# Patient Record
Sex: Female | Born: 1950 | Race: White | Hispanic: No | Marital: Single | Smoking: Former smoker
Health system: Southern US, Community
[De-identification: ages and names within clinical notes are randomized; demographics above are authoritative.]

## PROBLEM LIST (undated history)

## (undated) DIAGNOSIS — M1711 Unilateral primary osteoarthritis, right knee: Secondary | ICD-10-CM

## (undated) DIAGNOSIS — T7840XA Allergy, unspecified, initial encounter: Secondary | ICD-10-CM

## (undated) DIAGNOSIS — F329 Major depressive disorder, single episode, unspecified: Secondary | ICD-10-CM

## (undated) DIAGNOSIS — J45909 Unspecified asthma, uncomplicated: Secondary | ICD-10-CM

## (undated) DIAGNOSIS — E119 Type 2 diabetes mellitus without complications: Secondary | ICD-10-CM

## (undated) DIAGNOSIS — I739 Peripheral vascular disease, unspecified: Secondary | ICD-10-CM

## (undated) DIAGNOSIS — J449 Chronic obstructive pulmonary disease, unspecified: Secondary | ICD-10-CM

## (undated) DIAGNOSIS — M1990 Unspecified osteoarthritis, unspecified site: Secondary | ICD-10-CM

## (undated) DIAGNOSIS — I10 Essential (primary) hypertension: Secondary | ICD-10-CM

## (undated) DIAGNOSIS — J189 Pneumonia, unspecified organism: Secondary | ICD-10-CM

## (undated) DIAGNOSIS — F32A Depression, unspecified: Secondary | ICD-10-CM

## (undated) DIAGNOSIS — E785 Hyperlipidemia, unspecified: Secondary | ICD-10-CM

## (undated) HISTORY — DX: Essential (primary) hypertension: I10

## (undated) HISTORY — DX: Unspecified osteoarthritis, unspecified site: M19.90

## (undated) HISTORY — PX: TONSILLECTOMY: SUR1361

## (undated) HISTORY — DX: Peripheral vascular disease, unspecified: I73.9

## (undated) HISTORY — PX: OTHER SURGICAL HISTORY: SHX169

## (undated) HISTORY — DX: Type 2 diabetes mellitus without complications: E11.9

## (undated) HISTORY — DX: Depression, unspecified: F32.A

## (undated) HISTORY — PX: FRACTURE SURGERY: SHX138

## (undated) HISTORY — DX: Major depressive disorder, single episode, unspecified: F32.9

## (undated) HISTORY — DX: Allergy, unspecified, initial encounter: T78.40XA

## (undated) HISTORY — DX: Hyperlipidemia, unspecified: E78.5

## (undated) HISTORY — DX: Chronic obstructive pulmonary disease, unspecified: J44.9

---

## 1998-05-27 ENCOUNTER — Emergency Department (HOSPITAL_COMMUNITY): Admission: EM | Admit: 1998-05-27 | Discharge: 1998-05-27 | Payer: Self-pay | Admitting: Emergency Medicine

## 2000-02-29 ENCOUNTER — Emergency Department (HOSPITAL_COMMUNITY): Admission: EM | Admit: 2000-02-29 | Discharge: 2000-02-29 | Payer: Self-pay | Admitting: Emergency Medicine

## 2000-03-06 ENCOUNTER — Emergency Department (HOSPITAL_COMMUNITY): Admission: EM | Admit: 2000-03-06 | Discharge: 2000-03-06 | Payer: Self-pay | Admitting: Emergency Medicine

## 2000-03-16 ENCOUNTER — Emergency Department (HOSPITAL_COMMUNITY): Admission: EM | Admit: 2000-03-16 | Discharge: 2000-03-16 | Payer: Self-pay | Admitting: Emergency Medicine

## 2000-03-16 ENCOUNTER — Encounter: Payer: Self-pay | Admitting: Emergency Medicine

## 2003-10-03 ENCOUNTER — Emergency Department (HOSPITAL_COMMUNITY): Admission: EM | Admit: 2003-10-03 | Discharge: 2003-10-03 | Payer: Self-pay | Admitting: Emergency Medicine

## 2010-07-01 ENCOUNTER — Emergency Department (HOSPITAL_COMMUNITY)
Admission: EM | Admit: 2010-07-01 | Discharge: 2010-07-01 | Payer: Self-pay | Source: Home / Self Care | Admitting: Emergency Medicine

## 2014-03-19 ENCOUNTER — Emergency Department (HOSPITAL_COMMUNITY): Payer: BC Managed Care – PPO

## 2014-03-19 ENCOUNTER — Observation Stay (HOSPITAL_COMMUNITY)
Admission: EM | Admit: 2014-03-19 | Discharge: 2014-03-20 | Disposition: A | Discharge status: Home / Self Care | Payer: BC Managed Care – PPO | Attending: Internal Medicine | Admitting: Internal Medicine

## 2014-03-19 ENCOUNTER — Encounter (HOSPITAL_COMMUNITY): Payer: Self-pay | Admitting: Emergency Medicine

## 2014-03-19 DIAGNOSIS — Z720 Tobacco use: Secondary | ICD-10-CM

## 2014-03-19 DIAGNOSIS — T380X5A Adverse effect of glucocorticoids and synthetic analogues, initial encounter: Secondary | ICD-10-CM

## 2014-03-19 DIAGNOSIS — R0989 Other specified symptoms and signs involving the circulatory and respiratory systems: Secondary | ICD-10-CM | POA: Diagnosis present

## 2014-03-19 DIAGNOSIS — R0789 Other chest pain: Principal | ICD-10-CM | POA: Insufficient documentation

## 2014-03-19 DIAGNOSIS — Z880 Allergy status to penicillin: Secondary | ICD-10-CM | POA: Insufficient documentation

## 2014-03-19 DIAGNOSIS — R110 Nausea: Secondary | ICD-10-CM | POA: Insufficient documentation

## 2014-03-19 DIAGNOSIS — Z87891 Personal history of nicotine dependence: Secondary | ICD-10-CM | POA: Diagnosis present

## 2014-03-19 DIAGNOSIS — R61 Generalized hyperhidrosis: Secondary | ICD-10-CM | POA: Insufficient documentation

## 2014-03-19 DIAGNOSIS — R079 Chest pain, unspecified: Secondary | ICD-10-CM | POA: Diagnosis present

## 2014-03-19 DIAGNOSIS — R42 Dizziness and giddiness: Secondary | ICD-10-CM | POA: Insufficient documentation

## 2014-03-19 DIAGNOSIS — F17200 Nicotine dependence, unspecified, uncomplicated: Secondary | ICD-10-CM | POA: Insufficient documentation

## 2014-03-19 DIAGNOSIS — R739 Hyperglycemia, unspecified: Secondary | ICD-10-CM

## 2014-03-19 DIAGNOSIS — Z7982 Long term (current) use of aspirin: Secondary | ICD-10-CM | POA: Insufficient documentation

## 2014-03-19 DIAGNOSIS — R0602 Shortness of breath: Secondary | ICD-10-CM | POA: Insufficient documentation

## 2014-03-19 DIAGNOSIS — E785 Hyperlipidemia, unspecified: Secondary | ICD-10-CM | POA: Diagnosis present

## 2014-03-19 DIAGNOSIS — E118 Type 2 diabetes mellitus with unspecified complications: Secondary | ICD-10-CM | POA: Diagnosis present

## 2014-03-19 DIAGNOSIS — E099 Drug or chemical induced diabetes mellitus without complications: Secondary | ICD-10-CM | POA: Diagnosis present

## 2014-03-19 DIAGNOSIS — E1169 Type 2 diabetes mellitus with other specified complication: Secondary | ICD-10-CM | POA: Diagnosis not present

## 2014-03-19 LAB — I-STAT TROPONIN, ED: TROPONIN I, POC: 0.01 ng/mL (ref 0.00–0.08)

## 2014-03-19 LAB — CBC WITH DIFFERENTIAL/PLATELET
BASOS ABS: 0 10*3/uL (ref 0.0–0.1)
Basophils Relative: 0 % (ref 0–1)
EOS ABS: 0.1 10*3/uL (ref 0.0–0.7)
Eosinophils Relative: 2 % (ref 0–5)
HCT: 42.2 % (ref 36.0–46.0)
Hemoglobin: 13.3 g/dL (ref 12.0–15.0)
LYMPHS ABS: 1.4 10*3/uL (ref 0.7–4.0)
Lymphocytes Relative: 28 % (ref 12–46)
MCH: 27 pg (ref 26.0–34.0)
MCHC: 31.5 g/dL (ref 30.0–36.0)
MCV: 85.8 fL (ref 78.0–100.0)
Monocytes Absolute: 0.3 10*3/uL (ref 0.1–1.0)
Monocytes Relative: 6 % (ref 3–12)
Neutro Abs: 3.3 10*3/uL (ref 1.7–7.7)
Neutrophils Relative %: 64 % (ref 43–77)
PLATELETS: 279 10*3/uL (ref 150–400)
RBC: 4.92 MIL/uL (ref 3.87–5.11)
RDW: 14.7 % (ref 11.5–15.5)
WBC: 5.1 10*3/uL (ref 4.0–10.5)

## 2014-03-19 LAB — BASIC METABOLIC PANEL
Anion gap: 13 (ref 5–15)
BUN: 7 mg/dL (ref 6–23)
CALCIUM: 9.2 mg/dL (ref 8.4–10.5)
CO2: 29 mEq/L (ref 19–32)
Chloride: 100 mEq/L (ref 96–112)
Creatinine, Ser: 0.59 mg/dL (ref 0.50–1.10)
GFR calc Af Amer: >90 mL/min (ref >90)
GLUCOSE: 109 mg/dL — AB (ref 70–99)
Potassium: 4.7 mEq/L (ref 3.7–5.3)
Sodium: 142 mEq/L (ref 137–147)

## 2014-03-19 LAB — MAGNESIUM: Magnesium: 2.2 mg/dL (ref 1.5–2.5)

## 2014-03-19 LAB — LIPID PANEL
CHOL/HDL RATIO: 5.4 ratio
CHOLESTEROL: 258 mg/dL — AB (ref 0–200)
HDL: 48 mg/dL (ref >39)
LDL Cholesterol: 187 mg/dL — ABNORMAL HIGH (ref 0–99)
Triglycerides: 116 mg/dL (ref <150)
VLDL: 23 mg/dL (ref 0–40)

## 2014-03-19 LAB — PHOSPHORUS: Phosphorus: 4.4 mg/dL (ref 2.3–4.6)

## 2014-03-19 LAB — CREATININE, SERUM
Creatinine, Ser: 0.62 mg/dL (ref 0.50–1.10)
GFR calc Af Amer: >90 mL/min (ref >90)
GFR calc non Af Amer: >90 mL/min (ref >90)

## 2014-03-19 LAB — TROPONIN I: Troponin I: <0.3 ng/mL (ref <0.30)

## 2014-03-19 LAB — TSH: TSH: 0.688 u[IU]/mL (ref 0.350–4.500)

## 2014-03-19 MED ORDER — ONDANSETRON HCL 4 MG/2ML IJ SOLN: 4.0000 mg | Freq: Four times a day (QID) | INTRAMUSCULAR | Status: DC | PRN

## 2014-03-19 MED ORDER — MORPHINE SULFATE 2 MG/ML IJ SOLN: 2.0000 mg | INTRAMUSCULAR | Status: DC | PRN

## 2014-03-19 MED ORDER — ASPIRIN EC 81 MG PO TBEC
81.0000 mg | DELAYED_RELEASE_TABLET | Freq: Every day | ORAL | Status: DC
  Administered 2014-03-20: 81 mg via ORAL
  Filled 2014-03-19: qty 1

## 2014-03-19 MED ORDER — ENOXAPARIN SODIUM 40 MG/0.4ML ~~LOC~~ SOLN
40.0000 mg | SUBCUTANEOUS | Status: DC
  Administered 2014-03-19: 40 mg via SUBCUTANEOUS
  Filled 2014-03-19 (×2): qty 0.4

## 2014-03-19 MED ORDER — ONDANSETRON HCL 4 MG/2ML IJ SOLN
4.0000 mg | Freq: Once | INTRAMUSCULAR | Status: AC
  Administered 2014-03-19: 4 mg via INTRAVENOUS
  Filled 2014-03-19: qty 2

## 2014-03-19 MED ORDER — ACETAMINOPHEN 325 MG PO TABS: 650.0000 mg | ORAL_TABLET | ORAL | Status: DC | PRN

## 2014-03-19 MED ORDER — NITROGLYCERIN 0.4 MG SL SUBL
0.4000 mg | SUBLINGUAL_TABLET | SUBLINGUAL | Status: DC | PRN
Start: 2014-03-19 — End: 2014-03-20
  Administered 2014-03-19: 0.4 mg via SUBLINGUAL
  Filled 2014-03-19: qty 1

## 2014-03-19 NOTE — Progress Notes (Signed)
Pt reported pain under her left breast. Rates it a 5 on 0/10 pain scale. 1 SL NTG given. Reports pain resolved.

## 2014-03-19 NOTE — ED Notes (Signed)
To room via EMS. Onset 1pm chest pain underneath left breast, no radiation.  Pt took ASA 325mg  @ 1pm and 325mg  @ 1:30pm.  NTG x 1 given en route pain down to 1/10.  BP 182/98 HR 70, RR 14.

## 2014-03-19 NOTE — Progress Notes (Signed)
Utilization Review Completed.Yvonne Todd T7/19/2015  

## 2014-03-19 NOTE — ED Notes (Signed)
Patient transported to X-ray 

## 2014-03-19 NOTE — H&P (Signed)
[facility] History and Physical  Yvonne LandLynda I Bohan ZOX:[id number] DOB: 07-07-1951 DOA: 03/19/2014  Referring physician:  PCP: No primary provider on file.  Specialists:   Chief Complaint: Chest pain  HPI: Yvonne Todd is a 63 y.o. female  With no past medical history, tobacco abuse that presents emergency department with complaints of chest pain. Patient has not seen in physician in quite some time. She does have an appointment set with one in November. Patient began having chest pain approximately 1 PM today which was associated with some shortness of breath, dizziness, diaphoresis, nausea. Patient states she also felt somewhat dizzy. The pain was sharp and in the left side of her chest which all radiated to her left shoulder blade. Patient states that she was smoking a cigarette when this started. Patient was given aspirin which helped alleviate the pain. Patient also had taken nitroglycerin which also helped alleviate her pain. Patient currently denies any chest pain at this time. She does state she has a family history, her mother had coronary artery disease and has had several stents placed. Patient denies any recent illness, ill contacts, recent travel. Patient does wear a right knee brace to to chronic right knee pain, she has had surgery in that knee in the 1990s.  Review of Systems:  Constitutional: Denies fever, chills, diaphoresis, appetite change and fatigue.  HEENT: Denies photophobia, eye pain, redness, hearing loss, ear pain, congestion, sore throat, rhinorrhea, sneezing, mouth sores, trouble swallowing, neck pain, neck stiffness and tinnitus.   Respiratory: Complains of shortness of breath associated with her chest pain. Cardiovascular: Compared to chest pain, see history of present illness. Gastrointestinal: Experienced nausea associated with her chest pain. Genitourinary: Denies dysuria, urgency, frequency, hematuria, flank pain and difficulty urinating.  Musculoskeletal:  Denies myalgias, back pain, joint swelling, arthralgias and gait problem.  Skin: Denies pallor, rash and wound.  Neurological: Denies dizziness, seizures, syncope, weakness, light-headedness, numbness and headaches.  Hematological: Denies adenopathy. Easy bruising, personal or family bleeding history  Psychiatric/Behavioral: Denies suicidal ideation, mood changes, confusion, nervousness, sleep disturbance and agitation  No past medical history on file. No past surgical history on file. Social History:  reports that she has been smoking.  She does not have any smokeless tobacco history on file. She reports that she drinks alcohol. She reports that she does not use illicit drugs. Patient currently lives at home with her mother who may have dementia which causes a stressful situation for her. She is currently retired. Patient has been smoking since the age of 63, is currently smokes half a pack per day.  Allergies  Allergen Reactions  . Iodine Rash  . Penicillins Rash  . Tetracyclines & Related Rash   Family history Mother has diabetes as well as coronary artery disease, first heart attack was in her 2360s.  Prior to Admission medications   Medication Sig Start Date End Date Taking? Authorizing Provider  aspirin EC 81 MG tablet Take 81 mg by mouth daily.   Yes Historical Provider, MD  dextromethorphan-guaiFENesin (MUCINEX DM) 30-600 MG per 12 hr tablet Take 1 tablet by mouth 2 (two) times daily as needed for cough.   Yes Historical Provider, MD  diphenhydrAMINE (BENADRYL) 25 mg capsule Take 25 mg by mouth at bedtime as needed for allergies or sleep.   Yes Historical Provider, MD   Physical Exam: Filed Vitals:   03/19/14 1545  BP: 136/55  Pulse: 59  Temp:   Resp: 24     General: Well developed, well  nourished, NAD, appears stated age  HEENT: NCAT, PERRLA, EOMI, Anicteic Sclera, mucous membranes moist.   Neck: Supple, no JVD, no masses  Cardiovascular: S1 S2 auscultated, no rubs,  murmurs or gallops. Regular rate and rhythm.  Respiratory: Clear to auscultation bilaterally with equal chest rise  Abdomen: Soft, obese, nontender, nondistended, + bowel sounds  Extremities: warm dry without cyanosis clubbing or edema  Neuro: AAOx3, cranial nerves grossly intact. Strength 5/5 in patient's upper and lower extremities bilaterally  Skin: Without rashes exudates or nodules  Psych: Normal affect and demeanor with intact judgement and insight  Labs on Admission:  Basic Metabolic Panel:  Recent Labs Lab 03/19/14 1540  NA 142  K 4.7  CL 100  CO2 29  GLUCOSE 109*  BUN 7  CREATININE 0.59  CALCIUM 9.2   Liver Function Tests: No results found for this basename: AST, ALT, ALKPHOS, BILITOT, PROT, ALBUMIN,  in the last 168 hours No results found for this basename: LIPASE, AMYLASE,  in the last 168 hours No results found for this basename: AMMONIA,  in the last 168 hours CBC:  Recent Labs Lab 03/19/14 1540  WBC 5.1  NEUTROABS 3.3  HGB 13.3  HCT 42.2  MCV 85.8  PLT 279   Cardiac Enzymes: No results found for this basename: CKTOTAL, CKMB, CKMBINDEX, TROPONINI,  in the last 168 hours  BNP (last 3 results) No results found for this basename: PROBNP,  in the last 8760 hours CBG: No results found for this basename: GLUCAP,  in the last 168 hours  Radiological Exams on Admission: Dg Chest 2 View  03/19/2014   CLINICAL DATA:  Chest pain, shortness of breath.  EXAM: CHEST  2 VIEW  COMPARISON:  None.  FINDINGS: The heart size and mediastinal contours are within normal limits. Both lungs are clear. No pneumothorax or pleural effusion is noted. The visualized skeletal structures are unremarkable.  IMPRESSION: No acute cardiopulmonary abnormality seen.   Electronically Signed   By: Roque Lias M.D.   On: 03/19/2014 16:13    EKG: Independently reviewed. Sinus rhythm, rate 67, questionable Q waves in anterior leads; second EKG shows rate of 62 sinus rhythm, with minimal  ST elevation in the inferior leads, no order EKG for comparison  Assessment/Plan Chest pain -Patient will be admitted for observation to telemetry unit. -Given her age and tobacco abuse and family history, patient is increased risk for coronary artery disease  -EKG shows sinus rhythm with a rate of 67, Q waves in anterior leads, however no old EKG for comparison. Repeat EKG showed some minimal ST elevation in anterior leads -Point-of-care troponin 0.01; will continue to cycle troponins every 6 hours. -Will obtain a lipid panel, hemoglobin A1c, TSH, magnesium, phosphate level -Patient would likely benefit from stress test. Will consult cardiology for further recommendations. -Will place patient on heart healthy diet and make her n.p.o. after midnight  Tobacco abuse -Smoking cessation counseling given.  DVT prophylaxis: Lovenox  Code Status: Full  Condition: Guarded   Family Communication: Family at bedside. Admission, patients condition and plan of care including tests being ordered have been discussed with the patient and family who indicate understanding and agree with the plan and Code Status.  Disposition Plan: Admitted for observation  Time spent: 45 minutes  Shandon Matson D.O. [facility] Pager [phone]  If 7PM-7AM, please contact night-coverage [email or web address] Password [facility] 03/19/2014, 5:53 PM

## 2014-03-19 NOTE — ED Provider Notes (Signed)
TIME SEEN: 3:25 PM  CHIEF COMPLAINT: Left-sided chest pain, shortness of breath, diaphoresis  HPI: Patient is a 63 year old female with history of tobacco use and no other known past medical history but states she does not see a doctor and has not seen one in many years who presents emergency department with episode of left-sided chest pressure without radiation that started at 1 PM with associated shortness of breath, dizziness, nausea, diaphoresis. This happened at rest while smoking a cigarette. She received nitroglycerin with EMS and states her chest pain is now gone. She took 225 mg aspirins at home prior to EMS arrival. She denies a prior history of stress test or cardiac catheterization. She does have a family history of a mother with coronary artery disease. No history of fevers, chills, cough, lower extremity swelling or pain. History of PE or DVT. No recent prolonged immobilization such as long flight or hospitalization but she does wear a knee brace on her right knee chronically. No recent fracture, surgery, trauma. She is not on exogenous estrogens.  ROS: See HPI Constitutional: no fever  Eyes: no drainage  ENT: no runny nose   Cardiovascular:   chest pain  Resp:  SOB  GI: no vomiting GU: no dysuria Integumentary: no rash  Allergy: no hives  Musculoskeletal: no leg swelling  Neurological: no slurred speech ROS otherwise negative  PAST MEDICAL HISTORY/PAST SURGICAL HISTORY:  No past medical history on file.  MEDICATIONS:  Prior to Admission medications   Medication Sig Start Date End Date Taking? Authorizing Provider  aspirin EC 81 MG tablet Take 81 mg by mouth daily.   Yes Historical Provider, MD  dextromethorphan-guaiFENesin (MUCINEX DM) 30-600 MG per 12 hr tablet Take 1 tablet by mouth 2 (two) times daily as needed for cough.   Yes Historical Provider, MD  diphenhydrAMINE (BENADRYL) 25 mg capsule Take 25 mg by mouth at bedtime as needed for allergies or sleep.   Yes  Historical Provider, MD    ALLERGIES:  Allergies  Allergen Reactions  . Iodine Rash  . Penicillins Rash  . Tetracyclines & Related Rash    SOCIAL HISTORY:  History  Substance Use Topics  . Smoking status: Current Every Day Smoker -- 0.50 packs/day  . Smokeless tobacco: Not on file  . Alcohol Use: Yes     Comment: occ     FAMILY HISTORY: No family history on file.  EXAM: BP 118/59  Pulse 67  Temp(Src) 98.7 F (37.1 C) (Oral)  Resp 20  SpO2 93% CONSTITUTIONAL: Alert and oriented and responds appropriately to questions. Well-appearing; well-nourished HEAD: Normocephalic EYES: Conjunctivae clear, PERRL ENT: normal nose; no rhinorrhea; moist mucous membranes; pharynx without lesions noted NECK: Supple, no meningismus, no LAD  CARD: RRR; S1 and S2 appreciated; no murmurs, no clicks, no rubs, no gallops RESP: Normal chest excursion without splinting or tachypnea; breath sounds clear and equal bilaterally; no wheezes, no rhonchi, no rales, no hypoxia or respiratory distress ABD/GI: Normal bowel sounds; non-distended; soft, non-tender, no rebound, no guarding BACK:  The back appears normal and is non-tender to palpation, there is no CVA tenderness EXT: Normal ROM in all joints; non-tender to palpation; no edema; normal capillary refill; no cyanosis    SKIN: Normal color for age and race; warm NEURO: Moves all extremities equally PSYCH: The patient's mood and manner are appropriate. Grooming and personal hygiene are appropriate.  MEDICAL DECISION MAKING: Patient here with chest pain, shortness of breath, nausea, diaphoresis and dizziness. Pain resolved after nitroglycerin. She  denies a history of cardiac disease but states she has not seen primary doctor in many years. She does have a family history of CAD and is a smoker. Given her concerning story, I am concerned possible ACS. EKG shows Q waves in anterior leads no old EKG for comparison. We'll obtain cardiac labs, chest x-ray.  She does have risk factors for pulmonary emboli given her age, tobacco use and history of having a chronic brace on her right knee but given she is chest pain-free at this time with no shortness of breath, and feel this is less likely. Again less likely dissection given her pain is controlled. Anticipate admission for ACS rule out.  ED PROGRESS: Patient is having some mild return of her chest pain. No significant change in her EKG. We'll repeat nitroglycerin. Discussed with Dr. Catha Gosselin with hospitalist service for admission to telemetry, observation for ACS rule out.    Date: 03/19/2014 14:49  Rate: 67  Rhythm: normal sinus rhythm  QRS Axis: normal  Intervals: normal  ST/T Wave abnormalities: normal  Conduction Disutrbances: none  Narrative Interpretation: unremarkable; baseline wander in the inferior leads, Q waves in anterior leads with no old for comparison     Date: 03/19/2014 17:19  Rate: 62  Rhythm: normal sinus rhythm  QRS Axis: normal  Intervals: normal  ST/T Wave abnormalities: normal  Conduction Disutrbances: none  Narrative Interpretation: unremarkable; very minimal ST elevation in inferior leads with no old for comparison, no reciprocal changes      Layla Maw Ciarra Braddy, DO 03/19/14 1722

## 2014-03-20 ENCOUNTER — Observation Stay (HOSPITAL_COMMUNITY): Payer: BC Managed Care – PPO

## 2014-03-20 DIAGNOSIS — R072 Precordial pain: Secondary | ICD-10-CM

## 2014-03-20 DIAGNOSIS — R0989 Other specified symptoms and signs involving the circulatory and respiratory systems: Secondary | ICD-10-CM

## 2014-03-20 DIAGNOSIS — T380X5A Adverse effect of glucocorticoids and synthetic analogues, initial encounter: Secondary | ICD-10-CM

## 2014-03-20 DIAGNOSIS — I519 Heart disease, unspecified: Secondary | ICD-10-CM

## 2014-03-20 DIAGNOSIS — E099 Drug or chemical induced diabetes mellitus without complications: Secondary | ICD-10-CM | POA: Diagnosis present

## 2014-03-20 DIAGNOSIS — Z87891 Personal history of nicotine dependence: Secondary | ICD-10-CM | POA: Diagnosis present

## 2014-03-20 DIAGNOSIS — E1169 Type 2 diabetes mellitus with other specified complication: Secondary | ICD-10-CM | POA: Diagnosis not present

## 2014-03-20 DIAGNOSIS — E118 Type 2 diabetes mellitus with unspecified complications: Secondary | ICD-10-CM | POA: Diagnosis present

## 2014-03-20 DIAGNOSIS — R079 Chest pain, unspecified: Secondary | ICD-10-CM

## 2014-03-20 DIAGNOSIS — Z720 Tobacco use: Secondary | ICD-10-CM

## 2014-03-20 DIAGNOSIS — R739 Hyperglycemia, unspecified: Secondary | ICD-10-CM

## 2014-03-20 DIAGNOSIS — E785 Hyperlipidemia, unspecified: Secondary | ICD-10-CM

## 2014-03-20 LAB — TROPONIN I: Troponin I: <0.3 ng/mL (ref <0.30)

## 2014-03-20 LAB — BASIC METABOLIC PANEL
Anion gap: 10 (ref 5–15)
BUN: 9 mg/dL (ref 6–23)
CALCIUM: 8.8 mg/dL (ref 8.4–10.5)
CO2: 32 meq/L (ref 19–32)
CREATININE: 0.67 mg/dL (ref 0.50–1.10)
Chloride: 101 mEq/L (ref 96–112)
GFR calc Af Amer: >90 mL/min (ref >90)
GFR calc non Af Amer: >90 mL/min (ref >90)
Glucose, Bld: 115 mg/dL — ABNORMAL HIGH (ref 70–99)
Potassium: 4.4 mEq/L (ref 3.7–5.3)
Sodium: 143 mEq/L (ref 137–147)

## 2014-03-20 LAB — CBC
HEMATOCRIT: 38.7 % (ref 36.0–46.0)
Hemoglobin: 12.1 g/dL (ref 12.0–15.0)
MCH: 27.1 pg (ref 26.0–34.0)
MCHC: 31.3 g/dL (ref 30.0–36.0)
MCV: 86.6 fL (ref 78.0–100.0)
Platelets: 254 10*3/uL (ref 150–400)
RBC: 4.47 MIL/uL (ref 3.87–5.11)
RDW: 14.7 % (ref 11.5–15.5)
WBC: 6.4 10*3/uL (ref 4.0–10.5)

## 2014-03-20 LAB — HEMOGLOBIN A1C
Hgb A1c MFr Bld: 6.3 % — ABNORMAL HIGH (ref <5.7)
MEAN PLASMA GLUCOSE: 134 mg/dL — AB (ref <117)

## 2014-03-20 MED ORDER — REGADENOSON 0.4 MG/5ML IV SOLN
INTRAVENOUS | Status: AC
Start: 2014-03-20 — End: 2014-03-20
  Administered 2014-03-20: 0.4 mg via INTRAVENOUS
  Filled 2014-03-20: qty 5

## 2014-03-20 MED ORDER — NITROGLYCERIN 0.4 MG SL SUBL: 0.4000 mg | SUBLINGUAL_TABLET | SUBLINGUAL | Status: DC | PRN

## 2014-03-20 MED ORDER — TECHNETIUM TC 99M SESTAMIBI GENERIC - CARDIOLITE
10.0000 | Freq: Once | INTRAVENOUS | Status: AC | PRN
  Administered 2014-03-20: 10 via INTRAVENOUS

## 2014-03-20 MED ORDER — REGADENOSON 0.4 MG/5ML IV SOLN
0.4000 mg | Freq: Once | INTRAVENOUS | Status: AC
  Administered 2014-03-20: 0.4 mg via INTRAVENOUS
  Filled 2014-03-20: qty 5

## 2014-03-20 MED ORDER — TECHNETIUM TC 99M SESTAMIBI GENERIC - CARDIOLITE
30.0000 | Freq: Once | INTRAVENOUS | Status: AC | PRN
  Administered 2014-03-20: 30 via INTRAVENOUS

## 2014-03-20 MED ORDER — LIVING WELL WITH DIABETES BOOK
Freq: Once | Status: DC
  Filled 2014-03-20: qty 1

## 2014-03-20 MED ORDER — SIMVASTATIN 20 MG PO TABS: 20.0000 mg | ORAL_TABLET | Freq: Every day | ORAL | Status: DC

## 2014-03-20 MED ORDER — SIMVASTATIN 20 MG PO TABS
20.0000 mg | ORAL_TABLET | Freq: Every day | ORAL | Status: DC
  Filled 2014-03-20: qty 1

## 2014-03-20 MED ORDER — AMLODIPINE BESYLATE 2.5 MG PO TABS: 2.5000 mg | ORAL_TABLET | Freq: Every day | ORAL | Status: DC

## 2014-03-20 NOTE — Progress Notes (Signed)
VASCULAR LAB PRELIMINARY  PRELIMINARY  PRELIMINARY  PRELIMINARY  Carotid duplex  completed.    Preliminary report:  Bilateral:  1-39% ICA stenosis.  Vertebral artery flow is antegrade.      Saleh Ulbrich, RVT 03/20/2014, 10:07 AM

## 2014-03-20 NOTE — Progress Notes (Signed)
The patient tolerated the lexiscan myovue well.  Interpretation to follow  Bethzy Hauck, PA-c

## 2014-03-20 NOTE — Progress Notes (Signed)
Patient does not want to walk on treadmill as she has a bad knee.

## 2014-03-20 NOTE — Progress Notes (Signed)
Echo Lab  2D Echocardiogram completed.  Ilamae Geng L Cecil Bixby, RDCS 03/20/2014 10:16 AM

## 2014-03-20 NOTE — Progress Notes (Signed)
Utilization review completed.  

## 2014-03-20 NOTE — Consult Note (Addendum)
Admit date: 03/19/2014 Referring Physician  Dr. Isidoro Donning Primary Physician  none Primary Cardiologist  none Reason for Consultation  Chest pain  HPI: Yvonne Todd is a 63 y.o. female with no past medical history except for tobacco abuse who presented to the  emergency department with complaints of chest pain. Patient has not seen a physician in quite some time. She does have an appointment set with one in November. Patient began having chest pain approximately 1 PM yesterday which was associated with some shortness of breath, dizziness, diaphoresis, nausea. Patient states she also felt somewhat dizzy. The pain was sharp and in the left side of her chest and radiated to her left shoulder blade. Patient states that she was smoking a cigarette when this started. Patient was given aspirin which helped alleviate the pain. Patient also had taken nitroglycerin which also helped alleviate her pain. Patient currently denies any chest pain at this time. She does state she has a family history, her mother had coronary artery disease and has had several stents placed. Patient denies any recent illness, ill contacts, recent travel. Cardiology is now asked to consult for evaluation of chest pain.   PMH:  History reviewed. No pertinent past medical history.   PSH:   Past Surgical History  Procedure Laterality Date  . Tonsillectomy    . Right knee surgery      Allergies:  Iodine; Penicillins; and Tetracyclines & related Prior to Admit Meds:   Prescriptions prior to admission  Medication Sig Dispense Refill  . aspirin EC 81 MG tablet Take 81 mg by mouth daily.      Marland Kitchen dextromethorphan-guaiFENesin (MUCINEX DM) 30-600 MG per 12 hr tablet Take 1 tablet by mouth 2 (two) times daily as needed for cough.      . diphenhydrAMINE (BENADRYL) 25 mg capsule Take 25 mg by mouth at bedtime as needed for allergies or sleep.       Fam HX:    Family History  Problem Relation Age of Onset  . Diabetes Mother   . Coronary artery  disease Mother    Social HX:    History   Social History  . Marital Status: Single    Spouse Name: N/A    Number of Children: N/A  . Years of Education: N/A   Occupational History  . Not on file.   Social History Main Topics  . Smoking status: Current Every Day Smoker -- 0.50 packs/day  . Smokeless tobacco: Not on file  . Alcohol Use: Yes     Comment: occ   . Drug Use: No  . Sexual Activity: Not on file   Other Topics Concern  . Not on file   Social History Narrative  . No narrative on file     ROS:  All 11 ROS were addressed and are negative except what is stated in the HPI  Physical Exam: Blood pressure 137/55, pulse 70, temperature 98 F (36.7 C), temperature source Oral, resp. rate 20, height 5\' 2"  (1.575 m), weight 176 lb 12.8 oz (80.196 kg), SpO2 90.00%.    General: Well developed, well nourished, in no acute distress Head: Eyes PERRLA, No xanthomas.   Normal cephalic and atramatic  Lungs:   Scattered expiratory wheezes Heart:   HRRR S1 S2 Pulses are 2+ & equal.            left carotid bruit present. No JVD.  No abdominal bruits. No femoral bruits. Abdomen: Bowel sounds are positive, abdomen soft and non-tender without  masses Extremities:   No clubbing, cyanosis or edema.  DP +1 Neuro: Alert and oriented X 3. Psych:  Good affect, responds appropriately    Labs:   Lab Results  Component Value Date   WBC 6.4 03/20/2014   HGB 12.1 03/20/2014   HCT 38.7 03/20/2014   MCV 86.6 03/20/2014   PLT 254 03/20/2014    Recent Labs Lab 03/20/14 0012  NA 143  K 4.4  CL 101  CO2 32  BUN 9  CREATININE 0.67  CALCIUM 8.8  GLUCOSE 115*   No results found for this basename: PTT   No results found for this basename: INR, PROTIME   Lab Results  Component Value Date   TROPONINI <0.30 03/20/2014     Lab Results  Component Value Date   CHOL 258* 03/19/2014   Lab Results  Component Value Date   HDL 48 03/19/2014   Lab Results  Component Value Date   LDLCALC  187* 03/19/2014   Lab Results  Component Value Date   TRIG 116 03/19/2014   Lab Results  Component Value Date   CHOLHDL 5.4 03/19/2014   No results found for this basename: LDLDIRECT      Radiology:  Dg Chest 2 View  03/19/2014   CLINICAL DATA:  Chest pain, shortness of breath.  EXAM: CHEST  2 VIEW  COMPARISON:  None.  FINDINGS: The heart size and mediastinal contours are within normal limits. Both lungs are clear. No pneumothorax or pleural effusion is noted. The visualized skeletal structures are unremarkable.  IMPRESSION: No acute cardiopulmonary abnormality seen.   Electronically Signed   By: Roque LiasJames  Green M.D.   On: 03/19/2014 16:13    EKG:  No EKG in system  ASSESSMENT:  1.  Chest pain syndrome with negative cardiac enzymes x 3 but multiple CRFs including family history of CAD, smoking, dyslipidemia with very elevated LDL.  No EKG in system to review.   2.  Dyslipidemia with very elevated LDL 3.  Tobacco abuse 4.  Left carotid artery bruit  PLAN:   1.  NPO 2.  Stress myoview to rule out ischemia 3.  Carotid dopplers to evaluate bruit 4.  Smoking cessation counselling 5.  Check 2D echo to eval LVF  Quintella ReichertURNER,Yvonne Aldredge R, MD  03/20/2014  8:09 AM

## 2014-03-20 NOTE — Discharge Summary (Signed)
Physician Discharge Summary  Patient ID: Yvonne Todd MRN: [id number] DOB/AGE: [birth year] 63 y.o.  Admit date: 03/19/2014 Discharge date: 03/20/2014  Primary Care Physician:  Yvonne MechKOLLAR, ELIZABETH, MD  Discharge Diagnoses:    . Chest pain . Tobacco use disorder . Other and unspecified hyperlipidemia . Left carotid bruit . Type II or unspecified type diabetes mellitus- new diagnosis  Consults: Cardiology   Recommendations for Outpatient Follow-up:  Patient reports that she has an appointment with her PCP in November, recommended to followup sooner as hospital followup.  Allergies:   Allergies  Allergen Reactions  . Iodine Rash  . Penicillins Rash  . Tetracyclines & Related Rash     Discharge Medications:   Medication List         amLODipine 2.5 MG tablet  Commonly known as:  NORVASC  Take 1 tablet (2.5 mg total) by mouth daily.     aspirin EC 81 MG tablet  Take 81 mg by mouth daily.     dextromethorphan-guaiFENesin 30-600 MG per 12 hr tablet  Commonly known as:  MUCINEX DM  Take 1 tablet by mouth 2 (two) times daily as needed for cough.     diphenhydrAMINE 25 mg capsule  Commonly known as:  BENADRYL  Take 25 mg by mouth at bedtime as needed for allergies or sleep.     nitroGLYCERIN 0.4 MG SL tablet  Commonly known as:  NITROSTAT  Place 1 tablet (0.4 mg total) under the tongue every 5 (five) minutes as needed for chest pain.     simvastatin 20 MG tablet  Commonly known as:  ZOCOR  Take 1 tablet (20 mg total) by mouth at bedtime.         Brief H and P: For complete details please refer to admission H and P, but in brief Yvonne Todd is a 63 y.o. female  With no past medical history, tobacco abuse that presents emergency department with complaints of chest pain. Patient has not seen in physician in quite some time. She does have an appointment set with one in November. Patient began having chest pain approximately 1 PM today which was associated with some  shortness of breath, dizziness, diaphoresis, nausea. Patient states she also felt somewhat dizzy. The pain was sharp and in the left side of her chest which all radiated to her left shoulder blade. Patient reported that she was smoking a cigarette when this started. Patient was given aspirin which helped alleviate the pain. Patient also had taken nitroglycerin which also helped alleviate her pain.  She does state she has a family history, her mother had coronary artery disease and has had several stents placed. Patient denies any recent illness, ill contacts, recent travel. Patient does wear a right knee brace to to chronic right knee pain, she has had surgery in that knee in the 1990s   Hospital Course:  Chest pain - Currently resolved, ruled out for acute ACS. cardiac enzymes x3 negative, risk factors include family history, smoking, dyslipidemia. No prior cardiac workup.  Cardiology was consulted and recommended stress myoview rule out any ischemia, 2-D echocardiogram to evaluate LVEF. 2-D echo showed EF of 55-60%, grade 1 diastolic dysfunction, no regional wall motion abnormalities.   Stress test showed low risk myocardial perfusion imaging study, EF 68%  Tobacco use disorder - smoking cessation counseled   Other and unspecified hyperlipidemia, Dyslipidemia  - Lipid panel showed cholesterol of 258, LDL 187   Diabetes mellitus, new diagnosis  - Hemoglobin A1c 6.3, in  diabetes range, CBGs controlled.   Patient was provided education by nutritionist and diabetic coordinator.   Left carotid bruit -Carotid Dopplers showed 1-39% ICA stenosis bilaterally    Day of Discharge BP 128/41  Pulse 71  Temp(Src) 97.4 F (36.3 C) (Oral)  Resp 15  Ht 5\' 2"  (1.575 m)  Wt 80.196 kg (176 lb 12.8 oz)  BMI 32.33 kg/m2  SpO2 91%  Physical Exam: General: Alert and awake oriented x3 not in any acute distress. HEENT: anicteric sclera, pupils reactive to light and accommodation CVS: S1-S2 clear no murmur  rubs or gallops Chest: clear to auscultation bilaterally, no wheezing rales or rhonchi Abdomen: soft nontender, nondistended, normal bowel sounds Extremities: no cyanosis, clubbing or edema noted bilaterally Neuro: Cranial nerves II-XII intact, no focal neurological deficits   The results of significant diagnostics from this hospitalization (including imaging, microbiology, ancillary and laboratory) are listed below for reference.    LAB RESULTS: Basic Metabolic Panel:  Recent Labs Lab 03/19/14 1540 03/19/14 1853 03/20/14 0012  NA 142  --  143  K 4.7  --  4.4  CL 100  --  101  CO2 29  --  32  GLUCOSE 109*  --  115*  BUN 7  --  9  CREATININE 0.59 0.62 0.67  CALCIUM 9.2  --  8.8  MG  --  2.2  --   PHOS  --  4.4  --    Liver Function Tests: No results found for this basename: AST, ALT, ALKPHOS, BILITOT, PROT, ALBUMIN,  in the last 168 hours No results found for this basename: LIPASE, AMYLASE,  in the last 168 hours No results found for this basename: AMMONIA,  in the last 168 hours CBC:  Recent Labs Lab 03/19/14 1540 03/20/14 0012  WBC 5.1 6.4  NEUTROABS 3.3  --   HGB 13.3 12.1  HCT 42.2 38.7  MCV 85.8 86.6  PLT 279 254   Cardiac Enzymes:  Recent Labs Lab 03/20/14 0002 03/20/14 0655  TROPONINI <0.30 <0.30   BNP: No components found with this basename: POCBNP,  CBG: No results found for this basename: GLUCAP,  in the last 168 hours  Significant Diagnostic Studies:  Dg Chest 2 View  03/19/2014   CLINICAL DATA:  Chest pain, shortness of breath.  EXAM: CHEST  2 VIEW  COMPARISON:  None.  FINDINGS: The heart size and mediastinal contours are within normal limits. Both lungs are clear. No pneumothorax or pleural effusion is noted. The visualized skeletal structures are unremarkable.  IMPRESSION: No acute cardiopulmonary abnormality seen.   Electronically Signed   By: Roque Lias M.D.   On: 03/19/2014 16:13   Nm Myocar Multi W/spect W/wall Motion / Ef  03/20/2014    CLINICAL DATA:  Yvonne Todd is a 63 y.o. female with no past medical history, tobacco abuse that presents emergency department with complaints of chest pain.  EXAM: MYOCARDIAL IMAGING WITH SPECT (REST AND PHARMACOLOGIC-STRESS)  GATED LEFT VENTRICULAR WALL MOTION STUDY  LEFT VENTRICULAR EJECTION FRACTION  TECHNIQUE: Standard myocardial SPECT imaging was performed after resting intravenous injection of 10 mCi Tc-80m sestamibi. Subsequently, intravenous infusion of Lexiscan was performed under the supervision of the Cardiology staff. At peak effect of the drug, 30 mCi Tc-68m sestamibi was injected intravenously and standard myocardial SPECT imaging was performed. Quantitative gated imaging was also performed to evaluate left ventricular wall motion, and estimate left ventricular ejection fraction.  COMPARISON:  None.  FINDINGS: Baseline ECG shows NSR. No ECG changes  are seen during Lexiscan infusion. There is mild anterior breast attenuation artifact, but otherwise stress and rest perfusion images are normal. Normal regional wall motion. Overall LV EF is 68%.  IMPRESSION: Low risk myocardial perfusion imaging study with mild breast attenuation artifact.   Electronically Signed   By: Thurmon Fair   On: 03/20/2014 14:23    2D ECHO: Study Conclusions  - Left ventricle: The cavity size was normal. Systolic function was normal. The estimated ejection fraction was in the range of 55% to 60%. Wall motion was normal; there were no regional wall motion abnormalities. There was an increased relative contribution of atrial contraction to ventricular filling. Doppler parameters are consistent with abnormal left ventricular relaxation (grade 1 diastolic dysfunction). - Aortic valve: Mild thickening and calcification, consistent with sclerosis. There was trivial regurgitation. - Mitral valve: There was trivial regurgitation.     Disposition and Follow-up:    DISPOSITION: Home  DIET: Carb modified  diet   DISCHARGE FOLLOW-UP Follow-up Information   Schedule an appointment as soon as possible for a visit in 2 weeks to follow up. (for hospital follow-up)    Contact information:   please follow with your primary physician      Time spent on Discharge: 40 minutes  Signed:   Sanskriti Greenlaw M.D. [facility] 03/20/2014, 3:35 PM Pager: [phone]   **Disclaimer: This note was dictated with voice recognition software. Similar sounding words can inadvertently be transcribed and this note may contain transcription errors which may not have been corrected upon publication of note.**

## 2014-03-20 NOTE — Progress Notes (Signed)
Inpatient Diabetes Program Recommendations  AACE/ADA: New Consensus Statement on Inpatient Glycemic Control (2013)  Target Ranges:  Prepandial:   less than 140 mg/dL      Peak postprandial:   less than 180 mg/dL (1-2 hours)      Critically ill patients:  140 - 180 mg/dL     Results for Yvonne Todd, Yvonne Todd (MRN [id number]) as of 03/20/2014 13:33  Ref. Range 03/19/2014 18:53  Hemoglobin A1C Latest Range: <5.7 % 6.3 (H)    Received referral for this patient from MD.  Reason "A1c 6.3, diabetic range, please provide diabetes education".  Per ADA guidelines, A1c of 6.5% or greater is positive diagnosis of DM.  A1c of 5.7-6.4% is high risk category for diabetes ("pre-diabetes").    Spoke with patient about her A1c of 6.3%.  Explained what an A1c is and what it measures.  Discussed with patient that she is at extremely high risk for DM given her family history of DM (mother has DM), her weight, and her cardiac risk factors.  Patient told me when she checks her CBGs at home (she uses her mother's meter on occasion) that she never sees any high elevations.  Explained to patient that her CBGs would likely not be extremely elevated given her A1c of only 6.3% but that it is imperative she make some lifestyle modifications now in order to prevent further issues with DM.  Discussed the importance of exercise and portion control with carbohydrate containing foods.  Patient told me she only drinks H2O, unsweet tea, and coffee with creamer.  Has an occasional alcoholic beverage with a coke every now and then.  Encouraged patient to continue to avoid beverages with sugar and to think about portion sizes with carbohydrate rich foods.  Encouraged more vegetables and smaller portion sizes of rice, pasta, potatoes, etc.  Note RD consult for DM diet education.    Not sure if patient will go home with CBG meter Rx.  If she does, encouraged patient to check her CBGs a few times a week and to keep a record of her CBGs for her  PCP.  Patient is familiar with how to check her own CBGs since she checks her mother's CBGs at home (mother has DM).    Will follow Ambrose FinlandJeannine Johnston Beautiful Pensyl RN, MSN, CDE Diabetes Coordinator Inpatient Diabetes Program Team Pager: [phone] (8a-10p)

## 2014-03-20 NOTE — Progress Notes (Signed)
  RD consulted for nutrition education regarding pre-diabetes with A1C of 6.3. Patient reports that her mom is diabetic and she knows what diet she should follow. She requested that RD leave handout for her to review, instead of RD reviewing the handout with her.  Lab Results  Component Value Date   HGBA1C 6.3* 03/19/2014    RD provided "Carbohydrate Counting for People with Diabetes" handout from the Academy of Nutrition and Dietetics. Provided list of carbohydrates and recommended serving sizes of common foods.  Expect poor compliance.  Body mass index is 32.33 kg/(m^2). Pt meets criteria for class 1 obesity based on current BMI.  Current diet order is CHO modified, patient is consuming approximately 75% of meals at this time. Labs and medications reviewed. No further nutrition interventions warranted at this time. RD contact information provided. If additional nutrition issues arise, please re-consult RD.  Joaquin CourtsKimberly Danaisha Celli, RD, LDN, CNSC Pager [phone] After Hours Pager [phone]

## 2014-03-20 NOTE — Progress Notes (Signed)
Patient ID: BURNA ATLAS  female  WUJ:[id number]    DOB: October 02, 1950    DOA: 03/19/2014  PCP: No primary provider on file.  Assessment/Plan: Principal Problem:   Chest pain - Currently resolved, ruled out for acute disease, cardiac enzymes x3 negative, risk factors include family history, smoking, dyslipidemia. No prior cardiac workup. - cardiology consulted, recommended stress myoview  rule out any ischemia, 2-D echocardiogram to evaluate LVEF  Active Problems:   Tobacco use disorder - smoking cessation counseled     Other and unspecified hyperlipidemia,  Dyslipidemia - Lipid panel showed cholesterol of 258, LDL 187   Diabetes mellitus, new diagnosis -Hemoglobin A1c 6.3, in diabetes range, CBGs controlled - will provide nutrition and diabetic education - Will need to followup closely with her PCP    Left carotid bruit -Carotid Dopplers    DVT Prophylaxis:  Code Status:  Family Communication:  Disposition:   Consultants:   cardiology   Procedures:   nuclear medicine stress test, 2-D echo   Antibiotics:   none     Subjective:  patient seen and examined, no chest pain no shortness of breath at this time   Objective: Weight change:  No intake or output data in the 24 hours ending 03/20/14 0856 Blood pressure 137/55, pulse 70, temperature 98 F (36.7 C), temperature source Oral, resp. rate 20, height 5\' 2"  (1.575 m), weight 80.196 kg (176 lb 12.8 oz), SpO2 90.00%.  Physical Exam: General: Alert and awake, oriented x3, not in any acute distress. CVS: S1-S2 clear, no murmur rubs or gallops Chest: clear to auscultation bilaterally, no wheezing, rales or rhonchi Abdomen: soft nontender, nondistended, normal bowel sounds  Extremities: no cyanosis, clubbing or edema noted bilaterally Neuro: Cranial nerves II-XII intact, no focal neurological deficits  Lab Results: Basic Metabolic Panel:  Recent Labs Lab 03/19/14 1540 03/19/14 1853 03/20/14 0012  NA 142   --  143  K 4.7  --  4.4  CL 100  --  101  CO2 29  --  32  GLUCOSE 109*  --  115*  BUN 7  --  9  CREATININE 0.59 0.62 0.67  CALCIUM 9.2  --  8.8  MG  --  2.2  --   PHOS  --  4.4  --    Liver Function Tests: No results found for this basename: AST, ALT, ALKPHOS, BILITOT, PROT, ALBUMIN,  in the last 168 hours No results found for this basename: LIPASE, AMYLASE,  in the last 168 hours No results found for this basename: AMMONIA,  in the last 168 hours CBC:  Recent Labs Lab 03/19/14 1540 03/20/14 0012  WBC 5.1 6.4  NEUTROABS 3.3  --   HGB 13.3 12.1  HCT 42.2 38.7  MCV 85.8 86.6  PLT 279 254   Cardiac Enzymes:  Recent Labs Lab 03/19/14 1853 03/20/14 0002 03/20/14 0655  TROPONINI <0.30 <0.30 <0.30   BNP: No components found with this basename: POCBNP,  CBG: No results found for this basename: GLUCAP,  in the last 168 hours   Micro Results: No results found for this or any previous visit (from the past 240 hour(s)).  Studies/Results: Dg Chest 2 View  03/19/2014   CLINICAL DATA:  Chest pain, shortness of breath.  EXAM: CHEST  2 VIEW  COMPARISON:  None.  FINDINGS: The heart size and mediastinal contours are within normal limits. Both lungs are clear. No pneumothorax or pleural effusion is noted. The visualized skeletal structures are unremarkable.  IMPRESSION: No  acute cardiopulmonary abnormality seen.   Electronically Signed   By: Roque LiasJames  Green M.D.   On: 03/19/2014 16:13    Medications: Scheduled Meds: . aspirin EC  81 mg Oral Daily  . enoxaparin (LOVENOX) injection  40 mg Subcutaneous Q24H  . simvastatin  20 mg Oral q1800      LOS: 1 day   Arynn Armand M.D. [facility] 03/20/2014, 8:56 AM Pager: [phone]  If 7PM-7AM, please contact night-coverage [email or web address] Password [email or web address]  **Disclaimer: This note was dictated with voice recognition software. Similar sounding words can inadvertently be transcribed and this note may contain transcription errors  which may not have been corrected upon publication of note.**

## 2014-04-27 ENCOUNTER — Ambulatory Visit (INDEPENDENT_AMBULATORY_CARE_PROVIDER_SITE_OTHER): Payer: BC Managed Care – PPO | Admitting: Nurse Practitioner

## 2014-04-27 ENCOUNTER — Encounter: Payer: Self-pay | Admitting: Nurse Practitioner

## 2014-04-27 VITALS — BP 164/90 | HR 66 | Temp 97.8°F | Resp 16 | Ht 62.5 in | Wt 179.8 lb

## 2014-04-27 DIAGNOSIS — E1165 Type 2 diabetes mellitus with hyperglycemia: Secondary | ICD-10-CM

## 2014-04-27 DIAGNOSIS — E785 Hyperlipidemia, unspecified: Secondary | ICD-10-CM

## 2014-04-27 DIAGNOSIS — H547 Unspecified visual loss: Secondary | ICD-10-CM

## 2014-04-27 DIAGNOSIS — IMO0001 Reserved for inherently not codable concepts without codable children: Secondary | ICD-10-CM

## 2014-04-27 DIAGNOSIS — F17200 Nicotine dependence, unspecified, uncomplicated: Secondary | ICD-10-CM

## 2014-04-27 DIAGNOSIS — M12569 Traumatic arthropathy, unspecified knee: Secondary | ICD-10-CM

## 2014-04-27 DIAGNOSIS — R079 Chest pain, unspecified: Secondary | ICD-10-CM

## 2014-04-27 DIAGNOSIS — I10 Essential (primary) hypertension: Secondary | ICD-10-CM

## 2014-04-27 DIAGNOSIS — Z23 Encounter for immunization: Secondary | ICD-10-CM

## 2014-04-27 DIAGNOSIS — M12561 Traumatic arthropathy, right knee: Secondary | ICD-10-CM

## 2014-04-27 LAB — GLUCOSE, POCT (MANUAL RESULT ENTRY): POC GLUCOSE: 121 mg/dL — AB (ref 70–99)

## 2014-04-27 MED ORDER — AMLODIPINE BESYLATE 5 MG PO TABS: 5.0000 mg | ORAL_TABLET | Freq: Every day | ORAL | Status: DC

## 2014-04-27 NOTE — Assessment & Plan Note (Signed)
1ppd/40 years.  check PFT's Patient reports wanting to stop smoking once she is hosptialized for knee surgery
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
## 2014-04-27 NOTE — Assessment & Plan Note (Addendum)
Discussed low CHO diet.  Check urine microalbumnia. Random BG today. 120

## 2014-04-27 NOTE — Assessment & Plan Note (Signed)
Counseled with patient regarding low fat, CHO, diet.

## 2014-04-27 NOTE — Assessment & Plan Note (Signed)
No further episodes of chest pain.  Patient will need to follow up with Cardiologist for surgecal clearance for Knee replacement.

## 2014-04-27 NOTE — Progress Notes (Signed)
Pre visit review using our clinic review tool, if applicable. No additional management support is needed unless otherwise documented below in the visit note. 

## 2014-04-27 NOTE — Patient Instructions (Addendum)
Encourage to stop smoking Make appointment with Cardiologist for surgical clearance for knee surgery. Make follow up appointment for Primary Care Physician for surgical clearance Low CHO diet.  Flu vaccine given to day. Apply warm compresses to site if becomes painful  Hypertension Hypertension, commonly called high blood pressure, is when the force of blood pumping through your arteries is too strong. Your arteries are the blood vessels that carry blood from your heart throughout your body. A blood pressure reading consists of a higher number over a lower number, such as 110/72. The higher number (systolic) is the pressure inside your arteries when your heart pumps. The lower number (diastolic) is the pressure inside your arteries when your heart relaxes. Ideally you want your blood pressure below 120/80. Hypertension forces your heart to work harder to pump blood. Your arteries may become narrow or stiff. Having hypertension puts you at risk for heart disease, stroke, and other problems.  RISK FACTORS Some risk factors for high blood pressure are controllable. Others are not.  Risk factors you cannot control include:   Race. You may be at higher risk if you are African American.  Age. Risk increases with age.  Gender. Men are at higher risk than women before age 87 years. After age 45, women are at higher risk than men. Risk factors you can control include:  Not getting enough exercise or physical activity.  Being overweight.  Getting too much fat, sugar, calories, or salt in your diet.  Drinking too much alcohol. SIGNS AND SYMPTOMS Hypertension does not usually cause signs or symptoms. Extremely high blood pressure (hypertensive crisis) may cause headache, anxiety, shortness of breath, and nosebleed. DIAGNOSIS  To check if you have hypertension, your health care provider will measure your blood pressure while you are seated, with your arm held at the level of your heart. It should be  measured at least twice using the same arm. Certain conditions can cause a difference in blood pressure between your right and left arms. A blood pressure reading that is higher than normal on one occasion does not mean that you need treatment. If one blood pressure reading is high, ask your health care provider about having it checked again. TREATMENT  Treating high blood pressure includes making lifestyle changes and possibly taking medicine. Living a healthy lifestyle can help lower high blood pressure. You may need to change some of your habits. Lifestyle changes may include:  Following the DASH diet. This diet is high in fruits, vegetables, and whole grains. It is low in salt, red meat, and added sugars.  Getting at least 2 hours of brisk physical activity every week.  Losing weight if necessary.  Not smoking.  Limiting alcoholic beverages.  Learning ways to reduce stress. If lifestyle changes are not enough to get your blood pressure under control, your health care provider may prescribe medicine. You may need to take more than one. Work closely with your health care provider to understand the risks and benefits. HOME CARE INSTRUCTIONS  Have your blood pressure rechecked as directed by your health care provider.   Take medicines only as directed by your health care provider. Follow the directions carefully. Blood pressure medicines must be taken as prescribed. The medicine does not work as well when you skip doses. Skipping doses also puts you at risk for problems.   Do not smoke.   Monitor your blood pressure at home as directed by your health care provider. SEEK MEDICAL CARE IF:   You think  you are having a reaction to medicines taken.  You have recurrent headaches or feel dizzy.  You have swelling in your ankles.  You have trouble with your vision. SEEK IMMEDIATE MEDICAL CARE IF:  You develop a severe headache or confusion.  You have unusual weakness, numbness,  or feel faint.  You have severe chest or abdominal pain.  You vomit repeatedly.  You have trouble breathing. MAKE SURE YOU:   Understand these instructions.  Will watch your condition.  Will get help right away if you are not doing well or get worse. Document Released: 08/18/2005 Document Revised: 01/02/2014 Document Reviewed: 06/10/2013 [facility] Patient Information 2015 [place], [place]. This information is not intended to replace advice given to you by your health care provider. Make sure you discuss any questions you have with your health care provider. Diabetes and Exercise Exercising regularly is important. It is not just about losing weight. It has many health benefits, such as:  Improving your overall fitness, flexibility, and endurance.  Increasing your bone density.  Helping with weight control.  Decreasing your body fat.  Increasing your muscle strength.  Reducing stress and tension.  Improving your overall health. People with diabetes who exercise gain additional benefits because exercise:  Reduces appetite.  Improves the body's use of blood sugar (glucose).  Helps lower or control blood glucose.  Decreases blood pressure.  Helps control blood lipids (such as cholesterol and triglycerides).  Improves the body's use of the hormone insulin by:  Increasing the body's insulin sensitivity.  Reducing the body's insulin needs.  Decreases the risk for heart disease because exercising:  Lowers cholesterol and triglycerides levels.  Increases the levels of good cholesterol (such as high-density lipoproteins [HDL]) in the body.  Lowers blood glucose levels. YOUR ACTIVITY PLAN  Choose an activity that you enjoy and set realistic goals. Your health care provider or diabetes educator can help you make an activity plan that works for you. Exercise regularly as directed by your health care provider. This includes:  Performing resistance training twice a week such  as push-ups, sit-ups, lifting weights, or using resistance bands.  Performing 150 minutes of cardio exercises each week such as walking, running, or playing sports.  Staying active and spending no more than 90 minutes at one time being inactive. Even short bursts of exercise are good for you. Three 10-minute sessions spread throughout the day are just as beneficial as a single 30-minute session. Some exercise ideas include:  Taking the dog for a walk.  Taking the stairs instead of the elevator.  Dancing to your favorite song.  Doing an exercise video.  Doing your favorite exercise with a friend. RECOMMENDATIONS FOR EXERCISING WITH TYPE 1 OR TYPE 2 DIABETES   Check your blood glucose before exercising. If blood glucose levels are greater than 240 mg/dL, check for urine ketones. Do not exercise if ketones are present.  Avoid injecting insulin into areas of the body that are going to be exercised. For example, avoid injecting insulin into:  The arms when playing tennis.  The legs when jogging.  Keep a record of:  Food intake before and after you exercise.  Expected peak times of insulin action.  Blood glucose levels before and after you exercise.  The type and amount of exercise you have done.  Review your records with your health care provider. Your health care provider will help you to develop guidelines for adjusting food intake and insulin amounts before and after exercising.  If you take insulin  or oral hypoglycemic agents, watch for signs and symptoms of hypoglycemia. They include:  Dizziness.  Shaking.  Sweating.  Chills.  Confusion.  Drink plenty of water while you exercise to prevent dehydration or heat stroke. Body water is lost during exercise and must be replaced.  Talk to your health care provider before starting an exercise program to make sure it is safe for you. Remember, almost any type of activity is better than none. Document Released: 11/08/2003  Document Revised: 01/02/2014 Document Reviewed: 01/25/2013 [facility] Patient Information 2015 [place], [place]. This information is not intended to replace advice given to you by your health care provider. Make sure you discuss any questions you have with your health care provider.

## 2014-05-15 ENCOUNTER — Encounter (INDEPENDENT_AMBULATORY_CARE_PROVIDER_SITE_OTHER): Payer: Self-pay

## 2014-05-15 ENCOUNTER — Encounter: Payer: Self-pay | Admitting: Cardiovascular Disease

## 2014-05-15 ENCOUNTER — Ambulatory Visit (INDEPENDENT_AMBULATORY_CARE_PROVIDER_SITE_OTHER): Payer: BC Managed Care – PPO | Admitting: Cardiovascular Disease

## 2014-05-15 VITALS — BP 138/80 | HR 67 | Ht 62.0 in | Wt 181.0 lb

## 2014-05-15 DIAGNOSIS — Z01818 Encounter for other preprocedural examination: Secondary | ICD-10-CM

## 2014-05-15 DIAGNOSIS — R0789 Other chest pain: Secondary | ICD-10-CM
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
## 2014-05-15 NOTE — Assessment & Plan Note (Signed)
Yvonne Todd  was sent to me for preoperative evaluation for an episode of chest pain. She was just hospitalized 2 months ago for this episode of chest pain and has not had any chest pain since that time. She ruled out for myocardial infarction. A stress Myoview study was low risk.  She was seen by Dr. Armanda Magic at that time.  She's not had any further cardiac palpitations. At this point I do not think that she needs any additional evaluation since the complete evaluation has already been done. She should be at low risk for upcoming knee surgery.  We will see her on an as-needed basis. If she needs any  further cardiology followup, she may follow up with Dr. Armanda Magic.

## 2014-05-15 NOTE — Patient Instructions (Signed)
Your physician recommends that you schedule a follow-up appointment in:  As needed  Your physician recommends that you continue on your current medications as directed. Please refer to the Current Medication list given to you today.  

## 2014-05-23 ENCOUNTER — Ambulatory Visit (INDEPENDENT_AMBULATORY_CARE_PROVIDER_SITE_OTHER): Payer: BC Managed Care – PPO | Admitting: Internal Medicine

## 2014-05-23 DIAGNOSIS — F17200 Nicotine dependence, unspecified, uncomplicated: Secondary | ICD-10-CM

## 2014-05-23 LAB — PULMONARY FUNCTION TEST
DL/VA % PRED: 60 %
DL/VA: 2.74 ml/min/mmHg/L
DLCO UNC: 10.32 ml/min/mmHg
DLCO unc % pred: 47 %
FEF 25-75 PRE: 0.28 L/s
FEF 25-75 Post: 0.34 L/sec
FEF2575-%Change-Post: 23 %
FEF2575-%PRED-PRE: 13 %
FEF2575-%Pred-Post: 16 %
FEV1-%Change-Post: 10 %
FEV1-%Pred-Post: 32 %
FEV1-%Pred-Pre: 29 %
FEV1-Post: 0.73 L
FEV1-Pre: 0.66 L
FEV1FVC-%CHANGE-POST: 0 %
FEV1FVC-%Pred-Pre: 55 %
FEV6-%CHANGE-POST: 10 %
FEV6-%PRED-POST: 57 %
FEV6-%Pred-Pre: 52 %
FEV6-PRE: 1.48 L
FEV6-Post: 1.63 L
FEV6FVC-%Change-Post: 0 %
FEV6FVC-%Pred-Post: 99 %
FEV6FVC-%Pred-Pre: 100 %
FVC-%Change-Post: 10 %
FVC-%PRED-POST: 58 %
FVC-%Pred-Pre: 52 %
FVC-POST: 1.71 L
FVC-PRE: 1.54 L
POST FEV6/FVC RATIO: 96 %
Post FEV1/FVC ratio: 43 %
Pre FEV1/FVC ratio: 43 %
Pre FEV6/FVC Ratio: 96 %
RV % pred: 189 %
RV: 3.72 L
TLC % PRED: 115 %
TLC: 5.5 L

## 2014-05-23 NOTE — Progress Notes (Signed)
PFT done today. 

## 2014-05-25 ENCOUNTER — Ambulatory Visit (INDEPENDENT_AMBULATORY_CARE_PROVIDER_SITE_OTHER): Payer: BC Managed Care – PPO | Admitting: Internal Medicine

## 2014-05-25 ENCOUNTER — Encounter: Payer: Self-pay | Admitting: Internal Medicine

## 2014-05-25 VITALS — BP 138/88 | HR 69 | Temp 97.8°F | Resp 18 | Ht 62.0 in | Wt 179.1 lb

## 2014-05-25 DIAGNOSIS — Z01818 Encounter for other preprocedural examination: Secondary | ICD-10-CM

## 2014-05-25 MED ORDER — FLUTICASONE PROPIONATE 50 MCG/ACT NA SUSP: 2.0000 | Freq: Two times a day (BID) | NASAL | Status: DC

## 2014-05-25 NOTE — Progress Notes (Signed)
Pre visit review using our clinic review tool, if applicable. No additional management support is needed unless otherwise documented below in the visit note. 

## 2014-05-25 NOTE — Patient Instructions (Signed)
We will try to clean out your ear today. We have sent in the clearance for your surgery today.   Take flonase 2 puffs in each nostril twice a day.

## 2014-05-26 DIAGNOSIS — Z01818 Encounter for other preprocedural examination: Secondary | ICD-10-CM | POA: Insufficient documentation

## 2014-05-26 NOTE — Progress Notes (Signed)
   Subjective:    Patient ID: Yvonne Todd, female    DOB: 04-Nov-1950, 63 y.o.   MRN: [id number]  HPI The patient is a 62 YO female coming in for pre-surgical clearance for her knee replacement surgery. She is not able to exercise as well given her knee pain. She saw cardiology and was cleared earlier this month. She has PMH of HTN, hyperlipidemia, allergies. She has normal kidney function. She denies chest pains, SOB.  Review of Systems  Constitutional: Negative for diaphoresis, activity change, appetite change and fatigue.  HENT: Negative.   Eyes: Negative.   Respiratory: Negative for chest tightness and shortness of breath.   Cardiovascular: Negative for chest pain, palpitations and leg swelling.  Gastrointestinal: Negative for abdominal pain, diarrhea, constipation and abdominal distention.  Musculoskeletal: Positive for arthralgias.  Neurological: Negative.       Objective:   Physical Exam  Constitutional: She is oriented to person, place, and time. She appears well-developed and well-nourished. She appears distressed.  In pain from her knee  HENT:  Head: Normocephalic and atraumatic.  Eyes: EOM are normal.  Neck: Normal range of motion.  Cardiovascular: Normal rate and regular rhythm.   Pulmonary/Chest: Effort normal and breath sounds normal. No respiratory distress. She has no wheezes.  Abdominal: Soft. Bowel sounds are normal.  Neurological: She is alert and oriented to person, place, and time.  Skin: Skin is warm and dry.   Filed Vitals:   05/25/14 1536  BP: 138/88  Pulse: 69  Temp: 97.8 F (36.6 C)  TempSrc: Oral  Resp: 18  Height:  (1.575 m)  Weight: 179 lb 1.9 oz (81.248 kg)  SpO2: 90%      Assessment & Plan:

## 2014-05-26 NOTE — Assessment & Plan Note (Signed)
No other tests needed, EKG done with cardiology. BMP last with creatinine normal. Would recommend proceed to knee replacement surgery and stop ASA 5 days prior to surgery.

## 2014-05-31 ENCOUNTER — Encounter (HOSPITAL_COMMUNITY): Payer: Self-pay | Admitting: Physician Assistant

## 2014-05-31 DIAGNOSIS — M1711 Unilateral primary osteoarthritis, right knee: Secondary | ICD-10-CM | POA: Diagnosis present

## 2014-06-01 ENCOUNTER — Encounter: Payer: Self-pay | Admitting: Internal Medicine

## 2014-06-01 ENCOUNTER — Ambulatory Visit (INDEPENDENT_AMBULATORY_CARE_PROVIDER_SITE_OTHER): Payer: BC Managed Care – PPO | Admitting: Internal Medicine

## 2014-06-01 ENCOUNTER — Ambulatory Visit (INDEPENDENT_AMBULATORY_CARE_PROVIDER_SITE_OTHER)
Admission: RE | Admit: 2014-06-01 | Discharge: 2014-06-01 | Disposition: A | Discharge status: Home / Self Care | Payer: BC Managed Care – PPO | Source: Ambulatory Visit | Attending: Internal Medicine | Admitting: Internal Medicine

## 2014-06-01 VITALS — BP 144/84 | HR 72 | Temp 98.2°F | Resp 22 | Ht 62.5 in | Wt 182.0 lb

## 2014-06-01 DIAGNOSIS — R0902 Hypoxemia: Secondary | ICD-10-CM

## 2014-06-01 DIAGNOSIS — R05 Cough: Secondary | ICD-10-CM

## 2014-06-01 DIAGNOSIS — R059 Cough, unspecified: Secondary | ICD-10-CM

## 2014-06-01 MED ORDER — LEVOFLOXACIN 500 MG PO TABS: 500.0000 mg | ORAL_TABLET | Freq: Every day | ORAL | Status: DC

## 2014-06-01 MED ORDER — ALBUTEROL SULFATE HFA 108 (90 BASE) MCG/ACT IN AERS: 2.0000 | INHALATION_SPRAY | Freq: Four times a day (QID) | RESPIRATORY_TRACT | Status: DC | PRN

## 2014-06-01 NOTE — Patient Instructions (Signed)
We will check the chest x-ray and have you come back upstairs. If you oxygen levels stay low we may need to arrange for oxygen therapy short term. If the chest x-ray does not show pneumonia we may have to advise that you go to the emergency room to get checked out for a blood clot.

## 2014-06-01 NOTE — Progress Notes (Signed)
Pre visit review using our clinic review tool, if applicable. No additional management support is needed unless otherwise documented below in the visit note. 

## 2014-06-02 ENCOUNTER — Encounter (HOSPITAL_COMMUNITY): Payer: Self-pay

## 2014-06-02 ENCOUNTER — Inpatient Hospital Stay (HOSPITAL_COMMUNITY)
Admission: RE | Admit: 2014-06-02 | Discharge: 2014-06-02 | Disposition: A | Discharge status: Home / Self Care | Payer: BC Managed Care – PPO | Source: Ambulatory Visit

## 2014-06-02 ENCOUNTER — Other Ambulatory Visit (HOSPITAL_COMMUNITY): Payer: Self-pay | Admitting: *Deleted

## 2014-06-02 DIAGNOSIS — R0902 Hypoxemia: Secondary | ICD-10-CM | POA: Insufficient documentation

## 2014-06-02 NOTE — Progress Notes (Signed)
   Subjective:    Patient ID: Yvonne Todd, female    DOB: [birth year], 63 y.o.   MRN: [id number]  HPI The patient is a 63 YO woman who is coming in today for acute SOB and cough. She states that it started Sunday and she has had some fevers at home since that time. She is coughing up some sputum but not sure what color. She was seen today for pre-op evaluation and her oxygen level was only 86 so she was sent over for evaluation. She is having some SOB with exertion but not just sitting. No recent travel or prolonged sitting recently. She denies leg pain or swelling. She denies chest pain or discomfort.   Review of Systems  Constitutional: Positive for fever. Negative for chills, activity change and appetite change.  Respiratory: Positive for cough and shortness of breath. Negative for chest tightness.   Cardiovascular: Negative for chest pain, palpitations and leg swelling.  Gastrointestinal: Negative for nausea, vomiting, abdominal pain, diarrhea, constipation and abdominal distention.  Musculoskeletal: Negative for back pain and neck pain.  Neurological: Negative for dizziness, weakness, light-headedness and headaches.      Objective:   Physical Exam  Vitals reviewed. Constitutional: She appears well-developed and well-nourished. No distress.  HENT:  Head: Normocephalic and atraumatic.  Eyes: EOM are normal.  Neck: Normal range of motion.  Cardiovascular: Normal rate and regular rhythm.   Pulmonary/Chest: Effort normal. No respiratory distress.  Scattered rhonchi in right lung, mostly lower half. Some mild expiratory wheeze on the left side.   Abdominal: Soft. Bowel sounds are normal.      Assessment & Plan:

## 2014-06-02 NOTE — Assessment & Plan Note (Signed)
At our office lowest oxygen readings 88% after walking and with sitting improved to 95%. CXR ordered to rule out pneumonia which looks more congested in the right lower lobe with some blurring of the heart border. Will rx levaquin 7 days and albuterol inhaler. She will return next week for recheck.

## 2014-06-02 NOTE — Pre-Procedure Instructions (Signed)
Yvonne Todd  06/02/2014   Your procedure is scheduled on:  Monday, June 12, 2014    Report to [facility] Entrance "A" Admitting Office at 10:00 AM.   Call this number if you have problems the morning of surgery: [phone]   Remember:   Do not eat food or drink liquids after midnight Sunday, 06/11/14.   Take these medicines the morning of surgery with A SIP OF WATER: inhaler,(bring with you),amlodipine, pain med, nitro if needed     Take all meds as ordered until day of surgery except as instructed below or per dr    Despina Arias all herbel meds, nsaids (aleve,naproxen,advil,ibuprofen) 5 days prior to surgery including aspirin,vitamins,lemon balm   Do not wear jewelry, make-up or nail polish.  Do not wear lotions, powders, or perfumes. You may wear deodorant.  Do not shave 48 hours prior to surgery.   Do not bring valuables to the hospital.  [facility] is not responsible                  for any belongings or valuables.               Contacts, dentures or bridgework may not be worn into surgery.  Leave suitcase in the car. After surgery it may be brought to your room.  For patients admitted to the hospital, discharge time is determined by your                treatment team.              Special Instructions: [facility] - Preparing for Surgery  Before surgery, you can play an important role.  Because skin is not sterile, your skin needs to be as free of germs as possible.  You can reduce the number of germs on you skin by washing with CHG (chlorahexidine gluconate) soap before surgery.  CHG is an antiseptic cleaner which kills germs and bonds with the skin to continue killing germs even after washing.  Please DO NOT use if you have an allergy to CHG or antibacterial soaps.  If your skin becomes reddened/irritated stop using the CHG and inform your nurse when you arrive at Short Stay.  Do not shave (including legs and underarms) for at least 48 hours prior to the first CHG  shower.  You may shave your face.  Please follow these instructions carefully:   1.  Shower with CHG Soap the night before surgery and the                                morning of Surgery.  2.  If you choose to wash your hair, wash your hair first as usual with your       normal shampoo.  3.  After you shampoo, rinse your hair and body thoroughly to remove the                      Shampoo.  4.  Use CHG as you would any other liquid soap.  You can apply chg directly       to the skin and wash gently with scrungie or a clean washcloth.  5.  Apply the CHG Soap to your body ONLY FROM THE NECK DOWN.        Do not use on open wounds or open sores.  Avoid contact with your eyes, ears, mouth and  genitals (private parts).  Wash genitals (private parts) with your normal soap.  6.  Wash thoroughly, paying special attention to the area where your surgery        will be performed.  7.  Thoroughly rinse your body with warm water from the neck down.  8.  DO NOT shower/wash with your normal soap after using and rinsing off       the CHG Soap.  9.  Pat yourself dry with a clean towel.            10.  Wear clean pajamas.            11.  Place clean sheets on your bed the night of your first shower and do not        sleep with pets.  Day of Surgery  Do not apply any lotions the morning of surgery.  Please wear clean clothes to the hospital/surgery center.     Please read over the following fact sheets that you were given: Pain Booklet, Coughing and Deep Breathing, Blood Transfusion Information, MRSA Information and Surgical Site Infection Prevention

## 2014-06-09 ENCOUNTER — Encounter: Payer: Self-pay | Admitting: Internal Medicine

## 2014-06-09 ENCOUNTER — Ambulatory Visit (INDEPENDENT_AMBULATORY_CARE_PROVIDER_SITE_OTHER): Payer: BC Managed Care – PPO | Admitting: Internal Medicine

## 2014-06-09 VITALS — BP 142/82 | HR 70 | Temp 98.4°F | Resp 18 | Ht 62.0 in | Wt 180.4 lb

## 2014-06-09 DIAGNOSIS — R0902 Hypoxemia: Secondary | ICD-10-CM

## 2014-06-09 NOTE — Progress Notes (Signed)
Pre visit review using our clinic review tool, if applicable. No additional management support is needed unless otherwise documented below in the visit note. 

## 2014-06-09 NOTE — Patient Instructions (Signed)
We will see you back in about 3-5 months from now to check on your other medical problems. If you have any problems or questions before then please feel free to call us.  You are off the antibiotics and your oxygen levels are back to normal.

## 2014-06-09 NOTE — Assessment & Plan Note (Signed)
Resolved post pneumonia treatment. Oxygen level today 95% after walking and feel that she is cleared for surgery in November.

## 2014-06-09 NOTE — Progress Notes (Signed)
   Subjective:    Patient ID: Yvonne Todd, female    DOB: [birth year], 63 y.o.   MRN: [id number]  HPI The patient is a 63 YO female who is being seen in follow up of a pneumonia. She finished course of antibiotics and is feeling much better. No fevers, chills, SOB, sputum production. She feels like she can walk around much better.   Review of Systems  Constitutional: Negative for diaphoresis, activity change, appetite change and fatigue.  HENT: Negative.   Eyes: Negative.   Respiratory: Negative for chest tightness and shortness of breath.   Cardiovascular: Negative for chest pain, palpitations and leg swelling.  Gastrointestinal: Negative for abdominal pain, diarrhea, constipation and abdominal distention.  Musculoskeletal: Positive for arthralgias.  Neurological: Negative.       Objective:   Physical Exam  Constitutional: She is oriented to person, place, and time. She appears well-developed and well-nourished. She appears distressed.  In pain from her knee  HENT:  Head: Normocephalic and atraumatic.  Eyes: EOM are normal.  Neck: Normal range of motion.  Cardiovascular: Normal rate and regular rhythm.   Pulmonary/Chest: Effort normal and breath sounds normal. No respiratory distress. She has no wheezes.  Abdominal: Soft. Bowel sounds are normal.  Neurological: She is alert and oriented to person, place, and time.  Skin: Skin is warm and dry.   Filed Vitals:   06/09/14 1536  BP: 142/82  Pulse: 70  Temp: 98.4 F (36.9 C)  TempSrc: Oral  Resp: 18  Height: 5\' 2"  (1.575 m)  Weight: 180 lb 6.4 oz (81.829 kg)  SpO2: 95%      Assessment & Plan:

## 2014-06-12 ENCOUNTER — Inpatient Hospital Stay (HOSPITAL_COMMUNITY)
Admission: RE | Admit: 2014-06-12 | Payer: BC Managed Care – PPO | Source: Ambulatory Visit | Admitting: Orthopedic Surgery

## 2014-06-12 ENCOUNTER — Encounter (HOSPITAL_COMMUNITY): Admission: RE | Payer: Self-pay | Source: Ambulatory Visit

## 2014-06-12 HISTORY — DX: Unilateral primary osteoarthritis, right knee: M17.11

## 2014-06-12 SURGERY — ARTHROPLASTY, KNEE, TOTAL
Anesthesia: General | Laterality: Right
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
## 2014-06-22 ENCOUNTER — Encounter (HOSPITAL_COMMUNITY): Payer: Self-pay | Admitting: Pharmacy Technician

## 2014-06-22 NOTE — Pre-Procedure Instructions (Signed)
Yvonne Todd  06/22/2014   Your procedure is scheduled on:  Monday, November 2nd  [facility]  Call this number if you have problems the morning of surgery: [phone]   Remember:   Do not eat food or drink liquids after midnight.   Take these medicines the morning of surgery with A SIP OF WATER: norvasc, flonase, albuterol if needed, hydrocodone if needed  Stop taking aspirin, OTC vitamins/herbal medications, NSAIDS (ibuprofen, advil, motrin) 7 days prior to surgery.   Do not wear jewelry, make-up or nail polish.  Do not wear lotions, powders, or perfumes. You may wear deodorant.  Do not shave 48 hours prior to surgery. Men may shave face and neck.  Do not bring valuables to the hospital.  [facility] is not responsible   for any belongings or valuables.               Contacts, dentures or bridgework may not be worn into surgery.  Leave suitcase in the car. After surgery it may be brought to your room.  For patients admitted to the hospital, discharge time is determined by your   treatment team.        Please read over the following fact sheets that you were given: Pain Booklet, Coughing and Deep Breathing, MRSA Information and Surgical Site Infection Prevention [place] - Preparing for Surgery  Before surgery, you can play an important role.  Because skin is not sterile, your skin needs to be as free of germs as possible.  You can reduce the number of germs on you skin by washing with CHG (chlorahexidine gluconate) soap before surgery.  CHG is an antiseptic cleaner which kills germs and bonds with the skin to continue killing germs even after washing.  Please DO NOT use if you have an allergy to CHG or antibacterial soaps.  If your skin becomes reddened/irritated stop using the CHG and inform your nurse when you arrive at Short Stay.  Do not shave (including legs and underarms) for at least 48 hours prior to the first CHG shower.  You  may shave your face.  Please follow these instructions carefully:   1.  Shower with CHG Soap the night before surgery and the morning of Surgery.  2.  If you choose to wash your hair, wash your hair first as usual with your normal shampoo.  3.  After you shampoo, rinse your hair and body thoroughly to remove the shampoo.  4.  Use CHG as you would any other liquid soap.  You can apply CHG directly to the skin and wash gently with scrungie or a clean washcloth.  5.  Apply the CHG Soap to your body ONLY FROM THE NECK DOWN.  Do not use on open wounds or open sores.  Avoid contact with your eyes, ears, mouth and genitals (private parts).  Wash genitals (private parts) with your normal soap.  6.  Wash thoroughly, paying special attention to the area where your surgery will be performed.  7.  Thoroughly rinse your body with warm water from the neck down.  8.  DO NOT shower/wash with your normal soap after using and rinsing off the CHG Soap.  9.  Pat yourself dry with a clean towel.            10.  Wear clean pajamas.            11.  Place clean sheets on your bed  the night of your first shower and do not sleep with pets.  Day of Surgery  Do not apply any lotions/deoderants the morning of surgery.  Please wear clean clothes to the hospital/surgery center.

## 2014-06-23 ENCOUNTER — Encounter (HOSPITAL_COMMUNITY)
Admission: RE | Admit: 2014-06-23 | Discharge: 2014-06-23 | Disposition: A | Discharge status: Home / Self Care | Payer: BC Managed Care – PPO | Source: Ambulatory Visit | Attending: Physician Assistant | Admitting: Physician Assistant

## 2014-06-23 ENCOUNTER — Encounter (HOSPITAL_COMMUNITY)
Admission: RE | Admit: 2014-06-23 | Discharge: 2014-06-23 | Disposition: A | Discharge status: Home / Self Care | Payer: BC Managed Care – PPO | Source: Ambulatory Visit | Attending: Orthopedic Surgery | Admitting: Orthopedic Surgery

## 2014-06-23 ENCOUNTER — Encounter (HOSPITAL_COMMUNITY): Payer: Self-pay

## 2014-06-23 DIAGNOSIS — I7090 Unspecified atherosclerosis: Secondary | ICD-10-CM | POA: Insufficient documentation

## 2014-06-23 DIAGNOSIS — Z87891 Personal history of nicotine dependence: Secondary | ICD-10-CM | POA: Diagnosis not present

## 2014-06-23 DIAGNOSIS — M1711 Unilateral primary osteoarthritis, right knee: Secondary | ICD-10-CM

## 2014-06-23 DIAGNOSIS — Z01812 Encounter for preprocedural laboratory examination: Secondary | ICD-10-CM | POA: Insufficient documentation

## 2014-06-23 DIAGNOSIS — I10 Essential (primary) hypertension: Secondary | ICD-10-CM | POA: Diagnosis not present

## 2014-06-23 DIAGNOSIS — J189 Pneumonia, unspecified organism: Secondary | ICD-10-CM

## 2014-06-23 DIAGNOSIS — Z8701 Personal history of pneumonia (recurrent): Secondary | ICD-10-CM | POA: Insufficient documentation

## 2014-06-23 HISTORY — DX: Pneumonia, unspecified organism: J18.9

## 2014-06-23 LAB — URINALYSIS, ROUTINE W REFLEX MICROSCOPIC
Bilirubin Urine: NEGATIVE
Glucose, UA: NEGATIVE mg/dL
Hgb urine dipstick: NEGATIVE
Ketones, ur: 15 mg/dL — AB
NITRITE: NEGATIVE
PROTEIN: NEGATIVE mg/dL
Specific Gravity, Urine: 1.005 (ref 1.005–1.030)
UROBILINOGEN UA: 0.2 mg/dL (ref 0.0–1.0)
pH: 7 (ref 5.0–8.0)

## 2014-06-23 LAB — COMPREHENSIVE METABOLIC PANEL
ALBUMIN: 4 g/dL (ref 3.5–5.2)
ALT: 10 U/L (ref 0–35)
AST: 14 U/L (ref 0–37)
Alkaline Phosphatase: 76 U/L (ref 39–117)
Anion gap: 14 (ref 5–15)
BUN: 7 mg/dL (ref 6–23)
CALCIUM: 9.1 mg/dL (ref 8.4–10.5)
CO2: 28 meq/L (ref 19–32)
CREATININE: 0.5 mg/dL (ref 0.50–1.10)
Chloride: 102 mEq/L (ref 96–112)
GFR calc Af Amer: >90 mL/min (ref >90)
Glucose, Bld: 97 mg/dL (ref 70–99)
Potassium: 3.7 mEq/L (ref 3.7–5.3)
SODIUM: 144 meq/L (ref 137–147)
TOTAL PROTEIN: 7.5 g/dL (ref 6.0–8.3)
Total Bilirubin: 0.3 mg/dL (ref 0.3–1.2)

## 2014-06-23 LAB — CBC WITH DIFFERENTIAL/PLATELET
BASOS ABS: 0 10*3/uL (ref 0.0–0.1)
BASOS PCT: 1 % (ref 0–1)
EOS PCT: 3 % (ref 0–5)
Eosinophils Absolute: 0.2 10*3/uL (ref 0.0–0.7)
HCT: 40.1 % (ref 36.0–46.0)
Hemoglobin: 13 g/dL (ref 12.0–15.0)
Lymphocytes Relative: 40 % (ref 12–46)
Lymphs Abs: 2.4 10*3/uL (ref 0.7–4.0)
MCH: 27 pg (ref 26.0–34.0)
MCHC: 32.4 g/dL (ref 30.0–36.0)
MCV: 83.2 fL (ref 78.0–100.0)
Monocytes Absolute: 0.4 10*3/uL (ref 0.1–1.0)
Monocytes Relative: 7 % (ref 3–12)
Neutro Abs: 3 10*3/uL (ref 1.7–7.7)
Neutrophils Relative %: 49 % (ref 43–77)
PLATELETS: 300 10*3/uL (ref 150–400)
RBC: 4.82 MIL/uL (ref 3.87–5.11)
RDW: 15.3 % (ref 11.5–15.5)
WBC: 6 10*3/uL (ref 4.0–10.5)

## 2014-06-23 LAB — TYPE AND SCREEN
ABO/RH(D): A NEG
ANTIBODY SCREEN: NEGATIVE

## 2014-06-23 LAB — ABO/RH: ABO/RH(D): A NEG

## 2014-06-23 LAB — SURGICAL PCR SCREEN
MRSA, PCR: NEGATIVE
Staphylococcus aureus: NEGATIVE

## 2014-06-23 LAB — URINE MICROSCOPIC-ADD ON

## 2014-06-23 LAB — APTT: APTT: 29 s (ref 24–37)

## 2014-06-23 LAB — PROTIME-INR
INR: 0.94 (ref 0.00–1.49)
PROTHROMBIN TIME: 12.7 s (ref 11.6–15.2)

## 2014-06-23 NOTE — Progress Notes (Signed)
Primary dr.kollar Saw dr. Melburn Poppernasher for cardiologist clearance due to chest pain in July (hospitalized) Clearance in epic

## 2014-06-24 LAB — URINE CULTURE

## 2014-06-30 NOTE — Progress Notes (Signed)
Time change noted for surgery on Mon. Called and spoke with Chrissie NoaWilliam and states that he will inform her. Chrissie NoaWilliam voices understanding.

## 2014-06-30 NOTE — Progress Notes (Signed)
Called pt and notified her of surgery time change and arrival time of 9:55 AM. Pt voiced understanding.

## 2014-07-02 MED ORDER — CHLORHEXIDINE GLUCONATE 4 % EX LIQD
60.0000 mL | Freq: Once | CUTANEOUS | Status: DC
  Filled 2014-07-02: qty 60

## 2014-07-02 MED ORDER — CLINDAMYCIN PHOSPHATE 900 MG/50ML IV SOLN
900.0000 mg | INTRAVENOUS | Status: AC
  Administered 2014-07-03: 900 mg via INTRAVENOUS
  Filled 2014-07-02: qty 50

## 2014-07-02 MED ORDER — LACTATED RINGERS IV SOLN
INTRAVENOUS | Status: DC
  Administered 2014-07-03: 10:00:00 via INTRAVENOUS

## 2014-07-02 NOTE — Anesthesia Preprocedure Evaluation (Addendum)
Anesthesia Evaluation  Patient identified by MRN, date of birth, ID band Patient awake    Reviewed: Allergy & Precautions, H&P , NPO status , Patient's Chart, lab work & pertinent test results  Airway Mallampati: II       Dental  (+) Poor Dentition   Pulmonary Current Smoker (40 pack year),          Cardiovascular hypertension, Pt. 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, vertebrals ok    GI/Hepatic   Endo/Other  diabetes, Type 2  Renal/GU      Musculoskeletal   Abdominal   Peds  Hematology   Anesthesia Other Findings   Reproductive/Obstetrics                            Anesthesia Physical Anesthesia Plan  ASA: III  Anesthesia Plan: General   Post-op Pain Management:    Induction:   Airway Management Planned: Oral ETT and LMA  Additional Equipment:   Intra-op Plan:   Post-operative Plan: Extubation in OR  Informed Consent: I have reviewed the patients History and Physical, chart, labs and discussed the procedure including the risks, benefits and alternatives for the proposed anesthesia with the patient or authorized representative who has indicated his/her understanding and acceptance.     Plan Discussed with:   Anesthesia Plan Comments:         Anesthesia Quick Evaluation

## 2014-07-03 ENCOUNTER — Encounter (HOSPITAL_COMMUNITY): Payer: Self-pay | Admitting: *Deleted

## 2014-07-03 ENCOUNTER — Inpatient Hospital Stay (HOSPITAL_COMMUNITY): Payer: BC Managed Care – PPO | Admitting: Anesthesiology

## 2014-07-03 ENCOUNTER — Encounter (HOSPITAL_COMMUNITY)
Admission: RE | Disposition: A | Discharge status: Skilled Nursing Facility | Payer: Self-pay | Source: Ambulatory Visit | Attending: Orthopedic Surgery

## 2014-07-03 ENCOUNTER — Inpatient Hospital Stay (HOSPITAL_COMMUNITY)
Admission: RE | Admit: 2014-07-03 | Discharge: 2014-07-06 | DRG: 470 | Disposition: A | Discharge status: Skilled Nursing Facility | Payer: BC Managed Care – PPO | Source: Ambulatory Visit | Attending: Orthopedic Surgery | Admitting: Orthopedic Surgery

## 2014-07-03 DIAGNOSIS — Z79899 Other long term (current) drug therapy: Secondary | ICD-10-CM

## 2014-07-03 DIAGNOSIS — E119 Type 2 diabetes mellitus without complications: Secondary | ICD-10-CM | POA: Diagnosis present

## 2014-07-03 DIAGNOSIS — J4489 Other specified chronic obstructive pulmonary disease: Secondary | ICD-10-CM | POA: Diagnosis present

## 2014-07-03 DIAGNOSIS — M1710 Unilateral primary osteoarthritis, unspecified knee: Secondary | ICD-10-CM | POA: Diagnosis present

## 2014-07-03 DIAGNOSIS — I10 Essential (primary) hypertension: Secondary | ICD-10-CM | POA: Diagnosis present

## 2014-07-03 DIAGNOSIS — Z87891 Personal history of nicotine dependence: Secondary | ICD-10-CM | POA: Diagnosis present

## 2014-07-03 DIAGNOSIS — Z791 Long term (current) use of non-steroidal anti-inflammatories (NSAID): Secondary | ICD-10-CM

## 2014-07-03 DIAGNOSIS — Z880 Allergy status to penicillin: Secondary | ICD-10-CM

## 2014-07-03 DIAGNOSIS — R0989 Other specified symptoms and signs involving the circulatory and respiratory systems: Secondary | ICD-10-CM | POA: Diagnosis present

## 2014-07-03 DIAGNOSIS — R7981 Abnormal blood-gas level: Secondary | ICD-10-CM | POA: Diagnosis not present

## 2014-07-03 DIAGNOSIS — J449 Chronic obstructive pulmonary disease, unspecified: Secondary | ICD-10-CM | POA: Diagnosis present

## 2014-07-03 DIAGNOSIS — F17210 Nicotine dependence, cigarettes, uncomplicated: Secondary | ICD-10-CM | POA: Diagnosis present

## 2014-07-03 DIAGNOSIS — M179 Osteoarthritis of knee, unspecified: Secondary | ICD-10-CM | POA: Diagnosis present

## 2014-07-03 DIAGNOSIS — F17200 Nicotine dependence, unspecified, uncomplicated: Secondary | ICD-10-CM

## 2014-07-03 DIAGNOSIS — M12561 Traumatic arthropathy, right knee: Secondary | ICD-10-CM | POA: Diagnosis present

## 2014-07-03 DIAGNOSIS — E785 Hyperlipidemia, unspecified: Secondary | ICD-10-CM | POA: Diagnosis present

## 2014-07-03 DIAGNOSIS — X58XXXS Exposure to other specified factors, sequela: Secondary | ICD-10-CM | POA: Diagnosis present

## 2014-07-03 DIAGNOSIS — Z888 Allergy status to other drugs, medicaments and biological substances status: Secondary | ICD-10-CM | POA: Diagnosis not present

## 2014-07-03 DIAGNOSIS — Z720 Tobacco use: Secondary | ICD-10-CM

## 2014-07-03 DIAGNOSIS — M1711 Unilateral primary osteoarthritis, right knee: Secondary | ICD-10-CM | POA: Diagnosis present

## 2014-07-03 DIAGNOSIS — R0902 Hypoxemia: Secondary | ICD-10-CM

## 2014-07-03 DIAGNOSIS — M25561 Pain in right knee: Secondary | ICD-10-CM | POA: Diagnosis present

## 2014-07-03 HISTORY — PX: TOTAL KNEE ARTHROPLASTY: SHX125

## 2014-07-03 LAB — GLUCOSE, CAPILLARY: GLUCOSE-CAPILLARY: 155 mg/dL — AB (ref 70–99)

## 2014-07-03 SURGERY — ARTHROPLASTY, KNEE, TOTAL
Anesthesia: General | Site: Knee | Laterality: Right

## 2014-07-03 MED ORDER — ALBUTEROL SULFATE (2.5 MG/3ML) 0.083% IN NEBU
3.0000 mL | INHALATION_SOLUTION | Freq: Four times a day (QID) | RESPIRATORY_TRACT | Status: DC | PRN
  Filled 2014-07-03: qty 3

## 2014-07-03 MED ORDER — CELECOXIB 200 MG PO CAPS
200.0000 mg | ORAL_CAPSULE | Freq: Two times a day (BID) | ORAL | Status: DC
  Administered 2014-07-03 – 2014-07-06 (×6): 200 mg via ORAL
  Filled 2014-07-03 (×8): qty 1

## 2014-07-03 MED ORDER — OXYCODONE HCL 5 MG PO TABS
ORAL_TABLET | ORAL | Status: AC
  Filled 2014-07-03: qty 1

## 2014-07-03 MED ORDER — SIMVASTATIN 20 MG PO TABS
20.0000 mg | ORAL_TABLET | Freq: Every day | ORAL | Status: DC
  Administered 2014-07-03 – 2014-07-05 (×3): 20 mg via ORAL
  Filled 2014-07-03 (×4): qty 1

## 2014-07-03 MED ORDER — FENTANYL CITRATE 0.05 MG/ML IJ SOLN
INTRAMUSCULAR | Status: AC
  Filled 2014-07-03: qty 2

## 2014-07-03 MED ORDER — OXYCODONE HCL ER 20 MG PO T12A
20.0000 mg | EXTENDED_RELEASE_TABLET | Freq: Two times a day (BID) | ORAL | Status: DC
  Administered 2014-07-03 – 2014-07-06 (×6): 20 mg via ORAL
  Filled 2014-07-03 (×6): qty 1

## 2014-07-03 MED ORDER — CLINDAMYCIN PHOSPHATE 600 MG/50ML IV SOLN
600.0000 mg | Freq: Four times a day (QID) | INTRAVENOUS | Status: AC
  Administered 2014-07-03 (×2): 600 mg via INTRAVENOUS
  Filled 2014-07-03 (×2): qty 50

## 2014-07-03 MED ORDER — SODIUM CHLORIDE 0.9 % IJ SOLN
INTRAMUSCULAR | Status: AC
  Filled 2014-07-03: qty 10

## 2014-07-03 MED ORDER — POLYETHYLENE GLYCOL 3350 17 G PO PACK
17.0000 g | PACK | Freq: Two times a day (BID) | ORAL | Status: DC
Start: 2014-07-03 — End: 2014-07-07
  Administered 2014-07-03 – 2014-07-06 (×6): 17 g via ORAL
  Filled 2014-07-03 (×8): qty 1

## 2014-07-03 MED ORDER — BUPIVACAINE-EPINEPHRINE (PF) 0.5% -1:200000 IJ SOLN
INTRAMUSCULAR | Status: DC | PRN
  Administered 2014-07-03: 30 mL via PERINEURAL

## 2014-07-03 MED ORDER — DEXAMETHASONE SODIUM PHOSPHATE 10 MG/ML IJ SOLN
10.0000 mg | Freq: Once | INTRAMUSCULAR | Status: AC
  Administered 2014-07-03: 10 mg via INTRAVENOUS

## 2014-07-03 MED ORDER — GLYCOPYRROLATE 0.2 MG/ML IJ SOLN
INTRAMUSCULAR | Status: AC
  Filled 2014-07-03: qty 3

## 2014-07-03 MED ORDER — NITROGLYCERIN 0.4 MG SL SUBL: 0.4000 mg | SUBLINGUAL_TABLET | SUBLINGUAL | Status: DC | PRN

## 2014-07-03 MED ORDER — ROCURONIUM BROMIDE 50 MG/5ML IV SOLN
INTRAVENOUS | Status: AC
  Filled 2014-07-03: qty 1

## 2014-07-03 MED ORDER — FENTANYL CITRATE 0.05 MG/ML IJ SOLN
25.0000 ug | INTRAMUSCULAR | Status: DC | PRN
  Administered 2014-07-03: 50 ug via INTRAVENOUS
  Administered 2014-07-03 (×3): 25 ug via INTRAVENOUS

## 2014-07-03 MED ORDER — METOCLOPRAMIDE HCL 5 MG PO TABS
5.0000 mg | ORAL_TABLET | Freq: Three times a day (TID) | ORAL | Status: DC | PRN
  Filled 2014-07-03: qty 2

## 2014-07-03 MED ORDER — DOCUSATE SODIUM 100 MG PO CAPS
100.0000 mg | ORAL_CAPSULE | Freq: Two times a day (BID) | ORAL | Status: DC
  Administered 2014-07-03 – 2014-07-06 (×6): 100 mg via ORAL
  Filled 2014-07-03 (×7): qty 1

## 2014-07-03 MED ORDER — 0.9 % SODIUM CHLORIDE (POUR BTL) OPTIME
TOPICAL | Status: DC | PRN
  Administered 2014-07-03: 1000 mL

## 2014-07-03 MED ORDER — MIDAZOLAM HCL 5 MG/5ML IJ SOLN
INTRAMUSCULAR | Status: DC | PRN
  Administered 2014-07-03: 2 mg via INTRAVENOUS

## 2014-07-03 MED ORDER — LIDOCAINE HCL (CARDIAC) 20 MG/ML IV SOLN
INTRAVENOUS | Status: AC
  Filled 2014-07-03: qty 5

## 2014-07-03 MED ORDER — ROCURONIUM BROMIDE 100 MG/10ML IV SOLN
INTRAVENOUS | Status: DC | PRN
  Administered 2014-07-03: 40 mg via INTRAVENOUS

## 2014-07-03 MED ORDER — ONDANSETRON HCL 4 MG/2ML IJ SOLN
4.0000 mg | Freq: Four times a day (QID) | INTRAMUSCULAR | Status: DC | PRN
  Administered 2014-07-03: 4 mg via INTRAVENOUS
  Filled 2014-07-03: qty 2

## 2014-07-03 MED ORDER — ALUM & MAG HYDROXIDE-SIMETH 200-200-20 MG/5ML PO SUSP
30.0000 mL | ORAL | Status: DC | PRN
  Administered 2014-07-04 – 2014-07-06 (×5): 30 mL via ORAL
  Filled 2014-07-03 (×5): qty 30

## 2014-07-03 MED ORDER — ONDANSETRON HCL 4 MG/2ML IJ SOLN
INTRAMUSCULAR | Status: DC | PRN
  Administered 2014-07-03: 4 mg via INTRAVENOUS

## 2014-07-03 MED ORDER — ONDANSETRON HCL 4 MG/2ML IJ SOLN
INTRAMUSCULAR | Status: AC
  Filled 2014-07-03: qty 2

## 2014-07-03 MED ORDER — EPHEDRINE SULFATE 50 MG/ML IJ SOLN
INTRAMUSCULAR | Status: AC
  Filled 2014-07-03: qty 1

## 2014-07-03 MED ORDER — RIVAROXABAN 10 MG PO TABS
10.0000 mg | ORAL_TABLET | Freq: Every day | ORAL | Status: DC
  Administered 2014-07-04 – 2014-07-06 (×3): 10 mg via ORAL
  Filled 2014-07-03 (×4): qty 1

## 2014-07-03 MED ORDER — DEXAMETHASONE SODIUM PHOSPHATE 10 MG/ML IJ SOLN
10.0000 mg | Freq: Three times a day (TID) | INTRAMUSCULAR | Status: DC
  Administered 2014-07-03 – 2014-07-06 (×9): 10 mg via INTRAVENOUS
  Filled 2014-07-03 (×14): qty 1

## 2014-07-03 MED ORDER — PROPOFOL 10 MG/ML IV BOLUS
INTRAVENOUS | Status: DC | PRN
  Administered 2014-07-03: 150 mg via INTRAVENOUS

## 2014-07-03 MED ORDER — GLYCOPYRROLATE 0.2 MG/ML IJ SOLN
INTRAMUSCULAR | Status: AC
  Filled 2014-07-03: qty 1

## 2014-07-03 MED ORDER — EPHEDRINE SULFATE 50 MG/ML IJ SOLN
INTRAMUSCULAR | Status: DC | PRN
  Administered 2014-07-03 (×3): 10 mg via INTRAVENOUS

## 2014-07-03 MED ORDER — POTASSIUM CHLORIDE IN NACL 20-0.9 MEQ/L-% IV SOLN
INTRAVENOUS | Status: DC
  Administered 2014-07-03: 16:00:00 via INTRAVENOUS
  Filled 2014-07-03 (×9): qty 1000

## 2014-07-03 MED ORDER — OXYCODONE HCL 5 MG PO TABS
5.0000 mg | ORAL_TABLET | ORAL | Status: DC | PRN
  Administered 2014-07-03: 5 mg via ORAL
  Administered 2014-07-04: 10 mg via ORAL
  Administered 2014-07-05: 5 mg via ORAL
  Administered 2014-07-05: 10 mg via ORAL
  Administered 2014-07-05 (×2): 5 mg via ORAL
  Administered 2014-07-06: 10 mg via ORAL
  Filled 2014-07-03 (×2): qty 2
  Filled 2014-07-03 (×2): qty 1
  Filled 2014-07-03 (×2): qty 2

## 2014-07-03 MED ORDER — ACETAMINOPHEN 325 MG PO TABS: 650.0000 mg | ORAL_TABLET | Freq: Four times a day (QID) | ORAL | Status: DC | PRN

## 2014-07-03 MED ORDER — FENTANYL CITRATE 0.05 MG/ML IJ SOLN
INTRAMUSCULAR | Status: AC
  Filled 2014-07-03: qty 5

## 2014-07-03 MED ORDER — INSULIN ASPART 100 UNIT/ML ~~LOC~~ SOLN
0.0000 [IU] | Freq: Three times a day (TID) | SUBCUTANEOUS | Status: DC
  Administered 2014-07-04 (×2): 3 [IU] via SUBCUTANEOUS
  Administered 2014-07-05: 2 [IU] via SUBCUTANEOUS
  Administered 2014-07-05: 3 [IU] via SUBCUTANEOUS
  Administered 2014-07-05: 8 [IU] via SUBCUTANEOUS
  Administered 2014-07-06: 3 [IU] via SUBCUTANEOUS
  Administered 2014-07-06: 5 [IU] via SUBCUTANEOUS
  Administered 2014-07-06: 3 [IU] via SUBCUTANEOUS

## 2014-07-03 MED ORDER — METOCLOPRAMIDE HCL 5 MG/ML IJ SOLN: 5.0000 mg | Freq: Three times a day (TID) | INTRAMUSCULAR | Status: DC | PRN

## 2014-07-03 MED ORDER — SODIUM CHLORIDE 0.9 % IR SOLN
Status: DC | PRN
  Administered 2014-07-03 (×2): 1000 mL

## 2014-07-03 MED ORDER — INSULIN ASPART 100 UNIT/ML ~~LOC~~ SOLN
0.0000 [IU] | Freq: Every day | SUBCUTANEOUS | Status: DC
  Administered 2014-07-04: 2 [IU] via SUBCUTANEOUS

## 2014-07-03 MED ORDER — NEOSTIGMINE METHYLSULFATE 10 MG/10ML IV SOLN
INTRAVENOUS | Status: DC | PRN
  Administered 2014-07-03: 4 mg via INTRAVENOUS

## 2014-07-03 MED ORDER — GLYCOPYRROLATE 0.2 MG/ML IJ SOLN
INTRAMUSCULAR | Status: DC | PRN
  Administered 2014-07-03: 0.6 mg via INTRAVENOUS

## 2014-07-03 MED ORDER — FENTANYL CITRATE 0.05 MG/ML IJ SOLN
INTRAMUSCULAR | Status: DC | PRN
  Administered 2014-07-03 (×3): 50 ug via INTRAVENOUS

## 2014-07-03 MED ORDER — DEXAMETHASONE SODIUM PHOSPHATE 10 MG/ML IJ SOLN
INTRAMUSCULAR | Status: AC
  Filled 2014-07-03: qty 1

## 2014-07-03 MED ORDER — LIDOCAINE HCL (CARDIAC) 20 MG/ML IV SOLN
INTRAVENOUS | Status: DC | PRN
  Administered 2014-07-03: 40 mg via INTRAVENOUS

## 2014-07-03 MED ORDER — PHENOL 1.4 % MT LIQD: 1.0000 | OROMUCOSAL | Status: DC | PRN

## 2014-07-03 MED ORDER — NEOSTIGMINE METHYLSULFATE 10 MG/10ML IV SOLN
INTRAVENOUS | Status: AC
  Filled 2014-07-03: qty 1

## 2014-07-03 MED ORDER — BUPIVACAINE-EPINEPHRINE (PF) 0.25% -1:200000 IJ SOLN
INTRAMUSCULAR | Status: AC
  Filled 2014-07-03: qty 30

## 2014-07-03 MED ORDER — HYDROMORPHONE HCL 1 MG/ML IJ SOLN
1.0000 mg | INTRAMUSCULAR | Status: DC | PRN
  Administered 2014-07-03 – 2014-07-05 (×4): 1 mg via INTRAVENOUS
  Filled 2014-07-03 (×4): qty 1

## 2014-07-03 MED ORDER — ACETAMINOPHEN 650 MG RE SUPP: 650.0000 mg | Freq: Four times a day (QID) | RECTAL | Status: DC | PRN

## 2014-07-03 MED ORDER — PROMETHAZINE HCL 25 MG/ML IJ SOLN: 6.2500 mg | INTRAMUSCULAR | Status: DC | PRN

## 2014-07-03 MED ORDER — BUPIVACAINE-EPINEPHRINE 0.25% -1:200000 IJ SOLN
INTRAMUSCULAR | Status: DC | PRN
  Administered 2014-07-03: 30 mL

## 2014-07-03 MED ORDER — DIPHENHYDRAMINE HCL 12.5 MG/5ML PO ELIX: 12.5000 mg | ORAL_SOLUTION | ORAL | Status: DC | PRN

## 2014-07-03 MED ORDER — PROPOFOL 10 MG/ML IV BOLUS
INTRAVENOUS | Status: AC
  Filled 2014-07-03: qty 20

## 2014-07-03 MED ORDER — MENTHOL 3 MG MT LOZG: 1.0000 | LOZENGE | OROMUCOSAL | Status: DC | PRN

## 2014-07-03 MED ORDER — MIDAZOLAM HCL 2 MG/2ML IJ SOLN
INTRAMUSCULAR | Status: AC
  Filled 2014-07-03: qty 2

## 2014-07-03 MED ORDER — LACTATED RINGERS IV SOLN
INTRAVENOUS | Status: DC
  Administered 2014-07-03: 10:00:00 via INTRAVENOUS

## 2014-07-03 MED ORDER — ONDANSETRON HCL 4 MG PO TABS: 4.0000 mg | ORAL_TABLET | Freq: Four times a day (QID) | ORAL | Status: DC | PRN

## 2014-07-03 MED ORDER — AMLODIPINE BESYLATE 5 MG PO TABS
5.0000 mg | ORAL_TABLET | Freq: Every day | ORAL | Status: DC
  Administered 2014-07-04 – 2014-07-06 (×3): 5 mg via ORAL
  Filled 2014-07-03 (×3): qty 1

## 2014-07-03 MED ORDER — MEPERIDINE HCL 25 MG/ML IJ SOLN: 6.2500 mg | INTRAMUSCULAR | Status: DC | PRN

## 2014-07-03 MED ORDER — FLUTICASONE PROPIONATE 50 MCG/ACT NA SUSP
2.0000 | Freq: Two times a day (BID) | NASAL | Status: DC
  Administered 2014-07-03 – 2014-07-06 (×6): 2 via NASAL
  Filled 2014-07-03: qty 16

## 2014-07-03 SURGICAL SUPPLY — 74 items
BANDAGE ESMARK 6X9 LF (GAUZE/BANDAGES/DRESSINGS) ×1 IMPLANT
BENZOIN TINCTURE PRP APPL 2/3 (GAUZE/BANDAGES/DRESSINGS) ×3 IMPLANT
BLADE SAGITTAL 25.0X1.19X90 (BLADE) ×2 IMPLANT
BLADE SAGITTAL 25.0X1.19X90MM (BLADE) ×1
BLADE SAW SGTL 13.0X1.19X90.0M (BLADE) ×3 IMPLANT
BLADE SURG 10 STRL SS (BLADE) ×6 IMPLANT
BNDG ELASTIC 6X15 VLCR STRL LF (GAUZE/BANDAGES/DRESSINGS) ×3 IMPLANT
BNDG ESMARK 6X9 LF (GAUZE/BANDAGES/DRESSINGS) ×3
BOWL SMART MIX CTS (DISPOSABLE) ×3 IMPLANT
CAPT RP KNEE ×3 IMPLANT
CEMENT HV SMART SET (Cement) ×6 IMPLANT
CHLORAPREP W/TINT 26ML (MISCELLANEOUS) ×6 IMPLANT
CLOSURE STERI-STRIP 1/2X4 (GAUZE/BANDAGES/DRESSINGS) ×1
CLOSURE WOUND 1/2 X4 (GAUZE/BANDAGES/DRESSINGS) ×1
CLSR STERI-STRIP ANTIMIC 1/2X4 (GAUZE/BANDAGES/DRESSINGS) ×2 IMPLANT
COVER SURGICAL LIGHT HANDLE (MISCELLANEOUS) ×3 IMPLANT
CUFF TOURNIQUET SINGLE 34IN LL (TOURNIQUET CUFF) ×3 IMPLANT
DRAPE EXTREMITY T 121X128X90 (DRAPE) ×3 IMPLANT
DRAPE IMP U-DRAPE 54X76 (DRAPES) ×3 IMPLANT
DRAPE INCISE IOBAN 66X45 STRL (DRAPES) ×3 IMPLANT
DRAPE PROXIMA HALF (DRAPES) ×3 IMPLANT
DRAPE U-SHAPE 47X51 STRL (DRAPES) ×3 IMPLANT
DRSG AQUACEL AG ADV 3.5X14 (GAUZE/BANDAGES/DRESSINGS) ×3 IMPLANT
DRSG PAD ABDOMINAL 8X10 ST (GAUZE/BANDAGES/DRESSINGS) ×3 IMPLANT
ELECT CAUTERY BLADE 6.4 (BLADE) ×3 IMPLANT
ELECT REM PT RETURN 9FT ADLT (ELECTROSURGICAL) ×3
ELECTRODE REM PT RTRN 9FT ADLT (ELECTROSURGICAL) ×1 IMPLANT
EVACUATOR 1/8 PVC DRAIN (DRAIN) ×3 IMPLANT
FACESHIELD WRAPAROUND (MASK) ×3 IMPLANT
GAUZE SPONGE 4X4 12PLY STRL (GAUZE/BANDAGES/DRESSINGS) ×3 IMPLANT
GLOVE BIO SURGEON STRL SZ7 (GLOVE) ×3 IMPLANT
GLOVE BIOGEL M 6.5 STRL (GLOVE) ×6 IMPLANT
GLOVE BIOGEL PI IND STRL 6.5 (GLOVE) ×2 IMPLANT
GLOVE BIOGEL PI IND STRL 7.0 (GLOVE) ×1 IMPLANT
GLOVE BIOGEL PI IND STRL 7.5 (GLOVE) ×1 IMPLANT
GLOVE BIOGEL PI INDICATOR 6.5 (GLOVE) ×4
GLOVE BIOGEL PI INDICATOR 7.0 (GLOVE) ×2
GLOVE BIOGEL PI INDICATOR 7.5 (GLOVE) ×2
GLOVE SS BIOGEL STRL SZ 7.5 (GLOVE) ×1 IMPLANT
GLOVE SUPERSENSE BIOGEL SZ 7.5 (GLOVE) ×2
GOWN STRL REUS W/ TWL LRG LVL3 (GOWN DISPOSABLE) ×2 IMPLANT
GOWN STRL REUS W/ TWL XL LVL3 (GOWN DISPOSABLE) ×2 IMPLANT
GOWN STRL REUS W/TWL LRG LVL3 (GOWN DISPOSABLE) ×4
GOWN STRL REUS W/TWL XL LVL3 (GOWN DISPOSABLE) ×4
HANDPIECE INTERPULSE COAX TIP (DISPOSABLE) ×2
HOOD PEEL AWAY FACE SHEILD DIS (HOOD) ×9 IMPLANT
IMMOBILIZER KNEE 22 UNIV (SOFTGOODS) ×3 IMPLANT
KIT BASIN OR (CUSTOM PROCEDURE TRAY) ×3 IMPLANT
KIT ROOM TURNOVER OR (KITS) ×3 IMPLANT
MANIFOLD NEPTUNE II (INSTRUMENTS) ×3 IMPLANT
MARKER SKIN DUAL TIP RULER LAB (MISCELLANEOUS) ×3 IMPLANT
NS IRRIG 1000ML POUR BTL (IV SOLUTION) ×3 IMPLANT
PACK TOTAL JOINT (CUSTOM PROCEDURE TRAY) ×3 IMPLANT
PACK UNIVERSAL I (CUSTOM PROCEDURE TRAY) IMPLANT
PAD ARMBOARD 7.5X6 YLW CONV (MISCELLANEOUS) ×6 IMPLANT
PADDING CAST ABS 6INX4YD NS (CAST SUPPLIES) ×2
PADDING CAST ABS COTTON 6X4 NS (CAST SUPPLIES) ×1 IMPLANT
PADDING CAST COTTON 6X4 STRL (CAST SUPPLIES) ×3 IMPLANT
RUBBERBAND STERILE (MISCELLANEOUS) ×3 IMPLANT
SET HNDPC FAN SPRY TIP SCT (DISPOSABLE) ×1 IMPLANT
SPONGE GAUZE 4X4 12PLY STER LF (GAUZE/BANDAGES/DRESSINGS) ×3 IMPLANT
STRIP CLOSURE SKIN 1/2X4 (GAUZE/BANDAGES/DRESSINGS) ×2 IMPLANT
SUCTION FRAZIER TIP 10 FR DISP (SUCTIONS) ×3 IMPLANT
SUT ETHIBOND NAB CT1 #1 30IN (SUTURE) ×6 IMPLANT
SUT MNCRL AB 3-0 PS2 18 (SUTURE) ×3 IMPLANT
SUT VIC AB 0 CT1 27 (SUTURE) ×4
SUT VIC AB 0 CT1 27XBRD ANBCTR (SUTURE) ×2 IMPLANT
SUT VIC AB 2-0 CT1 27 (SUTURE) ×4
SUT VIC AB 2-0 CT1 TAPERPNT 27 (SUTURE) ×2 IMPLANT
SYR 30ML SLIP (SYRINGE) ×3 IMPLANT
TOWEL OR 17X24 6PK STRL BLUE (TOWEL DISPOSABLE) ×3 IMPLANT
TOWEL OR 17X26 10 PK STRL BLUE (TOWEL DISPOSABLE) ×3 IMPLANT
TRAY FOLEY CATH 16FR SILVER (SET/KITS/TRAYS/PACK) ×3 IMPLANT
WATER STERILE IRR 1000ML POUR (IV SOLUTION) ×6 IMPLANT

## 2014-07-03 NOTE — Anesthesia Procedure Notes (Signed)
Anesthesia Regional Block:  Adductor canal block  Pre-Anesthetic Checklist: ,, timeout performed,, Correct Site,, Correct Procedure,, site marked,,, surgical consent,, at surgeon's request and post-op pain management  Laterality: Right  Prep: chloraprep       Needles:   Needle Type: Echogenic Stimulator Needle     Needle Length: 10cm 10 cm Needle Gauge: 22 and 22 G    Additional Needles:  Procedures: ultrasound guided (picture in chart) Adductor canal block Narrative:  Injection made incrementally with aspirations every 5 mL.  Additional Notes: R AD canal block, 27cc .5% marcaine with epi, multiple aspirations, talked to patient throughout, no complications, sterile technique

## 2014-07-03 NOTE — Progress Notes (Signed)
Orthopedic Tech Progress Note Patient Details:  Yvonne LandLynda I Todd [birth year] [id number] CPM applied to RLE with appropriate settings. OHf applied to bed. Footsie roll provided.  CPM Right Knee CPM Right Knee: On Right Knee Flexion (Degrees): 90 Right Knee Extension (Degrees): 0   Asia R Thompson 07/03/2014, 1:35 PM

## 2014-07-03 NOTE — Interval H&P Note (Signed)
History and Physical Interval Note:  07/03/2014 10:28 AM  Yvonne Todd  has presented today for surgery, with the diagnosis of primary localized OA djd right knee  The various methods of treatment have been discussed with the patient and family. After consideration of risks, benefits and other options for treatment, the patient has consented to  Procedure(s): RIGHT TOTAL KNEE ARTHROPLASTY (Right) as a surgical intervention .  The patient's history has been reviewed, patient examined, no change in status, stable for surgery.  I have reviewed the patient's chart and labs.  Questions were answered to the patient's satisfaction.     Salvatore MarvelWAINER,Khameron Gruenwald A

## 2014-07-03 NOTE — Anesthesia Postprocedure Evaluation (Signed)
  Anesthesia Post-op Note  Patient: Yvonne Todd  Procedure(s) Performed: Procedure(s): RIGHT TOTAL KNEE ARTHROPLASTY (Right)  Patient Location: PACU  Anesthesia Type:General  Level of Consciousness: awake  Airway and Oxygen Therapy: Patient Spontanous Breathing  Post-op Pain: mild  Post-op Assessment: Post-op Vital signs reviewed, Patient's Cardiovascular Status Stable, Respiratory Function Stable, RESPIRATORY FUNCTION UNSTABLE and No signs of Nausea or vomiting  Post-op Vital Signs: Reviewed and stable  Last Vitals:  Filed Vitals:   07/03/14 2003  BP: 177/69  Pulse: 67  Temp: 36.6 C  Resp:     Complications: No apparent anesthesia complications

## 2014-07-03 NOTE — Evaluation (Signed)
Physical Therapy Evaluation Patient Details Name: Yvonne Todd MRN: [id number] DOB: 1951/08/09 Today's Date: 07/03/2014   History of Present Illness  Patient is a 63 yo female admitted 07/03/14 now s/p Rt TKA.  PMH:  HTN, HLD  Clinical Impression  Patient presents with problems listed below.  Will benefit from acute PT to maximize independence prior to discharge to sister's home.  Recommend HHPT for continued therapy at discharge. Session limited today by nausea/vomiting.    Follow Up Recommendations Home health PT;Supervision/Assistance - 24 hour    Equipment Recommendations  Rolling walker with 5" wheels;3in1 (PT)    Recommendations for Other Services       Precautions / Restrictions Precautions Precautions: Knee;Fall Precaution Booklet Issued: Yes (comment) Precaution Comments: Reviewed precautions with patient. Required Braces or Orthoses: Knee Immobilizer - Right Knee Immobilizer - Right: On when out of bed or walking;Discontinue once straight leg raise with < 10 degree lag Restrictions Weight Bearing Restrictions: Yes RLE Weight Bearing: Weight bearing as tolerated      Mobility  Bed Mobility Overal bed mobility: Needs Assistance Bed Mobility: Supine to Sit;Sit to Supine     Supine to sit: Min assist Sit to supine: Min assist   General bed mobility comments: Instructed patient on donning KI on RLE.  Verbal cues for bed mobility.  Assist to bring RLE off of bed and to raise trunk to sitting.  Patient sat EOB x 8 minutes.  Patient nauseated and vomiting.  Returned to supine with assist to bring RLE onto bed.  Elevated HOB.  RN notified - meds for nausea.  Transfers                    Ambulation/Gait                Stairs            Wheelchair Mobility    Modified Rankin (Stroke Patients Only)       Balance Overall balance assessment: Needs assistance Sitting-balance support: No upper extremity supported;Feet supported Sitting  balance-Leahy Scale: Good                                       Pertinent Vitals/Pain Pain Assessment: 0-10 Pain Score: 1  Pain Location: Rt knee Pain Descriptors / Indicators: Throbbing Pain Intervention(s): Monitored during session;Repositioned    Home Living Family/patient expects to be discharged to:: Private residence (Patient reports insurance will not pay for SNF??) Living Arrangements: Alone Available Help at Discharge: Family;Available 24 hours/day (Going to sister's home.  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  Sister now will assist.    Prior Function Level of Independence: Independent with assistive device(s)         Comments: Uses cane outside and for longer distances.     Hand Dominance        Extremity/Trunk Assessment   Upper Extremity Assessment: Overall WFL for tasks assessed           Lower Extremity Assessment: RLE deficits/detail RLE Deficits / Details: Decreased strength and ROM post-op    Cervical / Trunk Assessment: Normal  Communication   Communication: No difficulties  Cognition Arousal/Alertness: Awake/alert Behavior During Therapy: WFL for  tasks assessed/performed Overall Cognitive Status: Within Functional Limits for tasks assessed                      General Comments      Exercises Total Joint Exercises Ankle Circles/Pumps: AROM;Both;10 reps;Seated      Assessment/Plan    PT Assessment Patient needs continued PT services  PT Diagnosis Difficulty walking;Acute pain   PT Problem List Decreased strength;Decreased range of motion;Decreased activity tolerance;Decreased balance;Decreased mobility;Decreased knowledge of use of DME;Decreased knowledge of precautions;Pain   PT Treatment Interventions DME instruction;Gait training;Stair training;Functional mobility training;Therapeutic activities;Therapeutic exercise;Patient/family education   PT Goals (Current goals can be found in the Care Plan section) Acute Rehab PT Goals Patient Stated Goal: To return home PT Goal Formulation: With patient Time For Goal Achievement: 07/10/14 Potential to Achieve Goals: Good    Frequency 7X/week   Barriers to discharge Decreased caregiver support 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  Will be going to sister's home with 24 hour supervision.  Sister is caring for their mother.    Co-evaluation               End of Session Equipment Utilized During Treatment: Right knee immobilizer;Oxygen Activity Tolerance: Patient limited by fatigue;Treatment limited secondary to medical complications (Comment) (Nausea/vomiting) Patient left: in bed;with call bell/phone within reach Nurse Communication: Mobility status (Request for nausea meds)         Time: [id number] PT Time Calculation (min): 24 min   Charges:   PT Evaluation $Initial PT Evaluation Tier I: 1 Procedure PT Treatments $Therapeutic Activity: 8-22 mins   PT G Codes:          Yvonne Todd, Yvonne Todd 07/03/2014, 6:27 PM Yvonne HurtSusan Todd. Yvonne Fiddleravis, PT, [facility] Pager [phone]

## 2014-07-03 NOTE — Plan of Care (Signed)
Problem: Phase I Progression Outcomes Goal: Dangle or out of bed evening of surgery Outcome: Completed/Met Date Met:  07/03/14

## 2014-07-03 NOTE — Op Note (Signed)
MRN:     [id number] DOB/AGE:    [birth year] / 64 y.o.       OPERATIVE REPORT    DATE OF PROCEDURE:  07/03/2014       PREOPERATIVE DIAGNOSIS:   Primary localized OA right knee      Estimated body mass index is 32.55 kg/(m^2) as calculated from the following:   Height as of this encounter: 5\' 2"  (1.575 m).   Weight as of this encounter: 80.74 kg (178 lb).                                                        POSTOPERATIVE DIAGNOSIS:   Primary localized OA right knee                                                                      PROCEDURE:  Procedure(s): RIGHT TOTAL KNEE ARTHROPLASTY Using Depuy Sigma RP implants #2.5 Femur, #2.5Tibia, 10mm sigma RP bearing, 32 Patella     SURGEON: Antonyo Hinderer Todd    ASSISTANT:  Kirstin Shepperson PA-C   (Present and scrubbed throughout the case, critical for assistance with exposure, retraction, instrumentation, and closure.)         ANESTHESIA: GET with Femoral Nerve Block  DRAINS: foley, 2 medium hemovac in knee   TOURNIQUET TIME: 75min   COMPLICATIONS:  None     SPECIMENS: None   INDICATIONS FOR PROCEDURE: The patient has  djd right knee, varus deformities, XR shows bone on bone arthritis. Patient has failed all conservative measures including anti-inflammatory medicines, narcotics, attempts at  exercise and weight loss, cortisone injections and viscosupplementation.  Risks and benefits of surgery have been discussed, questions answered.   DESCRIPTION OF PROCEDURE: The patient identified by armband, received  right femoral nerve block and IV antibiotics, in [facility] holding [facility]. Patient taken to the operating room, appropriate anesthetic  monitors were attached General endotracheal anesthesia induced with  the patient in supine position, Foley catheter was inserted. Tourniquet  applied high to the operative thigh. Lateral post and foot positioner  applied to the table, the lower extremity was then prepped and draped  in  usual sterile fashion from the ankle to the tourniquet. Time-out procedure was performed. The limb was wrapped with an Esmarch bandage and the tourniquet inflated to 365 mmHg. We began the operation by making the anterior midline incision starting at handbreadth above the patella going over the patella 1 cm medial to and  4 cm distal to the tibial tubercle. Small bleeders in the skin and the  subcutaneous tissue identified and cauterized. Transverse retinaculum was incised and reflected medially and Todd medial parapatellar arthrotomy was accomplished. the patella was everted and theprepatellar fat pad resected. The superficial medial collateral  ligament was then elevated from anterior to posterior along the proximal  flare of the tibia and anterior half of the menisci resected. The knee was hyperflexed exposing bone on bone arthritis. Peripheral and notch osteophytes as well as the cruciate ligaments were then resected. We continued to  work our way around posteriorly along  the proximal tibia, and externally  rotated the tibia subluxing it out from underneath the femur. Todd McHale  retractor was placed through the notch and Todd lateral Hohmann retractor  placed, and we then drilled through the proximal tibia in line with the  axis of the tibia followed by an intramedullary guide rod and 2-degree  posterior slope cutting guide. The tibial cutting guide was pinned into place  allowing resection of 4 mm of bone medially and about 6 mm of bone  laterally because of her varus deformity. Satisfied with the tibial resection, we then  entered the distal femur 2 mm anterior to the PCL origin with the  intramedullary guide rod and applied the distal femoral cutting guide  set at 11mm, with 5 degrees of valgus. This was pinned along the  epicondylar axis. At this point, the distal femoral cut was accomplished without difficulty. We then sized for Todd #2.5 femoral component and pinned the guide in 3 degrees of external  rotation.The chamfer cutting guide was pinned into place. The anterior, posterior, and chamfer cuts were accomplished without difficulty followed by  the Sigma RP box cutting guide and the box cut. We also removed posterior osteophytes from the posterior femoral condyles. At this  time, the knee was brought into full extension. We checked our  extension and flexion gaps and found them symmetric at 10mm.  The patella thickness measured at 19 mm. We set the cutting guide at 11 and removed the posterior 8 mm  of the patella sized for 32 button and drilled the lollipop. The knee  was then once again hyperflexed exposing the proximal tibia. We sized for Todd #2.5 tibial base plate, applied the smokestack and the conical reamer followed by the the Delta fin keel punch. We then hammered into place the Sigma RP trial femoral component, inserted Todd 10-mm trial bearing, trial patellar button, and took the knee through range of motion from 0-130 degrees. No thumb pressure was required for patellar  tracking. At this point, all trial components were removed, Todd double batch of DePuy HV cement  was mixed and applied to all bony metallic mating surfaces except for the posterior condyles of the femur itself. In order, we  hammered into place the tibial tray and removed excess cement, the femoral component and removed excess cement, Todd 10-mm Sigma RP bearing  was inserted, and the knee brought to full extension with compression.  The patellar button was clamped into place, and excess cement  removed. While the cement cured the wound was irrigated out with normal saline solution pulse lavage, and medium Hemovac drains were placed.. Ligament stability and patellar tracking were checked and found to be excellent. The tourniquet was then released and hemostasis was obtained with cautery. The parapatellar arthrotomy was closed with  #1 ethibond suture. The subcutaneous tissue with 0 and 2-0 undyed  Vicryl suture, and 4-0  Monocryl.. Todd dressing of Xeroform,  4 x 4, dressing sponges, Webril, and Ace wrap applied. Needle and sponge count were correct times 2.The patient awakened, extubated, and taken to recovery room without difficulty. Vascular status was normal, pulses 2+ and symmetric.   Yvonne Todd 07/03/2014, 12:02 PM

## 2014-07-03 NOTE — Transfer of Care (Signed)
Immediate Anesthesia Transfer of Care Note  Patient: Yvonne Todd  Procedure(s) Performed: Procedure(s): RIGHT TOTAL KNEE ARTHROPLASTY (Right)  Patient Location: PACU  Anesthesia Type:General  Level of Consciousness: awake, alert  and oriented  Airway & Oxygen Therapy: Patient Spontanous Breathing and Patient connected to face mask oxygen  Post-op Assessment: Report given to PACU RN, Post -op Vital signs reviewed and stable and Patient moving all extremities X 4  Post vital signs: Reviewed and stable  Complications: No apparent anesthesia complications

## 2014-07-03 NOTE — Progress Notes (Signed)
Utilization review completed.  

## 2014-07-04 DIAGNOSIS — R7981 Abnormal blood-gas level: Secondary | ICD-10-CM | POA: Diagnosis not present

## 2014-07-04 LAB — BASIC METABOLIC PANEL
Anion gap: 8 (ref 5–15)
BUN: 9 mg/dL (ref 6–23)
CO2: 32 mEq/L (ref 19–32)
Calcium: 8.5 mg/dL (ref 8.4–10.5)
Chloride: 102 mEq/L (ref 96–112)
Creatinine, Ser: 0.51 mg/dL (ref 0.50–1.10)
GFR calc Af Amer: >90 mL/min (ref >90)
GFR calc non Af Amer: >90 mL/min (ref >90)
Glucose, Bld: 145 mg/dL — ABNORMAL HIGH (ref 70–99)
Potassium: 4.5 mEq/L (ref 3.7–5.3)
Sodium: 142 mEq/L (ref 137–147)

## 2014-07-04 LAB — CBC
HCT: 32.3 % — ABNORMAL LOW (ref 36.0–46.0)
Hemoglobin: 10.1 g/dL — ABNORMAL LOW (ref 12.0–15.0)
MCH: 26.3 pg (ref 26.0–34.0)
MCHC: 31.3 g/dL (ref 30.0–36.0)
MCV: 84.1 fL (ref 78.0–100.0)
Platelets: 282 10*3/uL (ref 150–400)
RBC: 3.84 MIL/uL — ABNORMAL LOW (ref 3.87–5.11)
RDW: 15.3 % (ref 11.5–15.5)
WBC: 9.3 10*3/uL (ref 4.0–10.5)

## 2014-07-04 LAB — GLUCOSE, CAPILLARY
GLUCOSE-CAPILLARY: 235 mg/dL — AB (ref 70–99)
Glucose-Capillary: 162 mg/dL — ABNORMAL HIGH (ref 70–99)

## 2014-07-04 MED ORDER — MOMETASONE FURO-FORMOTEROL FUM 100-5 MCG/ACT IN AERO
2.0000 | INHALATION_SPRAY | Freq: Two times a day (BID) | RESPIRATORY_TRACT | Status: DC
  Administered 2014-07-04 – 2014-07-06 (×4): 2 via RESPIRATORY_TRACT
  Filled 2014-07-04 (×2): qty 8.8

## 2014-07-04 MED ORDER — ALBUTEROL SULFATE (2.5 MG/3ML) 0.083% IN NEBU
2.5000 mg | INHALATION_SOLUTION | RESPIRATORY_TRACT | Status: DC
  Administered 2014-07-04 – 2014-07-05 (×4): 2.5 mg via RESPIRATORY_TRACT
  Filled 2014-07-04 (×3): qty 3

## 2014-07-04 NOTE — Plan of Care (Signed)
Problem: Phase I Progression Outcomes Goal: Pain controlled with appropriate interventions Outcome: Completed/Met Date Met:  07/04/14 Goal: Initial discharge plan identified Outcome: Completed/Met Date Met:  07/04/14

## 2014-07-04 NOTE — Plan of Care (Signed)
Problem: Phase I Progression Outcomes Goal: Initial discharge plan identified Outcome: Progressing     

## 2014-07-04 NOTE — Discharge Instructions (Signed)
Information on my medicine - XARELTO® (Rivaroxaban) ° °This medication education was reviewed with me or my healthcare representative as part of my discharge preparation.  The pharmacist that spoke with me during my hospital stay was:  Manly Nestle Donovan, RPH ° °Why was Xarelto® prescribed for you? °Xarelto® was prescribed for you to reduce the risk of blood clots forming after orthopedic surgery. The medical term for these abnormal blood clots is venous thromboembolism (VTE). ° °What do you need to know about xarelto® ? °Take your Xarelto® 10 mg ONCE DAILY at the same time every day. °You may take it either with or without food. ° °If you have difficulty swallowing the tablet whole, you may crush it and mix in applesauce just prior to taking your dose. ° °Take Xarelto® exactly as prescribed by your doctor and DO NOT stop taking Xarelto® without talking to the doctor who prescribed the medication.  Stopping without other VTE prevention medication to take the place of Xarelto® may increase your risk of developing a clot. ° °After discharge, you should have regular check-up appointments with your healthcare provider that is prescribing your Xarelto®.   ° °What do you do if you miss a dose? °If you miss a dose, take it as soon as you remember on the same day then continue your regularly scheduled once daily regimen the next day. Do not take two doses of Xarelto® on the same day.  ° °Important Safety Information °A possible side effect of Xarelto® is bleeding. You should call your healthcare provider right away if you experience any of the following: °  Bleeding from an injury or your nose that does not stop. °  Unusual colored urine (red or dark Otter) or unusual colored stools (red or black). °  Unusual bruising for unknown reasons. °  A serious fall or if you hit your head (even if there is no bleeding). ° °Some medicines may interact with Xarelto® and might increase your risk of bleeding while on Xarelto®. To  help avoid this, consult your healthcare provider or pharmacist prior to using any new prescription or non-prescription medications, including herbals, vitamins, non-steroidal anti-inflammatory drugs (NSAIDs) and supplements. ° °This website has more information on Xarelto®: www.xarelto.com. ° ° °

## 2014-07-04 NOTE — Clinical Social Work Psychosocial (Signed)
Clinical Social Work Department BRIEF PSYCHOSOCIAL ASSESSMENT 07/04/2014  Patient:  Yvonne Todd, Yvonne Todd     Account Number:  [id number]     Admit date:  07/03/2014  Clinical Social Worker:  Delrae Sawyers  Date/Time:  07/04/2014 02:39 PM  Referred by:  Physician  Date Referred:  07/04/2014 Referred for  SNF Placement   Other Referral:   none.   Interview type:  Patient Other interview type:   Pt's sister, Yvonne Todd, present at bedside.    PSYCHOSOCIAL DATA Living Status:  ALONE Admitted from facility:   Level of care:   Primary support name:  Yvonne Todd Primary support relationship to patient:  SIBLING Degree of support available:   Adequate support system. Pt is caregiver for pt's mother.    CURRENT CONCERNS Current Concerns  Post-Acute Placement   Other Concerns:   none.    SOCIAL WORK ASSESSMENT / PLAN CSW received referral for possible SNF placement at time of discharge. CSW met with pt and pt's sister at bedside to discuss discharge disposition. Pt informed CSW pt is primary caregiver for pt's mother. Pt stated she would prefer short-term rehabilitation at time of discharge. CSW discussed with pt possibility of BCBS denying SNF placement. Pt stated understanding. CSW to continue to follow and assist with discharge planning needs.   Assessment/plan status:  Psychosocial Support/Ongoing Assessment of Needs Other assessment/ plan:   none.   Information/referral to community resources:   [facility] bed offers.    PATIENT'S/FAMILY'S RESPONSE TO PLAN OF CARE: Pt understanding and agreeable to CSW plan of care. Pt expressed no further questions or concerns at this time.       [name], [place] ([phone]) Licensed Clinical Social Worker Orthopedics [phone]) and Surgical [phone])

## 2014-07-04 NOTE — Clinical Social Work Placement (Addendum)
Clinical Social Work Department CLINICAL SOCIAL WORK PLACEMENT NOTE 07/04/2014  Patient:  Yvonne Todd,Yvonne Todd  Account Number:  [id number] Admit date:  07/03/2014  Clinical Social Worker:  Mosie EpsteinEMILY S Colm Lyford, LCSWA  Date/time:  07/04/2014 02:44 PM  Clinical Social Work is seeking post-discharge placement for this patient at the following level of care:   SKILLED NURSING   (*CSW will update this form in Epic as items are completed)   07/04/2014  Patient/family provided with [facility] Department of Clinical Social Work's list of facilities offering this level of care within the geographic area requested by the patient (or if unable, by the patient's family).  07/04/2014  Patient/family informed of their freedom to choose among providers that offer the needed level of care, that participate in Medicare, Medicaid or managed care program needed by the patient, have an available bed and are willing to accept the patient.  07/04/2014  Patient/family informed of [facility]' ownership interest in [facility], as well as of the fact that they are under no obligation to receive care at this facility.  PASARR submitted to EDS on 07/04/2014 PASARR number received on 07/04/2014  FL2 transmitted to all facilities in geographic area requested by pt/family on  07/04/2014 FL2 transmitted to all facilities within larger geographic area on   Patient informed that his/her managed care company has contracts with or will negotiate with  certain facilities, including the following:     Patient/family informed of bed offers received:  07/05/2014 [facility] Physician recommends and patient chooses bed at    Patient [facility] on  07/06/2014 Patient to be transferred to facility by [facility] Patient and family notified of transfer on  07/06/2014 Name of family member notified:  Pt notified at bedside.  The following physician request were entered in  Epic:   Additional Comments:  Lily Kochermily Stephenson Cichy, LCSWA [phone]) Licensed Clinical Social Worker Orthopedics [phone]([id number]) and Surgical [phone]([id number])

## 2014-07-04 NOTE — Progress Notes (Signed)
Chest congestion. Respiratory therapy in to see patient

## 2014-07-04 NOTE — Progress Notes (Signed)
Subjective: 1 Day Post-Op Procedure(s) (LRB): RIGHT TOTAL KNEE ARTHROPLASTY (Right) Patient reports pain as 5 on 0-10 scale.    Objective: Vital signs in last 24 hours: Temp:  [97.1 F (36.2 C)-98.6 F (37 C)] 98.4 F (36.9 C) (11/03 0602) Pulse Rate:  [55-73] 58 (11/03 0602) Resp:  [10-20] 16 (11/03 0400) BP: (106-177)/(41-87) 106/87 mmHg (11/03 0602) SpO2:  [93 %-100 %] 93 % (11/03 0602) Weight:  [80.74 kg (178 lb)] 80.74 kg (178 lb) (11/02 0950)  Intake/Output from previous day: 11/02 0701 - 11/03 0700 In: 1430 [P.O.:530; I.V.:900] Out: 1775 [Urine:1400; Drains:325; Blood:50] Intake/Output this shift:     Recent Labs  07/04/14 0627  HGB 10.1*    Recent Labs  07/04/14 0627  WBC 9.3  RBC 3.84*  HCT 32.3*  PLT 282    Recent Labs  07/04/14 0627  NA 142  K 4.5  CL 102  CO2 32  BUN 9  CREATININE 0.51  GLUCOSE 145*  CALCIUM 8.5   No results for input(s): LABPT, INR in the last 72 hours.  Neurologically intact ABD soft Neurovascular intact Sensation intact distally Intact pulses distally Dorsiflexion/Plantar flexion intact Incision: dressing C/D/I  Chest Congestion with O2 sats in the 80's   Will schedule albuterol and start Dulera  Assessment/Plan: 1 Day Post-Op Procedure(s) (LRB): RIGHT TOTAL KNEE ARTHROPLASTY (Right)  Principal Problem:   Primary localized osteoarthritis of right knee Active Problems:   Tobacco use disorder   Left carotid bruit   Essential hypertension   Traumatic arthritis of right knee   DJD (degenerative joint disease) of knee   Low O2 saturation  Advance diet Up with therapy D/C IV fluids Discharge to SNF  [facility]PP[facility],Charizma Gardiner J 07/04/2014, 8:32 AM

## 2014-07-04 NOTE — Progress Notes (Signed)
Scheduled oxycontin

## 2014-07-04 NOTE — Evaluation (Signed)
Occupational Therapy Evaluation Patient Details Name: Yvonne Todd I Kole MRN: [id number] DOB: 05/20/1951 Today's Date: 07/04/2014    History of Present Illness Patient is a 63 yo female admitted 07/03/14 now s/p Rt TKA.  PMH:  HTN, HLD   Clinical Impression   Pt admitted with the above diagnoses and presents with below problem list. PTA pt was mod I with ADLs. Currently pt is at min guard level for LB ADLs and functional transfers and mobility. Pt is cg for mother who sister will now provide assist for; very limited assist available for pt at d/c. Recommending SNF for further rehab prior to d/c home.    Follow Up Recommendations  SNF    Equipment Recommendations  Other (comment) (defer to next venue)    Recommendations for Other Services       Precautions / Restrictions Precautions Precautions: Knee;Fall Precaution Booklet Issued: Yes (comment) Precaution Comments: reviewed precautions Required Braces or Orthoses: Knee Immobilizer - Right Knee Immobilizer - Right: On when out of bed or walking;Discontinue once straight leg raise with < 10 degree lag Restrictions Weight Bearing Restrictions: Yes RLE Weight Bearing: Weight bearing as tolerated      Mobility Bed Mobility Overal bed mobility: Needs Assistance Bed Mobility: Supine to Sit;Sit to Supine     Supine to sit: Min guard Sit to supine: Min guard   General bed mobility comments: min guard for bed mobility. Assist to don/doff KI prior to OOB.  Transfers Overall transfer level: Needs assistance Equipment used: Rolling walker (2 wheeled) Transfers: Sit to/from Stand Sit to Stand: Min guard         General transfer comment: close min guard, extra time and effort but no physical assist. cues for technique with rw.    Balance Overall balance assessment: Needs assistance Sitting-balance support: Feet supported;No upper extremity supported Sitting balance-Leahy Scale: Good     Standing balance support: Bilateral  upper extremity supported;During functional activity Standing balance-Leahy Scale: Fair                              ADL Overall ADL's : Needs assistance/impaired Eating/Feeding: Set up;Sitting   Grooming: Set up;Sitting;Standing   Upper Body Bathing: Set up;Sitting   Lower Body Bathing: Min guard;With adaptive equipment;Sit to/from stand   Upper Body Dressing : Set up;Sitting   Lower Body Dressing: Min guard;With adaptive equipment;Sit to/from stand   Toilet Transfer: Min guard;Ambulation;Comfort height toilet;Grab bars;RW   Toileting- ArchitectClothing Manipulation and Hygiene: Min guard;Sit to/from stand   Tub/ Shower Transfer: Min guard;Ambulation;3 in 1;Rolling walker   Functional mobility during ADLs: Min guard;Rolling walker General ADL Comments: ADL education provided to pt. Discussed home setup particularly with toilet and shower transfers.      Vision                     Perception     Praxis      Pertinent Vitals/Pain Pain Assessment: 0-10 Pain Score: 6  Pain Location: Rt knee Pain Descriptors / Indicators: Aching Pain Intervention(s): Limited activity within patient's tolerance;Monitored during session;Repositioned;RN gave pain meds during session;Utilized relaxation techniques     Hand Dominance     Extremity/Trunk Assessment Upper Extremity Assessment Upper Extremity Assessment: Overall WFL for tasks assessed   Lower Extremity Assessment Lower Extremity Assessment: Defer to PT evaluation       Communication Communication Communication: No difficulties   Cognition Arousal/Alertness: Awake/alert Behavior During Therapy: [facility] for tasks  assessed/performed Overall Cognitive Status: Within Functional Limits for tasks assessed                     General Comments       Exercises      Shoulder Instructions      Home Living Family/patient expects to be discharged to:: Private residence Living Arrangements: Alone Available  Help at Discharge: Family;Available 24 hours/day Type of Home: House Home Access: Stairs to enter Entergy CorporationEntrance Stairs-Number of Steps: 4 Entrance Stairs-Rails: Right;Left Home Layout: One level     Bathroom Shower/Tub: Producer, television/film/videoWalk-in shower   Bathroom Toilet: Standard Bathroom Accessibility: Yes How Accessible: Accessible via walker Home Equipment: Cane - single point;Shower seat - built in   Additional Comments: 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  Sister now will assist.      Prior Functioning/Environment Level of Independence: Independent with assistive device(s)        Comments: Uses cane outside and for longer distances.    OT Diagnosis: Acute pain   OT Problem List: Impaired balance (sitting and/or standing);Decreased knowledge of use of DME or AE;Decreased knowledge of precautions;Pain   OT Treatment/Interventions:      OT Goals(Current goals can be found in the care plan section) Acute Rehab OT Goals Patient Stated Goal: not stated  OT Frequency:     Barriers to D/C:            Co-evaluation              End of Session Equipment Utilized During Treatment: Gait belt;Rolling walker;Right knee immobilizer CPM Right Knee CPM Right Knee: On Right Knee Flexion (Degrees): 50 Right Knee Extension (Degrees): 0  Activity Tolerance: Patient tolerated treatment well;Patient limited by fatigue Patient left: in bed;in CPM;with call bell/phone within reach;with family/visitor present   Time: [id number] OT Time Calculation (min): 26 min Charges:  OT General Charges $OT Visit: 1 Procedure OT Evaluation $Initial OT Evaluation Tier I: 1 Procedure OT Treatments $Self Care/Home Management : 8-22 mins G-Codes:    Pilar GrammesMathews, Arsen Mangione H 07/04/2014, 2:02 PM

## 2014-07-04 NOTE — Plan of Care (Signed)
Problem: Phase II Progression Outcomes Goal: Ambulates Outcome: Completed/Met Date Met:  07/04/14 Goal: Tolerating diet Outcome: Completed/Met Date Met:  07/04/14 Goal: Discharge plan established Outcome: Progressing

## 2014-07-04 NOTE — Progress Notes (Signed)
OT Cancellation Note  Patient Details Name: Virgia LandLynda I Nees MRN: [id number] DOB: [birth year]   Cancelled Treatment:    Reason Eval/Treat Not Completed: Pain limiting ability to participate Pt declined due to 8.5/10 pain in Lt hip. Pt has requested pain med. OT to reattempt.  Pilar GrammesMathews, Rozanna Cormany H 07/04/2014, 9:29 AM

## 2014-07-04 NOTE — Plan of Care (Signed)
Problem: Phase III Progression Outcomes Goal: Ambulates Outcome: Completed/Met Date Met:  07/04/14 Goal: Incision clean - minimal/no drainage Outcome: Completed/Met Date Met:  07/04/14 Goal: Discharge plan remains appropriate-arrangements made Outcome: Completed/Met Date Met:  07/04/14 Goal: Anticoagulant follow-up in place Outcome: Completed/Met Date Met:  07/04/14     

## 2014-07-04 NOTE — Progress Notes (Signed)
PT Cancellation Note  Patient Details Name: Yvonne Todd MRN: [id number] DOB: [birth year]   Cancelled Treatment:    Reason Eval/Treat Not Completed: Other (comment) (pt vomiting).  RN made aware.  Pt requesting something for indigestion and nausea.  PT will check back later today as time allows.   Thanks,    Rollene Rotundaebecca B. Yasheka Fossett, PT, DPT [phone]   07/04/2014, 2:27 PM

## 2014-07-04 NOTE — Plan of Care (Signed)
Problem: Phase I Progression Outcomes Goal: Initial discharge plan identified Outcome: Completed/Met Date Met:  07/04/14     

## 2014-07-04 NOTE — Plan of Care (Signed)
Problem: Consults Goal: Total Joint Replacement Patient Education See Patient Education Module for education specifics.  Outcome: Progressing Goal: Diagnosis- Total Joint Replacement Outcome: Progressing Goal: Skin Care Protocol Initiated - if Braden Score 18 or less If consults are not indicated, leave blank or document N/A  Outcome: Progressing Goal: Nutrition Consult-if indicated Outcome: Progressing Goal: Diabetes Guidelines if Diabetic/Glucose > 140 If diabetic or lab glucose is > 140 mg/dl - Initiate Diabetes/Hyperglycemia Guidelines & Document Interventions  Outcome: Progressing  Problem: Phase I Progression Outcomes Goal: CMS/Neurovascular status WDL Outcome: Progressing Goal: Pain controlled with appropriate interventions Outcome: Progressing Goal: Initial discharge plan identified Outcome: Progressing Goal: Hemodynamically stable Outcome: Progressing Goal: Other Phase I Outcomes/Goals Outcome: Progressing  Problem: Phase II Progression Outcomes Goal: Ambulates Outcome: Progressing Goal: Tolerating diet Outcome: Progressing Goal: Discharge plan established Outcome: Progressing Goal: Other Phase II Outcomes/Goals Outcome: Progressing  Problem: Phase III Progression Outcomes Goal: Pain controlled on oral analgesia Outcome: Progressing Goal: Ambulates Outcome: Progressing Goal: Incision clean - minimal/no drainage Outcome: Progressing Goal: Discharge plan remains appropriate-arrangements made Outcome: Progressing Goal: Anticoagulant follow-up in place Outcome: Progressing Goal: Other Phase III Outcomes/Goals Outcome: Progressing  Problem: Discharge Progression Outcomes Goal: Barriers To Progression Addressed/Resolved Outcome: Progressing Goal: CMS/Neurovascular status at or above baseline Outcome: Progressing Goal: Anticoagulant follow-up in place Outcome: Progressing Goal: Pain controlled with appropriate interventions Outcome: Progressing Goal:  Hemodynamically stable Outcome: Progressing Goal: Complications resolved/controlled Outcome: Progressing Goal: Tolerates diet Outcome: Progressing Goal: Activity appropriate for discharge plan Outcome: Progressing Goal: Ambulates safely using assistive device Outcome: Progressing Goal: Follows weight - bearing limitations Outcome: Progressing Goal: Discharge plan in place and appropriate Outcome: Progressing Goal: Negotiates stairs Outcome: Progressing Goal: Demonstrates ADLs as appropriate Outcome: Progressing Goal: Incision without S/S infection Outcome: Progressing Goal: Other Discharge Outcomes/Goals Outcome: Progressing

## 2014-07-04 NOTE — Plan of Care (Signed)
Problem: Phase II Progression Outcomes Goal: Discharge plan established Outcome: Completed/Met Date Met:  07/04/14

## 2014-07-04 NOTE — Progress Notes (Signed)
Physical Therapy Treatment Patient Details Name: Yvonne Todd MRN: [id number] DOB: [birth year] Today's Date: 07/04/2014    History of Present Illness Patient is a 63 yo female admitted 07/03/14 now s/p Rt TKA.  PMH:  HTN, HLD    PT Comments    Pt is POD #1 and is moving well.  She continues to need hands on assist due to balance and safety when up on her feet.  Her discharge plan has changed as apparently her mother also had surgery this week and is not doing well, so her sister can no longer care for the patient full time and needs to go care for her mother full time instead.  Pt is appropriate for SNF level rehab at discharge to prepare her to go home at a modified independent level.  PT will continue to follow acutely.   Follow Up Recommendations  SNF     Equipment Recommendations  Rolling walker with 5" wheels;3in1 (PT)    Recommendations for Other Services   NA     Precautions / Restrictions Precautions Precautions: Knee;Fall Precaution Booklet Issued: Yes (comment) Precaution Comments: reviewed knee precautions, WB status, and KI use.  Required Braces or Orthoses: Knee Immobilizer - Right Knee Immobilizer - Right: On when out of bed or walking;Discontinue once straight leg raise with < 10 degree lag Restrictions Weight Bearing Restrictions: Yes RLE Weight Bearing: Weight bearing as tolerated    Mobility   Transfers Overall transfer level: Needs assistance Equipment used: Rolling walker (2 wheeled) Transfers: Sit to/from Stand Sit to Stand: Min guard         General transfer comment: Min guard assist to support trunk for balance during transitions from recliner chair and from toilet.  Pt with heavy reliance on her upper extremities for support and uncontrolled descent to sit.   Ambulation/Gait Ambulation/Gait assistance: Min guard Ambulation Distance (Feet): 85 Feet Assistive device: Rolling walker (2 wheeled) Gait Pattern/deviations: Step-through  pattern;Antalgic     General Gait Details: Min guard assist for safety and balance.  Verbal cues for upright posture and correct use of RW, especially while turning.            Balance Overall balance assessment: Needs assistance Sitting-balance support: Feet supported;No upper extremity supported Sitting balance-Leahy Scale: Good     Standing balance support: Bilateral upper extremity supported;Single extremity supported;No upper extremity supported Standing balance-Leahy Scale: Fair                      Cognition Arousal/Alertness: Awake/alert Behavior During Therapy: WFL for tasks assessed/performed Overall Cognitive Status: Within Functional Limits for tasks assessed                      Exercises Total Joint Exercises Ankle Circles/Pumps: AROM;Both;10 reps;Seated Quad Sets: AROM;Right;10 reps;Seated Towel Squeeze: AROM;Both;10 reps;Seated Short Arc Quad: AROM;Right;10 reps;Seated Heel Slides: AAROM;Right;10 reps;Seated Hip ABduction/ADduction: AROM;Right;10 reps;Seated Straight Leg Raises: AAROM;Right;10 reps;Seated Long Arc Quad: AROM;Right;10 reps;Seated        Pertinent Vitals/Pain Pain Assessment: 0-10 Pain Score: 6  Pain Location: knee right Pain Descriptors / Indicators: Aching Pain Intervention(s): Limited activity within patient's tolerance;Monitored during session;Repositioned           PT Goals (current goals can now be found in the care plan section) Acute Rehab PT Goals Patient Stated Goal: To return home Progress towards PT goals: Progressing toward goals    Frequency  7X/week    PT Plan Discharge plan needs to be  updated       End of Session Equipment Utilized During Treatment: Right knee immobilizer Activity Tolerance: Patient limited by fatigue;Patient limited by pain Patient left: in chair;with call bell/phone within reach;with family/visitor present     Time: [id number] PT Time Calculation (min): 34  min  Charges:  $Gait Training: 8-22 mins $Therapeutic Exercise: 8-22 mins              Jahrel Borthwick B. Vermelle Cammarata, PT, DPT [phone]   07/04/2014, 1:40 PM

## 2014-07-04 NOTE — Progress Notes (Signed)
Physical Therapy Treatment Patient Details Name: Yvonne Todd MRN: [id number] DOB: June 12, 1951 Today's Date: 07/04/2014    History of Present Illness Patient is a 63 yo female admitted 07/03/14 now s/p Rt TKA.  PMH:  HTN, HLD    PT Comments    Pt limited this PM by N/V, but agreeable to do in bed exercises for R knee.  Progressing well with her exercises and has been walking to the bathroom and back with staff several times this PM.  PT will follow up in the AM when the pt is feeling better.   Follow Up Recommendations  SNF     Equipment Recommendations  Rolling walker with 5" wheels;3in1 (PT)    Recommendations for Other Services   NA     Precautions / Restrictions Precautions Precautions: Knee;Fall Precaution Booklet Issued: Yes (comment) Precaution Comments: reviewed precautions Required Braces or Orthoses: Knee Immobilizer - Right Knee Immobilizer - Right: On when out of bed or walking;Discontinue once straight leg raise with < 10 degree lag Restrictions Weight Bearing Restrictions: Yes RLE Weight Bearing: Weight bearing as tolerated          Cognition Arousal/Alertness: Awake/alert Behavior During Therapy: WFL for tasks assessed/performed Overall Cognitive Status: Within Functional Limits for tasks assessed                      Exercises Total Joint Exercises Ankle Circles/Pumps: AROM;Both;10 reps;Seated Quad Sets: AROM;Right;10 reps;Seated Towel Squeeze: AROM;Both;10 reps;Seated Short Arc Quad: AROM;Right;10 reps;Seated Heel Slides: AAROM;Right;10 reps;Seated Hip ABduction/ADduction: AROM;Right;10 reps;Seated Straight Leg Raises: AAROM;Right;10 reps;Seated Long Arc Quad: AROM;Right;10 reps;Seated        Pertinent Vitals/Pain Pain Assessment: 0-10 Pain Score: 6  Pain Location: right knee with knee exercises Pain Descriptors / Indicators: Aching Pain Intervention(s): Limited activity within patient's tolerance;Monitored during session;Repositioned     Home Living Family/patient expects to be discharged to:: Private residence Living Arrangements: Alone Available Help at Discharge: Family;Available 24 hours/day Type of Home: House Home Access: Stairs to enter Entrance Stairs-Rails: Right;Left Home Layout: One level Home Equipment: Cane - single point;Shower seat - built in Additional Comments: 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  Sister now will assist.    Prior Function Level of Independence: Independent with assistive device(s)      Comments: Uses cane outside and for longer distances.   PT Goals (current goals can now be found in the care plan section) Acute Rehab PT Goals Patient Stated Goal: to get better and get back home Progress towards PT goals: Not progressing toward goals - comment (limited by n/v this afternoon)    Frequency  7X/week    PT Plan Current plan remains appropriate       End of Session Equipment Utilized During Treatment: Right knee immobilizer Activity Tolerance: Other (comment) (limited by nausea) Patient left: in bed;with call bell/phone within reach     Time: [id number] PT Time Calculation (min): 11 min  Charges:  $Gait Training: 8-22 mins $Therapeutic Exercise: 8-22 mins                      Deletha Jaffee B. Orlyn Odonoghue, PT, DPT [phone]   07/04/2014, 5:00 PM

## 2014-07-05 LAB — GLUCOSE, CAPILLARY
GLUCOSE-CAPILLARY: 182 mg/dL — AB (ref 70–99)
GLUCOSE-CAPILLARY: 173 mg/dL — AB (ref 70–99)
GLUCOSE-CAPILLARY: 193 mg/dL — AB (ref 70–99)
Glucose-Capillary: 256 mg/dL — ABNORMAL HIGH (ref 70–99)
Glucose-Capillary: 140 mg/dL — ABNORMAL HIGH (ref 70–99)

## 2014-07-05 LAB — BASIC METABOLIC PANEL
Anion gap: 8 (ref 5–15)
BUN: 10 mg/dL (ref 6–23)
CHLORIDE: 100 meq/L (ref 96–112)
CO2: 36 mEq/L — ABNORMAL HIGH (ref 19–32)
Calcium: 8.7 mg/dL (ref 8.4–10.5)
Creatinine, Ser: 0.46 mg/dL — ABNORMAL LOW (ref 0.50–1.10)
GFR calc Af Amer: >90 mL/min (ref >90)
GLUCOSE: 179 mg/dL — AB (ref 70–99)
POTASSIUM: 4.4 meq/L (ref 3.7–5.3)
SODIUM: 144 meq/L (ref 137–147)

## 2014-07-05 LAB — CBC
HEMATOCRIT: 30.4 % — AB (ref 36.0–46.0)
Hemoglobin: 9.4 g/dL — ABNORMAL LOW (ref 12.0–15.0)
MCH: 26.3 pg (ref 26.0–34.0)
MCHC: 30.9 g/dL (ref 30.0–36.0)
MCV: 84.9 fL (ref 78.0–100.0)
Platelets: 263 10*3/uL (ref 150–400)
RBC: 3.58 MIL/uL — ABNORMAL LOW (ref 3.87–5.11)
RDW: 15.5 % (ref 11.5–15.5)
WBC: 7.6 10*3/uL (ref 4.0–10.5)

## 2014-07-05 MED ORDER — PNEUMOCOCCAL VAC POLYVALENT 25 MCG/0.5ML IJ INJ
0.5000 mL | INJECTION | INTRAMUSCULAR | Status: AC
  Administered 2014-07-06: 0.5 mL via INTRAMUSCULAR
  Filled 2014-07-05: qty 0.5

## 2014-07-05 MED ORDER — ALBUTEROL SULFATE (2.5 MG/3ML) 0.083% IN NEBU
2.5000 mg | INHALATION_SOLUTION | Freq: Two times a day (BID) | RESPIRATORY_TRACT | Status: DC
  Administered 2014-07-05 – 2014-07-06 (×3): 2.5 mg via RESPIRATORY_TRACT
  Filled 2014-07-05 (×4): qty 3

## 2014-07-05 NOTE — Plan of Care (Signed)
Problem: Consults Goal: Total Joint Replacement Patient Education See Patient Education Module for education specifics.  Outcome: Completed/Met Date Met:  07/05/14 Goal: Diagnosis- Total Joint Replacement Outcome: Completed/Met Date Met:  07/05/14 Goal: Skin Care Protocol Initiated - if Braden Score 18 or less If consults are not indicated, leave blank or document N/A  Outcome: Not Applicable Date Met:  17/47/15 Goal: Nutrition Consult-if indicated Outcome: Not Applicable Date Met:  95/39/67 Goal: Diabetes Guidelines if Diabetic/Glucose > 140 If diabetic or lab glucose is > 140 mg/dl - Initiate Diabetes/Hyperglycemia Guidelines & Document Interventions  Outcome: Not Applicable Date Met:  28/97/91 Pt not diabetic. Pt blood sugars elevated due to IV steriods.   Problem: Phase I Progression Outcomes Goal: CMS/Neurovascular status WDL Outcome: Completed/Met Date Met:  07/05/14 Goal: Hemodynamically stable Outcome: Completed/Met Date Met:  07/05/14 Goal: Other Phase I Outcomes/Goals Outcome: Completed/Met Date Met:  07/05/14  Problem: Phase II Progression Outcomes Goal: Other Phase II Outcomes/Goals Outcome: Completed/Met Date Met:  07/05/14  Problem: Phase III Progression Outcomes Goal: Pain controlled on oral analgesia Outcome: Completed/Met Date Met:  07/05/14 Goal: Other Phase III Outcomes/Goals Outcome: Completed/Met Date Met:  07/05/14  Problem: Discharge Progression Outcomes Goal: Barriers To Progression Addressed/Resolved Outcome: Not Applicable Date Met:  50/41/36 Goal: CMS/Neurovascular status at or above baseline Outcome: Completed/Met Date Met:  07/05/14 Goal: Pain controlled with appropriate interventions Outcome: Completed/Met Date Met:  07/05/14 Goal: Tolerates diet Outcome: Completed/Met Date Met:  07/05/14

## 2014-07-05 NOTE — Progress Notes (Signed)
Physical Therapy Treatment Patient Details Name: Yvonne Todd MRN: [id number] DOB: [birth year] Today's Date: 07/05/2014    History of Present Illness Patient is a 63 yo female admitted 07/03/14 now s/p Rt TKA.  PMH:  HTN, HLD, hypoxia 06/2014    PT Comments    Pt is progressing with her mobility, however, her O2 sats are remaining low with gait even on 2 L O2 [place].  We will continue to use supplemental O2 as needed.  PT will continue to follow acutely and pt continues to be appropriate for SNF level rehab as she needs to be at mod I level to be safe at home.    Follow Up Recommendations  SNF     Equipment Recommendations  Rolling walker with 5" wheels;3in1 (PT)    Recommendations for Other Services   NA     Precautions / Restrictions Precautions Precautions: Knee;Fall Required Braces or Orthoses:  (d/c) Restrictions RLE Weight Bearing: Weight bearing as tolerated    Mobility  Bed Mobility Overal bed mobility: Needs Assistance Bed Mobility: Supine to Sit;Sit to Supine     Supine to sit: Supervision Sit to supine: Min guard   General bed mobility comments: Min guard assist to lift leg last 1/3 into the bed from sitting to supine.   Transfers Overall transfer level: Needs assistance Equipment used: Rolling walker (2 wheeled) Transfers: Sit to/from Stand Sit to Stand: Min guard         General transfer comment: Min guard assist for safety.  Reinforced safe hand placement during transitions.   Ambulation/Gait Ambulation/Gait assistance: Min guard Ambulation Distance (Feet): 120 Feet Assistive device: Rolling walker (2 wheeled) Gait Pattern/deviations: Step-through pattern;Antalgic Gait velocity: decreased Gait velocity interpretation: Below normal speed for age/gender General Gait Details: Pt requires cues to stay inside of RW during gait (especially while turining).  Min guard assist for safety.           Balance Overall balance assessment: Needs  assistance Sitting-balance support: Feet supported;No upper extremity supported Sitting balance-Leahy Scale: Good     Standing balance support: Bilateral upper extremity supported;Single extremity supported;No upper extremity supported Standing balance-Leahy Scale: Fair                      Cognition Arousal/Alertness: Awake/alert Behavior During Therapy: Impulsive Overall Cognitive Status: Within Functional Limits for tasks assessed (not specifically tested)                         General Comments General comments (skin integrity, edema, etc.): Pt's O2 sats 85% on 2 L O2 [place] during gait. Standing rest break and pursed lip breathing brough O2 sats back to 90% in less than 2 mins.        Pertinent Vitals/Pain Pain Assessment: 0-10 Pain Score: 6  Pain Location: right knee Pain Descriptors / Indicators: Aching Pain Intervention(s): Limited activity within patient's tolerance;Monitored during session;Repositioned;Premedicated before session           PT Goals (current goals can now be found in the care plan section) Acute Rehab PT Goals Patient Stated Goal: to get well enough that she is not as much of a burden on her sister who will be caring for her and her mother Progress towards PT goals: Progressing toward goals    Frequency  7X/week    PT Plan Discharge plan needs to be updated       End of Session Equipment Utilized During Treatment: Oxygen Activity  Tolerance: Patient limited by fatigue;Other (comment) (limited by decreased O2 sats) Patient left: in bed;in CPM;with call bell/phone within reach     Time: [id number] PT Time Calculation (min): 14 min  Charges:  $Gait Training: 8-22 mins                      Yvonne Todd B. Yvonne Todd, PT, DPT [phone]   07/05/2014, 4:29 PM

## 2014-07-05 NOTE — Clinical Social Work Note (Signed)
Pt has accepted bed offer [facility]. [facility]. CSW to continue to follow and assist with discharge planning needs.  Marcelline Deistmily Gale Klar, LCSWA [phone]) Licensed Clinical Social Worker Orthopedics [phone]([id number]) and Surgical [phone]([id number])

## 2014-07-05 NOTE — Progress Notes (Signed)
Physical Therapy Treatment Patient Details Name: Yvonne Todd MRN: [id number] DOB: May 09, 1951 Today's Date: 07/05/2014    History of Present Illness Patient is a 63 yo female admitted 07/03/14 now s/p Rt TKA.  PMH:  HTN, HLD, hypoxia 06/2014    PT Comments    Pt currently limited by decreased oxygen saturation at rest and with activity (despite supplemental O2 @ 2-3L). (see vital signs below). Pt lives alone and her plan for assistance on discharge has now fallen through. She does not have assistance on discharge (see details in comments below). She demonstrated impulsivity, poor awareness of her deficits (and need for O2 at all times currently--trying to remove prior to getting OOB), and would be at incr risk for fall if she continues to need oxygen at home (due to oxygen tubing and her poor safety awareness). Will need further therapy to increase her safe use of DME and for training in managing oxygen tubing while mobilizing.   Follow Up Recommendations  SNF     Equipment Recommendations  Rolling walker with 5" wheels;3in1 (PT)    Recommendations for Other Services       Precautions / Restrictions Precautions Precautions: Knee;Fall Required Braces or Orthoses:  (KI d/c'd ) Restrictions RLE Weight Bearing: Weight bearing as tolerated    Mobility  Bed Mobility Overal bed mobility: Needs Assistance Bed Mobility: Supine to Sit     Supine to sit: Min guard     General bed mobility comments: HOB 0, no rail; pt struggling to raise her torso and maneuver her RLE off EOB, near need for assist and near loss of balance posteriorly  Transfers Overall transfer level: Needs assistance Equipment used: Rolling walker (2 wheeled) Transfers: Sit to/from Stand Sit to Stand: Min assist         General transfer comment: again required vc for safe use of RW as pt tries to pull to stand; PT had to quickly anchor RW to prevent tipping  Ambulation/Gait Ambulation/Gait assistance: Min  guard Ambulation Distance (Feet): 120 Feet Assistive device: Rolling walker (2 wheeled) Gait Pattern/deviations: Step-to pattern;Decreased stride length;Decreased weight shift to right;Antalgic (hip hike on Rt; lacks full knee extension)   Gait velocity interpretation: Below normal speed for age/gender General Gait Details: Pt requires cues for extending through her RLE in stance and to bend knee in swing (tends to hip hike). First time walking without KI and more unsteady. Standing rest break x 1 to attempt to incr her SaO2--see below   Stairs            Wheelchair Mobility    Modified Rankin (Stroke Patients Only)       Balance                                    Cognition Arousal/Alertness: Awake/alert Behavior During Therapy: Impulsive   Area of Impairment: Attention;Safety/judgement   Current Attention Level: Selective (difficulty attending to more than one task)     Safety/Judgement: Decreased awareness of safety;Decreased awareness of deficits     General Comments: Pt reports removing her oxygen to go to the bathroom; attempting to intiate sit to stand prior to all line/tubes being out of her way    Exercises Total Joint Exercises Ankle Circles/Pumps: AROM;Both;10 reps Quad Sets: AROM;Both;10 reps Heel Slides: AAROM;Right;10 reps Long Arc Quad: AROM;Right;5 reps Knee Flexion: AROM;Right;5 reps;Seated Goniometric ROM: 5-70 knee flexion    General Comments General comments (  skin integrity, edema, etc.): Original plan was for pt to discharge to her sister's house where sister would care for both the patient and their mother. Her mother has since had a medical crisis requiring much more assistance and sister is no longer available to care for pt (she is struggling to care for their mother in fact).       Pertinent Vitals/Pain SaO2 at rest on 2L 91%; pt removed her oxygen to begin OOB "I really don't need this" At EOB sitting, SaO2 on room air  decr to 83%; 2L O2 resumed with incr to 90% after 90 seconds While walking on 2L SaO2 decr to 87% and incr to 3L O2 (unable to get SaO2 reading) On return to sitting, SaO2 88% on 3L and incr to 92% after 2 minutes.  Pain Assessment: 0-10 Pain Score: 6  Pain Location: Rt knee Pain Descriptors / Indicators: Aching;Sharp Pain Intervention(s): Limited activity within patient's tolerance;Monitored during session;Repositioned;Patient requesting pain meds-RN notified    Home Living                      Prior Function            PT Goals (current goals can now be found in the care plan section) Acute Rehab PT Goals Patient Stated Goal: to care for her mother again (pt is primary caregiver) Progress towards PT goals: Progressing toward goals    Frequency  7X/week    PT Plan Discharge plan needs to be updated    Co-evaluation             End of Session Equipment Utilized During Treatment: Oxygen;Gait belt Activity Tolerance: Patient limited by fatigue;Treatment limited secondary to medical complications (Comment) (decr SaO2) Patient left: in bed;with call bell/phone within reach;with bed alarm set;with family/visitor present     Time: 7829-56211144-1217 PT Time Calculation (min): 33 min  Charges:  $Gait Training: 8-22 mins $Therapeutic Exercise: 8-22 mins                    G Codes:      Marthann Abshier 07/05/2014, 12:32 PM  Pager [phone]

## 2014-07-05 NOTE — Care Management (Signed)
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     Action/Plan:   [facility]. Social worker is aware.  [facility]   Anticipated DC Date:  07/06/2014   Anticipated DC Plan:  SKILLED NURSING FACILITY  In-house referral  Clinical Social Worker      DC Planning Services  CM consult      Choice offered to / List presented to:     DME arranged  NA        HH arranged  NA      Status of service:  Completed, signed off Medicare Important Message given?   (If response is "NO", the following Medicare IM given date fields will be blank) Date Medicare IM given:   Medicare IM given by:   Date Additional Medicare IM given:   Additional Medicare IM given by:    Discharge Disposition:  SKILLED NURSING FACILITY  Per UR Regulation:  Reviewed for med. necessity/level of care/duration of stay  If discussed at Long Length of Stay Meetings, dates discussed:    Comments:

## 2014-07-05 NOTE — Progress Notes (Signed)
Orthopedic Tech Progress Note Patient Details:  Yvonne Todd Jawad March 18, 1951 [id number] On cpm at 8:40 pm Patient ID: Yvonne Todd Cena, female   DOB: March 18, 1951, 63 y.o.   MRN: [id number]   Jennye MoccasinHughes, Neylan Koroma Craig 07/05/2014, 8:40 PM

## 2014-07-06 ENCOUNTER — Encounter (HOSPITAL_COMMUNITY): Payer: Self-pay | Admitting: Orthopedic Surgery

## 2014-07-06 DIAGNOSIS — J449 Chronic obstructive pulmonary disease, unspecified: Secondary | ICD-10-CM | POA: Diagnosis present

## 2014-07-06 LAB — GLUCOSE, CAPILLARY
GLUCOSE-CAPILLARY: 220 mg/dL — AB (ref 70–99)
GLUCOSE-CAPILLARY: 173 mg/dL — AB (ref 70–99)
Glucose-Capillary: 166 mg/dL — ABNORMAL HIGH (ref 70–99)

## 2014-07-06 LAB — CBC
HCT: 30 % — ABNORMAL LOW (ref 36.0–46.0)
HEMOGLOBIN: 9 g/dL — AB (ref 12.0–15.0)
MCH: 25.7 pg — AB (ref 26.0–34.0)
MCHC: 30 g/dL (ref 30.0–36.0)
MCV: 85.7 fL (ref 78.0–100.0)
Platelets: 257 10*3/uL (ref 150–400)
RBC: 3.5 MIL/uL — ABNORMAL LOW (ref 3.87–5.11)
RDW: 15.8 % — ABNORMAL HIGH (ref 11.5–15.5)
WBC: 7.4 10*3/uL (ref 4.0–10.5)

## 2014-07-06 LAB — BASIC METABOLIC PANEL
Anion gap: 10 (ref 5–15)
BUN: 12 mg/dL (ref 6–23)
CALCIUM: 8.9 mg/dL (ref 8.4–10.5)
CO2: 33 meq/L — AB (ref 19–32)
CREATININE: 0.52 mg/dL (ref 0.50–1.10)
Chloride: 101 mEq/L (ref 96–112)
GFR calc Af Amer: >90 mL/min (ref >90)
GFR calc non Af Amer: >90 mL/min (ref >90)
GLUCOSE: 191 mg/dL — AB (ref 70–99)
Potassium: 5.4 mEq/L — ABNORMAL HIGH (ref 3.7–5.3)
Sodium: 144 mEq/L (ref 137–147)

## 2014-07-06 MED ORDER — PREDNISONE (PAK) 10 MG PO TABS
ORAL_TABLET | Freq: Every day | ORAL | Status: DC
Start: 2014-07-06 — End: 2015-02-21

## 2014-07-06 MED ORDER — RIVAROXABAN 10 MG PO TABS: 10.0000 mg | ORAL_TABLET | Freq: Every day | ORAL | Status: DC

## 2014-07-06 MED ORDER — OXYCODONE HCL ER 20 MG PO T12A: 20.0000 mg | EXTENDED_RELEASE_TABLET | Freq: Two times a day (BID) | ORAL | Status: DC

## 2014-07-06 MED ORDER — ACETAMINOPHEN 325 MG PO TABS: 650.0000 mg | ORAL_TABLET | Freq: Four times a day (QID) | ORAL | Status: DC | PRN

## 2014-07-06 MED ORDER — POLYETHYLENE GLYCOL 3350 17 G PO PACK: 17.0000 g | PACK | Freq: Two times a day (BID) | ORAL | Status: DC

## 2014-07-06 MED ORDER — MOMETASONE FURO-FORMOTEROL FUM 100-5 MCG/ACT IN AERO: 2.0000 | INHALATION_SPRAY | Freq: Two times a day (BID) | RESPIRATORY_TRACT | Status: DC

## 2014-07-06 MED ORDER — DSS 100 MG PO CAPS: 100.0000 mg | ORAL_CAPSULE | Freq: Two times a day (BID) | ORAL | Status: DC

## 2014-07-06 MED ORDER — CELECOXIB 200 MG PO CAPS: 200.0000 mg | ORAL_CAPSULE | Freq: Two times a day (BID) | ORAL | Status: DC

## 2014-07-06 MED ORDER — ALBUTEROL SULFATE HFA 108 (90 BASE) MCG/ACT IN AERS: 2.0000 | INHALATION_SPRAY | Freq: Four times a day (QID) | RESPIRATORY_TRACT | Status: DC

## 2014-07-06 MED ORDER — OXYCODONE HCL 5 MG PO TABS: 5.0000 mg | ORAL_TABLET | ORAL | Status: DC | PRN

## 2014-07-06 NOTE — Discharge Summary (Signed)
Patient ID: Yvonne Todd MRN: [id number] DOB/AGE: [birth year] 63 y.o.  Admit date: 07/03/2014 Discharge date: 07/06/2014  Admission Diagnoses:  Principal Problem:   Primary localized osteoarthritis of right knee Active Problems:   Tobacco use disorder   Left carotid bruit   Essential hypertension   Traumatic arthritis of right knee   DJD (degenerative joint disease) of knee   Low O2 saturation   COPD (chronic obstructive pulmonary disease) with chronic bronchitis   Discharge Diagnoses:  Same  Past Medical History  Diagnosis Date  . Allergy     Iodine, Penicillin, Tetracycline.  Patient reports reaction of hives, rash, no sob, no wheezing   . Arthritis     .Right knee, right hip  . Depression     Past year depression not sleeping after fiancee deceased 2014  . Hyperlipidemia     Dx 03/2014  . Hypertension     Dx 03/2014  . Primary localized osteoarthritis of right knee   . Pneumonia     hx of recently po antibiotics    Surgeries: Procedure(s): RIGHT TOTAL KNEE ARTHROPLASTY on 07/03/2014   Consultants:    Discharged Condition: Improved  Hospital Course: Yvonne Todd is an 63 y.o. female who was admitted 07/03/2014 for operative treatment ofPrimary localized osteoarthritis of right knee. Patient has severe unremitting pain that affects sleep, daily activities, and work/hobbies. After pre-op clearance the patient was taken to the operating room on 07/03/2014 and underwent  Procedure(s): RIGHT TOTAL KNEE ARTHROPLASTY.    Patient was given perioperative antibiotics:      Anti-infectives    Start     Dose/Rate Route Frequency Ordered Stop   07/03/14 1630  clindamycin (CLEOCIN) IVPB 600 mg     600 mg100 mL/hr over 30 Minutes Intravenous Every 6 hours 07/03/14 1425 07/03/14 2200   07/03/14 0600  clindamycin (CLEOCIN) IVPB 900 mg     900 mg100 mL/hr over 30 Minutes Intravenous On call to O.R. 07/02/14 1322 07/03/14 1117       Patient was given sequential compression  devices, early ambulation, and chemoprophylaxis to prevent DVT.  She has low baseline saturation with chronic COPD.  When O2 is completely withdrawn then her 02 sats are in the low 80's and high  70's.  We are doing aggressive incentive spirometry, flutter valve, decadron 10 mg IV q 8 hrs, scheduled albuterol nebulizer and have started Dulera.  We feel this is COPD has she does not have an elevated white count or a fever.  Her baseline 02 sat on room air preop was 94.  She did have a surgery cancelled one month prior because her baseline 02 sat was 86 on room air.  We are doing a sterapred taper in the SNF since she has been on steroids in the hospital.  She will continue the [facility] inhaler BID for at least the next 30 days.  She will need scheduled use of her albuterol as well.  I have  Ordered 1 liter of nasal canula O2 to use until she can maintain her sats in the high 80's or low 90's.  She has done very well with regards to her total knee rehabilitation and continues to be very motivated with this.  Patient benefited maximally from hospital stay and there were no other complications.    Recent vital signs:  Patient Vitals for the past 24 hrs:  BP Temp Temp src Pulse Resp SpO2  07/06/14 0728 - - - 64 18 95 %  07/06/14 0536 Marland Kitchen(!)  138/48 mmHg 98 F (36.7 C) Oral 70 - 93 %  07/06/14 0400 - - - - 18 97 %  07/06/14 0000 - - - - 18 98 %  07/05/14 2106 - - - - - 96 %  07/05/14 2030 (!) 154/54 mmHg 98.1 F (36.7 C) Oral 68 - 94 %  07/05/14 2000 - - - - 18 96 %  07/05/14 1809 - - - - - 94 %  07/05/14 1532 - - - - - (!) 85 %  07/05/14 1300 (!) 122/43 mmHg 98.5 F (36.9 C) - 62 18 96 %  07/05/14 0930 - - - - - 97 %     Recent laboratory studies:   Recent Labs  07/05/14 0532 07/06/14 0418  WBC 7.6 7.4  HGB 9.4* 9.0*  HCT 30.4* 30.0*  PLT 263 257  NA 144 144  K 4.4 5.4*  CL 100 101  CO2 36* 33*  BUN 10 12  CREATININE 0.46* 0.52  GLUCOSE 179* 191*  CALCIUM 8.7 8.9     Discharge  Medications:     Medication List    STOP taking these medications        aspirin EC 81 MG tablet     HYDROcodone-acetaminophen 5-325 MG per tablet  Commonly known as:  NORCO/VICODIN     NON FORMULARY      TAKE these medications        acetaminophen 325 MG tablet  Commonly known as:  TYLENOL  Take 2 tablets (650 mg total) by mouth every 6 (six) hours as needed for mild pain (or Fever >/= 101).     albuterol 108 (90 BASE) MCG/ACT inhaler  Commonly known as:  PROVENTIL HFA;VENTOLIN HFA  Inhale 2 puffs into the lungs every 6 (six) hours.     amLODipine 5 MG tablet  Commonly known as:  NORVASC  Take 1 tablet (5 mg total) by mouth daily.     celecoxib 200 MG capsule  Commonly known as:  CELEBREX  Take 1 capsule (200 mg total) by mouth every 12 (twelve) hours.     DSS 100 MG Caps  Take 100 mg by mouth 2 (two) times daily.     fluticasone 50 MCG/ACT nasal spray  Commonly known as:  FLONASE  Place 2 sprays into both nostrils 2 (two) times daily.     mometasone-formoterol 100-5 MCG/ACT Aero  Commonly known as:  DULERA  Inhale 2 puffs into the lungs 2 (two) times daily.     nitroGLYCERIN 0.4 MG SL tablet  Commonly known as:  NITROSTAT  Place 1 tablet (0.4 mg total) under the tongue every 5 (five) minutes as needed for chest pain.     oxyCODONE 5 MG immediate release tablet  Commonly known as:  Oxy IR/ROXICODONE  Take 1-2 tablets (5-10 mg total) by mouth every 4 (four) hours as needed for breakthrough pain.     OxyCODONE 20 mg T12a 12 hr tablet  Commonly known as:  OXYCONTIN  Take 1 tablet (20 mg total) by mouth every 12 (twelve) hours.     polyethylene glycol packet  Commonly known as:  MIRALAX / GLYCOLAX  Take 17 g by mouth 2 (two) times daily.     predniSONE 10 MG tablet  Commonly known as:  STERAPRED UNI-PAK  - Take by mouth daily. 6 pills days 1, 2, and 3,  - 4 pills days 4,5, and 6,  - 2 pills days 7, 8, and 9,  - 1 pill days 10,  11, and 12      rivaroxaban 10 MG Tabs tablet  Commonly known as:  XARELTO  Take 1 tablet (10 mg total) by mouth daily with breakfast.     simvastatin 20 MG tablet  Commonly known as:  ZOCOR  Take 1 tablet (20 mg total) by mouth at bedtime.        Diagnostic Studies: Dg Chest 2 View  06/23/2014   CLINICAL DATA:  63 year old female with right total knee arthroplasty. History of hypertension pneumonia and previous smoking. Forty pack-year history.  EXAM: CHEST - 2 VIEW  COMPARISON:  06/01/2014  FINDINGS: Cardiomediastinal silhouette projects within normal limits in size and contour. No confluent airspace disease, pneumothorax, or pleural effusion.  No displaced fracture.  Unremarkable appearance of the upper abdomen.  Atherosclerosis  IMPRESSION: No radiographic evidence of acute cardiopulmonary disease.  Atherosclerosis  Signed,  Yvone Neu. Loreta Ave, DO  [facility]  [facility]   Electronically Signed   By: Gilmer Mor D.O.   On: 06/23/2014 13:37    Disposition: 01-Home or Self Care  Discharge Instructions    CPM    Complete by:  As directed   Continuous passive motion machine (CPM):      Use the CPM from 0 to 90 for 6 hours per day.       You may break it up into 2 or 3 sessions per day.      Use CPM for 2 weeks or until you are told to stop.     Call MD / Call 911    Complete by:  As directed   If you experience chest pain or shortness of breath, CALL 911 and be transported to the hospital emergency room.  If you develope a fever above 101 F, pus (white drainage) or increased drainage or redness at the wound, or calf pain, call your surgeon's office.     Change dressing    Complete by:  As directed   Keep bandage over wound.  Wash whole leg including over the bandage every day with soap and water.     Constipation Prevention    Complete by:  As directed   Drink plenty of fluids.  Prune juice may be helpful.  You may use a stool softener, such as Colace  (over the counter) 100 mg twice a day.  Use MiraLax (over the counter) for constipation as needed.     Diet - low sodium heart healthy    Complete by:  As directed      Discharge instructions    Complete by:  As directed   Total Knee Replacement Care After Refer to this sheet in the next few weeks. These discharge instructions provide you with general information on caring for yourself after you leave the hospital. Your caregiver may also give you specific instructions. Your treatment has been planned according to the most current medical practices available, but unavoidable complications sometimes occur. If you have any problems or questions after discharge, please call your caregiver. Regaining a near full range of motion of your knee within the first 3 to 6 weeks after surgery is critical. HOME CARE INSTRUCTIONS  You may resume a normal diet and activities as directed.  Perform exercises as directed.  Place gray foam block, curve side up under heel at all times except when in CPM or when walking.  DO NOT modify, tear, cut, or change in any way the gray foam block. You will receive physical therapy  daily  Take showers instead of baths until informed otherwise.  You may shower and any time.  Please wash whole leg including wound with soap and water  Keep bandage over wound.  Wash whole leg including over the bandage every day with soap and water. It is OK to take over-the-counter tylenol in addition to the oxycodone for pain, discomfort, or fever. Oxycodone is VERY constipating.  Please take stool softener twice a day and laxatives daily until bowels are regular Eat a well-balanced diet.  Avoid lifting or driving until you are instructed otherwise.  Make an appointment to see your caregiver for stitches (suture) or staple removal as directed.  If you have been sent home with a continuous passive motion machine (CPM machine), 0-90 degrees 6 hrs a day   2 hrs a shift SEEK MEDICAL CARE IF: You  have swelling of your calf or leg.  You develop shortness of breath or chest pain.  You have redness, swelling, or increasing pain in the wound.  There is pus or any unusual drainage coming from the surgical site.  You notice a bad smell coming from the surgical site or dressing.  The surgical site breaks open after sutures or staples have been removed.  There is persistent bleeding from the suture or staple line.  You are getting worse or are not improving.  You have any other questions or concerns.  SEEK IMMEDIATE MEDICAL CARE IF:  You have a fever.  You develop a rash.  You have difficulty breathing.  You develop any reaction or side effects to medicines given.  Your knee motion is decreasing rather than improving.  MAKE SURE YOU:  Understand these instructions.  Will watch your condition.  Will get help right away if you are not doing well or get worse.     Do not put a pillow under the knee. Place it under the heel.    Complete by:  As directed   Place yellow foam block, yellow side up under heel at all times except when in CPM or when walking.  DO NOT modify, tear, cut, or change in any way the yellow foam block.     For home use only DME oxygen    Complete by:  As directed   Mode or (Route):  Nasal cannula  Liters per Minute:  1  Oxygen delivery system:  Gas     Increase activity slowly as tolerated    Complete by:  As directed      TED hose    Complete by:  As directed   Use stockings (TED hose) for 2 weeks on both leg(s).  You may remove them at night for sleeping.           Follow-up Information    Follow up with Nilda SimmerWAINER,ROBERT A, MD On 07/17/2014.   Specialty:  Orthopedic Surgery   Why:  appt time 3 pm with Julien GirtKirstin Gumecindo Hopkin PA-C   Contact information:   [address] [place] [address] [phone]        Signed: Pascal LuxSHEPPERSON,Arch Methot J 07/06/2014, 7:44 AM

## 2014-07-06 NOTE — Plan of Care (Signed)
Problem: Discharge Progression Outcomes Goal: Anticoagulant follow-up in place Outcome: Adequate for Discharge Goal: Hemodynamically stable Outcome: Adequate for Discharge Goal: Complications resolved/controlled Outcome: Adequate for Discharge Goal: Activity appropriate for discharge plan Outcome: Completed/Met Date Met:  07/06/14 Goal: Ambulates safely using assistive device Outcome: Adequate for Discharge Goal: Follows weight - bearing limitations Outcome: Completed/Met Date Met:  07/06/14 Goal: Discharge plan in place and appropriate Outcome: Completed/Met Date Met:  07/06/14 Goal: Negotiates stairs Outcome: Not Applicable Date Met:  03/21/81 Goal: Demonstrates ADLs as appropriate Outcome: Adequate for Discharge Goal: Incision without S/S infection Outcome: Completed/Met Date Met:  07/06/14 Goal: Other Discharge Outcomes/Goals Outcome: Completed/Met Date Met:  07/06/14

## 2014-07-06 NOTE — Progress Notes (Signed)
Orthopedic Tech Progress Note Patient Details:  Yvonne Todd Hunton 1950/12/21 [id number]  Patient ID: Yvonne Todd Titus, female   DOB: 1950/12/21, 63 y.o.   MRN: [id number] Placed pt's rle in cpm @0 -70 degrees @1430   Tryphena Perkovich 07/06/2014, 2:26 PM

## 2014-07-06 NOTE — Progress Notes (Signed)
Physical Therapy Treatment Patient Details Name: Yvonne Todd MRN: [id number] DOB: [birth year] Today's Date: 07/06/2014    History of Present Illness Patient is a 63 yo female admitted 07/03/14 now s/p Rt TKA.  PMH:  HTN, HLD, hypoxia 06/2014    PT Comments    Pt is progressing well with her mobility and exercises, however, she continues to show safety concerns with her impulsivity and decreased balance.  PT continues to recommend CIR level therapies at discharge.   Follow Up Recommendations  SNF     Equipment Recommendations  Rolling walker with 5" wheels;3in1 (PT)    Recommendations for Other Services   NA     Precautions / Restrictions Precautions Precautions: Knee;Fall Required Braces or Orthoses:  (d/c) Restrictions RLE Weight Bearing: Weight bearing as tolerated    Mobility  Bed Mobility Overal bed mobility: Needs Assistance Bed Mobility: Supine to Sit;Sit to Supine     Supine to sit: Modified independent (Device/Increase time) Sit to supine: Modified independent (Device/Increase time)   General bed mobility comments: pt is able when given extra time and using her hands to progress right leg off of bed to get herself into and out of bed without external assist.   Transfers Overall transfer level: Needs assistance Equipment used: Rolling walker (2 wheeled) Transfers: Sit to/from Stand Sit to Stand: Supervision         General transfer comment: supervision for safety and verbal cues for safety as pt wants to stand up before RW is in front of her.   Ambulation/Gait Ambulation/Gait assistance: Min guard Ambulation Distance (Feet): 90 Feet Assistive device: Rolling walker (2 wheeled) Gait Pattern/deviations: Step-to pattern;Antalgic Gait velocity: decreased Gait velocity interpretation: Below normal speed for age/gender General Gait Details: Pt with 3/4 DOE on 2 L O2 Farrell during gait. Unable to get pulse ox reading on her finger.  Pt continues to move  impulsively with the walker, letting go of it without warning to reach for things in her room/in the bathroom.  Verbal cues for safety and min guard assist for balance and safety during gait.           Balance Overall balance assessment: Needs assistance Sitting-balance support: Feet supported;No upper extremity supported Sitting balance-Leahy Scale: Good     Standing balance support: Bilateral upper extremity supported;No upper extremity supported;Single extremity supported Standing balance-Leahy Scale: Fair                      Cognition Arousal/Alertness: Awake/alert Behavior During Therapy: Impulsive Overall Cognitive Status: Impaired/Different from baseline Area of Impairment: Memory     Memory: Decreased short-term memory (does not remember the oxygen)              Exercises Total Joint Exercises Ankle Circles/Pumps: AROM;Both;10 reps Quad Sets: AROM;Both;10 reps Towel Squeeze: AROM;Both;10 reps;Supine Short Arc Quad: AROM;Right;10 reps;Supine Heel Slides: AAROM;Right;10 reps Hip ABduction/ADduction: AROM;Right;10 reps;Supine Straight Leg Raises: AROM;Right;10 reps;Supine        Pertinent Vitals/Pain Pain Assessment: Faces Faces Pain Scale: Hurts little more Pain Location: right knee Pain Descriptors / Indicators: Aching Pain Intervention(s): Limited activity within patient's tolerance;Monitored during session;Premedicated before session;Repositioned           PT Goals (current goals can now be found in the care plan section) Acute Rehab PT Goals Patient Stated Goal: to get well enough that she is not as much of a burden on her sister who will be caring for her and her mother Progress towards PT goals:  Progressing toward goals    Frequency  7X/week    PT Plan Discharge plan needs to be updated       End of Session Equipment Utilized During Treatment: Oxygen Activity Tolerance: Patient limited by fatigue;Patient limited by pain Patient  left: in bed;in CPM;with call bell/phone within reach     Time: 1045-1105 PT Time Calculation (min): 20 min  Charges:  $Gait Training: 8-22 mins                      Patsy Zaragoza B. Tayvion Lauder, PT, DPT [phone]   07/06/2014, 2:37 PM

## 2014-07-06 NOTE — Clinical Social Work Note (Addendum)
4:21pm [facility]. Pt, pt's sister, RN, and covering PA updated. Pt to be discharged on 07/06/2014.  3:55pm [facility]  [facility] to CSW for pt to complete at bedside. Pt and pt's sister, Yvonne Todd, completed admission paperwork. [facility]. Pt's sister, Yvonne Todd, to transport pt once SNF authorization received.  10:37am [facility]. Pt updated at bedside regarding discharge.  [facility]: [phone] Transportation: Pt's sister, Yvonne Todd, to transport pt.  Marcelline Deistmily Siri Buege, LCSWA [phone]) Licensed Clinical Social Worker Orthopedics [phone]([id number]) and Surgical [phone]([id number])

## 2014-07-07 ENCOUNTER — Non-Acute Institutional Stay (SKILLED_NURSING_FACILITY): Payer: BC Managed Care – PPO | Admitting: Adult Health

## 2014-07-07 ENCOUNTER — Other Ambulatory Visit: Payer: Self-pay | Admitting: *Deleted

## 2014-07-07 ENCOUNTER — Encounter: Payer: Self-pay | Admitting: Adult Health

## 2014-07-07 DIAGNOSIS — I10 Essential (primary) hypertension: Secondary | ICD-10-CM

## 2014-07-07 DIAGNOSIS — E785 Hyperlipidemia, unspecified: Secondary | ICD-10-CM

## 2014-07-07 DIAGNOSIS — J449 Chronic obstructive pulmonary disease, unspecified: Secondary | ICD-10-CM

## 2014-07-07 DIAGNOSIS — K5900 Constipation, unspecified: Secondary | ICD-10-CM

## 2014-07-07 DIAGNOSIS — Z96651 Presence of right artificial knee joint: Secondary | ICD-10-CM

## 2014-07-07 DIAGNOSIS — M1711 Unilateral primary osteoarthritis, right knee: Secondary | ICD-10-CM

## 2014-07-07 MED ORDER — OXYCODONE HCL 5 MG PO TABS: ORAL_TABLET | ORAL | Status: DC

## 2014-07-07 MED ORDER — OXYCODONE HCL ER 20 MG PO T12A: EXTENDED_RELEASE_TABLET | ORAL | Status: DC

## 2014-07-07 NOTE — Telephone Encounter (Signed)
[facility] 
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
## 2014-07-11 DIAGNOSIS — K5900 Constipation, unspecified: Secondary | ICD-10-CM | POA: Insufficient documentation

## 2014-07-11 DIAGNOSIS — Z96651 Presence of right artificial knee joint: Secondary | ICD-10-CM | POA: Insufficient documentation

## 2014-07-13 ENCOUNTER — Non-Acute Institutional Stay (SKILLED_NURSING_FACILITY): Payer: BC Managed Care – PPO | Admitting: Internal Medicine

## 2014-07-13 DIAGNOSIS — K5900 Constipation, unspecified: Secondary | ICD-10-CM

## 2014-07-13 DIAGNOSIS — M1711 Unilateral primary osteoarthritis, right knee: Secondary | ICD-10-CM

## 2014-07-13 DIAGNOSIS — I10 Essential (primary) hypertension: Secondary | ICD-10-CM

## 2014-07-13 DIAGNOSIS — J441 Chronic obstructive pulmonary disease with (acute) exacerbation: Secondary | ICD-10-CM

## 2014-07-13 DIAGNOSIS — R300 Dysuria: Secondary | ICD-10-CM

## 2014-07-14 ENCOUNTER — Non-Acute Institutional Stay (SKILLED_NURSING_FACILITY): Payer: BC Managed Care – PPO | Admitting: Adult Health

## 2014-07-14 DIAGNOSIS — I10 Essential (primary) hypertension: Secondary | ICD-10-CM

## 2014-07-14 DIAGNOSIS — M1711 Unilateral primary osteoarthritis, right knee: Secondary | ICD-10-CM

## 2014-07-14 DIAGNOSIS — Z96651 Presence of right artificial knee joint: Secondary | ICD-10-CM

## 2014-07-18 ENCOUNTER — Ambulatory Visit: Payer: BC Managed Care – PPO | Admitting: Internal Medicine

## 2014-07-21 ENCOUNTER — Encounter: Payer: Self-pay | Admitting: Adult Health
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
## 2014-07-26 ENCOUNTER — Ambulatory Visit: Payer: Self-pay | Admitting: Internal Medicine
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
## 2014-08-27 ENCOUNTER — Other Ambulatory Visit: Payer: Self-pay | Admitting: Nurse Practitioner

## 2014-09-11 ENCOUNTER — Other Ambulatory Visit: Payer: Self-pay | Admitting: Internal Medicine

## 2014-09-12 ENCOUNTER — Other Ambulatory Visit: Payer: Self-pay | Admitting: Geriatric Medicine

## 2014-09-12 DIAGNOSIS — I10 Essential (primary) hypertension: Secondary | ICD-10-CM

## 2014-09-12 MED ORDER — AMLODIPINE BESYLATE 5 MG PO TABS: 5.0000 mg | ORAL_TABLET | Freq: Every day | ORAL | Status: DC

## 2014-09-14 ENCOUNTER — Other Ambulatory Visit: Payer: Self-pay | Admitting: Geriatric Medicine

## 2014-10-20 ENCOUNTER — Other Ambulatory Visit: Payer: Self-pay

## 2014-10-20 DIAGNOSIS — Z1231 Encounter for screening mammogram for malignant neoplasm of breast: Secondary | ICD-10-CM

## 2015-01-01 ENCOUNTER — Ambulatory Visit: Payer: Self-pay

## 2015-02-05 ENCOUNTER — Ambulatory Visit
Admission: RE | Admit: 2015-02-05 | Discharge: 2015-02-05 | Disposition: A | Discharge status: Home / Self Care | Payer: Medicare Other | Source: Ambulatory Visit

## 2015-02-05 DIAGNOSIS — Z1231 Encounter for screening mammogram for malignant neoplasm of breast: Secondary | ICD-10-CM

## 2015-02-21 ENCOUNTER — Ambulatory Visit (INDEPENDENT_AMBULATORY_CARE_PROVIDER_SITE_OTHER): Payer: Medicare Other | Admitting: Internal Medicine

## 2015-02-21 ENCOUNTER — Encounter: Payer: Self-pay | Admitting: Internal Medicine

## 2015-02-21 ENCOUNTER — Other Ambulatory Visit (INDEPENDENT_AMBULATORY_CARE_PROVIDER_SITE_OTHER): Payer: Medicare Other

## 2015-02-21 VITALS — BP 158/92 | HR 88 | Temp 98.3°F | Resp 18 | Ht 62.0 in | Wt 189.4 lb

## 2015-02-21 DIAGNOSIS — I10 Essential (primary) hypertension: Secondary | ICD-10-CM

## 2015-02-21 DIAGNOSIS — F17200 Nicotine dependence, unspecified, uncomplicated: Secondary | ICD-10-CM

## 2015-02-21 DIAGNOSIS — R7301 Impaired fasting glucose: Secondary | ICD-10-CM

## 2015-02-21 DIAGNOSIS — E785 Hyperlipidemia, unspecified: Secondary | ICD-10-CM

## 2015-02-21 DIAGNOSIS — Z0000 Encounter for general adult medical examination without abnormal findings: Secondary | ICD-10-CM | POA: Diagnosis not present

## 2015-02-21 LAB — CBC
HEMATOCRIT: 37.1 % (ref 36.0–46.0)
Hemoglobin: 11.9 g/dL — ABNORMAL LOW (ref 12.0–15.0)
MCHC: 32.1 g/dL (ref 30.0–36.0)
MCV: 78.3 fl (ref 78.0–100.0)
PLATELETS: 316 10*3/uL (ref 150.0–400.0)
RBC: 4.74 Mil/uL (ref 3.87–5.11)
RDW: 20.3 % — ABNORMAL HIGH (ref 11.5–15.5)
WBC: 5.9 10*3/uL (ref 4.0–10.5)

## 2015-02-21 LAB — COMPREHENSIVE METABOLIC PANEL
ALBUMIN: 4.3 g/dL (ref 3.5–5.2)
ALT: 12 U/L (ref 0–35)
AST: 16 U/L (ref 0–37)
Alkaline Phosphatase: 72 U/L (ref 39–117)
BUN: 14 mg/dL (ref 6–23)
CALCIUM: 9.7 mg/dL (ref 8.4–10.5)
CHLORIDE: 103 meq/L (ref 96–112)
CO2: 32 meq/L (ref 19–32)
Creatinine, Ser: 0.69 mg/dL (ref 0.40–1.20)
GFR: 90.75 mL/min (ref >60.00)
GLUCOSE: 131 mg/dL — AB (ref 70–99)
Potassium: 5.1 mEq/L (ref 3.5–5.1)
Sodium: 141 mEq/L (ref 135–145)
Total Bilirubin: 0.3 mg/dL (ref 0.2–1.2)
Total Protein: 7.1 g/dL (ref 6.0–8.3)

## 2015-02-21 LAB — LIPID PANEL
Cholesterol: 300 mg/dL — ABNORMAL HIGH (ref 0–200)
HDL: 56.3 mg/dL (ref >39.00)
LDL CALC: 214 mg/dL — AB (ref 0–99)
NONHDL: 243.7
Total CHOL/HDL Ratio: 5
Triglycerides: 147 mg/dL (ref 0.0–149.0)
VLDL: 29.4 mg/dL (ref 0.0–40.0)

## 2015-02-21 LAB — HEMOGLOBIN A1C: HEMOGLOBIN A1C: 6.7 % — AB (ref 4.6–6.5)

## 2015-02-21 MED ORDER — AMLODIPINE BESYLATE 10 MG PO TABS: 10.0000 mg | ORAL_TABLET | Freq: Every day | ORAL | Status: DC

## 2015-02-21 MED ORDER — SIMVASTATIN 20 MG PO TABS: 20.0000 mg | ORAL_TABLET | Freq: Every day | ORAL | Status: DC

## 2015-02-21 MED ORDER — FLUTICASONE PROPIONATE 50 MCG/ACT NA SUSP: 2.0000 | Freq: Two times a day (BID) | NASAL | Status: DC

## 2015-02-21 NOTE — Assessment & Plan Note (Signed)
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 Last HgA1c 6.3 and rechecking today. Foot exam done. Talked to her about the need to lose weight to help decrease risk of progression.

## 2015-02-21 NOTE — Progress Notes (Signed)
   Subjective:    Patient ID: Yvonne Todd, female    DOB: 02-11-1951, 64 y.o.   MRN: [id number]  HPI Here for medicare wellness, no new complaints. Please see A/P for status and treatment of chronic medical problems.   Diet: poor Physical activity: sedentary Depression/mood screen: negative Hearing: intact to whispered voice Visual acuity: grossly normal, performs annual eye exam  ADLs: capable Fall risk: none Home safety: good Cognitive evaluation: intact to orientation, naming, recall and repetition EOL planning: adv directives discussed not in place  I have personally reviewed and have noted 1. The patient's medical and social history - reviewed today no changes 2. Their use of alcohol, tobacco or illicit drugs 3. Their current medications and supplements 4. The patient's functional ability including ADL's, fall risks, home safety risks and hearing or visual impairment. 5. Diet and physical activities 6. Evidence for depression or mood disorders 7. Care team reviewed and updated (available in snapshot)  Review of Systems  Constitutional: Negative for fever, chills, activity change and appetite change.  HENT: Negative.   Eyes: Negative.   Respiratory: Negative for cough, chest tightness and shortness of breath.   Cardiovascular: Negative for chest pain, palpitations and leg swelling.  Gastrointestinal: Negative for nausea, vomiting, abdominal pain, diarrhea, constipation and abdominal distention.  Musculoskeletal: Negative for back pain and neck pain.  Skin: Negative.   Neurological: Negative for dizziness, weakness, light-headedness and headaches.  Psychiatric/Behavioral: Negative.       Objective:   Physical Exam  Constitutional: She is oriented to person, place, and time. She appears well-developed and well-nourished. No distress.  HENT:  Head: Normocephalic and atraumatic.  Eyes: EOM are normal.  Neck: Normal range of motion.  Cardiovascular: Normal rate and  regular rhythm.   Pulmonary/Chest: Effort normal. No respiratory distress. She has no wheezes.  Abdominal: Soft. Bowel sounds are normal.  Musculoskeletal: She exhibits no edema.  Neurological: She is alert and oriented to person, place, and time.  Skin: Skin is warm and dry.  See foot exam  Psychiatric: She has a normal mood and affect.  Vitals reviewed.  Filed Vitals:   02/21/15 0819  BP: 158/92  Pulse: 88  Temp: 98.3 F (36.8 C)  TempSrc: Oral  Resp: 18  Height: 5\' 2"  (1.575 m)  Weight: 189 lb 6.4 oz (85.911 kg)  SpO2: 90%      Assessment & Plan:

## 2015-02-21 NOTE — Progress Notes (Signed)
Pre visit review using our clinic review tool, if applicable. No additional management support is needed unless otherwise documented below in the visit note. 

## 2015-02-21 NOTE — Assessment & Plan Note (Signed)
Declines immunizations today. Has never had colon cancer screening and will do cologuard. Negative depression screen. Talked to her about the need to exercise and work on diet. She is smoking and has no intentions of quitting although this was discussed with her today. Given 10 year screening recommendations.

## 2015-02-21 NOTE — Assessment & Plan Note (Signed)
Still smoking but less. No intention to quit at this time although I did counsel her on that need.

## 2015-02-21 NOTE — Assessment & Plan Note (Signed)
BP mildly elevated most visits and increasing amlodipine to 10 mg daily. Checking CMP today.

## 2015-02-21 NOTE — Assessment & Plan Note (Signed)
Checking lipid panel today. 

## 2015-02-21 NOTE — Patient Instructions (Signed)
We will have them send the colon cancer screening test to your house. If it comes back negative there is a 95% chance that there are no harmful polyps in the colon. If it comes back positive we would need to get the colonoscopy since there are likely some polyps and we would not know if they are some that could grow into cancer or not.   We will increase the strength of your blood pressure medicine so when you get the new bottle it will be slightly stronger. The blood pressure is likely a little up since you are up about 10 pounds since last time. If you are able to lose that weight we can likely go back down on the dose.   We will check the lab work today and call you back with the results.   Health Maintenance Adopting a healthy lifestyle and getting preventive care can go a long way to promote health and wellness. Talk with your health care provider about what schedule of regular examinations is right for you. This is a good chance for you to check in with your provider about disease prevention and staying healthy. In between checkups, there are plenty of things you can do on your own. Experts have done a lot of research about which lifestyle changes and preventive measures are most likely to keep you healthy. Ask your health care provider for more information. WEIGHT AND DIET  Eat a healthy diet  Be sure to include plenty of vegetables, fruits, low-fat dairy products, and lean protein.  Do not eat a lot of foods high in solid fats, added sugars, or salt.  Get regular exercise. This is one of the most important things you can do for your health.  Most adults should exercise for at least 150 minutes each week. The exercise should increase your heart rate and make you sweat (moderate-intensity exercise).  Most adults should also do strengthening exercises at least twice a week. This is in addition to the moderate-intensity exercise.  Maintain a healthy weight  Body mass index (BMI) is a  measurement that can be used to identify possible weight problems. It estimates body fat based on height and weight. Your health care provider can help determine your BMI and help you achieve or maintain a healthy weight.  For females 6 years of age and older:   A BMI below 18.5 is considered underweight.  A BMI of 18.5 to 24.9 is normal.  A BMI of 25 to 29.9 is considered overweight.  A BMI of 30 and above is considered obese.  Watch levels of cholesterol and blood lipids  You should start having your blood tested for lipids and cholesterol at 65 years of age, then have this test every 5 years.  You may need to have your cholesterol levels checked more often if:  Your lipid or cholesterol levels are high.  You are older than 65 years of age.  You are at high risk for heart disease.  CANCER SCREENING   Lung Cancer  Lung cancer screening is recommended for adults 82-61 years old who are at high risk for lung cancer because of a history of smoking.  A yearly low-dose CT scan of the lungs is recommended for people who:  Currently smoke.  Have quit within the past 15 years.  Have at least a 30-pack-year history of smoking. A pack year is smoking an average of one pack of cigarettes a day for 1 year.  Yearly screening should continue  until it has been 15 years since you quit.  Yearly screening should stop if you develop a health problem that would prevent you from having lung cancer treatment.  Breast Cancer  Practice breast self-awareness. This means understanding how your breasts normally appear and feel.  It also means doing regular breast self-exams. Let your health care provider know about any changes, no matter how small.  If you are in your 20s or 30s, you should have a clinical breast exam (CBE) by a health care provider every 1-3 years as part of a regular health exam.  If you are 1 or older, have a CBE every year. Also consider having a breast X-ray  (mammogram) every year.  If you have a family history of breast cancer, talk to your health care provider about genetic screening.  If you are at high risk for breast cancer, talk to your health care provider about having an MRI and a mammogram every year.  Breast cancer gene (BRCA) assessment is recommended for women who have family members with BRCA-related cancers. BRCA-related cancers include:  Breast.  Ovarian.  Tubal.  Peritoneal cancers.  Results of the assessment will determine the need for genetic counseling and BRCA1 and BRCA2 testing. Cervical Cancer Routine pelvic examinations to screen for cervical cancer are no longer recommended for nonpregnant women who are considered low risk for cancer of the pelvic organs (ovaries, uterus, and vagina) and who do not have symptoms. A pelvic examination may be necessary if you have symptoms including those associated with pelvic infections. Ask your health care provider if a screening pelvic exam is right for you.   The Pap test is the screening test for cervical cancer for women who are considered at risk.  If you had a hysterectomy for a problem that was not cancer or a condition that could lead to cancer, then you no longer need Pap tests.  If you are older than 65 years, and you have had normal Pap tests for the past 10 years, you no longer need to have Pap tests.  If you have had past treatment for cervical cancer or a condition that could lead to cancer, you need Pap tests and screening for cancer for at least 20 years after your treatment.  If you no longer get a Pap test, assess your risk factors if they change (such as having a new sexual partner). This can affect whether you should start being screened again.  Some women have medical problems that increase their chance of getting cervical cancer. If this is the case for you, your health care provider may recommend more frequent screening and Pap tests.  The human  papillomavirus (HPV) test is another test that may be used for cervical cancer screening. The HPV test looks for the virus that can cause cell changes in the cervix. The cells collected during the Pap test can be tested for HPV.  The HPV test can be used to screen women 44 years of age and older. Getting tested for HPV can extend the interval between normal Pap tests from three to five years.  An HPV test also should be used to screen women of any age who have unclear Pap test results.  After 65 years of age, women should have HPV testing as often as Pap tests.  Colorectal Cancer  This type of cancer can be detected and often prevented.  Routine colorectal cancer screening usually begins at 65 years of age and continues through 65 years of  age.  Your health care provider may recommend screening at an earlier age if you have risk factors for colon cancer.  Your health care provider may also recommend using home test kits to check for hidden blood in the stool.  A small camera at the end of a tube can be used to examine your colon directly (sigmoidoscopy or colonoscopy). This is done to check for the earliest forms of colorectal cancer.  Routine screening usually begins at age 34.  Direct examination of the colon should be repeated every 5-10 years through 65 years of age. However, you may need to be screened more often if early forms of precancerous polyps or small growths are found. Skin Cancer  Check your skin from head to toe regularly.  Tell your health care provider about any new moles or changes in moles, especially if there is a change in a mole's shape or color.  Also tell your health care provider if you have a mole that is larger than the size of a pencil eraser.  Always use sunscreen. Apply sunscreen liberally and repeatedly throughout the day.  Protect yourself by wearing long sleeves, pants, a wide-brimmed hat, and sunglasses whenever you are outside. HEART DISEASE,  DIABETES, AND HIGH BLOOD PRESSURE   Have your blood pressure checked at least every 1-2 years. High blood pressure causes heart disease and increases the risk of stroke.  If you are between 61 years and 7 years old, ask your health care provider if you should take aspirin to prevent strokes.  Have regular diabetes screenings. This involves taking a blood sample to check your fasting blood sugar level.  If you are at a normal weight and have a low risk for diabetes, have this test once every three years after 65 years of age.  If you are overweight and have a high risk for diabetes, consider being tested at a younger age or more often. PREVENTING INFECTION  Hepatitis B  If you have a higher risk for hepatitis B, you should be screened for this virus. You are considered at high risk for hepatitis B if:  You were born in a country where hepatitis B is common. Ask your health care provider which countries are considered high risk.  Your parents were born in a high-risk country, and you have not been immunized against hepatitis B (hepatitis B vaccine).  You have HIV or AIDS.  You use needles to inject street drugs.  You live with someone who has hepatitis B.  You have had sex with someone who has hepatitis B.  You get hemodialysis treatment.  You take certain medicines for conditions, including cancer, organ transplantation, and autoimmune conditions. Hepatitis C  Blood testing is recommended for:  Everyone born from [birth year] through 1966.  Anyone with known risk factors for hepatitis C. Sexually transmitted infections (STIs)  You should be screened for sexually transmitted infections (STIs) including gonorrhea and chlamydia if:  You are sexually active and are younger than 65 years of age.  You are older than 65 years of age and your health care provider tells you that you are at risk for this type of infection.  Your sexual activity has changed since you were last screened and  you are at an increased risk for chlamydia or gonorrhea. Ask your health care provider if you are at risk.  If you do not have HIV, but are at risk, it may be recommended that you take a prescription medicine daily to prevent HIV infection.  This is called pre-exposure prophylaxis (PrEP). You are considered at risk if:  You are sexually active and do not regularly use condoms or know the HIV status of your partner(s).  You take drugs by injection.  You are sexually active with a partner who has HIV. Talk with your health care provider about whether you are at high risk of being infected with HIV. If you choose to begin PrEP, you should first be tested for HIV. You should then be tested every 3 months for as long as you are taking PrEP.  PREGNANCY   If you are premenopausal and you may become pregnant, ask your health care provider about preconception counseling.  If you may become pregnant, take 400 to 800 micrograms (mcg) of folic acid every day.  If you want to prevent pregnancy, talk to your health care provider about birth control (contraception). OSTEOPOROSIS AND MENOPAUSE   Osteoporosis is a disease in which the bones lose minerals and strength with aging. This can result in serious bone fractures. Your risk for osteoporosis can be identified using a bone density scan.  If you are 17 years of age or older, or if you are at risk for osteoporosis and fractures, ask your health care provider if you should be screened.  Ask your health care provider whether you should take a calcium or vitamin D supplement to lower your risk for osteoporosis.  Menopause may have certain physical symptoms and risks.  Hormone replacement therapy may reduce some of these symptoms and risks. Talk to your health care provider about whether hormone replacement therapy is right for you.  HOME CARE INSTRUCTIONS   Schedule regular health, dental, and eye exams.  Stay current with your immunizations.   Do  not use any tobacco products including cigarettes, chewing tobacco, or electronic cigarettes.  If you are pregnant, do not drink alcohol.  If you are breastfeeding, limit how much and how often you drink alcohol.  Limit alcohol intake to no more than 1 drink per day for nonpregnant women. One drink equals 12 ounces of beer, 5 ounces of wine, or 1 ounces of hard liquor.  Do not use street drugs.  Do not share needles.  Ask your health care provider for help if you need support or information about quitting drugs.  Tell your health care provider if you often feel depressed.  Tell your health care provider if you have ever been abused or do not feel safe at home. Document Released: 03/03/2011 Document Revised: 01/02/2014 Document Reviewed: 07/20/2013 [facility] Patient Information 2015 [place], [place]. This information is not intended to replace advice given to you by your health care provider. Make sure you discuss any questions you have with your health care provider.

## 2015-04-20 ENCOUNTER — Other Ambulatory Visit: Payer: Self-pay | Admitting: Geriatric Medicine

## 2015-04-20 MED ORDER — SIMVASTATIN 20 MG PO TABS: 20.0000 mg | ORAL_TABLET | Freq: Every day | ORAL | Status: DC

## 2015-04-20 MED ORDER — FLUTICASONE PROPIONATE 50 MCG/ACT NA SUSP: 2.0000 | Freq: Two times a day (BID) | NASAL | Status: DC

## 2015-06-18 ENCOUNTER — Emergency Department (HOSPITAL_COMMUNITY)
Admission: EM | Admit: 2015-06-18 | Discharge: 2015-06-18 | Payer: Medicare Other | Source: Home / Self Care | Attending: Family Medicine | Admitting: Family Medicine

## 2015-06-18 ENCOUNTER — Emergency Department (HOSPITAL_COMMUNITY): Payer: Medicare Other

## 2015-06-18 ENCOUNTER — Encounter (HOSPITAL_COMMUNITY): Payer: Self-pay

## 2015-06-18 ENCOUNTER — Inpatient Hospital Stay (HOSPITAL_COMMUNITY)
Admission: EM | Admit: 2015-06-18 | Discharge: 2015-06-23 | DRG: 190 | Disposition: A | Discharge status: Home Health | Payer: Medicare Other | Source: Ambulatory Visit | Attending: Internal Medicine | Admitting: Internal Medicine

## 2015-06-18 DIAGNOSIS — E785 Hyperlipidemia, unspecified: Secondary | ICD-10-CM | POA: Diagnosis not present

## 2015-06-18 DIAGNOSIS — E118 Type 2 diabetes mellitus with unspecified complications: Secondary | ICD-10-CM | POA: Diagnosis present

## 2015-06-18 DIAGNOSIS — J9601 Acute respiratory failure with hypoxia: Secondary | ICD-10-CM | POA: Diagnosis present

## 2015-06-18 DIAGNOSIS — E119 Type 2 diabetes mellitus without complications: Secondary | ICD-10-CM | POA: Diagnosis not present

## 2015-06-18 DIAGNOSIS — R0602 Shortness of breath: Secondary | ICD-10-CM

## 2015-06-18 DIAGNOSIS — R739 Hyperglycemia, unspecified: Secondary | ICD-10-CM

## 2015-06-18 DIAGNOSIS — Z87891 Personal history of nicotine dependence: Secondary | ICD-10-CM | POA: Diagnosis present

## 2015-06-18 DIAGNOSIS — F17210 Nicotine dependence, cigarettes, uncomplicated: Secondary | ICD-10-CM | POA: Diagnosis not present

## 2015-06-18 DIAGNOSIS — J9600 Acute respiratory failure, unspecified whether with hypoxia or hypercapnia: Secondary | ICD-10-CM | POA: Diagnosis not present

## 2015-06-18 DIAGNOSIS — Z833 Family history of diabetes mellitus: Secondary | ICD-10-CM

## 2015-06-18 DIAGNOSIS — J439 Emphysema, unspecified: Secondary | ICD-10-CM | POA: Diagnosis present

## 2015-06-18 DIAGNOSIS — T380X5A Adverse effect of glucocorticoids and synthetic analogues, initial encounter: Secondary | ICD-10-CM

## 2015-06-18 DIAGNOSIS — E099 Drug or chemical induced diabetes mellitus without complications: Secondary | ICD-10-CM | POA: Diagnosis present

## 2015-06-18 DIAGNOSIS — I10 Essential (primary) hypertension: Secondary | ICD-10-CM | POA: Diagnosis present

## 2015-06-18 DIAGNOSIS — Z720 Tobacco use: Secondary | ICD-10-CM

## 2015-06-18 DIAGNOSIS — F17200 Nicotine dependence, unspecified, uncomplicated: Secondary | ICD-10-CM | POA: Diagnosis not present

## 2015-06-18 DIAGNOSIS — J9611 Chronic respiratory failure with hypoxia: Secondary | ICD-10-CM

## 2015-06-18 DIAGNOSIS — J441 Chronic obstructive pulmonary disease with (acute) exacerbation: Secondary | ICD-10-CM | POA: Diagnosis present

## 2015-06-18 DIAGNOSIS — J449 Chronic obstructive pulmonary disease, unspecified: Secondary | ICD-10-CM

## 2015-06-18 DIAGNOSIS — R7301 Impaired fasting glucose: Secondary | ICD-10-CM | POA: Diagnosis not present

## 2015-06-18 DIAGNOSIS — R0600 Dyspnea, unspecified: Secondary | ICD-10-CM | POA: Diagnosis not present

## 2015-06-18 LAB — CBC WITH DIFFERENTIAL/PLATELET
BASOS ABS: 0 10*3/uL (ref 0.0–0.1)
BASOS PCT: 0 %
EOS ABS: 0 10*3/uL (ref 0.0–0.7)
Eosinophils Relative: 0 %
HCT: 37.4 % (ref 36.0–46.0)
HEMOGLOBIN: 11.2 g/dL — AB (ref 12.0–15.0)
Lymphocytes Relative: 8 %
Lymphs Abs: 0.5 10*3/uL — ABNORMAL LOW (ref 0.7–4.0)
MCH: 23.7 pg — AB (ref 26.0–34.0)
MCHC: 29.9 g/dL — AB (ref 30.0–36.0)
MCV: 79.2 fL (ref 78.0–100.0)
MONO ABS: 0.1 10*3/uL (ref 0.1–1.0)
Monocytes Relative: 2 %
NEUTROS ABS: 5.4 10*3/uL (ref 1.7–7.7)
NEUTROS PCT: 90 %
Platelets: 307 10*3/uL (ref 150–400)
RBC: 4.72 MIL/uL (ref 3.87–5.11)
RDW: 16.9 % — ABNORMAL HIGH (ref 11.5–15.5)
WBC: 6 10*3/uL (ref 4.0–10.5)

## 2015-06-18 LAB — COMPREHENSIVE METABOLIC PANEL
ALK PHOS: 88 U/L (ref 38–126)
ALT: 14 U/L (ref 14–54)
ANION GAP: 11 (ref 5–15)
AST: 18 U/L (ref 15–41)
Albumin: 3.4 g/dL — ABNORMAL LOW (ref 3.5–5.0)
BUN: 18 mg/dL (ref 6–20)
CALCIUM: 8.9 mg/dL (ref 8.9–10.3)
CO2: 26 mmol/L (ref 22–32)
CREATININE: 0.64 mg/dL (ref 0.44–1.00)
Chloride: 96 mmol/L — ABNORMAL LOW (ref 101–111)
Glucose, Bld: 319 mg/dL — ABNORMAL HIGH (ref 65–99)
Potassium: 3.9 mmol/L (ref 3.5–5.1)
Sodium: 133 mmol/L — ABNORMAL LOW (ref 135–145)
TOTAL PROTEIN: 6.8 g/dL (ref 6.5–8.1)
Total Bilirubin: 0.3 mg/dL (ref 0.3–1.2)

## 2015-06-18 LAB — I-STAT TROPONIN, ED: TROPONIN I, POC: 0 ng/mL (ref 0.00–0.08)

## 2015-06-18 LAB — BRAIN NATRIURETIC PEPTIDE: B NATRIURETIC PEPTIDE 5: 39.3 pg/mL (ref 0.0–100.0)

## 2015-06-18 MED ORDER — METHYLPREDNISOLONE SODIUM SUCC 125 MG IJ SOLR
60.0000 mg | Freq: Four times a day (QID) | INTRAMUSCULAR | Status: DC
  Administered 2015-06-18 – 2015-06-20 (×6): 60 mg via INTRAVENOUS
  Filled 2015-06-18 (×5): qty 2

## 2015-06-18 MED ORDER — FLUTICASONE PROPIONATE 50 MCG/ACT NA SUSP
2.0000 | Freq: Two times a day (BID) | NASAL | Status: DC
  Administered 2015-06-19 – 2015-06-23 (×5): 2 via NASAL
  Filled 2015-06-18 (×2): qty 16

## 2015-06-18 MED ORDER — SIMVASTATIN 20 MG PO TABS
20.0000 mg | ORAL_TABLET | Freq: Every day | ORAL | Status: DC
  Administered 2015-06-18 – 2015-06-22 (×5): 20 mg via ORAL
  Filled 2015-06-18 (×5): qty 1

## 2015-06-18 MED ORDER — ALBUTEROL SULFATE (2.5 MG/3ML) 0.083% IN NEBU
2.5000 mg | INHALATION_SOLUTION | RESPIRATORY_TRACT | Status: DC | PRN
  Administered 2015-06-21: 2.5 mg via RESPIRATORY_TRACT
  Filled 2015-06-18: qty 3

## 2015-06-18 MED ORDER — INSULIN ASPART 100 UNIT/ML ~~LOC~~ SOLN
0.0000 [IU] | Freq: Three times a day (TID) | SUBCUTANEOUS | Status: DC
  Administered 2015-06-19: 8 [IU] via SUBCUTANEOUS
  Administered 2015-06-19 – 2015-06-20 (×4): 5 [IU] via SUBCUTANEOUS
  Administered 2015-06-20: 8 [IU] via SUBCUTANEOUS
  Administered 2015-06-21 (×2): 5 [IU] via SUBCUTANEOUS
  Administered 2015-06-21: 15 [IU] via SUBCUTANEOUS
  Administered 2015-06-22 – 2015-06-23 (×4): 5 [IU] via SUBCUTANEOUS
  Administered 2015-06-23: 3 [IU] via SUBCUTANEOUS

## 2015-06-18 MED ORDER — ACETAMINOPHEN 325 MG PO TABS: 650.0000 mg | ORAL_TABLET | Freq: Four times a day (QID) | ORAL | Status: DC | PRN

## 2015-06-18 MED ORDER — SODIUM CHLORIDE 0.9 % IJ SOLN
3.0000 mL | Freq: Two times a day (BID) | INTRAMUSCULAR | Status: DC
  Administered 2015-06-19 – 2015-06-23 (×9): 3 mL via INTRAVENOUS

## 2015-06-18 MED ORDER — INSULIN ASPART 100 UNIT/ML ~~LOC~~ SOLN
0.0000 [IU] | Freq: Every day | SUBCUTANEOUS | Status: DC
  Administered 2015-06-19: 4 [IU] via SUBCUTANEOUS
  Administered 2015-06-20: 3 [IU] via SUBCUTANEOUS
  Administered 2015-06-21: 4 [IU] via SUBCUTANEOUS
  Administered 2015-06-22: 3 [IU] via SUBCUTANEOUS

## 2015-06-18 MED ORDER — AMLODIPINE BESYLATE 10 MG PO TABS
10.0000 mg | ORAL_TABLET | Freq: Every day | ORAL | Status: DC
  Administered 2015-06-19 – 2015-06-23 (×5): 10 mg via ORAL
  Filled 2015-06-18 (×6): qty 1

## 2015-06-18 MED ORDER — ONDANSETRON HCL 4 MG PO TABS: 4.0000 mg | ORAL_TABLET | Freq: Four times a day (QID) | ORAL | Status: DC | PRN

## 2015-06-18 MED ORDER — LEVOFLOXACIN IN D5W 750 MG/150ML IV SOLN
750.0000 mg | INTRAVENOUS | Status: DC
  Administered 2015-06-18: 750 mg via INTRAVENOUS
  Filled 2015-06-18: qty 150

## 2015-06-18 MED ORDER — DEXTROSE 5 % IV SOLN
500.0000 mg | Freq: Once | INTRAVENOUS | Status: AC
  Administered 2015-06-18: 500 mg via INTRAVENOUS
  Filled 2015-06-18: qty 500

## 2015-06-18 MED ORDER — ONDANSETRON HCL 4 MG/2ML IJ SOLN: 4.0000 mg | Freq: Four times a day (QID) | INTRAMUSCULAR | Status: DC | PRN

## 2015-06-18 MED ORDER — NITROGLYCERIN 0.4 MG SL SUBL: 0.4000 mg | SUBLINGUAL_TABLET | SUBLINGUAL | Status: DC | PRN

## 2015-06-18 MED ORDER — ENOXAPARIN SODIUM 40 MG/0.4ML ~~LOC~~ SOLN
40.0000 mg | SUBCUTANEOUS | Status: DC
  Administered 2015-06-19 – 2015-06-23 (×5): 40 mg via SUBCUTANEOUS
  Filled 2015-06-18 (×5): qty 0.4

## 2015-06-18 MED ORDER — IPRATROPIUM-ALBUTEROL 0.5-2.5 (3) MG/3ML IN SOLN
3.0000 mL | Freq: Once | RESPIRATORY_TRACT | Status: AC
  Administered 2015-06-18: 3 mL via RESPIRATORY_TRACT
  Filled 2015-06-18: qty 3

## 2015-06-18 MED ORDER — DIPHENHYDRAMINE HCL 25 MG PO CAPS
50.0000 mg | ORAL_CAPSULE | Freq: Every day | ORAL | Status: DC
  Administered 2015-06-18 – 2015-06-22 (×5): 50 mg via ORAL
  Filled 2015-06-18 (×5): qty 2
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
## 2015-06-18 NOTE — ED Notes (Signed)
Sick since Friday, cough/congestion. C/o "spasms in ribcage"

## 2015-06-18 NOTE — ED Notes (Signed)
Pt being transferred to Er via EMS in stable condition. Applied 2 liters [place] with improvement  Wheezing throughout, labored breathing  Report given to [employer] nurse [place]

## 2015-06-18 NOTE — Progress Notes (Signed)
New Admission Note:   Arrival Method: stretcher Mental Orientation: alert and oriented Telemetry: Assessment: Completed Skin:  IV    Ns@50  Pain: Tubes:none Safety Measures: Safety Fall Prevention Plan has been given, discussed and signed Admission: [address] Orientation: Patient has been orientated to the room, unit and staff.  Family:none  Orders have been reviewed and implemented. Will continue to monitor the patient. Call light has been placed within reach and bed alarm has been activated.   Clement Sayresathie Emersynn Deatley, RN Phone number: [phone]
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
## 2015-06-18 NOTE — ED Notes (Signed)
Patient from urgent care.  C/O dyspnea since Friday.  Dyspnea progressivly worsened since Friday.  To urgent care today then [facility]  Patient given 5 mg albuterol and 125 solu-medrol per EMS with improvement.

## 2015-06-19 ENCOUNTER — Encounter (HOSPITAL_COMMUNITY): Payer: Self-pay | Admitting: *Deleted

## 2015-06-19 LAB — CBC
HCT: 35.8 % — ABNORMAL LOW (ref 36.0–46.0)
HEMOGLOBIN: 10.8 g/dL — AB (ref 12.0–15.0)
MCH: 24 pg — AB (ref 26.0–34.0)
MCHC: 30.2 g/dL (ref 30.0–36.0)
MCV: 79.6 fL (ref 78.0–100.0)
PLATELETS: 315 10*3/uL (ref 150–400)
RBC: 4.5 MIL/uL (ref 3.87–5.11)
RDW: 16.6 % — ABNORMAL HIGH (ref 11.5–15.5)
WBC: 4.2 10*3/uL (ref 4.0–10.5)

## 2015-06-19 LAB — BASIC METABOLIC PANEL
ANION GAP: 9 (ref 5–15)
BUN: 16 mg/dL (ref 6–20)
CALCIUM: 9 mg/dL (ref 8.9–10.3)
CHLORIDE: 97 mmol/L — AB (ref 101–111)
CO2: 30 mmol/L (ref 22–32)
CREATININE: 0.59 mg/dL (ref 0.44–1.00)
GFR calc non Af Amer: >60 mL/min (ref >60)
Glucose, Bld: 303 mg/dL — ABNORMAL HIGH (ref 65–99)
Potassium: 4.5 mmol/L (ref 3.5–5.1)
SODIUM: 136 mmol/L (ref 135–145)

## 2015-06-19 LAB — TROPONIN I

## 2015-06-19 LAB — GLUCOSE, CAPILLARY
GLUCOSE-CAPILLARY: 218 mg/dL — AB (ref 65–99)
GLUCOSE-CAPILLARY: 252 mg/dL — AB (ref 65–99)
GLUCOSE-CAPILLARY: 345 mg/dL — AB (ref 65–99)
Glucose-Capillary: 235 mg/dL — ABNORMAL HIGH (ref 65–99)
Glucose-Capillary: 385 mg/dL — ABNORMAL HIGH (ref 65–99)

## 2015-06-19 MED ORDER — INSULIN GLARGINE 100 UNIT/ML ~~LOC~~ SOLN
30.0000 [IU] | Freq: Every day | SUBCUTANEOUS | Status: DC
  Administered 2015-06-19 – 2015-06-20 (×2): 30 [IU] via SUBCUTANEOUS
  Filled 2015-06-19 (×2): qty 0.3

## 2015-06-19 MED ORDER — INFLUENZA VAC SPLIT QUAD 0.5 ML IM SUSY
0.5000 mL | PREFILLED_SYRINGE | INTRAMUSCULAR | Status: AC
  Administered 2015-06-20: 0.5 mL via INTRAMUSCULAR
  Filled 2015-06-19 (×2): qty 0.5

## 2015-06-19 MED ORDER — LEVOFLOXACIN 750 MG PO TABS
750.0000 mg | ORAL_TABLET | Freq: Every day | ORAL | Status: DC
  Administered 2015-06-19 – 2015-06-23 (×5): 750 mg via ORAL
  Filled 2015-06-19 (×5): qty 1

## 2015-06-19 NOTE — Progress Notes (Signed)
Inpatient Diabetes Program Recommendations  AACE/ADA: New Consensus Statement on Inpatient Glycemic Control (2015)  Target Ranges:  Prepandial:   less than 140 mg/dL      Peak postprandial:   less than 180 mg/dL (1-2 hours)      Critically ill patients:  140 - 180 mg/dL   Review of Glycemic Control  Diabetes history: None in history Current orders for Inpatient glycemic control: Novolog Moderate TID + HS scale  Inpatient Diabetes Program Recommendations: Correction (SSI): Patient on IV Solumedrol 60 mg 4x/day. Fasting glucose 218 mg/dl this am. May want to consider increasing Correction to Novolog Resistant or to Q4hrs instead of TID. HgbA1C: Last A1c was in June 2016 and was 6.7% at that time which meets criteria for new DM diagnosis. This admission A1c not drawn.   Thanks,  Christena DeemShannon Clearance Chenault RN, MSN, [facility] Inpatient Diabetes Coordinator Team Pager [phone] (8a-5p)

## 2015-06-19 NOTE — Progress Notes (Addendum)
[facility] PROGRESS NOTE  Yvonne Todd DOB: 1951/06/22 DOA: 06/18/2015 PCP: Myrlene BrokerElizabeth A Crawford, MD  Length of stay: 1   Assessment/Plan: Active Problems:   Tobacco use disorder   Impaired fasting blood sugar   Essential hypertension   COPD exacerbation (HCC)   Acute respiratory failure (HCC)    Acute hypoxemic respiratory failure secondary to COPD exacerbation xray consistent with emphysema -O2- wean as tolerated  Hyperglycemia-extremely uncontrolled, will start the patient on Lantus -check HgbA1C -SSI for now -diabetic diet  Acute COPD exacerbation- Changed Levaquin IV to by mouth Continue IV Solu-Medrol -outpatient PFTs  Tobacco abuse- -encourage cessation  DVT prophylaxsis Lovenox  Code Status:      Code Status Orders        Start     Ordered   06/18/15 2227  Full code   Continuous     06/18/15 2226     Family Communication: family updated about patient's clinical progress Disposition Plan: Discharge Tomorrow  Brief narrative: Smoker with PMHx of HTN/HLD. She developed URI symptoms Friday including cough and congestion. She then presented to the Urgent Care center after worsening SOB. She was wheezing and become very weak- falling out her bed on Friday. EMS was called to transport patient to ER from the urgent care and they gave 125mg  of solumedrol and she was placed on 2L [place].  Patient did have some subjective fevers and some chills.   IN the ER, chest xray did not show PNA but did show flattening of her diaphragm consistent with emphysema. Labs showed an elevated glucose and low Na that corrected with blood sugar. Patient had some improvement of breathing and was asking for food and drink. Hospitalist were asked to admit for COPD   Consultants:  None  Procedures:  None  Antibiotics: Anti-infectives    Start     Dose/Rate Route Frequency Ordered Stop   06/19/15 1245  levofloxacin (LEVAQUIN) tablet 750 mg      750 mg Oral Daily 06/19/15 1236     06/18/15 1930  levofloxacin (LEVAQUIN) IVPB 750 mg  Status:  Discontinued     750 mg 100 mL/hr over 90 Minutes Intravenous Every 24 hours 06/18/15 1915 06/19/15 1236   06/18/15 1715  azithromycin (ZITHROMAX) 500 mg in dextrose 5 % 250 mL IVPB     500 mg 250 mL/hr over 60 Minutes Intravenous  Once 06/18/15 1713 06/18/15 1842         HPI/Subjective: Patient still wheezing, does not use oxygen at home, no chest pain  Objective: Filed Vitals:   06/18/15 1824 06/18/15 2057 06/19/15 0500 06/19/15 0847  BP: 130/49 132/48 147/59 135/47  Pulse: 88 77 80 68  Temp:  98.3 F (36.8 C) 98.6 F (37 C) 97.7 F (36.5 C)  TempSrc:  Oral Oral Oral  Resp: 14 16 18 19   Height:  5\' 2"  (1.575 m)    Weight:  84.596 kg (186 lb 8 oz)    SpO2: 100% 92% 96% 95%    Intake/Output Summary (Last 24 hours) at 06/19/15 1237 Last data filed at 06/19/15 0848  Gross per 24 hour  Intake    360 ml  Output    300 ml  Net     60 ml    Exam:  General: No acute respiratory distress Lungs: Mild by basilar wheezing Cardiovascular: Regular rate and rhythm without murmur gallop or rub normal S1 and S2 Abdomen: Nontender, nondistended, soft, bowel sounds positive, no  rebound, no ascites, no appreciable mass Extremities: No significant cyanosis, clubbing, or edema bilateral lower extremities   Data Review   Micro Results No results found for this or any previous visit (from the past 240 hour(s)).  Radiology Reports Dg Chest 2 View  06/18/2015  CLINICAL DATA:  Progressive shortness of breath.  Productive cough. EXAM: CHEST  2 VIEW COMPARISON:  06/23/2014 and 06/01/2014 FINDINGS: Heart size and pulmonary vascularity are normal. The lungs are clear. No acute osseous abnormality. The lungs are somewhat hyperinflated the diaphragm is flattened on the lateral view consistent with emphysema. IMPRESSION: No acute abnormality.  Emphysema. Electronically Signed   By: Francene Boyers M.D.   On: 06/18/2015 15:57     CBC  Recent Labs Lab 06/18/15 1623 06/19/15 0344  WBC 6.0 4.2  HGB 11.2* 10.8*  HCT 37.4 35.8*  PLT 307 315  MCV 79.2 79.6  MCH 23.7* 24.0*  MCHC 29.9* 30.2  RDW 16.9* 16.6*  LYMPHSABS 0.5*  --   MONOABS 0.1  --   EOSABS 0.0  --   BASOSABS 0.0  --     Chemistries   Recent Labs Lab 06/18/15 1623 06/19/15 0344  NA 133* 136  K 3.9 4.5  CL 96* 97*  CO2 26 30  GLUCOSE 319* 303*  BUN 18 16  CREATININE 0.64 0.59  CALCIUM 8.9 9.0  AST 18  --   ALT 14  --   ALKPHOS 88  --   BILITOT 0.3  --    ------------------------------------------------------------------------------------------------------------------ estimated creatinine clearance is 70.7 mL/min (by C-G formula based on Cr of 0.59). ------------------------------------------------------------------------------------------------------------------ No results for input(s): HGBA1C in the last 72 hours. ------------------------------------------------------------------------------------------------------------------ No results for input(s): CHOL, HDL, LDLCALC, TRIG, CHOLHDL, LDLDIRECT in the last 72 hours. ------------------------------------------------------------------------------------------------------------------ No results for input(s): TSH, T4TOTAL, T3FREE, THYROIDAB in the last 72 hours.  Invalid input(s): FREET3 ------------------------------------------------------------------------------------------------------------------ No results for input(s): VITAMINB12, FOLATE, FERRITIN, TIBC, IRON, RETICCTPCT in the last 72 hours.  Coagulation profile No results for input(s): INR, PROTIME in the last 168 hours.  No results for input(s): DDIMER in the last 72 hours.  Cardiac Enzymes No results for input(s): CKMB, TROPONINI, MYOGLOBIN in the last 168 hours.  Invalid input(s):  CK ------------------------------------------------------------------------------------------------------------------ Invalid input(s): POCBNP   CBG:  Recent Labs Lab 06/19/15 06/19/15 0746 06/19/15 1140  GLUCAP 385* 218* 235*       Studies: Dg Chest 2 View  06/18/2015  CLINICAL DATA:  Progressive shortness of breath.  Productive cough. EXAM: CHEST  2 VIEW COMPARISON:  06/23/2014 and 06/01/2014 FINDINGS: Heart size and pulmonary vascularity are normal. The lungs are clear. No acute osseous abnormality. The lungs are somewhat hyperinflated the diaphragm is flattened on the lateral view consistent with emphysema. IMPRESSION: No acute abnormality.  Emphysema. Electronically Signed   By: Francene Boyers M.D.   On: 06/18/2015 15:57      Lab Results  Component Value Date   HGBA1C 6.7* 02/21/2015   HGBA1C 6.3* 03/19/2014   Lab Results  Component Value Date   LDLCALC 214* 02/21/2015   CREATININE 0.59 06/19/2015       Scheduled Meds: . amLODipine  10 mg Oral Daily  . diphenhydrAMINE  50 mg Oral QHS  . enoxaparin (LOVENOX) injection  40 mg Subcutaneous Q24H  . fluticasone  2 spray Each Nare BID  . [START ON 06/20/2015] Influenza vac split quadrivalent PF  0.5 mL Intramuscular Tomorrow-1000  . insulin aspart  0-15 Units Subcutaneous TID WC  . insulin aspart  0-5 Units Subcutaneous QHS  . levofloxacin  750 mg Oral Daily  . methylPREDNISolone (SOLU-MEDROL) injection  60 mg Intravenous 4 times per day  . simvastatin  20 mg Oral QHS  . sodium chloride  3 mL Intravenous Q12H   Continuous Infusions:   Active Problems:   Tobacco use disorder   Impaired fasting blood sugar   Essential hypertension   COPD exacerbation (HCC)   Acute respiratory failure (HCC)    Time spent: 45 minutes   [facility]  [facility] Pager [phone]. If 7PM-7AM, please contact night-coverage at [email or web address], password [facility] 06/19/2015, 12:37 PM  LOS: 1 day

## 2015-06-19 NOTE — Progress Notes (Signed)
Utilization review completed. Hser Belanger, RN, BSN. 

## 2015-06-20 DIAGNOSIS — J439 Emphysema, unspecified: Secondary | ICD-10-CM | POA: Diagnosis not present

## 2015-06-20 LAB — GLUCOSE, CAPILLARY
GLUCOSE-CAPILLARY: 369 mg/dL — AB (ref 65–99)
GLUCOSE-CAPILLARY: 245 mg/dL — AB (ref 65–99)
GLUCOSE-CAPILLARY: 258 mg/dL — AB (ref 65–99)
Glucose-Capillary: 260 mg/dL — ABNORMAL HIGH (ref 65–99)

## 2015-06-20 LAB — BASIC METABOLIC PANEL
Anion gap: 8 (ref 5–15)
BUN: 18 mg/dL (ref 6–20)
CHLORIDE: 95 mmol/L — AB (ref 101–111)
CO2: 32 mmol/L (ref 22–32)
CREATININE: 0.57 mg/dL (ref 0.44–1.00)
Calcium: 8.6 mg/dL — ABNORMAL LOW (ref 8.9–10.3)
GFR calc Af Amer: >60 mL/min (ref >60)
GFR calc non Af Amer: >60 mL/min (ref >60)
GLUCOSE: 286 mg/dL — AB (ref 65–99)
Potassium: 5 mmol/L (ref 3.5–5.1)
SODIUM: 135 mmol/L (ref 135–145)

## 2015-06-20 LAB — HEMOGLOBIN A1C
Hgb A1c MFr Bld: 7.6 % — ABNORMAL HIGH (ref 4.8–5.6)
Mean Plasma Glucose: 171 mg/dL

## 2015-06-20 MED ORDER — GLIPIZIDE 5 MG PO TABS
5.0000 mg | ORAL_TABLET | Freq: Two times a day (BID) | ORAL | Status: DC
  Administered 2015-06-20 – 2015-06-21 (×2): 5 mg via ORAL
  Filled 2015-06-20 (×4): qty 1

## 2015-06-20 MED ORDER — GUAIFENESIN-CODEINE 100-10 MG/5ML PO SOLN: 5.0000 mL | Freq: Four times a day (QID) | ORAL | Status: DC | PRN

## 2015-06-20 MED ORDER — METHYLPREDNISOLONE SODIUM SUCC 125 MG IJ SOLR
60.0000 mg | Freq: Two times a day (BID) | INTRAMUSCULAR | Status: DC
  Administered 2015-06-20 – 2015-06-21 (×2): 60 mg via INTRAVENOUS
  Filled 2015-06-20: qty 2

## 2015-06-20 MED ORDER — SODIUM CHLORIDE 0.9 % IV SOLN: INTRAVENOUS | Status: AC

## 2015-06-20 MED ORDER — GUAIFENESIN ER 600 MG PO TB12
600.0000 mg | ORAL_TABLET | Freq: Two times a day (BID) | ORAL | Status: DC
  Administered 2015-06-20 – 2015-06-23 (×7): 600 mg via ORAL
  Filled 2015-06-20 (×7): qty 1

## 2015-06-20 MED ORDER — INSULIN GLARGINE 100 UNIT/ML ~~LOC~~ SOLN
20.0000 [IU] | Freq: Every day | SUBCUTANEOUS | Status: DC
  Administered 2015-06-21 – 2015-06-23 (×3): 20 [IU] via SUBCUTANEOUS
  Filled 2015-06-20 (×4): qty 0.2

## 2015-06-20 NOTE — Progress Notes (Addendum)
Inpatient Diabetes Program Recommendations  AACE/ADA: New Consensus Statement on Inpatient Glycemic Control (2015)  Target Ranges:  Prepandial:   less than 140 mg/dL      Peak postprandial:   less than 180 mg/dL (1-2 hours)      Critically ill patients:  140 - 180 mg/dL   Review of Glycemic Control  Diabetes history: None in history Current orders for Inpatient glycemic control: Novolog Moderate TID + HS scale, Lantus 30 units  Inpatient Diabetes Program Recommendations:  Insulin - Basal: Glucose still in the 200's after lantus 30 units given yesterday. Please consider increasing basal dose to 38 units while patient is still on IV Solumedrol.    Thanks,  Christena DeemShannon Fabianna Keats RN, MSN, [facility] Inpatient Diabetes Coordinator Team Pager [phone] (8a-5p)

## 2015-06-20 NOTE — Progress Notes (Signed)
[facility] PROGRESS NOTE  ARNECIA ECTOR WUJ:[id number] DOB: 07/01/1951 DOA: 06/18/2015 PCP: Myrlene Broker, MD  Length of stay: 2   Assessment/Plan: Active Problems:   Tobacco use disorder   Impaired fasting blood sugar   Essential hypertension   COPD exacerbation (HCC)   Acute respiratory failure (HCC)    Acute hypoxemic respiratory failure secondary to COPD exacerbation xray consistent with emphysema -O2- wean as tolerated  New onset diabetes type 2, patient's mother has diabetes Hemoglobin A1c 7.6, currently on Lantus and SSI Plan is to taper steroids and transition to by mouth glipizide 5 mg twice a day -diabetic diet Glucometer teaching  Acute COPD  continue Levaquin Taper IV Solu-Medrol, start Mucinex And Robitussin-DM cough syrup -outpatient PFTs nicotine cessation counseling done  Tobacco abuse- -encourage cessation  DVT prophylaxsis Lovenox  Code Status:      Code Status Orders        Start     Ordered   06/18/15 2227  Full code   Continuous     06/18/15 2226     Family Communication: family updated about patient's clinical progress Disposition Plan: PT/OT eval, patient refuses to discharge today because of weakness  Brief narrative: Smoker with PMHx of HTN/HLD. She developed URI symptoms Friday including cough and congestion. She then presented to the Urgent Care center after worsening SOB. She was wheezing and become very weak- falling out her bed on Friday. EMS was called to transport patient to ER from the urgent care and they gave  of solumedrol and she was placed on 2L [place].  Patient did have some subjective fevers and some chills.   IN the ER, chest xray did not show PNA but did show flattening of her diaphragm consistent with emphysema. Labs showed an elevated glucose and low Na that corrected with blood sugar. Patient had some improvement of breathing and was asking for food and drink. Hospitalist were asked to  admit for COPD   Consultants:  None  Procedures:  None  Antibiotics: Anti-infectives    Start     Dose/Rate Route Frequency Ordered Stop   06/19/15 1245  levofloxacin (LEVAQUIN) tablet 750 mg     750 mg Oral Daily 06/19/15 1236     06/18/15 1930  levofloxacin (LEVAQUIN) IVPB 750 mg  Status:  Discontinued     750 mg 100 mL/hr over 90 Minutes Intravenous Every 24 hours 06/18/15 1915 06/19/15 1236   06/18/15 1715  azithromycin (ZITHROMAX) 500 mg in dextrose 5 % 250 mL IVPB     500 mg 250 mL/hr over 60 Minutes Intravenous  Once 06/18/15 1713 06/18/15 1842         Complaining of having generalized weakness, chest tightness has improved, still has mucus"stuck in her throat.   Objective: Filed Vitals:   06/19/15 0847 06/19/15 2100 06/20/15 0500 06/20/15 0945  BP: 135/47 128/74 133/52 131/83  Pulse: 68 73 74 69  Temp: 97.7 F (36.5 C) 98 F (36.7 C) 98.1 F (36.7 C) 98.5 F (36.9 C)  TempSrc: Oral Oral Oral Oral  Resp: Height:      Weight:  85.231 kg (187 lb 14.4 oz)    SpO2: 95% 93% 92% 98%    Intake/Output Summary (Last 24 hours) at 06/20/15 1254 Last data filed at 06/20/15 0845  Gross per 24 hour  Intake    840 ml  Output    200 ml  Net    640  ml    Exam:  General: No acute respiratory distress Lungs: Mild by basilar wheezing Cardiovascular: Regular rate and rhythm without murmur gallop or rub normal S1 and S2 Abdomen: Nontender, nondistended, soft, bowel sounds positive, no rebound, no ascites, no appreciable mass Extremities: No significant cyanosis, clubbing, or edema bilateral lower extremities   Data Review   Micro Results No results found for this or any previous visit (from the past 240 hour(s)).  Radiology Reports Dg Chest 2 View  06/18/2015  CLINICAL DATA:  Progressive shortness of breath.  Productive cough. EXAM: CHEST  2 VIEW COMPARISON:  06/23/2014 and 06/01/2014 FINDINGS: Heart size and pulmonary vascularity are normal.  The lungs are clear. No acute osseous abnormality. The lungs are somewhat hyperinflated the diaphragm is flattened on the lateral view consistent with emphysema. IMPRESSION: No acute abnormality.  Emphysema. Electronically Signed   By: Francene BoyersJames  Maxwell M.D.   On: 06/18/2015 15:57     CBC  Recent Labs Lab 06/18/15 1623 06/19/15 0344  WBC 6.0 4.2  HGB 11.2* 10.8*  HCT 37.4 35.8*  PLT 307 315  MCV 79.2 79.6  MCH 23.7* 24.0*  MCHC 29.9* 30.2  RDW 16.9* 16.6*  LYMPHSABS 0.5*  --   MONOABS 0.1  --   EOSABS 0.0  --   BASOSABS 0.0  --     Chemistries   Recent Labs Lab 06/18/15 1623 06/19/15 0344 06/20/15 0352  NA 133* 136 135  K 3.9 4.5 5.0  CL 96* 97* 95*  CO2 26 30 32  GLUCOSE 319* 303* 286*  BUN 18 16 18   CREATININE 0.64 0.59 0.57  CALCIUM 8.9 9.0 8.6*  AST 18  --   --   ALT 14  --   --   ALKPHOS 88  --   --   BILITOT 0.3  --   --    ------------------------------------------------------------------------------------------------------------------ estimated creatinine clearance is 70.9 mL/min (by C-G formula based on Cr of 0.57). ------------------------------------------------------------------------------------------------------------------  Recent Labs  06/18/15 1623  HGBA1C 7.6*   ------------------------------------------------------------------------------------------------------------------ No results for input(s): CHOL, HDL, LDLCALC, TRIG, CHOLHDL, LDLDIRECT in the last 72 hours. ------------------------------------------------------------------------------------------------------------------ No results for input(s): TSH, T4TOTAL, T3FREE, THYROIDAB in the last 72 hours.  Invalid input(s): FREET3 ------------------------------------------------------------------------------------------------------------------ No results for input(s): VITAMINB12, FOLATE, FERRITIN, TIBC, IRON, RETICCTPCT in the last 72 hours.  Coagulation profile No results for input(s):  INR, PROTIME in the last 168 hours.  No results for input(s): DDIMER in the last 72 hours.  Cardiac Enzymes  Recent Labs Lab 06/19/15 1505  TROPONINI <0.03   ------------------------------------------------------------------------------------------------------------------ Invalid input(s): POCBNP   CBG:  Recent Labs Lab 06/19/15 1140 06/19/15 1654 06/19/15 2129 06/20/15 0752 06/20/15 1149  GLUCAP 235* 252* 345* 260* 369*       Studies: Dg Chest 2 View  06/18/2015  CLINICAL DATA:  Progressive shortness of breath.  Productive cough. EXAM: CHEST  2 VIEW COMPARISON:  06/23/2014 and 06/01/2014 FINDINGS: Heart size and pulmonary vascularity are normal. The lungs are clear. No acute osseous abnormality. The lungs are somewhat hyperinflated the diaphragm is flattened on the lateral view consistent with emphysema. IMPRESSION: No acute abnormality.  Emphysema. Electronically Signed   By: Francene BoyersJames  Maxwell M.D.   On: 06/18/2015 15:57      Lab Results  Component Value Date   HGBA1C 7.6* 06/18/2015   HGBA1C 6.7* 02/21/2015   HGBA1C 6.3* 03/19/2014   Lab Results  Component Value Date   LDLCALC 214* 02/21/2015   CREATININE 0.57 06/20/2015  Scheduled Meds: . amLODipine  10 mg Oral Daily  . diphenhydrAMINE  50 mg Oral QHS  . enoxaparin (LOVENOX) injection  40 mg Subcutaneous Q24H  . fluticasone  2 spray Each Nare BID  . glipiZIDE  5 mg Oral BID AC  . guaiFENesin  600 mg Oral BID  . Influenza vac split quadrivalent PF  0.5 mL Intramuscular Tomorrow-1000  . insulin aspart  0-15 Units Subcutaneous TID WC  . insulin aspart  0-5 Units Subcutaneous QHS  . [START ON 06/21/2015] insulin glargine  20 Units Subcutaneous Daily  . levofloxacin  750 mg Oral Daily  . methylPREDNISolone (SOLU-MEDROL) injection  60 mg Intravenous Q12H  . simvastatin  20 mg Oral QHS  . sodium chloride  3 mL Intravenous Q12H   Continuous Infusions: . sodium chloride      Active Problems:    Tobacco use disorder   Impaired fasting blood sugar   Essential hypertension   COPD exacerbation (HCC)   Acute respiratory failure (HCC)    Time spent: 45 minutes   [facility]  [facility] Pager [phone]. If 7PM-7AM, please contact night-coverage at [email or web address], password [facility] 06/20/2015, 12:54 PM  LOS: 2 days

## 2015-06-20 NOTE — Evaluation (Signed)
Physical Therapy Evaluation Patient Details Name: Yvonne Todd MRN: [id number] DOB: Dec 14, 1950 Today's Date: 06/20/2015   History of Present Illness  Pt is a 64 yo female Smoker with PMHx of HTN/HLD. She developed URI symptoms Friday including cough and congestion. She then presented to the Urgent Care center after worsening SOB. She was wheezing and become very weak- falling out her bed on Friday..  Clinical Impression  Pt with generalized weakness, decreased activity tolerance and DOE with mobility.Pt to benefit from RW for long distance ambulation due to decreased SpO2 to 85% on 2LO2 via [place]. Pt with good home set up and support but was completely indep PTA and caring for mother. Would benefit from HHPT to address mentioned deficits.     Follow Up Recommendations Home health PT; intermittent supervision    Equipment Recommendations  Rolling walker with 5" wheels    Recommendations for Other Services       Precautions / Restrictions Precautions Precaution Comments: SOB with mobiltiy Restrictions Weight Bearing Restrictions: No   SATURATION QUALIFICATIONS: (This note is used to comply with regulatory documentation for home oxygen)  Patient Saturations on Room Air at Rest = 87%  Patient Saturations on Room Air while Ambulating = 79-80%  Patient Saturations on 2 Liters of oxygen while Ambulating = 85%  Please briefly explain why patient needs home oxygen: unable to maintain >88% on RA     Mobility  Bed Mobility Overal bed mobility: Modified Independent                Transfers Overall transfer level: Modified independent Equipment used: None             General transfer comment: pt safe without instability  Ambulation/Gait Ambulation/Gait assistance: Min guard Ambulation Distance (Feet): 200 Feet Assistive device: Rolling walker (2 wheeled);None Gait Pattern/deviations: Step-through pattern Gait velocity: dec   General Gait Details: began  ambulating without RW, but steady but increased trunk flexion decreased pace, and SOB. Pt given walker, pt with increased cadence more fluid gait pattern with less taxing effort. Pt agrees that amb with RW was easier  Stairs            Wheelchair Mobility    Modified Rankin (Stroke Patients Only)       Balance Overall balance assessment: No apparent balance deficits (not formally assessed)                                           Pertinent Vitals/Pain Pain Assessment: No/denies pain    Home Living Family/patient expects to be discharged to:: Private residence Living Arrangements: Parent (pt primary caregiver to mother) Available Help at Discharge: Family;Available 24 hours/day Type of Home: House Home Access: Stairs to enter Entrance Stairs-Rails: Right Entrance Stairs-Number of Steps: 3 Home Layout: One level Home Equipment: Walker - 2 wheels;Cane - single point;Bedside commode Additional Comments: pt takes care of house but mother is mobile and can bath/dress her self.  mother's friend also stays there    Prior Function Level of Independence: Independent               Hand Dominance   Dominant Hand: Right    Extremity/Trunk Assessment   Upper Extremity Assessment: Generalized weakness           Lower Extremity Assessment: Generalized weakness      Cervical / Trunk Assessment: Normal  Communication  Communication: No difficulties  Cognition Arousal/Alertness: Awake/alert Behavior During Therapy: WFL for tasks assessed/performed Overall Cognitive Status: Within Functional Limits for tasks assessed                      General Comments      Exercises        Assessment/Plan    PT Assessment Patient needs continued PT services  PT Diagnosis Generalized weakness   PT Problem List Decreased strength;Decreased activity tolerance;Decreased balance;Decreased mobility  PT Treatment Interventions DME  instruction;Gait training;Stair training;Functional mobility training   PT Goals (Current goals can be found in the Care Plan section) Acute Rehab PT Goals Patient Stated Goal: to not be on O2 and go home tomorrow PT Goal Formulation: With patient Time For Goal Achievement: 06/27/15 Potential to Achieve Goals: Good    Frequency Min 3X/week   Barriers to discharge        Co-evaluation               End of Session Equipment Utilized During Treatment: Gait belt;Oxygen (2Lo2 via [place]) Activity Tolerance: Patient tolerated treatment well Patient left: in bed;with call bell/phone within reach Nurse Communication: Mobility status         Time: 1610-9604 PT Time Calculation (min) (ACUTE ONLY): 19 min   Charges:   PT Evaluation $Initial PT Evaluation Tier I: 1 Procedure     PT G CodesMarcene Brawn 06/20/2015, 4:44 PM   Lewis Shock, PT, DPT Pager #: [phone] Office #: [phone]

## 2015-06-21 ENCOUNTER — Inpatient Hospital Stay (HOSPITAL_COMMUNITY): Payer: Medicare Other

## 2015-06-21 LAB — COMPREHENSIVE METABOLIC PANEL
ALT: 20 U/L (ref 14–54)
AST: 27 U/L (ref 15–41)
Albumin: 2.8 g/dL — ABNORMAL LOW (ref 3.5–5.0)
Alkaline Phosphatase: 64 U/L (ref 38–126)
Anion gap: 8 (ref 5–15)
BUN: 15 mg/dL (ref 6–20)
CHLORIDE: 99 mmol/L — AB (ref 101–111)
CO2: 34 mmol/L — AB (ref 22–32)
CREATININE: 0.49 mg/dL (ref 0.44–1.00)
Calcium: 8.5 mg/dL — ABNORMAL LOW (ref 8.9–10.3)
GFR calc Af Amer: >60 mL/min (ref >60)
Glucose, Bld: 215 mg/dL — ABNORMAL HIGH (ref 65–99)
Potassium: 4.9 mmol/L (ref 3.5–5.1)
SODIUM: 141 mmol/L (ref 135–145)
Total Bilirubin: 0.6 mg/dL (ref 0.3–1.2)
Total Protein: 5.7 g/dL — ABNORMAL LOW (ref 6.5–8.1)

## 2015-06-21 LAB — GLUCOSE, CAPILLARY
GLUCOSE-CAPILLARY: 367 mg/dL — AB (ref 65–99)
Glucose-Capillary: 237 mg/dL — ABNORMAL HIGH (ref 65–99)
Glucose-Capillary: 228 mg/dL — ABNORMAL HIGH (ref 65–99)
Glucose-Capillary: 306 mg/dL — ABNORMAL HIGH (ref 65–99)

## 2015-06-21 LAB — CBC
HCT: 35.7 % — ABNORMAL LOW (ref 36.0–46.0)
Hemoglobin: 10.5 g/dL — ABNORMAL LOW (ref 12.0–15.0)
MCH: 23.9 pg — AB (ref 26.0–34.0)
MCHC: 29.4 g/dL — ABNORMAL LOW (ref 30.0–36.0)
MCV: 81.3 fL (ref 78.0–100.0)
PLATELETS: 381 10*3/uL (ref 150–400)
RBC: 4.39 MIL/uL (ref 3.87–5.11)
RDW: 16.6 % — AB (ref 11.5–15.5)
WBC: 7.8 10*3/uL (ref 4.0–10.5)

## 2015-06-21 LAB — D-DIMER, QUANTITATIVE: D-Dimer, Quant: 0.27 ug/mL-FEU (ref 0.00–0.48)

## 2015-06-21 MED ORDER — GLIPIZIDE 10 MG PO TABS
10.0000 mg | ORAL_TABLET | Freq: Two times a day (BID) | ORAL | Status: DC
  Administered 2015-06-21 – 2015-06-23 (×4): 10 mg via ORAL
  Filled 2015-06-21 (×7): qty 1

## 2015-06-21 MED ORDER — LIVING WELL WITH DIABETES BOOK
Freq: Once | Status: AC
  Administered 2015-06-21: 08:00:00
  Filled 2015-06-21: qty 1

## 2015-06-21 MED ORDER — FUROSEMIDE 10 MG/ML IJ SOLN
40.0000 mg | Freq: Once | INTRAMUSCULAR | Status: AC
  Administered 2015-06-21: 40 mg via INTRAVENOUS
  Filled 2015-06-21 (×2): qty 4

## 2015-06-21 MED ORDER — METHYLPREDNISOLONE SODIUM SUCC 125 MG IJ SOLR
60.0000 mg | Freq: Three times a day (TID) | INTRAMUSCULAR | Status: DC
  Administered 2015-06-21 – 2015-06-22 (×2): 60 mg via INTRAVENOUS
  Filled 2015-06-21 (×2): qty 2

## 2015-06-21 MED ORDER — TECHNETIUM TO 99M ALBUMIN AGGREGATED
4.0700 | Freq: Once | INTRAVENOUS | Status: AC | PRN
  Administered 2015-06-21: 4 via INTRAVENOUS

## 2015-06-21 MED ORDER — FUROSEMIDE 10 MG/ML IJ SOLN
INTRAMUSCULAR | Status: AC
  Administered 2015-06-21: 40 mg via INTRAVENOUS
  Filled 2015-06-21: qty 4

## 2015-06-21 MED ORDER — TECHNETIUM TC 99M DIETHYLENETRIAME-PENTAACETIC ACID: 31.2000 | Freq: Once | INTRAVENOUS | Status: DC | PRN

## 2015-06-21 NOTE — Progress Notes (Signed)
PT Cancellation Note  Patient Details Name: Yvonne Todd MRN: [id number] DOB: 25-Aug-1951   Cancelled Treatment:    Reason Eval/Treat Not Completed: Patient at procedure or test/unavailable (and just amb with nsg per NT, will attempt again as schedule permits)   [facility] HospitalWILLIAMS,Jury Caserta 06/21/2015, 11:01 AM

## 2015-06-21 NOTE — Progress Notes (Signed)
02 sats dropped to 79% while ambulating on room air. Patient presented with auditory wheezing and complained of "feeling hot". Patient's 02 sats returned to 97% on 2/L oxygen while sitting on side of the bed.  MD notified.

## 2015-06-21 NOTE — Progress Notes (Signed)
Inpatient Diabetes Program Recommendations  AACE/ADA: New Consensus Statement on Inpatient Glycemic Control (2015)  Target Ranges:  Prepandial:   less than 140 mg/dL      Peak postprandial:   less than 180 mg/dL (1-2 hours)      Critically ill patients:  140 - 180 mg/dL   Consult Note:  Spoke with pt about new diabetes diagnosis.  RNs to provide ongoing basic DM education at bedside with this patient and engage patient to actively check blood glucose. Have ordered educational booklet and DM videos. Patient reports that she has a PCP appointment on Dec 28th to follow up on her A1c after being placed on Glipizide and doing lifestyle modifications. Explained what an A1C is, basic pathophysiology of DM Type 2, basic home care, importance of checking CBGs and maintaining good CBG control to prevent long-term and short-term complications. Discussed impact of nutrition, exercise, stress, sickness, and medications on diabetes control. Discussed carbohydrates, carbohydrate goals per day and meal, along with portion sizes. Patient reports that she checks her mothers sugar at home and also cooks her meals for her. Patient verbalized understanding of information discussed and she states that she has no further questions at this time related to diabetes.   Glucose meter kit needed at time of discharge (order # [id number])  Thanks,  Tama Headings RN, MSN, [facility] Inpatient Diabetes Coordinator Team Pager [phone] (8a-5p)

## 2015-06-21 NOTE — Progress Notes (Signed)
[facility] PROGRESS NOTE  Yvonne Todd ZOX:[id number] DOB: 1951-07-30 DOA: 06/18/2015 PCP: Myrlene Broker, MD  Length of stay: 3   Assessment/Plan: Active Problems:   Tobacco use disorder   Impaired fasting blood sugar   Essential hypertension   COPD exacerbation (HCC)   Acute respiratory failure (HCC)    Acute hypoxemic respiratory failure secondary to COPD exacerbation xray consistent with emphysema Unable to wean oxygen. Patient found to be 79% with ambulation, 85% on 2 L. Will evaluate further with a d-dimer, if positive VQ scan to rule out PE CT of the chest without contrast [patient allergic to iodine], to rule out interstitial lung disease Patient will need outpatient PFTs and pulmonary consultation after discharge 2-D echo to further evaluate cardiac function   New onset diabetes type 2, patient's mother has diabetes Hemoglobin A1c 7.6, currently on Lantus and SSI Plan is to taper steroids as soon as we can Increase glipizide to 10 mg by mouth twice a -diabetic diet Glucometer teaching, diabetic coordinator consulted  Acute COPD  continue Levaquin 7 days, on day 3 Taper IV Solu-Medrol, continue Mucinex Continue Robitussin-DM cough syrup -outpatient PFTs  nicotine cessation counseling done  Tobacco abuse- -encourage cessation  DVT prophylaxsis Lovenox  Code Status:      Code Status Orders        Start     Ordered   06/18/15 2227  Full code   Continuous     06/18/15 2226     Family Communication: family updated about patient's clinical progress Disposition Plan: Patient not stable for discharge   Brief narrative: Smoker with PMHx of HTN/HLD. She developed URI symptoms Friday including cough and congestion. She then presented to the Urgent Care center after worsening SOB. She was wheezing and become very weak- falling out her bed on Friday. EMS was called to transport patient to ER from the urgent care and they gave  of  solumedrol and she was placed on 2L [place].  Patient did have some subjective fevers and some chills.   IN the ER, chest xray did not show PNA but did show flattening of her diaphragm consistent with emphysema. Labs showed an elevated glucose and low Na that corrected with blood sugar. Patient had some improvement of breathing and was asking for food and drink. Hospitalist were asked to admit for COPDexacerbation which has been very slow to improve despite aggressive treatment with antibiotics and IV steroids. Patient wants to be 79% on room air with ambulation. Does not use oxygen at home. Therefore further workup such VQ scan and CT chest ordered.   Consultants:  None  Procedures:  None  Antibiotics: Anti-infectives    Start     Dose/Rate Route Frequency Ordered Stop   06/19/15 1245  levofloxacin (LEVAQUIN) tablet 750 mg     750 mg Oral Daily 06/19/15 1236     06/18/15 1930  levofloxacin (LEVAQUIN) IVPB 750 mg  Status:  Discontinued     750 mg 100 mL/hr over 90 Minutes Intravenous Every 24 hours 06/18/15 1915 06/19/15 1236   06/18/15 1715  azithromycin (ZITHROMAX) 500 mg in dextrose 5 % 250 mL IVPB     500 mg 250 mL/hr over 60 Minutes Intravenous  Once 06/18/15 1713 06/18/15 1842        Continues to feel chest tightness, nonproductive cough, difficulty ambulating secondary to dyspnea   Objective: Filed Vitals:   06/21/15 0447 06/21/15 0830 06/21/15 0843 06/21/15 0905  BP: 140/54  103/53    Pulse: 67 72    Temp: 98.7 F (37.1 C) 97.4 F (36.3 C)    TempSrc:  Oral    Resp: 20 20    Height:      Weight:      SpO2: 90% 94% 98% 99%    Intake/Output Summary (Last 24 hours) at 06/21/15 1214 Last data filed at 06/21/15 0825  Gross per 24 hour  Intake 2042.5 ml  Output    925 ml  Net 1117.5 ml    Exam:  General: No acute respiratory distress Lungs: Mild by basilar wheezing Cardiovascular: Regular rate and rhythm without murmur gallop or rub normal S1 and  S2 Abdomen: Nontender, nondistended, soft, bowel sounds positive, no rebound, no ascites, no appreciable mass Extremities: No significant cyanosis, clubbing, or edema bilateral lower extremities   Data Review   Micro Results No results found for this or any previous visit (from the past 240 hour(s)).  Radiology Reports Dg Chest 2 View  06/21/2015  CLINICAL DATA:  Pt c/o SOB and wheezing today; productive cough onset a few days ago EXAM: CHEST  2 VIEW COMPARISON:  06/18/2015 FINDINGS: Heart size is normal. There is mild perihilar peribronchial thickening. No focal consolidations or pleural effusions are identified. No pulmonary edema. There is mild lower thoracic spondylosis. IMPRESSION: 1. Mild bronchitic changes. 2.  No focal acute pulmonary abnormality. Electronically Signed   By: Norva PavlovElizabeth  Apo M.D.   On: 06/21/2015 11:06   Dg Chest 2 View  06/18/2015  CLINICAL DATA:  Progressive shortness of breath.  Productive cough. EXAM: CHEST  2 VIEW COMPARISON:  06/23/2014 and 06/01/2014 FINDINGS: Heart size and pulmonary vascularity are normal. The lungs are clear. No acute osseous abnormality. The lungs are somewhat hyperinflated the diaphragm is flattened on the lateral view consistent with emphysema. IMPRESSION: No acute abnormality.  Emphysema. Electronically Signed   By: Francene BoyersJames  Maxwell M.D.   On: 06/18/2015 15:57     CBC  Recent Labs Lab 06/18/15 1623 06/19/15 0344 06/21/15 0520  WBC 6.0 4.2 7.8  HGB 11.2* 10.8* 10.5*  HCT 37.4 35.8* 35.7*  PLT 307 315 381  MCV 79.2 79.6 81.3  MCH 23.7* 24.0* 23.9*  MCHC 29.9* 30.2 29.4*  RDW 16.9* 16.6* 16.6*  LYMPHSABS 0.5*  --   --   MONOABS 0.1  --   --   EOSABS 0.0  --   --   BASOSABS 0.0  --   --     Chemistries   Recent Labs Lab 06/18/15 1623 06/19/15 0344 06/20/15 0352 06/21/15 0520  NA 133* 136 135 141  K 3.9 4.5 5.0 4.9  CL 96* 97* 95* 99*  CO2 26 30 32 34*  GLUCOSE 319* 303* 286* 215*  BUN 18 16 18 15   CREATININE 0.64  0.59 0.57 0.49  CALCIUM 8.9 9.0 8.6* 8.5*  AST 18  --   --  27  ALT 14  --   --  20  ALKPHOS 88  --   --  64  BILITOT 0.3  --   --  0.6   ------------------------------------------------------------------------------------------------------------------ estimated creatinine clearance is 72.3 mL/min (by C-G formula based on Cr of 0.49). ------------------------------------------------------------------------------------------------------------------  Recent Labs  06/18/15 1623  HGBA1C 7.6*   ------------------------------------------------------------------------------------------------------------------ No results for input(s): CHOL, HDL, LDLCALC, TRIG, CHOLHDL, LDLDIRECT in the last 72 hours. ------------------------------------------------------------------------------------------------------------------ No results for input(s): TSH, T4TOTAL, T3FREE, THYROIDAB in the last 72 hours.  Invalid input(s): FREET3 ------------------------------------------------------------------------------------------------------------------ No results for input(s): VITAMINB12, FOLATE,  FERRITIN, TIBC, IRON, RETICCTPCT in the last 72 hours.  Coagulation profile No results for input(s): INR, PROTIME in the last 168 hours.  No results for input(s): DDIMER in the last 72 hours.  Cardiac Enzymes  Recent Labs Lab 06/19/15 1505  TROPONINI <0.03   ------------------------------------------------------------------------------------------------------------------ Invalid input(s): POCBNP   CBG:  Recent Labs Lab 06/20/15 1149 06/20/15 1634 06/20/15 1959 06/21/15 0752 06/21/15 1140  GLUCAP 369* 245* 258* 237* 228*       Studies: Dg Chest 2 View  06/21/2015  CLINICAL DATA:  Pt c/o SOB and wheezing today; productive cough onset a few days ago EXAM: CHEST  2 VIEW COMPARISON:  06/18/2015 FINDINGS: Heart size is normal. There is mild perihilar peribronchial thickening. No focal consolidations or  pleural effusions are identified. No pulmonary edema. There is mild lower thoracic spondylosis. IMPRESSION: 1. Mild bronchitic changes. 2.  No focal acute pulmonary abnormality. Electronically Signed   By: Norva Pavlov M.D.   On: 06/21/2015 11:06      Lab Results  Component Value Date   HGBA1C 7.6* 06/18/2015   HGBA1C 6.7* 02/21/2015   HGBA1C 6.3* 03/19/2014   Lab Results  Component Value Date   LDLCALC 214* 02/21/2015   CREATININE 0.49 06/21/2015       Scheduled Meds: . amLODipine  10 mg Oral Daily  . diphenhydrAMINE  50 mg Oral QHS  . enoxaparin (LOVENOX) injection  40 mg Subcutaneous Q24H  . fluticasone  2 spray Each Nare BID  . glipiZIDE  10 mg Oral BID AC  . guaiFENesin  600 mg Oral BID  . insulin aspart  0-15 Units Subcutaneous TID WC  . insulin aspart  0-5 Units Subcutaneous QHS  . insulin glargine  20 Units Subcutaneous Daily  . levofloxacin  750 mg Oral Daily  . methylPREDNISolone (SOLU-MEDROL) injection  60 mg Intravenous Q8H  . simvastatin  20 mg Oral QHS  . sodium chloride  3 mL Intravenous Q12H   Continuous Infusions:    Active Problems:   Tobacco use disorder   Impaired fasting blood sugar   Essential hypertension   COPD exacerbation (HCC)   Acute respiratory failure (HCC)    Time spent: 45 minutes   [facility]  [facility] Pager [phone]. If 7PM-7AM, please contact night-coverage at [email or web address], password [facility] 06/21/2015, 12:14 PM  LOS: 3 days

## 2015-06-21 NOTE — Care Management Important Message (Signed)
Important Message  Patient Details  Name: Virgia LandLynda I Kipp MRN: [id number] Date of Birth: 02-21-1951   Medicare Important Message Given:  Yes-second notification given    Orson AloeMegan P Aaliya Maultsby 06/21/2015, 12:18 PM

## 2015-06-21 NOTE — Evaluation (Signed)
Occupational Therapy Evaluation Patient Details Name: Yvonne Todd MRN: [id number] DOB: Apr 07, 1951 Today's Date: 06/21/2015    History of Present Illness Pt is a 64 yo female Smoker with PMHx of HTN/HLD. She developed URI symptoms Friday including cough and congestion. She then presented to the Urgent Care center after worsening SOB. She was wheezing and become very weak- falling out her bed on Friday..   Clinical Impression   Pt is Mod I with ADLs and functional mobility. No further acute OT indicated at this time    Follow Up Recommendations  No OT follow up    Equipment Recommendations  None recommended by OT    Recommendations for Other Services       Precautions / Restrictions Precautions Precaution Comments: SOB with mobiltiy Restrictions Weight Bearing Restrictions: No      Mobility Bed Mobility Overal bed mobility: Modified Independent                Transfers Overall transfer level: Modified independent Equipment used: None             General transfer comment: pt safe without instability    Balance Overall balance assessment: No apparent balance deficits (not formally assessed)                                          ADL Overall ADL's : Needs assistance/impaired     Grooming: Modified independent;Wash/dry hands;Wash/dry face;Standing   Upper Body Bathing: Modified independent   Lower Body Bathing: Modified independent   Upper Body Dressing : Modified independent       Toilet Transfer: Modified Independent   Toileting- Clothing Manipulation and Hygiene: Modified independent   Tub/ Engineer, structuralhower Transfer: Modified independent   Functional mobility during ADLs: Modified independent       Vision  glasses   Perception Perception Perception Tested?: No   Praxis Praxis Praxis tested?: Not tested    Pertinent Vitals/Pain Pain Assessment: No/denies pain     Hand Dominance Right   Extremity/Trunk  Assessment Upper Extremity Assessment Upper Extremity Assessment: Generalized weakness       Cervical / Trunk Assessment Cervical / Trunk Assessment: Normal   Communication Communication Communication: No difficulties   Cognition Arousal/Alertness: Awake/alert Behavior During Therapy: WFL for tasks assessed/performed Overall Cognitive Status: Within Functional Limits for tasks assessed                     General Comments   pt very pleasant and cooperative                 Home Living Family/patient expects to be discharged to:: Private residence Living Arrangements: Parent Available Help at Discharge: Family;Available 24 hours/day Type of Home: House Home Access: Stairs to enter Entergy CorporationEntrance Stairs-Number of Steps: 3 Entrance Stairs-Rails: Right Home Layout: One level     Bathroom Shower/Tub: Chief Strategy OfficerTub/shower unit   Bathroom Toilet: Standard     Home Equipment: Environmental consultantWalker - 2 wheels;Cane - single point;Bedside commode   Additional Comments: pt takes care of house but mother is mobile and can bath/dress her self.  mother's friend also stays there      Prior Functioning/Environment Level of Independence: Independent             OT Diagnosis: Generalized weakness   OT Problem List: Decreased activity tolerance   OT Treatment/Interventions:      OT Goals(Current goals  can be found in the care plan section) Acute Rehab OT Goals Patient Stated Goal: to not be on O2 and go home  OT Goal Formulation: With patient  OT Frequency:     Barriers to D/C:  none                        End of Session    Activity Tolerance: Patient tolerated treatment well Patient left: in bed;with call bell/phone within reach   Time: 5366-4403 OT Time Calculation (min): 16 min Charges:  OT General Charges $OT Visit: 1 Procedure OT Evaluation $Initial OT Evaluation Tier I: 1 Procedure G-Codes:    Galen Manila 06/21/2015, 11:13 AM

## 2015-06-22 ENCOUNTER — Inpatient Hospital Stay (HOSPITAL_COMMUNITY): Payer: Medicare Other

## 2015-06-22 DIAGNOSIS — J441 Chronic obstructive pulmonary disease with (acute) exacerbation: Secondary | ICD-10-CM

## 2015-06-22 DIAGNOSIS — R0600 Dyspnea, unspecified: Secondary | ICD-10-CM

## 2015-06-22 DIAGNOSIS — F17200 Nicotine dependence, unspecified, uncomplicated: Secondary | ICD-10-CM

## 2015-06-22 DIAGNOSIS — I10 Essential (primary) hypertension: Secondary | ICD-10-CM

## 2015-06-22 LAB — CBC
HCT: 37.4 % (ref 36.0–46.0)
HEMOGLOBIN: 10.9 g/dL — AB (ref 12.0–15.0)
MCH: 23.5 pg — ABNORMAL LOW (ref 26.0–34.0)
MCHC: 29.1 g/dL — ABNORMAL LOW (ref 30.0–36.0)
MCV: 80.6 fL (ref 78.0–100.0)
Platelets: 331 10*3/uL (ref 150–400)
RBC: 4.64 MIL/uL (ref 3.87–5.11)
RDW: 16.5 % — ABNORMAL HIGH (ref 11.5–15.5)
WBC: 6.8 10*3/uL (ref 4.0–10.5)

## 2015-06-22 LAB — COMPREHENSIVE METABOLIC PANEL
ALK PHOS: 71 U/L (ref 38–126)
ALT: 19 U/L (ref 14–54)
AST: 15 U/L (ref 15–41)
Albumin: 3 g/dL — ABNORMAL LOW (ref 3.5–5.0)
Anion gap: 9 (ref 5–15)
BUN: 15 mg/dL (ref 6–20)
CALCIUM: 8.5 mg/dL — AB (ref 8.9–10.3)
CO2: 35 mmol/L — AB (ref 22–32)
CREATININE: 0.58 mg/dL (ref 0.44–1.00)
Chloride: 92 mmol/L — ABNORMAL LOW (ref 101–111)
GFR calc Af Amer: >60 mL/min (ref >60)
GFR calc non Af Amer: >60 mL/min (ref >60)
Glucose, Bld: 297 mg/dL — ABNORMAL HIGH (ref 65–99)
Potassium: 4.4 mmol/L (ref 3.5–5.1)
SODIUM: 136 mmol/L (ref 135–145)
Total Bilirubin: 0.1 mg/dL — ABNORMAL LOW (ref 0.3–1.2)
Total Protein: 5.6 g/dL — ABNORMAL LOW (ref 6.5–8.1)

## 2015-06-22 LAB — GLUCOSE, CAPILLARY
GLUCOSE-CAPILLARY: 241 mg/dL — AB (ref 65–99)
Glucose-Capillary: 203 mg/dL — ABNORMAL HIGH (ref 65–99)
Glucose-Capillary: 210 mg/dL — ABNORMAL HIGH (ref 65–99)
Glucose-Capillary: 310 mg/dL — ABNORMAL HIGH (ref 65–99)

## 2015-06-22 MED ORDER — METHYLPREDNISOLONE SODIUM SUCC 125 MG IJ SOLR
60.0000 mg | Freq: Two times a day (BID) | INTRAMUSCULAR | Status: DC
  Administered 2015-06-22 – 2015-06-23 (×2): 60 mg via INTRAVENOUS
  Filled 2015-06-22 (×2): qty 2

## 2015-06-22 NOTE — Progress Notes (Signed)
  Echocardiogram 2D Echocardiogram has been performed.  Yvonne Todd, Yvonne Todd 06/22/2015, 11:09 AM

## 2015-06-22 NOTE — Progress Notes (Signed)
[facility] PROGRESS NOTE  Yvonne Todd EAV:[id number] DOB: Dec 15, 1950 DOA: 06/18/2015 PCP: Myrlene Broker, MD  Length of stay: 4   Assessment/Plan:    Acute hypoxemic respiratory failure secondary to COPD exacerbation CT scan consistent with emphysema Has improved on IV steroids along with Levaquin, start tapering steroids, will be discharged on home oxygen as she qualifies for it. Counseled to quit smoking. Requires outpatient pulmonary follow-up as has her new suspicious lung nodule 6mm size.    New onset diabetes type 2, patient's mother has diabetes Hemoglobin A1c 7.6, currently on Lantus and SSI, on glipizide continue Diabetic teaching.   Tobacco abuse- -encourage cessation  DVT prophylaxsis Lovenox  Code Status:      Code Status Orders        Start     Ordered   06/18/15 2227  Full code   Continuous     06/18/15 2226     Family Communication: family updated about patient's clinical progress Disposition Plan: Patient not stable for discharge   Brief narrative: Smoker with PMHx of HTN/HLD. She developed URI symptoms Friday including cough and congestion. She then presented to the Urgent Care center after worsening SOB. She was wheezing and become very weak- falling out her bed on Friday. EMS was called to transport patient to ER from the urgent care and they gave 125mg  of solumedrol and she was placed on 2L [place].  Patient did have some subjective fevers and some chills.   IN the ER, chest xray did not show PNA but did show flattening of her diaphragm consistent with emphysema. Labs showed an elevated glucose and low Na that corrected with blood sugar. Patient had some improvement of breathing and was asking for food and drink. Hospitalist were asked to admit for COPDexacerbation which has been very slow to improve despite aggressive treatment with antibiotics and IV steroids. Patient wants to be 79% on room air with ambulation. Does not  use oxygen at home. Therefore further workup such VQ scan and CT chest ordered.   Consultants:  None  Procedures:  None  Antibiotics: Anti-infectives    Start     Dose/Rate Route Frequency Ordered Stop   06/19/15 1245  levofloxacin (LEVAQUIN) tablet 750 mg     750 mg Oral Daily 06/19/15 1236     06/18/15 1930  levofloxacin (LEVAQUIN) IVPB 750 mg  Status:  Discontinued     750 mg 100 mL/hr over 90 Minutes Intravenous Every 24 hours 06/18/15 1915 06/19/15 1236   06/18/15 1715  azithromycin (ZITHROMAX) 500 mg in dextrose 5 % 250 mL IVPB     500 mg 250 mL/hr over 60 Minutes Intravenous  Once 06/18/15 1713 06/18/15 1842      Subjective  Patient feels better, shortness of breath is improved but still requiring oxygen. No chest abdominal pain. No weakness   Objective: Filed Vitals:   06/21/15 2043 06/21/15 2334 06/22/15 0532 06/22/15 0812  BP: 145/54 149/45 166/70 149/69  Pulse: 60 63 107 60  Temp: 98.8 F (37.1 C)  97.8 F (36.6 C) 98.1 F (36.7 C)  TempSrc: Oral  Oral Oral  Resp: 16  18 17   Height:      Weight: 89.903 kg (198 lb 3.2 oz)     SpO2: 96%  97% 98%    Intake/Output Summary (Last 24 hours) at 06/22/15 1059 Last data filed at 06/22/15 1033  Gross per 24 hour  Intake    960 ml  Output  1300 ml  Net   -340 ml    Exam:  General: No acute respiratory distress Lungs: Mild by basilar wheezing Cardiovascular: Regular rate and rhythm without murmur gallop or rub normal S1 and S2 Abdomen: Nontender, nondistended, soft, bowel sounds positive, no rebound, no ascites, no appreciable mass Extremities: No significant cyanosis, clubbing, or edema bilateral lower extremities   Data Review   Micro Results No results found for this or any previous visit (from the past 240 hour(s)).  Radiology Reports Dg Chest 2 View  06/21/2015  CLINICAL DATA:  Pt c/o SOB and wheezing today; productive cough onset a few days ago EXAM: CHEST  2 VIEW COMPARISON:  06/18/2015  FINDINGS: Heart size is normal. There is mild perihilar peribronchial thickening. No focal consolidations or pleural effusions are identified. No pulmonary edema. There is mild lower thoracic spondylosis. IMPRESSION: 1. Mild bronchitic changes. 2.  No focal acute pulmonary abnormality. Electronically Signed   By: Norva PavlovElizabeth  Turvey M.D.   On: 06/21/2015 11:06   Dg Chest 2 View  06/18/2015  CLINICAL DATA:  Progressive shortness of breath.  Productive cough. EXAM: CHEST  2 VIEW COMPARISON:  06/23/2014 and 06/01/2014 FINDINGS: Heart size and pulmonary vascularity are normal. The lungs are clear. No acute osseous abnormality. The lungs are somewhat hyperinflated the diaphragm is flattened on the lateral view consistent with emphysema. IMPRESSION: No acute abnormality.  Emphysema. Electronically Signed   By: Francene BoyersJames  Maxwell M.D.   On: 06/18/2015 15:57   Ct Chest Wo Contrast  06/21/2015  CLINICAL DATA:  Respiratory difficulty, acute hypoxemic respiratory failure, hypertension, smoker EXAM: CT CHEST WITHOUT CONTRAST TECHNIQUE: Multidetector CT imaging of the chest was performed following the standard protocol without IV contrast. Sagittal and coronal MPR images reconstructed from axial data set. COMPARISON:  None; correlation chest radiograph 06/21/2015 FINDINGS: Scattered atherosclerotic calcifications aorta, coronary arteries and proximal great vessels. Upper normal sized precarinal lymph node 10 mm short axis image 19. No additional thoracic adenopathy. Visualized upper abdomen normal appearance for technique. Emphysematous changes subpleural nodule LEFT lower lobe 6 mm diameter image 40. Remaining lungs clear. No pulmonary infiltrate, pleural effusion, pneumothorax or additional mass/nodule. No acute osseous findings. IMPRESSION: Scattered emphysematous changes without acute infiltrate. 6 mm LEFT lower lobe nodule, recommendation below. Given risk factors for bronchogenic carcinoma, follow-up chest CT at 6 - 12  months is recommended. This recommendation follows the consensus statement: Guidelines for Management of Small Pulmonary Nodules Detected on CT Scans: A Statement from the Fleischner Society as published in Radiology 2005;237:395-400. Electronically Signed   By: Ulyses SouthwardMark  Boles M.D.   On: 06/21/2015 16:28   Nm Pulmonary Perf And Vent  06/21/2015  CLINICAL DATA:  Cough and congestion.  Spasms in the ribcage EXAM: NUCLEAR MEDICINE VENTILATION - PERFUSION LUNG SCAN TECHNIQUE: Ventilation images were obtained in multiple projections using inhaled aerosol Tc-2245m DTPA. Perfusion images were obtained in multiple projections after intravenous injection of Tc-6345m MAA. RADIOPHARMACEUTICALS:  31.2 Technetium-1645m DTPA aerosol inhalation and 4.07 Technetium-4445m MAA IV COMPARISON:  Chest x-ray and chest CT from today FINDINGS: Markedly heterogeneous ventilation correlating with patient's emphysema. There are large areas of peripheral hypoperfusion which have a segmental appearance, bilateral apical upper lobes and right middle lobe segment/s. These areas are matched on ventilation (in addition to other areas of ventilation defect) and there is no triple match. Scan best classified as intermediate category (20 - 79%) by PIOPED II. IMPRESSION: 1. Intermediate probability scan by PIOPED II due to bilateral large matched defects. 2. Emphysema  with heterogeneous aeration. Electronically Signed   By: Marnee Spring M.D.   On: 06/21/2015 15:26     CBC  Recent Labs Lab 06/18/15 1623 06/19/15 0344 06/21/15 0520 06/22/15 0329  WBC 6.0 4.2 7.8 6.8  HGB 11.2* 10.8* 10.5* 10.9*  HCT 37.4 35.8* 35.7* 37.4  PLT 307 315 381 331  MCV 79.2 79.6 81.3 80.6  MCH 23.7* 24.0* 23.9* 23.5*  MCHC 29.9* 30.2 29.4* 29.1*  RDW 16.9* 16.6* 16.6* 16.5*  LYMPHSABS 0.5*  --   --   --   MONOABS 0.1  --   --   --   EOSABS 0.0  --   --   --   BASOSABS 0.0  --   --   --     Chemistries   Recent Labs Lab 06/18/15 1623 06/19/15 0344  06/20/15 0352 06/21/15 0520 06/22/15 0329  NA 133* 136 135 141 136  K 3.9 4.5 5.0 4.9 4.4  CL 96* 97* 95* 99* 92*  CO2 26 30 32 34* 35*  GLUCOSE 319* 303* 286* 215* 297*  BUN CREATININE 0.64 0.59 0.57 0.49 0.58  CALCIUM 8.9 9.0 8.6* 8.5* 8.5*  AST 18  --   --  27 15  ALT 14  --   --  20 19  ALKPHOS 88  --   --  64 71  BILITOT 0.3  --   --  0.6 0.1*   ------------------------------------------------------------------------------------------------------------------ estimated creatinine clearance is 73 mL/min (by C-G formula based on Cr of 0.58). ------------------------------------------------------------------------------------------------------------------ No results for input(s): HGBA1C in the last 72 hours. ------------------------------------------------------------------------------------------------------------------ No results for input(s): CHOL, HDL, LDLCALC, TRIG, CHOLHDL, LDLDIRECT in the last 72 hours. ------------------------------------------------------------------------------------------------------------------ No results for input(s): TSH, T4TOTAL, T3FREE, THYROIDAB in the last 72 hours.  Invalid input(s): FREET3 ------------------------------------------------------------------------------------------------------------------ No results for input(s): VITAMINB12, FOLATE, FERRITIN, TIBC, IRON, RETICCTPCT in the last 72 hours.  Coagulation profile No results for input(s): INR, PROTIME in the last 168 hours.   Recent Labs  06/21/15 1552  DDIMER 0.27    Cardiac Enzymes  Recent Labs Lab 06/19/15 1505  TROPONINI <0.03   ------------------------------------------------------------------------------------------------------------------ Invalid input(s): POCBNP   CBG:  Recent Labs Lab 06/21/15 0752 06/21/15 1140 06/21/15 1651 06/21/15 2158 06/22/15 0735  GLUCAP 237* 228* 367* 306* 203*       Studies: Dg Chest 2 View  06/21/2015   CLINICAL DATA:  Pt c/o SOB and wheezing today; productive cough onset a few days ago EXAM: CHEST  2 VIEW COMPARISON:  06/18/2015 FINDINGS: Heart size is normal. There is mild perihilar peribronchial thickening. No focal consolidations or pleural effusions are identified. No pulmonary edema. There is mild lower thoracic spondylosis. IMPRESSION: 1. Mild bronchitic changes. 2.  No focal acute pulmonary abnormality. Electronically Signed   By: Norva Pavlov M.D.   On: 06/21/2015 11:06   Ct Chest Wo Contrast  06/21/2015  CLINICAL DATA:  Respiratory difficulty, acute hypoxemic respiratory failure, hypertension, smoker EXAM: CT CHEST WITHOUT CONTRAST TECHNIQUE: Multidetector CT imaging of the chest was performed following the standard protocol without IV contrast. Sagittal and coronal MPR images reconstructed from axial data set. COMPARISON:  None; correlation chest radiograph 06/21/2015 FINDINGS: Scattered atherosclerotic calcifications aorta, coronary arteries and proximal great vessels. Upper normal sized precarinal lymph node 10 mm short axis image 19. No additional thoracic adenopathy. Visualized upper abdomen normal appearance for technique. Emphysematous changes subpleural nodule LEFT lower lobe 6 mm diameter image 40. Remaining lungs clear. No pulmonary infiltrate, pleural  effusion, pneumothorax or additional mass/nodule. No acute osseous findings. IMPRESSION: Scattered emphysematous changes without acute infiltrate. 6 mm LEFT lower lobe nodule, recommendation below. Given risk factors for bronchogenic carcinoma, follow-up chest CT at 6 - 12 months is recommended. This recommendation follows the consensus statement: Guidelines for Management of Small Pulmonary Nodules Detected on CT Scans: A Statement from the Fleischner Society as published in Radiology 2005;237:395-400. Electronically Signed   By: Ulyses Southward M.D.   On: 06/21/2015 16:28   Nm Pulmonary Perf And Vent  06/21/2015  CLINICAL DATA:  Cough  and congestion.  Spasms in the ribcage EXAM: NUCLEAR MEDICINE VENTILATION - PERFUSION LUNG SCAN TECHNIQUE: Ventilation images were obtained in multiple projections using inhaled aerosol Tc-77m DTPA. Perfusion images were obtained in multiple projections after intravenous injection of Tc-86m MAA. RADIOPHARMACEUTICALS:  31.2 Technetium-32m DTPA aerosol inhalation and 4.07 Technetium-62m MAA IV COMPARISON:  Chest x-ray and chest CT from today FINDINGS: Markedly heterogeneous ventilation correlating with patient's emphysema. There are large areas of peripheral hypoperfusion which have a segmental appearance, bilateral apical upper lobes and right middle lobe segment/s. These areas are matched on ventilation (in addition to other areas of ventilation defect) and there is no triple match. Scan best classified as intermediate category (20 - 79%) by PIOPED II. IMPRESSION: 1. Intermediate probability scan by PIOPED II due to bilateral large matched defects. 2. Emphysema with heterogeneous aeration. Electronically Signed   By: Marnee Spring M.D.   On: 06/21/2015 15:26      Lab Results  Component Value Date   HGBA1C 7.6* 06/18/2015   HGBA1C 6.7* 02/21/2015   HGBA1C 6.3* 03/19/2014   Lab Results  Component Value Date   LDLCALC 214* 02/21/2015   CREATININE 0.58 06/22/2015       Scheduled Meds: . amLODipine  10 mg Oral Daily  . diphenhydrAMINE  50 mg Oral QHS  . enoxaparin (LOVENOX) injection  40 mg Subcutaneous Q24H  . fluticasone  2 spray Each Nare BID  . glipiZIDE  10 mg Oral BID AC  . guaiFENesin  600 mg Oral BID  . insulin aspart  0-15 Units Subcutaneous TID WC  . insulin aspart  0-5 Units Subcutaneous QHS  . insulin glargine  20 Units Subcutaneous Daily  . levofloxacin  750 mg Oral Daily  . methylPREDNISolone (SOLU-MEDROL) injection  60 mg Intravenous Q8H  . simvastatin  20 mg Oral QHS  . sodium chloride  3 mL Intravenous Q12H   Continuous Infusions:    Active Problems:   Tobacco use  disorder   Impaired fasting blood sugar   Essential hypertension   COPD exacerbation (HCC)   Acute respiratory failure (HCC)    Time spent: 45 minutes   [facility] K  [facility] Pager [phone]. If 7PM-7AM, please contact night-coverage at [email or web address], password [facility] 06/22/2015, 10:59 AM  LOS: 4 days

## 2015-06-22 NOTE — Plan of Care (Signed)
Problem: Phase I Progression Outcomes Goal: EF % per last Echo/documented,Core Reminder form on chart Outcome: Completed/Met Date Met:  06/22/15 EF 60-65%

## 2015-06-23 LAB — GLUCOSE, CAPILLARY
Glucose-Capillary: 227 mg/dL — ABNORMAL HIGH (ref 65–99)
Glucose-Capillary: 191 mg/dL — ABNORMAL HIGH (ref 65–99)

## 2015-06-23 MED ORDER — LEVOFLOXACIN 750 MG PO TABS: 750.0000 mg | ORAL_TABLET | Freq: Every day | ORAL | Status: DC

## 2015-06-23 MED ORDER — INSULIN ASPART 100 UNIT/ML ~~LOC~~ SOLN: SUBCUTANEOUS | Status: DC

## 2015-06-23 MED ORDER — FLUTICASONE-SALMETEROL 500-50 MCG/DOSE IN AEPB: 1.0000 | INHALATION_SPRAY | Freq: Two times a day (BID) | RESPIRATORY_TRACT | Status: DC

## 2015-06-23 MED ORDER — ALBUTEROL SULFATE (2.5 MG/3ML) 0.083% IN NEBU: 2.5000 mg | INHALATION_SOLUTION | RESPIRATORY_TRACT | Status: DC | PRN

## 2015-06-23 MED ORDER — FREESTYLE SYSTEM KIT: 1.0000 | PACK | Freq: Three times a day (TID) | Status: DC

## 2015-06-23 MED ORDER — GLIPIZIDE 10 MG PO TABS: 10.0000 mg | ORAL_TABLET | Freq: Two times a day (BID) | ORAL | Status: DC

## 2015-06-23 MED ORDER — PREDNISONE 5 MG PO TABS: ORAL_TABLET | ORAL | Status: DC

## 2015-06-23 MED ORDER — TIOTROPIUM BROMIDE MONOHYDRATE 18 MCG IN CAPS: 18.0000 ug | ORAL_CAPSULE | Freq: Every day | RESPIRATORY_TRACT | Status: DC

## 2015-06-23 MED ORDER — INSULIN STARTER KIT- SYRINGES (ENGLISH)
1.0000 | Freq: Once | Status: AC
  Administered 2015-06-23: 1
  Filled 2015-06-23: qty 1

## 2015-06-23 MED ORDER — INSULIN GLARGINE 100 UNIT/ML ~~LOC~~ SOLN: 20.0000 [IU] | Freq: Every day | SUBCUTANEOUS | Status: DC

## 2015-06-23 NOTE — Plan of Care (Signed)
Problem: Discharge Progression Outcomes Goal: Flu vaccine received if indicated Outcome: Not Applicable Date Met:  83/07/46 Received prior to admission

## 2015-06-23 NOTE — Progress Notes (Signed)
Dr. Thedore MinsSingh notified of saturation qualification completion.

## 2015-06-23 NOTE — Discharge Instructions (Signed)
Follow with Primary MD Myrlene BrokerElizabeth A Crawford, MD in 7 days   Get CBC, CMP, 2 view Chest X ray checked  by Primary MD next visit.    Activity: As tolerated with Full fall precautions use walker/cane & assistance as needed   Disposition Home     Diet: Heart Healthy  Low Carb  Accuchecks 4 times/day, Once in AM empty stomach and then before each meal. Log in all results and show them to your Prim.MD in 3 days. If any glucose reading is under 80 or above 300 call your Prim MD immidiately. Follow Low glucose instructions for glucose under 80 as instructed.   For Heart failure patients - Check your Weight same time everyday, if you gain over 2 pounds, or you develop in leg swelling, experience more shortness of breath or chest pain, call your Primary MD immediately. Follow Cardiac Low Salt Diet and 1.5 lit/day fluid restriction.   On your next visit with your primary care physician please Get Medicines reviewed and adjusted.   Please request your Prim.MD to go over all Hospital Tests and Procedure/Radiological results at the follow up, please get all Hospital records sent to your Prim MD by signing hospital release before you go home.   If you experience worsening of your admission symptoms, develop shortness of breath, life threatening emergency, suicidal or homicidal thoughts you must seek medical attention immediately by calling 911 or calling your MD immediately  if symptoms less severe.  You Must read complete instructions/literature along with all the possible adverse reactions/side effects for all the Medicines you take and that have been prescribed to you. Take any new Medicines after you have completely understood and accpet all the possible adverse reactions/side effects.   Do not drive, operating heavy machinery, perform activities at heights, swimming or participation in water activities or provide baby sitting services if your were admitted for syncope or siezures until you have  seen by Primary MD or a Neurologist and advised to do so again.  Do not drive when taking Pain medications.    Do not take more than prescribed Pain, Sleep and Anxiety Medications  Special Instructions: If you have smoked or chewed Tobacco  in the last 2 yrs please stop smoking, stop any regular Alcohol  and or any Recreational drug use.  Wear Seat belts while driving.   Please note  You were cared for by a hospitalist during your hospital stay. If you have any questions about your discharge medications or the care you received while you were in the hospital after you are discharged, you can call the unit and asked to speak with the hospitalist on call if the hospitalist that took care of you is not available. Once you are discharged, your primary care physician will handle any further medical issues. Please note that NO REFILLS for any discharge medications will be authorized once you are discharged, as it is imperative that you return to your primary care physician (or establish a relationship with a primary care physician if you do not have one) for your aftercare needs so that they can reassess your need for medications and monitor your lab values.
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
## 2015-06-23 NOTE — Progress Notes (Signed)
Patient return demonstration of reading SSI scale, drawing up insulin, and injecting insulin syringe into skin. Patient self administered Lantus & Novolog today while RN observed.  Patient stated she feels very comfortable with this.  Patient given handout on hypoglycemia & hyperglycemia.  Patient verbalized understanding of treatment. Discharge instructions and medications discussed with patient.  Prescriptions given to patient.  All questions answered.

## 2015-06-23 NOTE — Progress Notes (Signed)
SATURATION QUALIFICATIONS: (This note is used to comply with regulatory documentation for home oxygen)  Patient Saturations on Room Air at Rest = 90%  Patient Saturations on Room Air while Ambulating = 85%  Patient Saturations on 2 Liters of oxygen while Ambulating = 94%  Please briefly explain why patient needs home oxygen:

## 2015-06-23 NOTE — Care Management Note (Addendum)
Case Management Note  Patient Details  Name: Virgia LandLynda I Hileman MRN: [id number] Date of Birth: 10/03/1950  Subjective/Objective:   Presented with COPD exacerbation, newly diagnoses diabetic on insulin.                 Action/Plan:  CM reviewed record and spoke with patient regarding HH recommendations RN , PT, patient declined PT but, [facility] and [facility] for D[facility]. [facility]  Tiffany start of care 10/23.Denies any difficulty obtaining monthly medications Patient encouraged to attend follow up appointment, verbalized understanding teach back done. Patient going home on home oxygen 2L continuous, and home nebulizer [facility] [phone], verified demographics with patient. Patient will received a tank in room prior to  to be discharge.  No further CM needs identified Expected Discharge Date:  06/22/15               Expected Discharge Plan:  Home w Home Health Services  In-House Referral:     Discharge planning Services  CM Consult  Post Acute Care Choice:    Choice offered to:  Patient  DME Arranged:   Home oxygen, nebulizer machine DME Agency:   [facility]  HH Arranged:  RN HH Agency:  [facility]  Status of Service:  Completed, signed off  Medicare Important Message Given:  Yes-second notification given Date Medicare IM Given:    Medicare IM give by:    Date Additional Medicare IM Given:    Additional Medicare Important Message give by:     If discussed at Long Length of Stay Meetings, dates discussed:    Additional CommentsMichel Bickers:  Vidal Lampkins, RN 06/23/2015, 1:48 PM

## 2015-07-02 ENCOUNTER — Inpatient Hospital Stay: Payer: Medicare Other | Admitting: Adult Health

## 2015-07-02 ENCOUNTER — Other Ambulatory Visit (INDEPENDENT_AMBULATORY_CARE_PROVIDER_SITE_OTHER): Payer: Medicare Other

## 2015-07-02 ENCOUNTER — Encounter: Payer: Self-pay | Admitting: Internal Medicine

## 2015-07-02 ENCOUNTER — Ambulatory Visit (INDEPENDENT_AMBULATORY_CARE_PROVIDER_SITE_OTHER): Payer: Medicare Other | Admitting: Internal Medicine

## 2015-07-02 VITALS — BP 138/70 | HR 83 | Temp 98.1°F | Resp 16 | Ht 62.0 in | Wt 194.0 lb

## 2015-07-02 DIAGNOSIS — F17200 Nicotine dependence, unspecified, uncomplicated: Secondary | ICD-10-CM | POA: Diagnosis not present

## 2015-07-02 DIAGNOSIS — J449 Chronic obstructive pulmonary disease, unspecified: Secondary | ICD-10-CM

## 2015-07-02 DIAGNOSIS — E099 Drug or chemical induced diabetes mellitus without complications: Secondary | ICD-10-CM | POA: Diagnosis not present

## 2015-07-02 DIAGNOSIS — D649 Anemia, unspecified: Secondary | ICD-10-CM

## 2015-07-02 DIAGNOSIS — I10 Essential (primary) hypertension: Secondary | ICD-10-CM

## 2015-07-02 DIAGNOSIS — T380X5A Adverse effect of glucocorticoids and synthetic analogues, initial encounter: Secondary | ICD-10-CM

## 2015-07-02 LAB — CBC
HCT: 39 % (ref 36.0–46.0)
Hemoglobin: 12.2 g/dL (ref 12.0–15.0)
MCHC: 31.3 g/dL (ref 30.0–36.0)
MCV: 77 fl — AB (ref 78.0–100.0)
Platelets: 381 10*3/uL (ref 150.0–400.0)
RBC: 5.07 Mil/uL (ref 3.87–5.11)
RDW: 20.1 % — AB (ref 11.5–15.5)
WBC: 10.2 10*3/uL (ref 4.0–10.5)

## 2015-07-02 LAB — COMPREHENSIVE METABOLIC PANEL
ALT: 21 U/L (ref 0–35)
AST: 11 U/L (ref 0–37)
Albumin: 3.9 g/dL (ref 3.5–5.2)
Alkaline Phosphatase: 70 U/L (ref 39–117)
BILIRUBIN TOTAL: 0.3 mg/dL (ref 0.2–1.2)
BUN: 17 mg/dL (ref 6–23)
CHLORIDE: 97 meq/L (ref 96–112)
CO2: 30 mEq/L (ref 19–32)
CREATININE: 0.63 mg/dL (ref 0.40–1.20)
Calcium: 9.4 mg/dL (ref 8.4–10.5)
GFR: 100.69 mL/min (ref >60.00)
GLUCOSE: 402 mg/dL — AB (ref 70–99)
Potassium: 5.5 mEq/L — ABNORMAL HIGH (ref 3.5–5.1)
SODIUM: 135 meq/L (ref 135–145)
Total Protein: 6.7 g/dL (ref 6.0–8.3)

## 2015-07-02 NOTE — Progress Notes (Signed)
Pre visit review using our clinic review tool, if applicable. No additional management support is needed unless otherwise documented below in the visit note. 

## 2015-07-02 NOTE — Patient Instructions (Addendum)
Melatonin is something that you can try to help with your sleep. You can take 5 mg melatonin in the evening to help you to fall asleep easier and stay asleep better. The prednisone is likely hurting your sleep cycle and should go back to normal.   We will have you keep using the mealtime insulin for 1 week after getting off the prednisone. Keep taking the night time lantus for a while.   Come back in about 1-2 months to follow up on the sugars so we can adjust the regimen.   Smoking Cessation, Tips for Success If you are ready to quit smoking, congratulations! You have chosen to help yourself be healthier. Cigarettes bring nicotine, tar, carbon monoxide, and other irritants into your body. Your lungs, heart, and blood vessels will be able to work better without these poisons. There are many different ways to quit smoking. Nicotine gum, nicotine patches, a nicotine inhaler, or nicotine nasal spray can help with physical craving. Hypnosis, support groups, and medicines help break the habit of smoking. WHAT THINGS CAN I DO TO MAKE QUITTING EASIER?  Here are some tips to help you quit for good:  Pick a date when you will quit smoking completely. Tell all of your friends and family about your plan to quit on that date.  Do not try to slowly cut down on the number of cigarettes you are smoking. Pick a quit date and quit smoking completely starting on that day.  Throw away all cigarettes.   Clean and remove all ashtrays from your home, work, and car.  On a card, write down your reasons for quitting. Carry the card with you and read it when you get the urge to smoke.  Cleanse your body of nicotine. Drink enough water and fluids to keep your urine clear or pale yellow. Do this after quitting to flush the nicotine from your body.  Learn to predict your moods. Do not let a bad situation be your excuse to have a cigarette. Some situations in your life might tempt you into wanting a cigarette.  Never  have "just one" cigarette. It leads to wanting another and another. Remind yourself of your decision to quit.  Change habits associated with smoking. If you smoked while driving or when feeling stressed, try other activities to replace smoking. Stand up when drinking your coffee. Brush your teeth after eating. Sit in a different chair when you read the paper. Avoid alcohol while trying to quit, and try to drink fewer caffeinated beverages. Alcohol and caffeine may urge you to smoke.  Avoid foods and drinks that can trigger a desire to smoke, such as sugary or spicy foods and alcohol.  Ask people who smoke not to smoke around you.  Have something planned to do right after eating or having a cup of coffee. For example, plan to take a walk or exercise.  Try a relaxation exercise to calm you down and decrease your stress. Remember, you may be tense and nervous for the first 2 weeks after you quit, but this will pass.  Find new activities to keep your hands busy. Play with a pen, coin, or rubber band. Doodle or draw things on paper.  Brush your teeth right after eating. This will help cut down on the craving for the taste of tobacco after meals. You can also try mouthwash.   Use oral substitutes in place of cigarettes. Try using lemon drops, carrots, cinnamon sticks, or chewing gum. Keep them handy so they  are available when you have the urge to smoke.  When you have the urge to smoke, try deep breathing.  Designate your home as a nonsmoking area.  If you are a heavy smoker, ask your health care provider about a prescription for nicotine chewing gum. It can ease your withdrawal from nicotine.  Reward yourself. Set aside the cigarette money you save and buy yourself something nice.  Look for support from others. Join a support group or smoking cessation program. Ask someone at home or at work to help you with your plan to quit smoking.  Always ask yourself, "Do I need this cigarette or is this  just a reflex?" Tell yourself, "Today, I choose not to smoke," or "I do not want to smoke." You are reminding yourself of your decision to quit.  Do not replace cigarette smoking with electronic cigarettes (commonly called e-cigarettes). The safety of e-cigarettes is unknown, and some may contain harmful chemicals.  If you relapse, do not give up! Plan ahead and think about what you will do the next time you get the urge to smoke. HOW WILL I FEEL WHEN I QUIT SMOKING? You may have symptoms of withdrawal because your body is used to nicotine (the addictive substance in cigarettes). You may crave cigarettes, be irritable, feel very hungry, cough often, get headaches, or have difficulty concentrating. The withdrawal symptoms are only temporary. They are strongest when you first quit but will go away within 10-14 days. When withdrawal symptoms occur, stay in control. Think about your reasons for quitting. Remind yourself that these are signs that your body is healing and getting used to being without cigarettes. Remember that withdrawal symptoms are easier to treat than the major diseases that smoking can cause.  Even after the withdrawal is over, expect periodic urges to smoke. However, these cravings are generally short lived and will go away whether you smoke or not. Do not smoke! WHAT RESOURCES ARE AVAILABLE TO HELP ME QUIT SMOKING? Your health care provider can direct you to community resources or hospitals for support, which may include:  Group support.  Education.  Hypnosis.  Therapy.   This information is not intended to replace advice given to you by your health care provider. Make sure you discuss any questions you have with your health care provider.   Document Released: 05/16/2004 Document Revised: 09/08/2014 Document Reviewed: 02/03/2013 Elsevier Interactive Patient Education [employer]

## 2015-07-03 ENCOUNTER — Ambulatory Visit (INDEPENDENT_AMBULATORY_CARE_PROVIDER_SITE_OTHER): Payer: Medicare Other | Admitting: Pulmonary Disease

## 2015-07-03 ENCOUNTER — Encounter: Payer: Self-pay | Admitting: Pulmonary Disease

## 2015-07-03 VITALS — BP 152/66 | HR 80 | Ht 62.0 in | Wt 194.0 lb

## 2015-07-03 DIAGNOSIS — J449 Chronic obstructive pulmonary disease, unspecified: Secondary | ICD-10-CM

## 2015-07-03 NOTE — Assessment & Plan Note (Signed)
Has not quit and congratulated her. Given tips for success today at visit and reminded her about the harms of smoking.

## 2015-07-03 NOTE — Progress Notes (Signed)
Subjective:    Patient ID: [name], female    DOB: 1951/08/13, 64 y.o.   MRN: [id number]  HPI Follow-up evaluation after hospitalization for COPD, asthma  Mrs. Vicente is a 64 year old with past medical history as below. She was admitted from 06/18/15-06/23/15 with dyspnea, acute exacerbation of COPD, asthma. Her CT on this admission did not show any pneumonia but showed findings consistent with emphysema. She was treated with IV steroids and Levaquin and discharged home on a steroid taper. She was also started on Advair and Spiriva.  Since her discharge she states that her breathing has improved considerably and she is back to baseline. She still has some dyspnea on exertion. Denies any cough, sputum, fevers, chills. She had smoked one pack per day for 40 years but has quit since her admission this month. She also has seasonal allergies and is allergic to cats and ragweed. She lives in a home with 9 And tries to avoid them as much as she can..  Past Medical History  Diagnosis Date  . Allergy     Iodine, Penicillin, Tetracycline.  Patient reports reaction of hives, rash, no sob, no wheezing   . Arthritis     .Right knee, right hip  . Depression     Past year depression not sleeping after fiancee deceased 10-13-2012  . Hyperlipidemia     Dx 03/2014  . Hypertension     Dx 03/2014  . Primary localized osteoarthritis of right knee   . Pneumonia     hx of recently po antibiotics  . Diabetes ([place])     Current outpatient prescriptions:  .  acetaminophen (TYLENOL) 325 MG tablet, Take 2 tablets (650 mg total) by mouth every 6 (six) hours as needed for mild pain (or Fever >/= 101)., Disp: , Rfl:  .  albuterol (PROVENTIL) (2.5 MG/3ML) 0.083% nebulizer solution, Take 3 mLs (2.5 mg total) by nebulization every 2 (two) hours as needed for wheezing., Disp: 75 mL, Rfl: 12 .  amLODipine (NORVASC) 10 MG tablet, Take 1 tablet (10 mg total) by mouth daily., Disp: 90 tablet, Rfl: 3 .  aspirin 81 MG tablet,  Take 81 mg by mouth daily., Disp: , Rfl:  .  diphenhydrAMINE (BENADRYL) 25 MG tablet, Take 50 mg by mouth at bedtime., Disp: , Rfl:  .  fluticasone (FLONASE) 50 MCG/ACT nasal spray, Place 2 sprays into both nostrils 2 (two) times daily. (Patient taking differently: Place 2 sprays into both nostrils daily. ), Disp: 16 g, Rfl: 6 .  Fluticasone-Salmeterol (ADVAIR DISKUS) 500-50 MCG/DOSE AEPB, Inhale 1 puff into the lungs 2 (two) times daily. Can switch brand as needed, Disp: 60 each, Rfl: 0 .  glipiZIDE (GLUCOTROL) 10 MG tablet, Take 1 tablet (10 mg total) by mouth 2 (two) times daily before a meal., Disp: 60 tablet, Rfl: 0 .  glucose monitoring kit (FREESTYLE) monitoring kit, 1 each by Does not apply route 4 (four) times daily - after meals and at bedtime. 1 month Diabetic Testing Supplies for QAC-QHS accuchecks.Any brand OK. Diagnosis E11.65, Disp: 1 each, Rfl: 1 .  insulin glargine (LANTUS) 100 UNIT/ML injection, Inject 0.2 mLs (20 Units total) into the skin at bedtime. Dispense insulin pen if approved, if not dispense as needed syringes and needles for 1 month supply. Can switch to Levemir. Diagnosis E 11.65., Disp: 10 mL, Rfl: 0 .  insulin lispro (HUMALOG) 100 UNIT/ML injection, Inject into the skin 4 (four) times daily -  before meals and at bedtime. Sliding  scale as directed, Disp: , Rfl:  .  Melatonin 5 MG TABS, Take 1 tablet by mouth at bedtime., Disp: , Rfl:  .  predniSONE (DELTASONE) 5 MG tablet, Label  & dispense according to the schedule below. 10 Pills PO for 3 days then, 8 Pills PO for 3 days, 6 Pills PO for 3 days, 4 Pills PO for 3 days, 2 Pills PO for 3 days, 1 Pills PO for 3 days, 1/2 Pill  PO for 3 days then STOP. Total 95 pills., Disp: 95 tablet, Rfl: 0 .  simvastatin (ZOCOR) 20 MG tablet, Take 1 tablet (20 mg total) by mouth at bedtime., Disp: 30 tablet, Rfl: 5 .  tiotropium (SPIRIVA HANDIHALER) 18 MCG inhalation capsule, Place 1 capsule (18 mcg total) into inhaler and inhale daily. Can  switch brand as needed (Patient taking differently: Place 18 mcg into inhaler and inhale daily as needed. Can switch brand as needed), Disp: 30 capsule, Rfl: 12 .  nitroGLYCERIN (NITROSTAT) 0.4 MG SL tablet, Place 1 tablet (0.4 mg total) under the tongue every 5 (five) minutes as needed for chest pain. (Patient not taking: Reported on 07/02/2015), Disp: 30 tablet, Rfl: 5  Review of Systems Has dyspnea on exertion. Denies any cough, sputum production, fevers, chills. Denies any chest pain, palpitations. Denies any nausea, vomiting, diarrhea, constipation. Her other review of systems are negative.    Objective:   Physical Exam  Blood pressure 152/66, pulse 80, height _0  (1.575 m), weight 194 lb (87.998 kg), SpO2 95 %. Gen.: No apparent distress Neuro: No gross focal deficits. Neck: No JVD, lymphadenopathy, thyromegaly. RS: Clear, no wheeze or crackles. CVS: S1-S2 heard, no murmurs rubs gallops  Abdomen: Soft, positive bowel sounds. Extremities: No edema.    Assessment & Plan:   #1 Acute exacerbation of COPD, asthma. She is doing well after her discharge. She is still on the prednisone but will complete the taper later this week. She will continue her inhalers including Advair and Spiriva as prescribed. I will order pulmonary function tests with a bronchodilator response for assessment of her lung function. She will return to clinic in 6 months..  #2 lung nodule. She has a 6 mm left lower lobe lung nodules will need a follow-up CT scan in 6 months.  Marshell Garfinkel MD [facility] Pager [phone] If no answer or after 3pm call: [phone] 07/03/2015, 5:10 PM

## 2015-07-03 NOTE — Patient Instructions (Signed)
Follow up in 6 months with PFT

## 2015-07-03 NOTE — Assessment & Plan Note (Signed)
Has had some borderline sugars for some time but the steroid courses have sent her into diabetes. She is taking lantus 20 units at night time which seems to be appropriate. Will continue her sliding scale for while she is on the prednisone and for 1 week afterwards. Then she will stop the novolog and continue lantus. Will see her back in about 3-4 weeks. She is also taking glipizide 10 mg BID without low sugars. May need adjustment at follow up. Talked to her about weight and steroids and how this affects her sugars. Talked to her about the need for exercise.

## 2015-07-03 NOTE — Assessment & Plan Note (Signed)
BP doing well on amlodipine. Now that she does have diabetes would add ACE-I next if needed.

## 2015-07-03 NOTE — Progress Notes (Signed)
   Subjective:    Patient ID: Yvonne Todd, female    DOB: 1951/04/22, 64 y.o.   MRN: [id number]  HPI The patient is a 64 YO female coming in for hospital follow up (in for COPD exacerbation, given high dose steroids). She has had worsening of her diabetes with her recent multiple steroid tapers and her high dose steroids in the hospital. She is now on insulin lantus and novolog. She is using sliding scale insulin. Morning sugars are about 140 most days, no low or high morning sugars. She is using sliding scale but still having some high pre-meal sugars. Is on a steroid taper and on relatively high doses still. The prednisone is also affecting her sleep and she is not able to sleep well. Denies fevers or chills. Denies SOB, still mild cough. Has not been smoking since leaving the hospital and will try to stay away but all of her friends are smokers which has held her back in the past. Not using oxygen at all (sent home with it).   [facility] reviewed and updated.   Review of Systems  Constitutional: Negative for fever, chills, activity change and appetite change.  HENT: Negative.   Eyes: Negative.   Respiratory: Positive for cough. Negative for chest tightness and shortness of breath.   Cardiovascular: Negative for chest pain, palpitations and leg swelling.  Gastrointestinal: Negative for nausea, vomiting, abdominal pain, diarrhea, constipation and abdominal distention.  Musculoskeletal: Negative for back pain and neck pain.  Skin: Negative.   Neurological: Negative for dizziness, weakness, light-headedness and headaches.  Psychiatric/Behavioral: Negative.       Objective:   Physical Exam  Constitutional: She is oriented to person, place, and time. She appears well-developed and well-nourished. No distress.  HENT:  Head: Normocephalic and atraumatic.  Eyes: EOM are normal.  Neck: Normal range of motion.  Cardiovascular: Normal rate and regular rhythm.   Pulmonary/Chest:  Effort normal. No respiratory distress. She has no wheezes.  Abdominal: Soft. Bowel sounds are normal.  Musculoskeletal: She exhibits no edema.  Neurological: She is alert and oriented to person, place, and time.  Skin: Skin is warm and dry.  Psychiatric: She has a normal mood and affect.  Vitals reviewed.  Filed Vitals:   07/02/15 1044  BP: 138/70  Pulse: 83  Temp: 98.1 F (36.7 C)  TempSrc: Oral  Resp: 16  Height: 5\' 2"  (1.575 m)  Weight: 194 lb (87.998 kg)  SpO2: 93%      Assessment & Plan:

## 2015-07-03 NOTE — Assessment & Plan Note (Signed)
Improved since leaving the hospital. She is still using the nebulizer at times. Not on oxygen at all now. Using advair and flonase. Has stopped smoking and congratulated her on that. This is the best thing she could do for her lungs. Talked to her about strategies to help her stay quit.

## 2015-07-04 ENCOUNTER — Telehealth: Payer: Self-pay | Admitting: Internal Medicine

## 2015-07-04 ENCOUNTER — Other Ambulatory Visit: Payer: Self-pay | Admitting: Geriatric Medicine

## 2015-07-04 MED ORDER — GLUCOSE BLOOD VI STRP: ORAL_STRIP | Status: DC

## 2015-07-04 MED ORDER — LANCETS MISC: Status: DC

## 2015-07-04 NOTE — Telephone Encounter (Signed)
Patient is needing strips, lancets and needles for the Acucheck AVIV [facility] [address]. She is out of the strips.

## 2015-07-04 NOTE — Telephone Encounter (Signed)
Sent to pharmacy 

## 2015-07-11 ENCOUNTER — Telehealth: Payer: Self-pay | Admitting: Internal Medicine

## 2015-07-11 ENCOUNTER — Other Ambulatory Visit: Payer: Self-pay | Admitting: Geriatric Medicine

## 2015-07-11 MED ORDER — INSULIN PEN NEEDLE 31G X 8 MM MISC: Status: DC

## 2015-07-11 NOTE — Telephone Encounter (Signed)
Sent needles to pharmacy

## 2015-07-11 NOTE — Telephone Encounter (Signed)
Verified pharmacy is [facility] on [address]- Patient requests that we send in script for the needles for humalog and lantus injections. BD .[id number]

## 2015-07-17 ENCOUNTER — Telehealth: Payer: Self-pay | Admitting: Pulmonary Disease

## 2015-07-17 DIAGNOSIS — J449 Chronic obstructive pulmonary disease, unspecified: Secondary | ICD-10-CM

## 2015-07-17 NOTE — Telephone Encounter (Signed)
mandy returning call.Caren GriffinsStanley A Dalton

## 2015-07-17 NOTE — Telephone Encounter (Signed)
Spoke with [facility]andy. When pt was in the hospital she was discharged with 24/7 oxygen. The pt now wants this picked up. [facility] would like to do an ONO just make sure that the pt does not need it at night before it's discontinued.  Dr. Isaiah SergeMannam are you okay with this? Thanks.

## 2015-07-17 NOTE — Telephone Encounter (Signed)
Spoke with [facility]. Advised of PM's response. Order has been placed for ONO. Nothing further was needed.

## 2015-07-17 NOTE — Telephone Encounter (Signed)
lmtcb X1 for Mandy with [facility] 

## 2015-07-17 NOTE — Telephone Encounter (Signed)
Yes. OK with this

## 2015-07-30 ENCOUNTER — Encounter: Payer: Self-pay | Admitting: Internal Medicine

## 2015-07-30 ENCOUNTER — Ambulatory Visit (INDEPENDENT_AMBULATORY_CARE_PROVIDER_SITE_OTHER): Payer: Medicare Other | Admitting: Internal Medicine

## 2015-07-30 VITALS — BP 158/80 | HR 92 | Temp 98.7°F | Resp 20 | Ht 62.0 in | Wt 199.1 lb

## 2015-07-30 DIAGNOSIS — E099 Drug or chemical induced diabetes mellitus without complications: Secondary | ICD-10-CM

## 2015-07-30 DIAGNOSIS — I10 Essential (primary) hypertension: Secondary | ICD-10-CM | POA: Diagnosis not present

## 2015-07-30 DIAGNOSIS — T380X5A Adverse effect of glucocorticoids and synthetic analogues, initial encounter: Secondary | ICD-10-CM

## 2015-07-30 DIAGNOSIS — F17200 Nicotine dependence, unspecified, uncomplicated: Secondary | ICD-10-CM | POA: Diagnosis not present

## 2015-07-30 MED ORDER — GLIPIZIDE 10 MG PO TABS: 10.0000 mg | ORAL_TABLET | Freq: Two times a day (BID) | ORAL | Status: DC

## 2015-07-30 NOTE — Patient Instructions (Signed)
We will have you stop taking the mealtime insulin, keep taking the 20 units at night time of the lantus. We have sent in the refills of the glipizide for you to start taking again.   We are checking the blood work today and will call you with any changes.   If your cough and breathing do not get better call us this week so we can get you on treatment before it gets worse.

## 2015-07-30 NOTE — Progress Notes (Signed)
Pre visit review using our clinic review tool, if applicable. No additional management support is needed unless otherwise documented below in the visit note. 

## 2015-07-31 ENCOUNTER — Telehealth: Payer: Self-pay | Admitting: Pulmonary Disease

## 2015-07-31 ENCOUNTER — Encounter: Payer: Self-pay | Admitting: Internal Medicine

## 2015-07-31 DIAGNOSIS — G4734 Idiopathic sleep related nonobstructive alveolar hypoventilation: Secondary | ICD-10-CM

## 2015-07-31 NOTE — Telephone Encounter (Signed)
ONO received and placed in Dr. Isaiah SergeMannam high priority folder.  Please advise thanks

## 2015-07-31 NOTE — Assessment & Plan Note (Signed)
Will stop her humalog and continue lantus 20 units at night time for now. She has unfortunately needed many courses of steroids recently for her breathing which has caused her diabetes. She will also maintain her glipizide which was refilled at the visit. Checking HgA1c today and adjust as needed.

## 2015-07-31 NOTE — Progress Notes (Signed)
   Subjective:    Patient ID: Yvonne Todd, female    DOB: 1951/05/23, 64 y.o.   MRN: [id number]  HPI The patient is a 64 YO female coming in for follow up of her diabetes. She has been using insulin at mealtimes and night time since being on such large doses of steroids for her breathing. She has now been off steroids for 3 weeks and her sugars are calming down. Generally not needing mealtime insulin at least 50% of the time (mostly more). She is taking lantus 20 units at night time. Out of her glipizide for several days but had been taking that regularly. No low sugars. No high sugars >250. No complications or new numbness or tingling.   Review of Systems  Constitutional: Negative for fever, chills, activity change and appetite change.  Respiratory: Positive for cough. Negative for chest tightness and shortness of breath.   Cardiovascular: Negative for chest pain, palpitations and leg swelling.  Gastrointestinal: Negative for nausea, vomiting, abdominal pain, diarrhea, constipation and abdominal distention.  Musculoskeletal: Negative for back pain and neck pain.  Skin: Negative.   Neurological: Negative for dizziness, weakness, light-headedness, numbness and headaches.  Psychiatric/Behavioral: Negative.       Objective:   Physical Exam  Constitutional: She is oriented to person, place, and time. She appears well-developed and well-nourished. No distress.  HENT:  Head: Normocephalic and atraumatic.  Eyes: EOM are normal.  Neck: Normal range of motion.  Cardiovascular: Normal rate and regular rhythm.   Pulmonary/Chest: Effort normal. No respiratory distress. She has no wheezes.  Abdominal: Soft. Bowel sounds are normal.  Musculoskeletal: She exhibits no edema.  Neurological: She is alert and oriented to person, place, and time.  Skin: Skin is warm and dry.  Psychiatric: She has a normal mood and affect.  Vitals reviewed.  Filed Vitals:   07/30/15 1327  BP: 158/80  Pulse: 92    Temp: 98.7 F (37.1 C)  TempSrc: Oral  Resp: 20  Height: 5\' 2"  (1.575 m)  Weight: 199 lb 1.9 oz (90.32 kg)  SpO2: 90%      Assessment & Plan:

## 2015-07-31 NOTE — Assessment & Plan Note (Signed)
BP normal today on her amlodipine and will continue to monitor.

## 2015-07-31 NOTE — Assessment & Plan Note (Signed)
Still not smoking and congratulated her and encouraged her to stay smoke-free.

## 2015-08-02 NOTE — Telephone Encounter (Signed)
Spoke with Shanda BumpsJessica and ONO has been placed in PM's red to do folder Dr. Isaiah SergeMannam please advise

## 2015-08-02 NOTE — Telephone Encounter (Signed)
ONO shows significant dests suggestive of OSA. She will need to continue on ovenight oxygen. She will also need a sleep study. Please order a split night study.

## 2015-08-02 NOTE — Telephone Encounter (Signed)
LMTCB x1 w/ family member 

## 2015-08-03 NOTE — Telephone Encounter (Signed)
LM x 2 w/ family member

## 2015-08-06 NOTE — Telephone Encounter (Signed)
Called pt and line rings busy. Tried x 3 and still busy. WCB

## 2015-08-06 NOTE — Telephone Encounter (Signed)
Attempted to call patient but received busy signal. Was unable to LVM

## 2015-08-06 NOTE — Telephone Encounter (Signed)
Patient returned call [phone] returned call and states she really needs [facility] to come and pick up oxygen.

## 2015-08-06 NOTE — Telephone Encounter (Signed)
Attempted to contact patient, phone rang busy.  Will call back. 

## 2015-08-07 NOTE — Telephone Encounter (Signed)
LM with family member- states that the patient will be back in around 2pm this afternoon and she will have her call us back Will await call.

## 2015-08-08 NOTE — Telephone Encounter (Signed)
Spoke with pt and notified of ONO results  She verbalized understanding and will start using her o2 again with sleep  [facility]

## 2015-08-13 DIAGNOSIS — Z720 Tobacco use: Secondary | ICD-10-CM | POA: Diagnosis not present

## 2015-08-13 DIAGNOSIS — E119 Type 2 diabetes mellitus without complications: Secondary | ICD-10-CM | POA: Diagnosis not present

## 2015-08-13 DIAGNOSIS — I10 Essential (primary) hypertension: Secondary | ICD-10-CM | POA: Diagnosis not present

## 2015-08-13 DIAGNOSIS — J441 Chronic obstructive pulmonary disease with (acute) exacerbation: Secondary | ICD-10-CM | POA: Diagnosis not present

## 2015-08-24 ENCOUNTER — Ambulatory Visit: Payer: Medicare Other | Admitting: Internal Medicine

## 2015-08-30 ENCOUNTER — Other Ambulatory Visit: Payer: Self-pay | Admitting: Internal Medicine

## 2015-09-03 ENCOUNTER — Ambulatory Visit (HOSPITAL_BASED_OUTPATIENT_CLINIC_OR_DEPARTMENT_OTHER): Payer: Medicare Other | Attending: Pulmonary Disease

## 2015-09-03 ENCOUNTER — Other Ambulatory Visit: Payer: Self-pay | Admitting: Internal Medicine

## 2015-09-03 VITALS — Ht 62.5 in | Wt 175.0 lb

## 2015-09-03 DIAGNOSIS — G4734 Idiopathic sleep related nonobstructive alveolar hypoventilation: Secondary | ICD-10-CM | POA: Diagnosis not present

## 2015-09-03 DIAGNOSIS — I493 Ventricular premature depolarization: Secondary | ICD-10-CM | POA: Diagnosis not present

## 2015-09-03 DIAGNOSIS — J449 Chronic obstructive pulmonary disease, unspecified: Secondary | ICD-10-CM | POA: Insufficient documentation

## 2015-09-03 DIAGNOSIS — R0683 Snoring: Secondary | ICD-10-CM | POA: Insufficient documentation

## 2015-09-04 DIAGNOSIS — G4733 Obstructive sleep apnea (adult) (pediatric): Secondary | ICD-10-CM | POA: Diagnosis not present

## 2015-09-04 NOTE — Progress Notes (Unsigned)
Patient Name: Yvonne Todd, Aneeka Study Date: 09/03/2015 Gender: Female D.O.B: [birth year] Age (years): [age] Referring Provider: Chilton GreathousePRAVEEN MANNAM Height (inches): 63 Interpreting Physician: Cyril Mourningakesh Alva MD, ABSM Weight (lbs): 175 RPSGT: Melburn PopperWillard, Susan BMI: 31 MRN: [id number] Neck Size: 16.50   CLINICAL INFORMATION Sleep Study Type: NPSG Indication for sleep study: COPD Epworth Sleepiness Score:   SLEEP STUDY TECHNIQUE As per the AASM Manual for the Scoring of Sleep and Associated Events v2.3 (April 2016) with a hypopnea requiring 4% desaturations. The channels recorded and monitored were frontal, central and occipital EEG, electrooculogram (EOG), submentalis EMG (chin), nasal and oral airflow, thoracic and abdominal wall motion, anterior tibialis EMG, snore microphone, electrocardiogram, and pulse oximetry.   MEDICATIONS Patient's medications include: SIMVASTATIN, GLIPIZIDE, MELATONIN, BENADRYL. Medications self-administered by patient during sleep study : No sleep medicine administered.   SLEEP ARCHITECTURE The study was initiated at 9:36:59 PM and ended at 4:40:26 AM. Sleep onset time was 16.5 minutes and the sleep efficiency was 69.8%. The total sleep time was 295.5 minutes. Stage REM latency was 334.5 minutes. The patient spent 11.17% of the night in stage N1 sleep, 81.89% in stage N2 sleep, 0.68% in stage N3 and 6.26% in REM. Alpha intrusion was absent. Supine sleep was 10.97%.   RESPIRATORY PARAMETERS The overall apnea/hypopnea index (AHI) was 4.1 per hour. There were 13 total apneas, including 13 obstructive, 0 central and 0 mixed apneas. There were 7 hypopneas and 26 RERAs With RDI of 9/per hour The AHI during Stage REM sleep was 51.9 per hour. AHI while supine was 29.6 per hour. The mean oxygen saturation was 92.89%. The minimum SpO2 during sleep was 81.00%.2 L of oxygen was applied around 1 AM with improvement in saturations Loud snoring was noted during this  study.   CARDIAC DATA The 2 lead EKG demonstrated atrial fibrillation. The mean heart rate was 59.56 beats per minute. Other EKG findings include: PVCs.   LEG MOVEMENT DATA The total PLMS were 41 with a resulting PLMS index of 8.33. Associated arousal with leg movement index was 0.6 .   IMPRESSIONS - No significant obstructive sleep apnea occurred during this study (AHI = 4.1/h). - No significant central sleep apnea occurred during this study (CAI = 0.0/h). - Moderate oxygen desaturation was noted during this study (Min O2 = 81.00%). This was corrected by 2 L of oxygen. This is likely related to underlying pulmonary disease - The patient snored with Loud snoring volume. - EKG findings include PVCs. - Mild periodic limb movements of sleep occurred during the study. No significant associated arousals.   DIAGNOSIS - Nocturnal Hypoxemia (327.26 [G47.36 ICD-10]) - Upper airway resistance syndrome, does not meet criteria for CPAP intervention   RECOMMENDATIONS - Positional therapy avoiding supine position during sleep. - Avoid alcohol, sedatives and other CNS depressants that may worsen sleep apnea and disrupt normal sleep architecture. - Sleep hygiene should be reviewed to assess factors that may improve sleep quality. - Weight management and regular exercise should be initiated or continued if appropriate. -2 L of oxygen during sleep    Cyril Mourningakesh Alva MD. FCCP. [facility]

## 2015-09-05 ENCOUNTER — Telehealth: Payer: Self-pay | Admitting: Internal Medicine

## 2015-09-05 NOTE — Telephone Encounter (Signed)
Patient called to advise that the direction for glucose blood test strip [[id number]][[id number]]  Needs to be fixed to show " once per day" instead of "as directed". insruance will not cover it when we state "as directed". Please submit a new script that shows once per day

## 2015-09-08 DIAGNOSIS — G4734 Idiopathic sleep related nonobstructive alveolar hypoventilation: Secondary | ICD-10-CM

## 2015-09-24 ENCOUNTER — Telehealth: Payer: Self-pay | Admitting: Pulmonary Disease

## 2015-09-24 NOTE — Telephone Encounter (Signed)
Spoke with pt. She is requesting her sleep study results. These are in Epic.  PM - please advise. Thanks.

## 2015-09-25 NOTE — Telephone Encounter (Signed)
lmtcb x1 for pt. 

## 2015-09-25 NOTE — Telephone Encounter (Signed)
Sleep study does not show any sleep apnea but confirms desaturations. She has to continue her nocturnal O2. No further intervention needed.

## 2015-09-26 NOTE — Telephone Encounter (Signed)
Attempted to call pt. Line was busy. Will try back. 

## 2015-09-27 NOTE — Telephone Encounter (Signed)
Left message for patient to call back  

## 2015-09-27 NOTE — Telephone Encounter (Signed)
Results have been explained to patient, pt expressed understanding. Nothing further needed.  

## 2015-09-27 NOTE — Telephone Encounter (Signed)
Patient Returned call  [phone]

## 2016-02-12 ENCOUNTER — Other Ambulatory Visit: Payer: Self-pay | Admitting: *Deleted

## 2016-02-12 DIAGNOSIS — I10 Essential (primary) hypertension: Secondary | ICD-10-CM

## 2016-02-12 MED ORDER — AMLODIPINE BESYLATE 10 MG PO TABS: 10.0000 mg | ORAL_TABLET | Freq: Every day | ORAL | Status: DC

## 2016-03-11 ENCOUNTER — Other Ambulatory Visit: Payer: Self-pay | Admitting: *Deleted

## 2016-03-11 MED ORDER — SIMVASTATIN 20 MG PO TABS: ORAL_TABLET | ORAL | Status: DC

## 2016-05-06 ENCOUNTER — Other Ambulatory Visit: Payer: Self-pay | Admitting: Internal Medicine

## 2016-05-06 DIAGNOSIS — Z1231 Encounter for screening mammogram for malignant neoplasm of breast: Secondary | ICD-10-CM

## 2016-05-23 ENCOUNTER — Encounter (HOSPITAL_COMMUNITY): Payer: Self-pay | Admitting: Emergency Medicine

## 2016-05-23 ENCOUNTER — Inpatient Hospital Stay (HOSPITAL_COMMUNITY)
Admission: EM | Admit: 2016-05-23 | Discharge: 2016-05-31 | DRG: 190 | Disposition: A | Discharge status: Home / Self Care | Payer: Medicare Other | Attending: Internal Medicine | Admitting: Internal Medicine

## 2016-05-23 ENCOUNTER — Emergency Department (HOSPITAL_COMMUNITY): Payer: Medicare Other

## 2016-05-23 DIAGNOSIS — Z7982 Long term (current) use of aspirin: Secondary | ICD-10-CM | POA: Diagnosis not present

## 2016-05-23 DIAGNOSIS — T380X5A Adverse effect of glucocorticoids and synthetic analogues, initial encounter: Secondary | ICD-10-CM

## 2016-05-23 DIAGNOSIS — Z96651 Presence of right artificial knee joint: Secondary | ICD-10-CM | POA: Diagnosis present

## 2016-05-23 DIAGNOSIS — E1165 Type 2 diabetes mellitus with hyperglycemia: Secondary | ICD-10-CM | POA: Diagnosis present

## 2016-05-23 DIAGNOSIS — Z833 Family history of diabetes mellitus: Secondary | ICD-10-CM | POA: Diagnosis not present

## 2016-05-23 DIAGNOSIS — Z794 Long term (current) use of insulin: Secondary | ICD-10-CM

## 2016-05-23 DIAGNOSIS — Z23 Encounter for immunization: Secondary | ICD-10-CM

## 2016-05-23 DIAGNOSIS — I10 Essential (primary) hypertension: Secondary | ICD-10-CM | POA: Diagnosis not present

## 2016-05-23 DIAGNOSIS — J441 Chronic obstructive pulmonary disease with (acute) exacerbation: Secondary | ICD-10-CM | POA: Diagnosis present

## 2016-05-23 DIAGNOSIS — Z881 Allergy status to other antibiotic agents status: Secondary | ICD-10-CM | POA: Diagnosis not present

## 2016-05-23 DIAGNOSIS — Z79899 Other long term (current) drug therapy: Secondary | ICD-10-CM | POA: Diagnosis not present

## 2016-05-23 DIAGNOSIS — E785 Hyperlipidemia, unspecified: Secondary | ICD-10-CM

## 2016-05-23 DIAGNOSIS — Z825 Family history of asthma and other chronic lower respiratory diseases: Secondary | ICD-10-CM

## 2016-05-23 DIAGNOSIS — E088 Diabetes mellitus due to underlying condition with unspecified complications: Secondary | ICD-10-CM | POA: Diagnosis not present

## 2016-05-23 DIAGNOSIS — E1169 Type 2 diabetes mellitus with other specified complication: Secondary | ICD-10-CM

## 2016-05-23 DIAGNOSIS — J9601 Acute respiratory failure with hypoxia: Secondary | ICD-10-CM | POA: Diagnosis present

## 2016-05-23 DIAGNOSIS — J440 Chronic obstructive pulmonary disease with acute lower respiratory infection: Principal | ICD-10-CM | POA: Diagnosis present

## 2016-05-23 DIAGNOSIS — Z888 Allergy status to other drugs, medicaments and biological substances status: Secondary | ICD-10-CM

## 2016-05-23 DIAGNOSIS — I5031 Acute diastolic (congestive) heart failure: Secondary | ICD-10-CM | POA: Diagnosis present

## 2016-05-23 DIAGNOSIS — R0600 Dyspnea, unspecified: Secondary | ICD-10-CM

## 2016-05-23 DIAGNOSIS — E099 Drug or chemical induced diabetes mellitus without complications: Secondary | ICD-10-CM

## 2016-05-23 DIAGNOSIS — Z880 Allergy status to penicillin: Secondary | ICD-10-CM

## 2016-05-23 DIAGNOSIS — I110 Hypertensive heart disease with heart failure: Secondary | ICD-10-CM | POA: Diagnosis present

## 2016-05-23 DIAGNOSIS — J189 Pneumonia, unspecified organism: Secondary | ICD-10-CM | POA: Diagnosis present

## 2016-05-23 DIAGNOSIS — Z6838 Body mass index (BMI) 38.0-38.9, adult: Secondary | ICD-10-CM | POA: Diagnosis not present

## 2016-05-23 DIAGNOSIS — E0969 Drug or chemical induced diabetes mellitus with other specified complication: Secondary | ICD-10-CM | POA: Diagnosis not present

## 2016-05-23 DIAGNOSIS — F329 Major depressive disorder, single episode, unspecified: Secondary | ICD-10-CM | POA: Diagnosis present

## 2016-05-23 DIAGNOSIS — Z87891 Personal history of nicotine dependence: Secondary | ICD-10-CM | POA: Diagnosis not present

## 2016-05-23 DIAGNOSIS — Z8249 Family history of ischemic heart disease and other diseases of the circulatory system: Secondary | ICD-10-CM | POA: Diagnosis not present

## 2016-05-23 DIAGNOSIS — E0800 Diabetes mellitus due to underlying condition with hyperosmolarity without nonketotic hyperglycemic-hyperosmolar coma (NKHHC): Secondary | ICD-10-CM | POA: Diagnosis not present

## 2016-05-23 HISTORY — DX: Unspecified asthma, uncomplicated: J45.909

## 2016-05-23 LAB — COMPREHENSIVE METABOLIC PANEL
ALK PHOS: 65 U/L (ref 38–126)
ALT: 22 U/L (ref 14–54)
AST: 20 U/L (ref 15–41)
Albumin: 4.3 g/dL (ref 3.5–5.0)
Anion gap: 8 (ref 5–15)
BUN: 10 mg/dL (ref 6–20)
CALCIUM: 9 mg/dL (ref 8.9–10.3)
CO2: 36 mmol/L — AB (ref 22–32)
CREATININE: 0.55 mg/dL (ref 0.44–1.00)
Chloride: 99 mmol/L — ABNORMAL LOW (ref 101–111)
Glucose, Bld: 142 mg/dL — ABNORMAL HIGH (ref 65–99)
Potassium: 3.6 mmol/L (ref 3.5–5.1)
SODIUM: 143 mmol/L (ref 135–145)
Total Bilirubin: 0.5 mg/dL (ref 0.3–1.2)
Total Protein: 7.6 g/dL (ref 6.5–8.1)

## 2016-05-23 LAB — EXPECTORATED SPUTUM ASSESSMENT W GRAM STAIN, RFLX TO RESP C

## 2016-05-23 LAB — CBC WITH DIFFERENTIAL/PLATELET
Basophils Absolute: 0 10*3/uL (ref 0.0–0.1)
Basophils Relative: 0 %
Eosinophils Absolute: 0.1 10*3/uL (ref 0.0–0.7)
Eosinophils Relative: 2 %
HCT: 39.5 % (ref 36.0–46.0)
HEMOGLOBIN: 12.7 g/dL (ref 12.0–15.0)
LYMPHS ABS: 2 10*3/uL (ref 0.7–4.0)
LYMPHS PCT: 35 %
MCH: 28.1 pg (ref 26.0–34.0)
MCHC: 32.2 g/dL (ref 30.0–36.0)
MCV: 87.4 fL (ref 78.0–100.0)
Monocytes Absolute: 0.4 10*3/uL (ref 0.1–1.0)
Monocytes Relative: 7 %
NEUTROS ABS: 3.1 10*3/uL (ref 1.7–7.7)
NEUTROS PCT: 56 %
Platelets: 224 10*3/uL (ref 150–400)
RBC: 4.52 MIL/uL (ref 3.87–5.11)
RDW: 16.7 % — ABNORMAL HIGH (ref 11.5–15.5)
WBC: 5.6 10*3/uL (ref 4.0–10.5)

## 2016-05-23 LAB — EXPECTORATED SPUTUM ASSESSMENT W REFEX TO RESP CULTURE

## 2016-05-23 LAB — LIPASE, BLOOD: Lipase: 20 U/L (ref 11–51)

## 2016-05-23 LAB — GLUCOSE, CAPILLARY: GLUCOSE-CAPILLARY: 377 mg/dL — AB (ref 65–99)

## 2016-05-23 LAB — I-STAT CG4 LACTIC ACID, ED: LACTIC ACID, VENOUS: 1.08 mmol/L (ref 0.5–1.9)

## 2016-05-23 MED ORDER — INSULIN ASPART 100 UNIT/ML ~~LOC~~ SOLN
0.0000 [IU] | Freq: Every day | SUBCUTANEOUS | Status: DC
  Administered 2016-05-23: 5 [IU] via SUBCUTANEOUS
  Administered 2016-05-24: 3 [IU] via SUBCUTANEOUS
  Administered 2016-05-25 – 2016-05-27 (×2): 2 [IU] via SUBCUTANEOUS
  Administered 2016-05-28: 4 [IU] via SUBCUTANEOUS
  Administered 2016-05-29: 5 [IU] via SUBCUTANEOUS
  Administered 2016-05-30: 3 [IU] via SUBCUTANEOUS

## 2016-05-23 MED ORDER — ALBUTEROL (5 MG/ML) CONTINUOUS INHALATION SOLN
10.0000 mg/h | INHALATION_SOLUTION | Freq: Once | RESPIRATORY_TRACT | Status: AC
  Administered 2016-05-23: 10 mg/h via RESPIRATORY_TRACT
  Filled 2016-05-23: qty 20

## 2016-05-23 MED ORDER — SODIUM CHLORIDE 0.9% FLUSH
3.0000 mL | INTRAVENOUS | Status: DC | PRN
  Administered 2016-05-29: 3 mL via INTRAVENOUS
  Filled 2016-05-23: qty 3

## 2016-05-23 MED ORDER — ACETAMINOPHEN 650 MG RE SUPP: 650.0000 mg | Freq: Four times a day (QID) | RECTAL | Status: DC | PRN

## 2016-05-23 MED ORDER — DEXTROSE 5 % IV SOLN
1.0000 g | INTRAVENOUS | Status: AC
  Administered 2016-05-24 – 2016-05-27 (×4): 1 g via INTRAVENOUS
  Filled 2016-05-23 (×4): qty 10

## 2016-05-23 MED ORDER — DEXTROSE 5 % IV SOLN
500.0000 mg | INTRAVENOUS | Status: DC
  Administered 2016-05-24: 500 mg via INTRAVENOUS
  Filled 2016-05-23: qty 500

## 2016-05-23 MED ORDER — ENOXAPARIN SODIUM 40 MG/0.4ML ~~LOC~~ SOLN
40.0000 mg | SUBCUTANEOUS | Status: DC
  Administered 2016-05-23: 40 mg via SUBCUTANEOUS
  Filled 2016-05-23: qty 0.4

## 2016-05-23 MED ORDER — INSULIN GLARGINE 100 UNIT/ML SOLOSTAR PEN: 20.0000 [IU] | PEN_INJECTOR | Freq: Every day | SUBCUTANEOUS | Status: DC

## 2016-05-23 MED ORDER — IPRATROPIUM-ALBUTEROL 0.5-2.5 (3) MG/3ML IN SOLN
3.0000 mL | RESPIRATORY_TRACT | Status: DC
  Administered 2016-05-23 – 2016-05-24 (×2): 3 mL via RESPIRATORY_TRACT
  Filled 2016-05-23 (×2): qty 3

## 2016-05-23 MED ORDER — METHYLPREDNISOLONE SODIUM SUCC 125 MG IJ SOLR
125.0000 mg | Freq: Once | INTRAMUSCULAR | Status: AC
  Administered 2016-05-23: 125 mg via INTRAVENOUS
  Filled 2016-05-23: qty 2

## 2016-05-23 MED ORDER — ASPIRIN EC 81 MG PO TBEC
81.0000 mg | DELAYED_RELEASE_TABLET | Freq: Every day | ORAL | Status: DC
Start: 2016-05-24 — End: 2016-05-31
  Administered 2016-05-24 – 2016-05-31 (×8): 81 mg via ORAL
  Filled 2016-05-23 (×8): qty 1

## 2016-05-23 MED ORDER — DEXTROSE 5 % IV SOLN
500.0000 mg | Freq: Once | INTRAVENOUS | Status: AC
  Administered 2016-05-23: 500 mg via INTRAVENOUS
  Filled 2016-05-23: qty 500

## 2016-05-23 MED ORDER — AMLODIPINE BESYLATE 10 MG PO TABS
10.0000 mg | ORAL_TABLET | Freq: Every day | ORAL | Status: DC
  Administered 2016-05-24 – 2016-05-31 (×8): 10 mg via ORAL
  Filled 2016-05-23 (×8): qty 1

## 2016-05-23 MED ORDER — SODIUM CHLORIDE 0.9 % IV BOLUS (SEPSIS)
1000.0000 mL | Freq: Once | INTRAVENOUS | Status: AC
Start: 2016-05-23 — End: 2016-05-23
  Administered 2016-05-23: 1000 mL via INTRAVENOUS

## 2016-05-23 MED ORDER — SODIUM CHLORIDE 0.9% FLUSH
3.0000 mL | Freq: Two times a day (BID) | INTRAVENOUS | Status: DC
  Administered 2016-05-23 – 2016-05-30 (×15): 3 mL via INTRAVENOUS

## 2016-05-23 MED ORDER — ALBUTEROL SULFATE (2.5 MG/3ML) 0.083% IN NEBU: 2.5000 mg | INHALATION_SOLUTION | RESPIRATORY_TRACT | Status: DC | PRN

## 2016-05-23 MED ORDER — MAGNESIUM SULFATE 2 GM/50ML IV SOLN
2.0000 g | Freq: Once | INTRAVENOUS | Status: AC
  Administered 2016-05-23: 2 g via INTRAVENOUS
  Filled 2016-05-23: qty 50

## 2016-05-23 MED ORDER — MELATONIN 5 MG PO TABS: 1.0000 | ORAL_TABLET | Freq: Every day | ORAL | Status: DC

## 2016-05-23 MED ORDER — PREDNISONE 20 MG PO TABS
40.0000 mg | ORAL_TABLET | Freq: Every day | ORAL | Status: DC
  Administered 2016-05-24 – 2016-05-28 (×5): 40 mg via ORAL
  Filled 2016-05-23 (×5): qty 2

## 2016-05-23 MED ORDER — IPRATROPIUM-ALBUTEROL 0.5-2.5 (3) MG/3ML IN SOLN: 3.0000 mL | Freq: Four times a day (QID) | RESPIRATORY_TRACT | Status: DC

## 2016-05-23 MED ORDER — MAGNESIUM SULFATE 50 % IJ SOLN: 2.0000 g | Freq: Once | INTRAMUSCULAR | Status: DC

## 2016-05-23 MED ORDER — FLUTICASONE PROPIONATE 50 MCG/ACT NA SUSP
2.0000 | Freq: Every day | NASAL | Status: DC
  Administered 2016-05-24 – 2016-05-31 (×8): 2 via NASAL
  Filled 2016-05-23: qty 16

## 2016-05-23 MED ORDER — ALBUTEROL SULFATE (2.5 MG/3ML) 0.083% IN NEBU: 5.0000 mg | INHALATION_SOLUTION | Freq: Once | RESPIRATORY_TRACT | Status: DC

## 2016-05-23 MED ORDER — ACETAMINOPHEN 325 MG PO TABS
650.0000 mg | ORAL_TABLET | Freq: Four times a day (QID) | ORAL | Status: DC | PRN
Start: 2016-05-23 — End: 2016-05-31

## 2016-05-23 MED ORDER — DEXTROSE 5 % IV SOLN
1.0000 g | Freq: Once | INTRAVENOUS | Status: AC
  Administered 2016-05-23: 1 g via INTRAVENOUS
  Filled 2016-05-23: qty 10

## 2016-05-23 MED ORDER — SIMVASTATIN 20 MG PO TABS
20.0000 mg | ORAL_TABLET | Freq: Every day | ORAL | Status: DC
  Administered 2016-05-23 – 2016-05-30 (×8): 20 mg via ORAL
  Filled 2016-05-23 (×8): qty 1

## 2016-05-23 MED ORDER — SODIUM CHLORIDE 0.9 % IV SOLN: 250.0000 mL | INTRAVENOUS | Status: DC | PRN

## 2016-05-23 MED ORDER — IPRATROPIUM BROMIDE 0.02 % IN SOLN
1.0000 mg | Freq: Once | RESPIRATORY_TRACT | Status: AC
  Administered 2016-05-23: 1 mg via RESPIRATORY_TRACT
  Filled 2016-05-23: qty 5

## 2016-05-23 MED ORDER — ALBUTEROL SULFATE (2.5 MG/3ML) 0.083% IN NEBU
5.0000 mg | INHALATION_SOLUTION | Freq: Once | RESPIRATORY_TRACT | Status: AC
  Administered 2016-05-23: 5 mg via RESPIRATORY_TRACT
  Filled 2016-05-23: qty 6

## 2016-05-23 MED ORDER — INSULIN GLARGINE 100 UNIT/ML ~~LOC~~ SOLN
20.0000 [IU] | Freq: Every day | SUBCUTANEOUS | Status: DC
  Administered 2016-05-23 – 2016-05-24 (×2): 20 [IU] via SUBCUTANEOUS
  Filled 2016-05-23 (×2): qty 0.2

## 2016-05-23 MED ORDER — INSULIN ASPART 100 UNIT/ML ~~LOC~~ SOLN
0.0000 [IU] | Freq: Three times a day (TID) | SUBCUTANEOUS | Status: DC
  Administered 2016-05-24: 4 [IU] via SUBCUTANEOUS
  Administered 2016-05-24 (×2): 7 [IU] via SUBCUTANEOUS
  Administered 2016-05-25: 11 [IU] via SUBCUTANEOUS
  Administered 2016-05-26: 4 [IU] via SUBCUTANEOUS
  Administered 2016-05-26: 15 [IU] via SUBCUTANEOUS
  Administered 2016-05-27: 7 [IU] via SUBCUTANEOUS
  Administered 2016-05-27: 20 [IU] via SUBCUTANEOUS
  Administered 2016-05-27: 3 [IU] via SUBCUTANEOUS
  Administered 2016-05-28: 15 [IU] via SUBCUTANEOUS
  Administered 2016-05-28: 7 [IU] via SUBCUTANEOUS
  Administered 2016-05-28: 3 [IU] via SUBCUTANEOUS
  Administered 2016-05-29 (×3): 11 [IU] via SUBCUTANEOUS
  Administered 2016-05-30: 20 [IU] via SUBCUTANEOUS
  Administered 2016-05-30 (×2): 7 [IU] via SUBCUTANEOUS
  Administered 2016-05-31: 3 [IU] via SUBCUTANEOUS
  Administered 2016-05-31: 7 [IU] via SUBCUTANEOUS

## 2016-05-23 MED ORDER — IPRATROPIUM-ALBUTEROL 0.5-2.5 (3) MG/3ML IN SOLN: 3.0000 mL | RESPIRATORY_TRACT | Status: DC | PRN
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
## 2016-05-23 NOTE — ED Notes (Signed)
MD at bedside. 

## 2016-05-23 NOTE — ED Triage Notes (Addendum)
Pt c/o SOB for past few days. Pt sating 77% on RA, 87% on 2L, 90% on 3L Pt states she has felt congested and had trouble breathing. Pt coughing clear light yellow mucus. Pt reports only using oxygen at home while sleeping.
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
## 2016-05-23 NOTE — Progress Notes (Signed)
PHARMACIST - PHYSICIAN ORDER COMMUNICATION  CONCERNING: P&T Medication Policy on Herbal Medications  DESCRIPTION:  This patient's order for:  Melatonin  has been noted.  This product(s) is classified as an "herbal" or natural product. Due to a lack of definitive safety studies or FDA approval, nonstandard manufacturing practices, plus the potential risk of unknown drug-drug interactions while on inpatient medications, the Pharmacy and Therapeutics Committee does not permit the use of "herbal" or natural products of this type within [facility].   ACTION TAKEN: The pharmacy department is unable to verify this order at this time and your patient has been informed of this safety policy. Please reevaluate patient's clinical condition at discharge and address if the herbal or natural product(s) should be resumed at that time.  Dorna LeitzAnh Glendy Barsanti, PharmD, BCPS 05/23/2016 6:16 PM

## 2016-05-23 NOTE — ED Notes (Signed)
Bed: [id number] Expected date: 05/23/16 Expected time:  Means of arrival:  Comments: TRI 6

## 2016-05-23 NOTE — Care Management Note (Signed)
Case Management Note  Patient Details  Name: Yvonne Todd MRN: [id number] Date of Birth: [birth year]  Subjective/Objective:         Cm consult Pt states she has trouble affording test strips. Please provide available resources. Pt states she has an accu chek test strip  Also with issues with cost spiriva and lantus        Pt appreciative of resource info offerred  Voiced understanding  Also recommended pt use store brand glucometers at [employer] or [employer] No Rx needed Pt pleased    Action/Plan: Educated on discount glucometers and value test strips and how to use her pharmacy benefit on for blue medicare to get better rates for spiriva and lantus  Discussed use of customer service number and pharmacy numbers Informed ED RN pt had to use rest room and wanted to eat   Expected Discharge Date:   (unknown)               Expected Discharge Plan:     In-House Referral:     Discharge planning Services     Post Acute Care Choice:    Choice offered to:     DME Arranged:    DME Agency:     HH Arranged:    HH Agency:     Status of Service:     If discussed at [employer] Meetings, dates discussed:    Additional Comments:  Ophelia ShoulderGibbs, Yvonne Louise, RN 05/23/2016, 5:25 PM

## 2016-05-23 NOTE — ED Notes (Signed)
15:08 PT CAN GO TO FLOOR.

## 2016-05-24 DIAGNOSIS — E0969 Drug or chemical induced diabetes mellitus with other specified complication: Secondary | ICD-10-CM

## 2016-05-24 LAB — STREP PNEUMONIAE URINARY ANTIGEN: Strep Pneumo Urinary Antigen: NEGATIVE

## 2016-05-24 LAB — BASIC METABOLIC PANEL
ANION GAP: 9 (ref 5–15)
BUN: 11 mg/dL (ref 6–20)
CHLORIDE: 102 mmol/L (ref 101–111)
CO2: 30 mmol/L (ref 22–32)
Calcium: 9.2 mg/dL (ref 8.9–10.3)
Creatinine, Ser: 0.56 mg/dL (ref 0.44–1.00)
GFR calc non Af Amer: >60 mL/min (ref >60)
Glucose, Bld: 225 mg/dL — ABNORMAL HIGH (ref 65–99)
POTASSIUM: 3.8 mmol/L (ref 3.5–5.1)
Sodium: 141 mmol/L (ref 135–145)

## 2016-05-24 LAB — CBC
HEMATOCRIT: 39.4 % (ref 36.0–46.0)
HEMOGLOBIN: 12.5 g/dL (ref 12.0–15.0)
MCH: 28.2 pg (ref 26.0–34.0)
MCHC: 31.7 g/dL (ref 30.0–36.0)
MCV: 88.9 fL (ref 78.0–100.0)
Platelets: 268 10*3/uL (ref 150–400)
RBC: 4.43 MIL/uL (ref 3.87–5.11)
RDW: 16.7 % — ABNORMAL HIGH (ref 11.5–15.5)
WBC: 6.4 10*3/uL (ref 4.0–10.5)

## 2016-05-24 LAB — GLUCOSE, CAPILLARY
GLUCOSE-CAPILLARY: 217 mg/dL — AB (ref 65–99)
GLUCOSE-CAPILLARY: 222 mg/dL — AB (ref 65–99)
GLUCOSE-CAPILLARY: 248 mg/dL — AB (ref 65–99)
GLUCOSE-CAPILLARY: 273 mg/dL — AB (ref 65–99)
Glucose-Capillary: 160 mg/dL — ABNORMAL HIGH (ref 65–99)

## 2016-05-24 LAB — HEMOGLOBIN A1C
HEMOGLOBIN A1C: 6.5 % — AB (ref 4.8–5.6)
Mean Plasma Glucose: 140 mg/dL

## 2016-05-24 MED ORDER — ENOXAPARIN SODIUM 60 MG/0.6ML ~~LOC~~ SOLN
50.0000 mg | SUBCUTANEOUS | Status: DC
  Administered 2016-05-24 – 2016-05-30 (×7): 50 mg via SUBCUTANEOUS
  Filled 2016-05-24 (×7): qty 0.6

## 2016-05-24 MED ORDER — DIPHENHYDRAMINE HCL 50 MG/ML IJ SOLN
25.0000 mg | Freq: Once | INTRAMUSCULAR | Status: AC
  Administered 2016-05-24: 25 mg via INTRAVENOUS
  Filled 2016-05-24: qty 1

## 2016-05-24 MED ORDER — IPRATROPIUM-ALBUTEROL 0.5-2.5 (3) MG/3ML IN SOLN
3.0000 mL | Freq: Four times a day (QID) | RESPIRATORY_TRACT | Status: DC
  Administered 2016-05-25 – 2016-05-31 (×24): 3 mL via RESPIRATORY_TRACT
  Filled 2016-05-24 (×24): qty 3

## 2016-05-24 MED ORDER — HYDRALAZINE HCL 20 MG/ML IJ SOLN: 10.0000 mg | INTRAMUSCULAR | Status: DC | PRN

## 2016-05-24 MED ORDER — IPRATROPIUM-ALBUTEROL 0.5-2.5 (3) MG/3ML IN SOLN
3.0000 mL | Freq: Four times a day (QID) | RESPIRATORY_TRACT | Status: DC
  Administered 2016-05-24 (×3): 3 mL via RESPIRATORY_TRACT
  Filled 2016-05-24 (×3): qty 3
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
## 2016-05-24 NOTE — Progress Notes (Signed)
Earlier tonight, patient had gotten half way through her IV zithromax and called the RN into her room. Her arm had a big red itching area on it. RN stopped the antibiotics and notified the on call NP. NP called back and gave orders to give IV benadryl and that she would call pharmacy and call RN back. When NP called back, she told RN to finish the antibiotics. RN started antibiotics back and they finished with no other problems 

## 2016-05-25 LAB — GLUCOSE, CAPILLARY
GLUCOSE-CAPILLARY: 73 mg/dL (ref 65–99)
GLUCOSE-CAPILLARY: 111 mg/dL — AB (ref 65–99)
GLUCOSE-CAPILLARY: 244 mg/dL — AB (ref 65–99)
Glucose-Capillary: 270 mg/dL — ABNORMAL HIGH (ref 65–99)

## 2016-05-25 MED ORDER — GUAIFENESIN-DM 100-10 MG/5ML PO SYRP
5.0000 mL | ORAL_SOLUTION | ORAL | Status: DC | PRN
  Administered 2016-05-25 – 2016-05-30 (×13): 5 mL via ORAL
  Filled 2016-05-25 (×14): qty 10

## 2016-05-25 MED ORDER — INSULIN ASPART 100 UNIT/ML ~~LOC~~ SOLN
3.0000 [IU] | Freq: Three times a day (TID) | SUBCUTANEOUS | Status: DC
  Administered 2016-05-25 (×2): 3 [IU] via SUBCUTANEOUS

## 2016-05-25 MED ORDER — OXYMETAZOLINE HCL 0.05 % NA SOLN
1.0000 | Freq: Two times a day (BID) | NASAL | Status: AC
  Administered 2016-05-25 – 2016-05-26 (×3): 1 via NASAL
  Filled 2016-05-25: qty 15

## 2016-05-25 MED ORDER — AZITHROMYCIN 250 MG PO TABS
500.0000 mg | ORAL_TABLET | Freq: Every day | ORAL | Status: DC
  Administered 2016-05-25 – 2016-05-27 (×3): 500 mg via ORAL
  Filled 2016-05-25 (×3): qty 2

## 2016-05-25 NOTE — Progress Notes (Signed)
PHARMACIST - PHYSICIAN COMMUNICATION DR:   Malachi BondsShort CONCERNING: Antibiotic IV to Oral Route Change Policy  RECOMMENDATION: This patient is receiving Zithromax by the intravenous route.  Based on criteria approved by the Pharmacy and Therapeutics Committee, the antibiotic(s) is/are being converted to the equivalent oral dose form(s).   DESCRIPTION: These criteria include:  Patient being treated for a respiratory tract infection, urinary tract infection, cellulitis or clostridium difficile associated diarrhea if on metronidazole  The patient is not neutropenic and does not exhibit a GI malabsorption state  The patient is eating (either orally or via tube) and/or has been taking other orally administered medications for a least 24 hours  The patient is improving clinically and has a Tmax < 100.5  If you have questions about this conversion, please contact the Pharmacy Department  []   [phone]( [phone] )  [facility] []   [phone]( [phone] )  [facility] []   [phone]( [phone] )  [facility] []   [phone]( [phone] )  [facility] [x]   [phone]( [phone] )  [facility]    Junita PushMichelle Joanne Salah, PharmD, BCPS Pager: [phone] 05/25/2016@10 :46 AM

## 2016-05-25 NOTE — Progress Notes (Signed)
PROGRESS NOTE  Yvonne LandLynda I Todd  NUU:[id number] DOB: Aug 27, 1951 DOA: 05/23/2016 PCP: Myrlene BrokerElizabeth A Crawford, MD  Brief Narrative:  Yvonne Todd is a 65 y.o. female with medical history significant of COPD, diabetes, hypertension presented with 4 days of worsening dyspnea, possibly triggered by fumes and smoke. She has increased sputum with cough and decreased exercise tolerance. She has been using Spiriva only without benefit.  Chest x-ray concerning for right upper lobe pneumonia.  Assessment & Plan:   Principal Problem:   COPD exacerbation (HCC) Active Problems:   Essential hypertension   Diabetes (HCC)   CAP (community acquired pneumonia)  COPD exacerbation, start to feeling a little better - wean O2 -Duonebs q4 hours -albuterol q2 hours prn -Prednisone 40mg  daily -  Continue flonase -  Start afrin -  Add guiafenesin -  Sputum culture:  GPC  CAP, possible right upper lobe pneumonia -ceftriaxone and azithromycin day 3 -urine strep antigen negative  Diabetes mellitus type II, drug-induced, hemoglobin A1c 6.5, hyperglycemic yesterday, borderline hypoglycemia this morning -d/c Lantus 20u qhs -sliding scale insulin -will need to address use of lantus and glipizide as her outpatient regimen  Hypertension -continue home amlodipine -continue aspirin 81mg   DVT prophylaxis: Lovenox Code Status: Full code Family Communication: None at bedside Disposition Plan: Likely discharge home in 1-2 days  Consultants:   none  Procedures:  none  Antimicrobials:  Anti-infectives    Start     Dose/Rate Route Frequency Ordered Stop   05/25/16 1400  azithromycin (ZITHROMAX) tablet 500 mg     500 mg Oral Daily 05/25/16 1047 05/29/16 0959   05/24/16 1600  azithromycin (ZITHROMAX) 500 mg in dextrose 5 % 250 mL IVPB  Status:  Discontinued     500 mg 250 mL/hr over 60 Minutes Intravenous Every 24 hours 05/23/16 2101 05/25/16 1047   05/24/16 1600  cefTRIAXone (ROCEPHIN) 1 g in dextrose  5 % 50 mL IVPB     1 g 100 mL/hr over 30 Minutes Intravenous Every 24 hours 05/23/16 2101 05/28/16 1559   05/23/16 1415  cefTRIAXone (ROCEPHIN) 1 g in dextrose 5 % 50 mL IVPB     1 g 100 mL/hr over 30 Minutes Intravenous  Once 05/23/16 1403 05/23/16 1532   05/23/16 1415  azithromycin (ZITHROMAX) 500 mg in dextrose 5 % 250 mL IVPB     500 mg 250 mL/hr over 60 Minutes Intravenous  Once 05/23/16 1403 05/23/16 1601        Subjective:  Sinus congestion is improving, somewhat less Yvonne Todd of breath.  Still having severe cough Objective: Vitals:   05/24/16 2007 05/24/16 2112 05/25/16 0637 05/25/16 0800  BP:  138/63 (!) 149/84   Pulse:  88 61   Resp:  17 18   Temp:  97.7 F (36.5 C) 97.6 F (36.4 C)   TempSrc:  Oral Oral   SpO2: 97% 97% 100% 95%  Weight:      Height:        Intake/Output Summary (Last 24 hours) at 05/25/16 1221 Last data filed at 05/24/16 1858  Gross per 24 hour  Intake              300 ml  Output                0 ml  Net              300 ml   Filed Weights   05/23/16 1907  Weight: 96.9 kg (213 lb 9.6 oz)  Examination:  General exam:  Adult Female.  No acute distress.  HEENT:  NCAT, sinus congestion, MMM Respiratory system: Diminished bilateral breath sounds with full expiratory wheeze Cardiovascular system: Regular rate and rhythm, normal S1/S2. No murmurs, rubs, gallops or clicks.  Warm extremities Gastrointestinal system: Normal active bowel sounds, soft, nondistended, nontender. MSK:  Normal tone and bulk, no lower extremity edema Neuro:  Grossly intact    Data Reviewed: I have personally reviewed following labs and imaging studies  CBC:  Recent Labs Lab 05/23/16 1440 05/24/16 0558  WBC 5.6 6.4  NEUTROABS 3.1  --   HGB 12.7 12.5  HCT 39.5 39.4  MCV 87.4 88.9  PLT 224 268   Basic Metabolic Panel:  Recent Labs Lab 05/23/16 1440 05/24/16 0558  NA 143 141  K 3.6 3.8  CL 99* 102  CO2 36* 30  GLUCOSE 142* 225*  BUN 10 11    CREATININE 0.55 0.56  CALCIUM 9.0 9.2   GFR: Estimated Creatinine Clearance: 75.1 mL/min (by C-G formula based on SCr of 0.56 mg/dL). Liver Function Tests:  Recent Labs Lab 05/23/16 1440  AST 20  ALT 22  ALKPHOS 65  BILITOT 0.5  PROT 7.6  ALBUMIN 4.3    Recent Labs Lab 05/23/16 1440  LIPASE 20   No results for input(s): AMMONIA in the last 168 hours. Coagulation Profile: No results for input(s): INR, PROTIME in the last 168 hours. Cardiac Enzymes: No results for input(s): CKTOTAL, CKMB, CKMBINDEX, TROPONINI in the last 168 hours. BNP (last 3 results) No results for input(s): PROBNP in the last 8760 hours. HbA1C:  Recent Labs  05/23/16 1439  HGBA1C 6.5*   CBG:  Recent Labs Lab 05/24/16 1643 05/24/16 1644 05/24/16 2110 05/25/16 0738 05/25/16 1204  GLUCAP 222* 248* 273* 73 111*   Lipid Profile: No results for input(s): CHOL, HDL, LDLCALC, TRIG, CHOLHDL, LDLDIRECT in the last 72 hours. Thyroid Function Tests: No results for input(s): TSH, T4TOTAL, FREET4, T3FREE, THYROIDAB in the last 72 hours. Anemia Panel: No results for input(s): VITAMINB12, FOLATE, FERRITIN, TIBC, IRON, RETICCTPCT in the last 72 hours. Urine analysis:    Component Value Date/Time   COLORURINE YELLOW 06/23/2014 1222   APPEARANCEUR CLEAR 06/23/2014 1222   LABSPEC 1.005 06/23/2014 1222   PHURINE 7.0 06/23/2014 1222   GLUCOSEU NEGATIVE 06/23/2014 1222   HGBUR NEGATIVE 06/23/2014 1222   BILIRUBINUR NEGATIVE 06/23/2014 1222   KETONESUR 15 (A) 06/23/2014 1222   PROTEINUR NEGATIVE 06/23/2014 1222   UROBILINOGEN 0.2 06/23/2014 1222   NITRITE NEGATIVE 06/23/2014 1222   LEUKOCYTESUR TRACE (A) 06/23/2014 1222   Sepsis Labs: @LABRCNTIP (procalcitonin:4,lacticidven:4)  ) Recent Results (from the past 240 hour(s))  Blood culture (routine x 2)     Status: None (Preliminary result)   Collection Time: 05/23/16  2:10 PM  Result Value Ref Range Status   Specimen Description BLOOD RIGHT  ANTECUBITAL  Final   Special Requests BOTTLES DRAWN AEROBIC AND ANAEROBIC  Final   Culture NO GROWTH 1 DAY  Final   Report Status PENDING  Incomplete  Blood culture (routine x 2)     Status: None (Preliminary result)   Collection Time: 05/23/16  2:30 PM  Result Value Ref Range Status   Specimen Description BLOOD RIGHT FOREARM  Final   Special Requests BOTTLES DRAWN AEROBIC AND ANAEROBIC 5CC  Final   Culture NO GROWTH 1 DAY  Final   Report Status PENDING  Incomplete  Culture, sputum-assessment     Status: None   Collection  Time: 05/23/16  6:05 PM  Result Value Ref Range Status   Specimen Description SPU  Final   Special Requests NONE  Final   Sputum evaluation   Final    THIS SPECIMEN IS ACCEPTABLE. RESPIRATORY CULTURE REPORT TO FOLLOW.   Report Status 05/23/2016 FINAL  Final  Culture, respiratory (NON-Expectorated)     Status: None (Preliminary result)   Collection Time: 05/23/16  6:05 PM  Result Value Ref Range Status   Specimen Description SPUTUM  Final   Special Requests NONE  Final   Gram Stain   Final    MODERATE WBC PRESENT,BOTH PMN AND MONONUCLEAR RARE GRAM POSITIVE COCCI IN PAIRS    Culture   Final    CULTURE REINCUBATED FOR BETTER GROWTH Performed at [facility]    Report Status PENDING  Incomplete      Radiology Studies: Dg Chest 2 View  Result Date: 05/23/2016 CLINICAL DATA:  Congestion and productive cough for 4 days. EXAM: CHEST  2 VIEW COMPARISON:  CT chest and PA and lateral chest 06/21/2015. FINDINGS: The lungs are emphysematous. Focal airspace disease is identified in the right upper lobe. The left lung is clear. No pneumothorax or pleural effusion. Heart size is normal. Aortic atherosclerosis is noted. No focal bony abnormality. IMPRESSION: Focal right upper lobe airspace disease worrisome for pneumonia. Recommend followup films to clearing. Emphysema. Electronically Signed   By: Drusilla Kanner M.D.   On: 05/23/2016 13:50     Scheduled  Meds: . amLODipine  10 mg Oral Daily  . aspirin EC  81 mg Oral Daily  . azithromycin  500 mg Oral Daily  . cefTRIAXone (ROCEPHIN)  IV  1 g Intravenous Q24H  . enoxaparin (LOVENOX) injection  50 mg Subcutaneous Q24H  . fluticasone  2 spray Each Nare Daily  . insulin aspart  0-20 Units Subcutaneous TID WC  . insulin aspart  0-5 Units Subcutaneous QHS  . insulin aspart  3 Units Subcutaneous TID WC  . insulin glargine  20 Units Subcutaneous QHS  . ipratropium-albuterol  3 mL Nebulization QID  . oxymetazoline  1 spray Each Nare BID  . predniSONE  40 mg Oral Q breakfast  . simvastatin  20 mg Oral QHS  . sodium chloride flush  3 mL Intravenous Q12H   Continuous Infusions:    LOS: 2 days    Time spent: 30 min    Renae Fickle, MD [facility] Pager [phone]  If 7PM-7AM, please contact night-coverage [email or web address] Password [facility] 05/25/2016, 12:21 PM

## 2016-05-26 ENCOUNTER — Inpatient Hospital Stay (HOSPITAL_COMMUNITY): Payer: Medicare Other

## 2016-05-26 LAB — GLUCOSE, CAPILLARY
GLUCOSE-CAPILLARY: 115 mg/dL — AB (ref 65–99)
GLUCOSE-CAPILLARY: 186 mg/dL — AB (ref 65–99)
GLUCOSE-CAPILLARY: 306 mg/dL — AB (ref 65–99)
GLUCOSE-CAPILLARY: 187 mg/dL — AB (ref 65–99)

## 2016-05-26 LAB — CULTURE, RESPIRATORY: CULTURE: NORMAL

## 2016-05-26 LAB — CULTURE, RESPIRATORY W GRAM STAIN

## 2016-05-26 MED ORDER — FUROSEMIDE 10 MG/ML IJ SOLN
40.0000 mg | Freq: Once | INTRAMUSCULAR | Status: AC
  Administered 2016-05-26: 40 mg via INTRAVENOUS
  Filled 2016-05-26: qty 4

## 2016-05-26 MED ORDER — POTASSIUM CHLORIDE CRYS ER 20 MEQ PO TBCR
20.0000 meq | EXTENDED_RELEASE_TABLET | Freq: Once | ORAL | Status: AC
  Administered 2016-05-26: 20 meq via ORAL
  Filled 2016-05-26: qty 1

## 2016-05-26 NOTE — Progress Notes (Deleted)
Patient had multiple BM today, soap sud enema not necessary. She is having difficulty voiding, bladder scan done and revealed 640 ml urine. In and out catheterization done with 600 ml urine output.

## 2016-05-26 NOTE — Progress Notes (Signed)
PROGRESS NOTE  Yvonne Todd  ZOX:[id number] DOB: October 06, 1950 DOA: 05/23/2016 PCP: Myrlene BrokerElizabeth A Crawford, MD  Brief Narrative:  Yvonne Todd is a 65 y.o. female with medical history significant of COPD, diabetes, hypertension presented with 4 days of worsening dyspnea, possibly triggered by fumes and smoke. She has increased sputum with cough and decreased exercise tolerance. She has been using Spiriva only without benefit.  Chest x-ray concerning for right upper lobe pneumonia.  She has had very slow improvement since admission.  Worsening SOB today.  CXR shows vascular fullness with possible cephalization and ECHO from 2016 c/w diastolic dysfunction.  Will start some diuresis  Assessment & Plan:   Principal Problem:   COPD exacerbation (HCC) Active Problems:   Essential hypertension   Diabetes (HCC)   CAP (community acquired pneumonia)  COPD exacerbation, not feeling well this morning.  Initially felt good but now feeling very SOB, worsening wheeze - wean O2 -Duonebs q4 hours -albuterol q2 hours prn -Prednisone 40mg  daily -  Continue flonase -  Continue afrin -  Continue guiafenesin -  Sputum culture:  GPC -  Repeat CXR:  Possible cephalization  Acute diastolic heart failure -  Lasix 40mg  IV once -  Daily weights and strict I/O  CAP, possible right upper lobe pneumonia -ceftriaxone and azithromycin day 4 -urine strep antigen negative  Diabetes mellitus type II, drug-induced, hemoglobin A1c 6.5, hyperglycemic yesterday, borderline hypoglycemia this morning -sliding scale insulin -will need to address use of lantus and glipizide as her outpatient regimen  Hypertension -continue home amlodipine -continue aspirin 81mg   DVT prophylaxis: Lovenox Code Status: Full code Family Communication: None at bedside Disposition Plan: Likely discharge home in 1-2 days  Consultants:   none  Procedures:  none  Antimicrobials:  Anti-infectives    Start     Dose/Rate Route  Frequency Ordered Stop   05/25/16 1400  azithromycin (ZITHROMAX) tablet 500 mg     500 mg Oral Daily 05/25/16 1047 05/29/16 0959   05/24/16 1600  azithromycin (ZITHROMAX) 500 mg in dextrose 5 % 250 mL IVPB  Status:  Discontinued     500 mg 250 mL/hr over 60 Minutes Intravenous Every 24 hours 05/23/16 2101 05/25/16 1047   05/24/16 1600  cefTRIAXone (ROCEPHIN) 1 g in dextrose 5 % 50 mL IVPB     1 g 100 mL/hr over 30 Minutes Intravenous Every 24 hours 05/23/16 2101 05/28/16 1559   05/23/16 1415  cefTRIAXone (ROCEPHIN) 1 g in dextrose 5 % 50 mL IVPB     1 g 100 mL/hr over 30 Minutes Intravenous  Once 05/23/16 1403 05/23/16 1532   05/23/16 1415  azithromycin (ZITHROMAX) 500 mg in dextrose 5 % 250 mL IVPB     500 mg 250 mL/hr over 60 Minutes Intravenous  Once 05/23/16 1403 05/23/16 1601        Subjective:  Sinus congestion is improving, but worsening SOB this morning.  Barely able to get to bathroom and back.  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  This is very different than baseline.    Objective: Vitals:   05/25/16 2125 05/26/16 0507 05/26/16 0747 05/26/16 1300  BP: (!) 130/50 (!) 149/55  (!) 148/64  Pulse: 62 68  82  Resp: 18 20  18   Temp: 98 F (36.7 C) 97.9 F (36.6 C)  98.4 F (36.9 C)  TempSrc: Oral Oral  Oral  SpO2: 98% 96% 97% 98%  Weight:  Height:        Intake/Output Summary (Last 24 hours) at 05/26/16 1505 Last data filed at 05/26/16 1440  Gross per 24 hour  Intake              370 ml  Output                0 ml  Net              370 ml   Filed Weights   05/23/16 1907  Weight: 96.9 kg (213 lb 9.6 oz)    Examination:  General exam:  Adult Female.  SCM retactions, mild lip cyanosis.   HEENT:  NCAT, sinus congestion, MMM Respiratory system: Diminished bilateral breath sounds but improved aeration to the bases.  More musical, lower pitched wheeze compared to yesterday.  No rales.   + rhonchi Cardiovascular system: Regular rate and rhythm, normal S1/S2. No murmurs, rubs, gallops or clicks.  Warm extremities Gastrointestinal system: Normal active bowel sounds, soft, nondistended, nontender. MSK:  Normal tone and bulk, no lower extremity edema Neuro:  Grossly intact    Data Reviewed: I have personally reviewed following labs and imaging studies  CBC:  Recent Labs Lab 05/23/16 1440 05/24/16 0558  WBC 5.6 6.4  NEUTROABS 3.1  --   HGB 12.7 12.5  HCT 39.5 39.4  MCV 87.4 88.9  PLT 224 268   Basic Metabolic Panel:  Recent Labs Lab 05/23/16 1440 05/24/16 0558  NA 143 141  K 3.6 3.8  CL 99* 102  CO2 36* 30  GLUCOSE 142* 225*  BUN 10 11  CREATININE 0.55 0.56  CALCIUM 9.0 9.2   GFR: Estimated Creatinine Clearance: 75.1 mL/min (by C-G formula based on SCr of 0.56 mg/dL). Liver Function Tests:  Recent Labs Lab 05/23/16 1440  AST 20  ALT 22  ALKPHOS 65  BILITOT 0.5  PROT 7.6  ALBUMIN 4.3    Recent Labs Lab 05/23/16 1440  LIPASE 20   No results for input(s): AMMONIA in the last 168 hours. Coagulation Profile: No results for input(s): INR, PROTIME in the last 168 hours. Cardiac Enzymes: No results for input(s): CKTOTAL, CKMB, CKMBINDEX, TROPONINI in the last 168 hours. BNP (last 3 results) No results for input(s): PROBNP in the last 8760 hours. HbA1C: No results for input(s): HGBA1C in the last 72 hours. CBG:  Recent Labs Lab 05/25/16 1204 05/25/16 1705 05/25/16 2123 05/26/16 0732 05/26/16 1154  GLUCAP 111* 270* 244* 115* 186*   Lipid Profile: No results for input(s): CHOL, HDL, LDLCALC, TRIG, CHOLHDL, LDLDIRECT in the last 72 hours. Thyroid Function Tests: No results for input(s): TSH, T4TOTAL, FREET4, T3FREE, THYROIDAB in the last 72 hours. Anemia Panel: No results for input(s): VITAMINB12, FOLATE, FERRITIN, TIBC, IRON, RETICCTPCT in the last 72 hours. Urine analysis:    Component Value Date/Time   COLORURINE YELLOW  06/23/2014 1222   APPEARANCEUR CLEAR 06/23/2014 1222   LABSPEC 1.005 06/23/2014 1222   PHURINE 7.0 06/23/2014 1222   GLUCOSEU NEGATIVE 06/23/2014 1222   HGBUR NEGATIVE 06/23/2014 1222   BILIRUBINUR NEGATIVE 06/23/2014 1222   KETONESUR 15 (A) 06/23/2014 1222   PROTEINUR NEGATIVE 06/23/2014 1222   UROBILINOGEN 0.2 06/23/2014 1222   NITRITE NEGATIVE 06/23/2014 1222   LEUKOCYTESUR TRACE (A) 06/23/2014 1222   Sepsis Labs: @LABRCNTIP (procalcitonin:4,lacticidven:4)  ) Recent Results (from the past 240 hour(s))  Blood culture (routine x 2)     Status: None (Preliminary result)   Collection Time: 05/23/16  2:10 PM  Result Value  Ref Range Status   Specimen Description BLOOD RIGHT ANTECUBITAL  Final   Special Requests BOTTLES DRAWN AEROBIC AND ANAEROBIC  Final   Culture   Final    NO GROWTH 2 DAYS Performed at [facility]    Report Status PENDING  Incomplete  Blood culture (routine x 2)     Status: None (Preliminary result)   Collection Time: 05/23/16  2:30 PM  Result Value Ref Range Status   Specimen Description BLOOD RIGHT FOREARM  Final   Special Requests BOTTLES DRAWN AEROBIC AND ANAEROBIC 5CC  Final   Culture   Final    NO GROWTH 2 DAYS Performed at [facility]    Report Status PENDING  Incomplete  Culture, sputum-assessment     Status: None   Collection Time: 05/23/16  6:05 PM  Result Value Ref Range Status   Specimen Description SPU  Final   Special Requests NONE  Final   Sputum evaluation   Final    THIS SPECIMEN IS ACCEPTABLE. RESPIRATORY CULTURE REPORT TO FOLLOW.   Report Status 05/23/2016 FINAL  Final  Culture, respiratory (NON-Expectorated)     Status: None   Collection Time: 05/23/16  6:05 PM  Result Value Ref Range Status   Specimen Description SPUTUM  Final   Special Requests NONE  Final   Gram Stain   Final    MODERATE WBC PRESENT,BOTH PMN AND MONONUCLEAR RARE GRAM POSITIVE COCCI IN PAIRS    Culture   Final    Consistent with normal  respiratory flora. Performed at [facility]    Report Status 05/26/2016 FINAL  Final      Radiology Studies: No results found.   Scheduled Meds: . amLODipine  10 mg Oral Daily  . aspirin EC  81 mg Oral Daily  . azithromycin  500 mg Oral Daily  . cefTRIAXone (ROCEPHIN)  IV  1 g Intravenous Q24H  . enoxaparin (LOVENOX) injection  50 mg Subcutaneous Q24H  . fluticasone  2 spray Each Nare Daily  . furosemide  40 mg Intravenous Once  . insulin aspart  0-20 Units Subcutaneous TID WC  . insulin aspart  0-5 Units Subcutaneous QHS  . ipratropium-albuterol  3 mL Nebulization QID  . oxymetazoline  1 spray Each Nare BID  . predniSONE  40 mg Oral Q breakfast  . simvastatin  20 mg Oral QHS  . sodium chloride flush  3 mL Intravenous Q12H   Continuous Infusions:    LOS: 3 days    Time spent: 30 min    Renae Fickle, MD [facility] Pager [phone]  If 7PM-7AM, please contact night-coverage [email or web address] Password [facility] 05/26/2016, 3:05 PM

## 2016-05-26 NOTE — Progress Notes (Signed)
Inpatient Diabetes Program Recommendations  AACE/ADA: New Consensus Statement on Inpatient Glycemic Control (2015)  Target Ranges:  Prepandial:   less than 140 mg/dL      Peak postprandial:   less than 180 mg/dL (1-2 hours)      Critically ill patients:  140 - 180 mg/dL   Results for Yvonne Todd, Yvonne Todd (MRN [id number]) as of 05/26/2016 10:39  Ref. Range 05/25/2016 07:38 05/25/2016 12:04 05/25/2016 17:05 05/25/2016 21:23  Glucose-Capillary Latest Ref Range: 65 - 99 mg/dL 73 782111 (H) 956270 (H) 213244 (H)   Results for Yvonne Todd, Yvonne Todd (MRN [id number]) as of 05/26/2016 10:39  Ref. Range 05/26/2016 07:32  Glucose-Capillary Latest Ref Range: 65 - 99 mg/dL 629115 (H)   Results for Yvonne Todd, Yvonne Todd (MRN [id number]) as of 05/26/2016 10:39  Ref. Range 05/23/2016 14:39  Hemoglobin A1C Latest Ref Range: 4.8 - 5.6 % 6.5 (H)     Admit with: Dyspnea  History: DM, COPD  Home DM Meds: Lantus 20 units QHS       Glipizide 10 mg bid  Current Insulin Orders: Novolog Resistant Correction Scale/ SSI (0-20 units) TID AC + HS      -Note Lantus and Novolog 3 units tidwc discontinued yesterday (09/24).  -CBG this AM was well controlled off Lantus: 115 mg/dl.  -Still having some issues with postprandial glucose elevations likely due to the Prednisone 40 mg daily.      MD- Todd'm not sure patient will need Lantus at time of d/c?  Fasting CBG well controlled this AM despite no Lantus being given yesterday.    Her A1c was 6.5% on admission.  Perhaps she could be discharged home on Glipizide alone with close follow-up with PCP for further adjustments.  While she remains on Prednisone, may want to add back low dose Novolog Meal Coverage here in hospital to help with postprandial elevations: Novolog 3 units tid with meals.      --Will follow patient during hospitalization--  Ambrose FinlandJeannine Johnston Mirha Brucato RN, MSN, CDE Diabetes Coordinator Inpatient Glycemic Control Team Team Pager: [phone] (8a-5p)

## 2016-05-26 NOTE — Progress Notes (Signed)
SATURATION QUALIFICATIONS: (This note is used to comply with regulatory documentation for home oxygen)  Patient Saturations on Room Air at Rest = 90%  Patient Saturations on Room Air while Ambulating = 86%  Patient Saturations on 3 Liters of oxygen while Ambulating = 94%  Please briefly explain why patient needs home oxygen:  Patient o2 stat unable to say above 88% on RA

## 2016-05-26 NOTE — Care Management Note (Signed)
Case Management Note  Patient Details  Name: Yvonne Todd MRN: [id number] Date of Birth: October 30, 1950  Subjective/Objective: 65 y/o f admitted w/COPD. From home. See ED CM note-she has provided patient w/pharmacy resources for reasonable meds/glucometer/strips she can afford under insurance.  Noted on 02.Will monitor for home needs.                  Action/Plan:d/c plan home.   Expected Discharge Date:   (unknown)               Expected Discharge Plan:  Home/Self Care  In-House Referral:     Discharge planning Services  CM Consult  Post Acute Care Choice:    Choice offered to:     DME Arranged:    DME Agency:     HH Arranged:    HH Agency:     Status of Service:  In process, will continue to follow  If discussed at Long Length of Stay Meetings, dates discussed:    Additional Comments:  Lanier ClamMahabir, Shayla Heming, RN 05/26/2016, 12:51 PM

## 2016-05-27 LAB — BASIC METABOLIC PANEL
Anion gap: 10 (ref 5–15)
BUN: 23 mg/dL — AB (ref 6–20)
CHLORIDE: 99 mmol/L — AB (ref 101–111)
CO2: 33 mmol/L — AB (ref 22–32)
CREATININE: 0.67 mg/dL (ref 0.44–1.00)
Calcium: 9.4 mg/dL (ref 8.9–10.3)
GFR calc non Af Amer: >60 mL/min (ref >60)
GLUCOSE: 162 mg/dL — AB (ref 65–99)
Potassium: 4.4 mmol/L (ref 3.5–5.1)
Sodium: 142 mmol/L (ref 135–145)

## 2016-05-27 LAB — GLUCOSE, CAPILLARY
GLUCOSE-CAPILLARY: 223 mg/dL — AB (ref 65–99)
Glucose-Capillary: 133 mg/dL — ABNORMAL HIGH (ref 65–99)
Glucose-Capillary: 388 mg/dL — ABNORMAL HIGH (ref 65–99)
Glucose-Capillary: 205 mg/dL — ABNORMAL HIGH (ref 65–99)

## 2016-05-27 MED ORDER — MOMETASONE FURO-FORMOTEROL FUM 200-5 MCG/ACT IN AERO
2.0000 | INHALATION_SPRAY | Freq: Two times a day (BID) | RESPIRATORY_TRACT | Status: DC
  Administered 2016-05-27 – 2016-05-31 (×9): 2 via RESPIRATORY_TRACT
  Filled 2016-05-27: qty 8.8

## 2016-05-27 MED ORDER — FUROSEMIDE 10 MG/ML IJ SOLN
40.0000 mg | Freq: Once | INTRAMUSCULAR | Status: AC
  Administered 2016-05-27: 40 mg via INTRAVENOUS
  Filled 2016-05-27: qty 4

## 2016-05-27 NOTE — Progress Notes (Signed)
PROGRESS NOTE  CHANIE SOUCEK  FAO:[id number] DOB: [birth year] DOA: 05/23/2016 PCP: Myrlene Broker, MD  Brief Narrative:  Yvonne BEHE is a 65 y.o. female with medical history significant of COPD, diabetes, hypertension presented with 4 days of worsening dyspnea, possibly triggered by fumes and smoke. She has increased sputum with cough and decreased exercise tolerance. She has been using Spiriva only without benefit.  Chest x-ray concerning for right upper lobe pneumonia.  She has had very slow improvement since admission.  CXR shows vascular fullness with possible cephalization and ECHO from 2016 c/w diastolic dysfunction.  Diuresing some.   Assessment & Plan:   Principal Problem:   COPD exacerbation (HCC) Active Problems:   Essential hypertension   Diabetes (HCC)   CAP (community acquired pneumonia)  COPD exacerbation, still SOB this morning.   -  Continue Prednisone 40mg  daily  -  Continue duonebs -  Abx as below -  Dulera -  Sputum culture:  Normal flora -  Continue flonase -  Repeat CXR:  Possible cephalization  Acute diastolic heart failure suggested on my interpretation of CXR.  BUN and creatinine are slightly elevated, but still very dyspneic.  Will try one more dose of lasix. -  Lasix 40mg  IV once -  Weight trending down  CAP, possible right upper lobe pneumonia -ceftriaxone day 5 of 7 - completed more than 1.5 gm of azithromycin -urine strep antigen negative  Diabetes mellitus type II, drug-induced, hemoglobin A1c 6.5, hyperglycemic yesterday, borderline hypoglycemia this morning -sliding scale insulin -will need to address use of lantus and glipizide as her outpatient regimen  Hypertension -continue home amlodipine -continue aspirin 81mg   DVT prophylaxis: Lovenox Code Status: Full code Family Communication: None at bedside Disposition Plan: Likely discharge home in 1-2 days  Consultants:   none  Procedures:  none  Antimicrobials:    Anti-infectives    Start     Dose/Rate Route Frequency Ordered Stop   05/25/16 1400  azithromycin (ZITHROMAX) tablet 500 mg     500 mg Oral Daily 05/25/16 1047 05/29/16 0959   05/24/16 1600  azithromycin (ZITHROMAX) 500 mg in dextrose 5 % 250 mL IVPB  Status:  Discontinued     500 mg 250 mL/hr over 60 Minutes Intravenous Every 24 hours 05/23/16 2101 05/25/16 1047   05/24/16 1600  cefTRIAXone (ROCEPHIN) 1 g in dextrose 5 % 50 mL IVPB     1 g 100 mL/hr over 30 Minutes Intravenous Every 24 hours 05/23/16 2101 05/28/16 1559   05/23/16 1415  cefTRIAXone (ROCEPHIN) 1 g in dextrose 5 % 50 mL IVPB     1 g 100 mL/hr over 30 Minutes Intravenous  Once 05/23/16 1403 05/23/16 1532   05/23/16 1415  azithromycin (ZITHROMAX) 500 mg in dextrose 5 % 250 mL IVPB     500 mg 250 mL/hr over 60 Minutes Intravenous  Once 05/23/16 1403 05/23/16 1601        Subjective:  Still struggling to get to the bathroom and back because of dyspnea, even while wearing oxygen.    Objective: Vitals:   05/27/16 0538 05/27/16 0750 05/27/16 1406 05/27/16 1556  BP: (!) 156/78  (!) 152/64   Pulse: 97  86   Resp: 20  18   Temp: 97.9 F (36.6 C)  98 F (36.7 C)   TempSrc: Oral  Oral   SpO2: 94% 95% 94% 95%  Weight: 92.9 kg (204 lb 14.4 oz)     Height:  Intake/Output Summary (Last 24 hours) at 05/27/16 1627 Last data filed at 05/26/16 2153  Gross per 24 hour  Intake              240 ml  Output                0 ml  Net              240 ml   Filed Weights   05/23/16 1907 05/26/16 1752 05/27/16 0538  Weight: 96.9 kg (213 lb 9.6 oz) 95.8 kg (211 lb 1.6 oz) 92.9 kg (204 lb 14.4 oz)    Examination:  General exam:  Adult Female.  SCM retactions, mild lip cyanosis.   HEENT:  NCAT, sinus congestion, MMM Respiratory system: Diminished bilateral breath sounds but improved aeration to the bases.  Moderate pitch wheeze. No rales.  + rhonchi Cardiovascular system: Regular rate and rhythm, normal S1/S2. No  murmurs, rubs, gallops or clicks.  Warm extremities Gastrointestinal system: Normal active bowel sounds, soft, nondistended, nontender. MSK:  Normal tone and bulk, no lower extremity edema Neuro:  Grossly intact   Data Reviewed: I have personally reviewed following labs and imaging studies  CBC:  Recent Labs Lab 05/23/16 1440 05/24/16 0558  WBC 5.6 6.4  NEUTROABS 3.1  --   HGB 12.7 12.5  HCT 39.5 39.4  MCV 87.4 88.9  PLT 224 268   Basic Metabolic Panel:  Recent Labs Lab 05/23/16 1440 05/24/16 0558 05/27/16 0525  NA 143 141 142  K 3.6 3.8 4.4  CL 99* 102 99*  CO2 36* 30 33*  GLUCOSE 142* 225* 162*  BUN 10 11 23*  CREATININE 0.55 0.56 0.67  CALCIUM 9.0 9.2 9.4   GFR: Estimated Creatinine Clearance: 73.4 mL/min (by C-G formula based on SCr of 0.67 mg/dL). Liver Function Tests:  Recent Labs Lab 05/23/16 1440  AST 20  ALT 22  ALKPHOS 65  BILITOT 0.5  PROT 7.6  ALBUMIN 4.3    Recent Labs Lab 05/23/16 1440  LIPASE 20   No results for input(s): AMMONIA in the last 168 hours. Coagulation Profile: No results for input(s): INR, PROTIME in the last 168 hours. Cardiac Enzymes: No results for input(s): CKTOTAL, CKMB, CKMBINDEX, TROPONINI in the last 168 hours. BNP (last 3 results) No results for input(s): PROBNP in the last 8760 hours. HbA1C: No results for input(s): HGBA1C in the last 72 hours. CBG:  Recent Labs Lab 05/26/16 1154 05/26/16 1639 05/26/16 2118 05/27/16 0732 05/27/16 1137  GLUCAP 186* 306* 187* 133* 223*   Lipid Profile: No results for input(s): CHOL, HDL, LDLCALC, TRIG, CHOLHDL, LDLDIRECT in the last 72 hours. Thyroid Function Tests: No results for input(s): TSH, T4TOTAL, FREET4, T3FREE, THYROIDAB in the last 72 hours. Anemia Panel: No results for input(s): VITAMINB12, FOLATE, FERRITIN, TIBC, IRON, RETICCTPCT in the last 72 hours. Urine analysis:    Component Value Date/Time   COLORURINE YELLOW 06/23/2014 1222   APPEARANCEUR  CLEAR 06/23/2014 1222   LABSPEC 1.005 06/23/2014 1222   PHURINE 7.0 06/23/2014 1222   GLUCOSEU NEGATIVE 06/23/2014 1222   HGBUR NEGATIVE 06/23/2014 1222   BILIRUBINUR NEGATIVE 06/23/2014 1222   KETONESUR 15 (A) 06/23/2014 1222   PROTEINUR NEGATIVE 06/23/2014 1222   UROBILINOGEN 0.2 06/23/2014 1222   NITRITE NEGATIVE 06/23/2014 1222   LEUKOCYTESUR TRACE (A) 06/23/2014 1222   Sepsis Labs: @LABRCNTIP (procalcitonin:4,lacticidven:4)  ) Recent Results (from the past 240 hour(s))  Blood culture (routine x 2)     Status: None (Preliminary result)  Collection Time: 05/23/16  2:10 PM  Result Value Ref Range Status   Specimen Description BLOOD RIGHT ANTECUBITAL  Final   Special Requests BOTTLES DRAWN AEROBIC AND ANAEROBIC  Final   Culture   Final    NO GROWTH 4 DAYS Performed at [facility]    Report Status PENDING  Incomplete  Blood culture (routine x 2)     Status: None (Preliminary result)   Collection Time: 05/23/16  2:30 PM  Result Value Ref Range Status   Specimen Description BLOOD RIGHT FOREARM  Final   Special Requests BOTTLES DRAWN AEROBIC AND ANAEROBIC 5CC  Final   Culture   Final    NO GROWTH 4 DAYS Performed at [facility]    Report Status PENDING  Incomplete  Culture, sputum-assessment     Status: None   Collection Time: 05/23/16  6:05 PM  Result Value Ref Range Status   Specimen Description SPU  Final   Special Requests NONE  Final   Sputum evaluation   Final    THIS SPECIMEN IS ACCEPTABLE. RESPIRATORY CULTURE REPORT TO FOLLOW.   Report Status 05/23/2016 FINAL  Final  Culture, respiratory (NON-Expectorated)     Status: None   Collection Time: 05/23/16  6:05 PM  Result Value Ref Range Status   Specimen Description SPUTUM  Final   Special Requests NONE  Final   Gram Stain   Final    MODERATE WBC PRESENT,BOTH PMN AND MONONUCLEAR RARE GRAM POSITIVE COCCI IN PAIRS    Culture   Final    Consistent with normal respiratory flora. Performed at  [facility]    Report Status 05/26/2016 FINAL  Final      Radiology Studies: Dg Chest Port 1 View  Result Date: 05/26/2016 CLINICAL DATA:  Dyspnea and cough for 1 week. EXAM: PORTABLE CHEST 1 VIEW COMPARISON:  May 23, 2026 FINDINGS: The heart size and mediastinal contours are stable. There is no focal infiltrate, pulmonary edema, or pleural effusion. Prominent central pulmonary arteries are identified unchanged. The visualized skeletal structures are stable. IMPRESSION: No active cardiopulmonary disease. Electronically Signed   By: Sherian Rein M.D.   On: 05/26/2016 15:07     Scheduled Meds: . amLODipine  10 mg Oral Daily  . aspirin EC  81 mg Oral Daily  . azithromycin  500 mg Oral Daily  . cefTRIAXone (ROCEPHIN)  IV  1 g Intravenous Q24H  . enoxaparin (LOVENOX) injection  50 mg Subcutaneous Q24H  . fluticasone  2 spray Each Nare Daily  . insulin aspart  0-20 Units Subcutaneous TID WC  . insulin aspart  0-5 Units Subcutaneous QHS  . ipratropium-albuterol  3 mL Nebulization QID  . mometasone-formoterol  2 puff Inhalation BID  . predniSONE  40 mg Oral Q breakfast  . simvastatin  20 mg Oral QHS  . sodium chloride flush  3 mL Intravenous Q12H   Continuous Infusions:    LOS: 4 days    Time spent: 30 min    Renae Fickle, MD [facility] Pager [phone]  If 7PM-7AM, please contact night-coverage [email or web address] Password [facility] 05/27/2016, 4:27 PM

## 2016-05-27 NOTE — Care Management Important Message (Signed)
Important Message  Patient Details  Name: Yvonne Todd MRN: [id number] Date of Birth: Oct 31, 1950   Medicare Important Message Given:  Yes    Haskell FlirtJamison, Jerlyn Pain 05/27/2016, 3:40 PMImportant Message  Patient Details  Name: Yvonne Todd MRN: [id number] Date of Birth: Oct 31, 1950   Medicare Important Message Given:  Yes    Haskell FlirtJamison, Lezli Danek 05/27/2016, 3:40 PM

## 2016-05-28 LAB — CULTURE, BLOOD (ROUTINE X 2)
CULTURE: NO GROWTH
Culture: NO GROWTH

## 2016-05-28 LAB — BASIC METABOLIC PANEL
ANION GAP: 12 (ref 5–15)
BUN: 20 mg/dL (ref 6–20)
CALCIUM: 10.2 mg/dL (ref 8.9–10.3)
CO2: 34 mmol/L — AB (ref 22–32)
CREATININE: 0.74 mg/dL (ref 0.44–1.00)
Chloride: 96 mmol/L — ABNORMAL LOW (ref 101–111)
Glucose, Bld: 162 mg/dL — ABNORMAL HIGH (ref 65–99)
Potassium: 5 mmol/L (ref 3.5–5.1)
SODIUM: 142 mmol/L (ref 135–145)

## 2016-05-28 LAB — GLUCOSE, CAPILLARY
GLUCOSE-CAPILLARY: 140 mg/dL — AB (ref 65–99)
GLUCOSE-CAPILLARY: 214 mg/dL — AB (ref 65–99)
Glucose-Capillary: 326 mg/dL — ABNORMAL HIGH (ref 65–99)
Glucose-Capillary: 310 mg/dL — ABNORMAL HIGH (ref 65–99)

## 2016-05-28 MED ORDER — METHYLPREDNISOLONE SODIUM SUCC 40 MG IJ SOLR
40.0000 mg | Freq: Two times a day (BID) | INTRAMUSCULAR | Status: DC
  Administered 2016-05-28 – 2016-05-29 (×4): 40 mg via INTRAVENOUS
  Filled 2016-05-28 (×5): qty 1

## 2016-05-28 MED ORDER — GUAIFENESIN ER 600 MG PO TB12
600.0000 mg | ORAL_TABLET | Freq: Two times a day (BID) | ORAL | Status: DC
  Administered 2016-05-28 – 2016-05-31 (×7): 600 mg via ORAL
  Filled 2016-05-28 (×7): qty 1

## 2016-05-28 NOTE — Progress Notes (Signed)
Inpatient Diabetes Program Recommendations  AACE/ADA: New Consensus Statement on Inpatient Glycemic Control (2015)  Target Ranges:  Prepandial:   less than 140 mg/dL      Peak postprandial:   less than 180 mg/dL (1-2 hours)      Critically ill patients:  140 - 180 mg/dL   Results for Yvonne Todd, [name] (MRN [id number]) as of 05/28/2016 10:48  Ref. Range 05/27/2016 07:32 05/27/2016 11:37 05/27/2016 16:38 05/27/2016 21:11  Glucose-Capillary Latest Ref Range: 65 - 99 mg/dL 409133 (H) 811223 (H) 914388 (H) 205 (H)    Home DM Meds: Lantus 20 units QHS                             Glipizide 10 mg bid  Current Insulin Orders: Novolog Resistant Correction Scale/ SSI (0-20 units) TID AC + HS     MD- I'm not sure patient will need Lantus at time of d/c?  Fasting CBG well controlled this AM despite no Lantus being given since 09/23.    Her A1c was 6.5% on admission.  Perhaps she could be discharged home on Glipizide alone with close follow-up with PCP for further adjustments.  While she remains on steroids, may want to start Novolog Meal Coverage here in hospital to help with postprandial elevations:   Novolog 4 units tid with meals (hold if pt eats <50% of meal)     --Will follow patient during hospitalization--  Ambrose FinlandJeannine Johnston Shakea Isip RN, MSN, CDE Diabetes Coordinator Inpatient Glycemic Control Team Team Pager: [phone] (8a-5p)

## 2016-05-28 NOTE — Progress Notes (Addendum)
PROGRESS NOTE  Yvonne LandLynda I Todd  WUJ:[id number] DOB: [birth year] DOA: 05/23/2016 PCP: Myrlene BrokerElizabeth A Crawford, MD  Brief Narrative:  65 y.o. female with medical history significant of COPD, diabetes, hypertension presented with 4 days of worsening dyspnea, possibly triggered by fumes and smoke. She has increased sputum with cough and decreased exercise tolerance. She has been using Spiriva only without benefit.  Chest x-ray concerning for right upper lobe pneumonia.  She has had very slow improvement since admission.  CXR shows vascular fullness with possible cephalization and ECHO from 2016 c/w diastolic dysfunction.  Diuresing some.   Assessment & Plan:  COPD exacerbation, - will change prednisone to Solumedrol as pt with more wheezing and rather course breath sounds  - Continue duonebs - Abx completed  - Sputum culture:  Normal flora - Continue flonase  Acute diastolic heart failure  -  Lasix 40mg  IV once given 9/26 -  Weight trending down Filed Weights   05/26/16 1752 05/27/16 0538 05/28/16 0627  Weight: 95.8 kg (211 lb 1.6 oz) 92.9 kg (204 lb 14.4 oz) 94 kg (207 lb 3.2 oz)  - continue to monitor   CAP, right upper lobe pneumonia,unknown pathogen  - urine strep antigen negative - completed course of ABX  Diabetes mellitus type II, drug-induced, hemoglobin A1c 6.5 - sliding scale insulin - will need to address use of lantus and glipizide as her outpatient regimen - suspect her glipizide can be stopped on discharged to avoid hypoglycemic events   Hypertension, essential  - continue home amlodipine - continue aspirin 81mg   Morbid obesity - pt meets criteria with BMI > 35 and underlying HTN, DM  - Body mass index is 37.9 kg/m.  DVT prophylaxis: Lovenox Code Status: Full code Family Communication: None at bedside Disposition Plan: Likely discharge home in am   Consultants:   None   Procedures:   None   Antimicrobials:  Anti-infectives    Start     Dose/Rate Route  Frequency Ordered Stop   05/25/16 1400  azithromycin (ZITHROMAX) tablet 500 mg  Status:  Discontinued     500 mg Oral Daily 05/25/16 1047 05/27/16 1630   05/24/16 1600  azithromycin (ZITHROMAX) 500 mg in dextrose 5 % 250 mL IVPB  Status:  Discontinued     500 mg 250 mL/hr over 60 Minutes Intravenous Every 24 hours 05/23/16 2101 05/25/16 1047   05/24/16 1600  cefTRIAXone (ROCEPHIN) 1 g in dextrose 5 % 50 mL IVPB     1 g 100 mL/hr over 30 Minutes Intravenous Every 24 hours 05/23/16 2101 05/27/16 1822   05/23/16 1415  cefTRIAXone (ROCEPHIN) 1 g in dextrose 5 % 50 mL IVPB     1 g 100 mL/hr over 30 Minutes Intravenous  Once 05/23/16 1403 05/23/16 1532   05/23/16 1415  azithromycin (ZITHROMAX) 500 mg in dextrose 5 % 250 mL IVPB     500 mg 250 mL/hr over 60 Minutes Intravenous  Once 05/23/16 1403 05/23/16 1601     Subjective:  Still struggling to take deep full breaths. Lots of productive cough.   Objective: Vitals:   05/28/16 0559 05/28/16 0627 05/28/16 0705 05/28/16 0759  BP:   128/61   Pulse: (!) 53     Resp: 20     Temp: 97.8 F (36.6 C)     TempSrc: Oral     SpO2: 98%   95%  Weight:  94 kg (207 lb 3.2 oz)    Height:  Intake/Output Summary (Last 24 hours) at 05/28/16 1025 Last data filed at 05/27/16 2207  Gross per 24 hour  Intake              240 ml  Output                0 ml  Net              240 ml   Filed Weights   05/26/16 1752 05/27/16 0538 05/28/16 0627  Weight: 95.8 kg (211 lb 1.6 oz) 92.9 kg (204 lb 14.4 oz) 94 kg (207 lb 3.2 oz)    Examination:  General exam:  Adult Female.  SCM retactions, mild lip cyanosis.   HEENT:  NCAT, sinus congestion, MMM Respiratory system: Diminished bilateral breath sounds, course and with expiratory wheezing.  Cardiovascular system: Regular rate and rhythm, normal S1/S2. No murmurs, rubs, gallops or clicks.  Warm extremities Gastrointestinal system: Normal active bowel sounds, soft, nondistended, nontender. MSK:  Normal  tone and bulk, no lower extremity edema Neuro:  Grossly intact   Data Reviewed: I have personally reviewed following labs and imaging studies  CBC:  Recent Labs Lab 05/23/16 1440 05/24/16 0558  WBC 5.6 6.4  NEUTROABS 3.1  --   HGB 12.7 12.5  HCT 39.5 39.4  MCV 87.4 88.9  PLT 224 268   Basic Metabolic Panel:  Recent Labs Lab 05/23/16 1440 05/24/16 0558 05/27/16 0525 05/28/16 0530  NA 143 141 142 142  K 3.6 3.8 4.4 5.0  CL 99* 102 99* 96*  CO2 36* 30 33* 34*  GLUCOSE 142* 225* 162* 162*  BUN 10 11 23* 20  CREATININE 0.55 0.56 0.67 0.74  CALCIUM 9.0 9.2 9.4 10.2   Liver Function Tests:  Recent Labs Lab 05/23/16 1440  AST 20  ALT 22  ALKPHOS 65  BILITOT 0.5  PROT 7.6  ALBUMIN 4.3    Recent Labs Lab 05/23/16 1440  LIPASE 20   CBG:  Recent Labs Lab 05/27/16 0732 05/27/16 1137 05/27/16 1638 05/27/16 2111 05/28/16 0750  GLUCAP 133* 223* 388* 205* 140*   Urine analysis:    Component Value Date/Time   COLORURINE YELLOW 06/23/2014 1222   APPEARANCEUR CLEAR 06/23/2014 1222   LABSPEC 1.005 06/23/2014 1222   PHURINE 7.0 06/23/2014 1222   GLUCOSEU NEGATIVE 06/23/2014 1222   HGBUR NEGATIVE 06/23/2014 1222   BILIRUBINUR NEGATIVE 06/23/2014 1222   KETONESUR 15 (A) 06/23/2014 1222   PROTEINUR NEGATIVE 06/23/2014 1222   UROBILINOGEN 0.2 06/23/2014 1222   NITRITE NEGATIVE 06/23/2014 1222   LEUKOCYTESUR TRACE (A) 06/23/2014 1222   Recent Results (from the past 240 hour(s))  Blood culture (routine x 2)     Status: None (Preliminary result)   Collection Time: 05/23/16  2:10 PM  Result Value Ref Range Status   Specimen Description BLOOD RIGHT ANTECUBITAL  Final   Special Requests BOTTLES DRAWN AEROBIC AND ANAEROBIC  Final   Culture   Final    NO GROWTH 4 DAYS Performed at [facility]    Report Status PENDING  Incomplete  Blood culture (routine x 2)     Status: None (Preliminary result)   Collection Time: 05/23/16  2:30 PM  Result Value  Ref Range Status   Specimen Description BLOOD RIGHT FOREARM  Final   Special Requests BOTTLES DRAWN AEROBIC AND ANAEROBIC 5CC  Final   Culture   Final    NO GROWTH 4 DAYS Performed at [facility]    Report Status  PENDING  Incomplete  Culture, sputum-assessment     Status: None   Collection Time: 05/23/16  6:05 PM  Result Value Ref Range Status   Specimen Description SPU  Final   Special Requests NONE  Final   Sputum evaluation   Final    THIS SPECIMEN IS ACCEPTABLE. RESPIRATORY CULTURE REPORT TO FOLLOW.   Report Status 05/23/2016 FINAL  Final  Culture, respiratory (NON-Expectorated)     Status: None   Collection Time: 05/23/16  6:05 PM  Result Value Ref Range Status   Specimen Description SPUTUM  Final   Special Requests NONE  Final   Gram Stain   Final    MODERATE WBC PRESENT,BOTH PMN AND MONONUCLEAR RARE GRAM POSITIVE COCCI IN PAIRS    Culture   Final    Consistent with normal respiratory flora. Performed at [facility]    Report Status 05/26/2016 FINAL  Final      Radiology Studies: Dg Chest Port 1 View  Result Date: 05/26/2016 CLINICAL DATA:  Dyspnea and cough for 1 week. EXAM: PORTABLE CHEST 1 VIEW COMPARISON:  May 23, 2026 FINDINGS: The heart size and mediastinal contours are stable. There is no focal infiltrate, pulmonary edema, or pleural effusion. Prominent central pulmonary arteries are identified unchanged. The visualized skeletal structures are stable. IMPRESSION: No active cardiopulmonary disease. Electronically Signed   By: Sherian Rein M.D.   On: 05/26/2016 15:07     Scheduled Meds: . amLODipine  10 mg Oral Daily  . aspirin EC  81 mg Oral Daily  . enoxaparin (LOVENOX) injection  50 mg Subcutaneous Q24H  . fluticasone  2 spray Each Nare Daily  . guaiFENesin  600 mg Oral BID  . insulin aspart  0-20 Units Subcutaneous TID WC  . insulin aspart  0-5 Units Subcutaneous QHS  . ipratropium-albuterol  3 mL Nebulization QID  .  methylPREDNISolone (SOLU-MEDROL) injection  40 mg Intravenous Q12H  . mometasone-formoterol  2 puff Inhalation BID  . simvastatin  20 mg Oral QHS  . sodium chloride flush  3 mL Intravenous Q12H   Continuous Infusions:    LOS: 5 days    Time spent: 30 min    Debbora Presto, MD [facility] Pager [phone]  If 7PM-7AM, please contact night-coverage [email or web address] Password [facility] 05/28/2016, 10:25 AM

## 2016-05-28 NOTE — Care Management Note (Addendum)
Case Management Note  Patient Details  Name: Yvonne Todd MRN: [id number] Date of Birth: 1951/07/02  Subjective/Objective:   Pt is active with [facility] for her Oxygen which she uses at home. [facility] the [facility].  LM for Ayesha RumpfMary Yonjof.                 Action/Plan:[facility].    Expected Discharge Date:   (unknown)               Expected Discharge Plan:  Home/Self Care  In-House Referral:     Discharge planning Services  CM Consult  Post Acute Care Choice:    Choice offered to:     DME Arranged:  Oxygen DME Agency:  [facility]  HH Arranged:    HH Agency:     Status of Service:  In process, will continue to follow  If discussed at Long Length of Stay Meetings, dates discussed:    Additional Comments:  Yvone NeuCrutchfield, Jackelyn Illingworth M, RN 05/28/2016, 11:05 AM

## 2016-05-29 LAB — BASIC METABOLIC PANEL
ANION GAP: 9 (ref 5–15)
BUN: 25 mg/dL — ABNORMAL HIGH (ref 6–20)
CHLORIDE: 97 mmol/L — AB (ref 101–111)
CO2: 31 mmol/L (ref 22–32)
CREATININE: 0.54 mg/dL (ref 0.44–1.00)
Calcium: 10 mg/dL (ref 8.9–10.3)
GFR calc non Af Amer: >60 mL/min (ref >60)
Glucose, Bld: 285 mg/dL — ABNORMAL HIGH (ref 65–99)
POTASSIUM: 4.7 mmol/L (ref 3.5–5.1)
SODIUM: 137 mmol/L (ref 135–145)

## 2016-05-29 LAB — GLUCOSE, CAPILLARY
GLUCOSE-CAPILLARY: 278 mg/dL — AB (ref 65–99)
GLUCOSE-CAPILLARY: 264 mg/dL — AB (ref 65–99)
Glucose-Capillary: 299 mg/dL — ABNORMAL HIGH (ref 65–99)
Glucose-Capillary: 365 mg/dL — ABNORMAL HIGH (ref 65–99)

## 2016-05-29 NOTE — Progress Notes (Addendum)
Inpatient Diabetes Program Recommendations  AACE/ADA: New Consensus Statement on Inpatient Glycemic Control (2015)  Target Ranges:  Prepandial:   less than 140 mg/dL      Peak postprandial:   less than 180 mg/dL (1-2 hours)      Critically ill patients:  140 - 180 mg/dL   Lab Results  Component Value Date   GLUCAP 264 (H) 05/29/2016   HGBA1C 6.5 (H) 05/23/2016   Review of Glycemic Control  Diabetes history: DM 2 Outpatient Diabetes medications: Lantus 20 units QHS, Glipizde 10 mg BID Current orders for Inpatient glycemic control: Novolog Resistant TID + HS scale  Inpatient Diabetes Program Recommendations: Insulin - Basal: Patient transitioned from PO prednisone to IV solumedrol. Glucose consistently in the mid 200's. Please consider Novolog 4 units meal coverage.   Thanks,  Christena DeemShannon Blanton Kardell RN, MSN, [facility] Inpatient Diabetes Coordinator Team Pager [phone] (8a-5p)

## 2016-05-29 NOTE — Progress Notes (Signed)
SATURATION QUALIFICATIONS:   Patient Saturations on Room Air at Rest = 94 %  Patient Saturations on Room Air while Ambulating = 88 %  Patient Saturations on 2 Liters of oxygen while Ambulating = 90 %  Please briefly explain why patient needs home oxygen: Patient tolerated activity well. After walking about 20 ft O2 sat decreased from 90 to 80%. Breathing became labored with activity, O2 reapplied at 2 lt. Saturation increased 90-95% after rest. Will continue to monitor.

## 2016-05-29 NOTE — Care Management Note (Signed)
Case Management Note  Patient Details  Name: Yvonne Todd MRN: [id number] Date of Birth: 02-Aug-1951  Subjective/Objective: Faxed to [facility], 02 sats,home 02 order. They will deliver to rm @ d/c.Await d/c order.                   Action/Plan:d/c plan home w/home 02.   Expected Discharge Date:   (unknown)               Expected Discharge Plan:  Home/Self Care  In-House Referral:     Discharge planning Services  CM Consult  Post Acute Care Choice:    Choice offered to:     DME Arranged:  Oxygen DME Agency:  [facility]  HH Arranged:    HH Agency:     Status of Service:  In process, will continue to follow  If discussed at Long Length of Stay Meetings, dates discussed:    Additional Comments:  Yvonne Todd, Yvonne Holtsclaw, RN 05/29/2016, 3:57 PM

## 2016-05-29 NOTE — Progress Notes (Signed)
PROGRESS NOTE  Yvonne Todd  ZOX:[id number] DOB: 01-Jul-1951 DOA: 05/23/2016  PCP: Myrlene Broker, MD  Brief Narrative:  65 y.o. female with medical history significant of COPD, diabetes, hypertension presented with 4 days of worsening dyspnea, possibly triggered by fumes and smoke. She has increased sputum with cough and decreased exercise tolerance. She has been using Spiriva only without benefit.  Chest x-ray concerning for right upper lobe pneumonia.  She has had very slow improvement since admission.  CXR shows vascular fullness with possible cephalization and ECHO from 2016 c/w diastolic dysfunction.  Diuresing some.   Assessment & Plan:  COPD exacerbation, - continue solumedrol today as well as pt still with rather course breath sounds bilaterally  - Continue duonebs scheduled and as needed  - Abx completed  - Sputum culture:  Normal flora - Continue flonase - add flutter valve   Acute diastolic heart failure  - Lasix 40mg  IV once given 9/26 - Weight trending inconsistent  - continue to monitor   CAP, right upper lobe pneumonia,unknown pathogen  - urine strep antigen negative - completed course of ABX  Diabetes mellitus type II, drug-induced, hemoglobin A1c 6.5 - sliding scale insulin - will need to address use of lantus and glipizide as her outpatient regimen - suspect her glipizide or Lantus can be stopped on discharged to avoid hypoglycemic events   Hypertension, essential  - continue home amlodipine - continue aspirin 81mg   Morbid obesity - pt meets criteria with BMI > 35 and underlying HTN, DM  - Body mass index is 38.03 kg/m.  DVT prophylaxis: Lovenox Code Status: Full code Family Communication: None at bedside Disposition Plan: Likely discharge home in am   Consultants:   None   Procedures:   None   Antimicrobials:  Anti-infectives    Start     Dose/Rate Route Frequency Ordered Stop   05/25/16 1400  azithromycin (ZITHROMAX) tablet 500  mg  Status:  Discontinued     500 mg Oral Daily 05/25/16 1047 05/27/16 1630   05/24/16 1600  azithromycin (ZITHROMAX) 500 mg in dextrose 5 % 250 mL IVPB  Status:  Discontinued     500 mg 250 mL/hr over 60 Minutes Intravenous Every 24 hours 05/23/16 2101 05/25/16 1047   05/24/16 1600  cefTRIAXone (ROCEPHIN) 1 g in dextrose 5 % 50 mL IVPB     1 g 100 mL/hr over 30 Minutes Intravenous Every 24 hours 05/23/16 2101 05/27/16 1822   05/23/16 1415  cefTRIAXone (ROCEPHIN) 1 g in dextrose 5 % 50 mL IVPB     1 g 100 mL/hr over 30 Minutes Intravenous  Once 05/23/16 1403 05/23/16 1532   05/23/16 1415  azithromycin (ZITHROMAX) 500 mg in dextrose 5 % 250 mL IVPB     500 mg 250 mL/hr over 60 Minutes Intravenous  Once 05/23/16 1403 05/23/16 1601     Subjective:  Still with exertional dyspnea and lots of productive cough.   Objective: Vitals:   05/28/16 2051 05/28/16 2129 05/29/16 0506 05/29/16 1000  BP:  (!) 129/59 139/67 (!) 155/51  Pulse:  74 75 76  Resp:  18 18   Temp:  97.5 F (36.4 C) 98.1 F (36.7 C)   TempSrc:  Oral Oral   SpO2: 93% 98%    Weight:   94.3 kg (207 lb 14.4 oz)   Height:        Intake/Output Summary (Last 24 hours) at 05/29/16 1237 Last data filed at 05/29/16 0900  Gross per 24  hour  Intake              480 ml  Output                0 ml  Net              480 ml   Filed Weights   05/27/16 0538 05/28/16 0627 05/29/16 0506  Weight: 92.9 kg (204 lb 14.4 oz) 94 kg (207 lb 3.2 oz) 94.3 kg (207 lb 14.4 oz)    Examination:  General exam:  Adult Female.  SCM retactions, mild lip cyanosis.   HEENT:  NCAT, sinus congestion, MMM Respiratory system: Diminished bilateral breath sounds, course and with mild expiratory wheezing.  Cardiovascular system: Regular rate and rhythm, normal S1/S2. No murmurs, rubs, gallops or clicks.  Warm extremities Gastrointestinal system: Normal active bowel sounds, soft, nondistended, nontender. MSK:  Normal tone and bulk, no lower extremity  edema Neuro:  Grossly intact   Data Reviewed: I have personally reviewed following labs and imaging studies  CBC:  Recent Labs Lab 05/23/16 1440 05/24/16 0558  WBC 5.6 6.4  NEUTROABS 3.1  --   HGB 12.7 12.5  HCT 39.5 39.4  MCV 87.4 88.9  PLT 224 268   Basic Metabolic Panel:  Recent Labs Lab 05/23/16 1440 05/24/16 0558 05/27/16 0525 05/28/16 0530 05/29/16 0545  NA 143 141 142 142 137  K 3.6 3.8 4.4 5.0 4.7  CL 99* 102 99* 96* 97*  CO2 36* 30 33* 34* 31  GLUCOSE 142* 225* 162* 162* 285*  BUN 10 11 23* 20 25*  CREATININE 0.55 0.56 0.67 0.74 0.54  CALCIUM 9.0 9.2 9.4 10.2 10.0   Liver Function Tests:  Recent Labs Lab 05/23/16 1440  AST 20  ALT 22  ALKPHOS 65  BILITOT 0.5  PROT 7.6  ALBUMIN 4.3    Recent Labs Lab 05/23/16 1440  LIPASE 20   CBG:  Recent Labs Lab 05/28/16 1143 05/28/16 1709 05/28/16 2144 05/29/16 0729 05/29/16 1158  GLUCAP 214* 326* 310* 278* 264*   Urine analysis:    Component Value Date/Time   COLORURINE YELLOW 06/23/2014 1222   APPEARANCEUR CLEAR 06/23/2014 1222   LABSPEC 1.005 06/23/2014 1222   PHURINE 7.0 06/23/2014 1222   GLUCOSEU NEGATIVE 06/23/2014 1222   HGBUR NEGATIVE 06/23/2014 1222   BILIRUBINUR NEGATIVE 06/23/2014 1222   KETONESUR 15 (A) 06/23/2014 1222   PROTEINUR NEGATIVE 06/23/2014 1222   UROBILINOGEN 0.2 06/23/2014 1222   NITRITE NEGATIVE 06/23/2014 1222   LEUKOCYTESUR TRACE (A) 06/23/2014 1222   Recent Results (from the past 240 hour(s))  Blood culture (routine x 2)     Status: None   Collection Time: 05/23/16  2:10 PM  Result Value Ref Range Status   Specimen Description BLOOD RIGHT ANTECUBITAL  Final   Special Requests BOTTLES DRAWN AEROBIC AND ANAEROBIC  Final   Culture   Final    NO GROWTH 5 DAYS Performed at [facility]    Report Status 05/28/2016 FINAL  Final  Blood culture (routine x 2)     Status: None   Collection Time: 05/23/16  2:30 PM  Result Value Ref Range Status    Specimen Description BLOOD RIGHT FOREARM  Final   Special Requests BOTTLES DRAWN AEROBIC AND ANAEROBIC 5CC  Final   Culture   Final    NO GROWTH 5 DAYS Performed at [facility]    Report Status 05/28/2016 FINAL  Final  Culture, sputum-assessment  Status: None   Collection Time: 05/23/16  6:05 PM  Result Value Ref Range Status   Specimen Description SPU  Final   Special Requests NONE  Final   Sputum evaluation   Final    THIS SPECIMEN IS ACCEPTABLE. RESPIRATORY CULTURE REPORT TO FOLLOW.   Report Status 05/23/2016 FINAL  Final  Culture, respiratory (NON-Expectorated)     Status: None   Collection Time: 05/23/16  6:05 PM  Result Value Ref Range Status   Specimen Description SPUTUM  Final   Special Requests NONE  Final   Gram Stain   Final    MODERATE WBC PRESENT,BOTH PMN AND MONONUCLEAR RARE GRAM POSITIVE COCCI IN PAIRS    Culture   Final    Consistent with normal respiratory flora. [facility]    Report Status 05/26/2016 FINAL  Final      Radiology Studies: No results found.   Scheduled Meds: . amLODipine  10 mg Oral Daily  . aspirin EC  81 mg Oral Daily  . enoxaparin (LOVENOX) injection  50 mg Subcutaneous Q24H  . fluticasone  2 spray Each Nare Daily  . guaiFENesin  600 mg Oral BID  . insulin aspart  0-20 Units Subcutaneous TID WC  . insulin aspart  0-5 Units Subcutaneous QHS  . ipratropium-albuterol  3 mL Nebulization QID  . methylPREDNISolone (SOLU-MEDROL) injection  40 mg Intravenous Q12H  . mometasone-formoterol  2 puff Inhalation BID  . simvastatin  20 mg Oral QHS  . sodium chloride flush  3 mL Intravenous Q12H   Continuous Infusions:    LOS: 6 days    Time spent: 30 min    Debbora PrestoMAGICK-Penda Venturi, MD [facility] Pager [phone]  If 7PM-7AM, please contact night-coverage [email or web address] Password [email or web address] 05/29/2016, 12:37 PM

## 2016-05-29 NOTE — Plan of Care (Signed)
Problem: Activity: Goal: Risk for activity intolerance will decrease Outcome: Completed/Met Date Met: 05/29/16 Pt is ambulatory without assist. Pt has experienced increased SOB with activity

## 2016-05-30 LAB — CBC
HCT: 40.2 % (ref 36.0–46.0)
HEMOGLOBIN: 13.2 g/dL (ref 12.0–15.0)
MCH: 27.9 pg (ref 26.0–34.0)
MCHC: 32.8 g/dL (ref 30.0–36.0)
MCV: 85 fL (ref 78.0–100.0)
Platelets: 325 10*3/uL (ref 150–400)
RBC: 4.73 MIL/uL (ref 3.87–5.11)
RDW: 16.4 % — ABNORMAL HIGH (ref 11.5–15.5)
WBC: 11.1 10*3/uL — ABNORMAL HIGH (ref 4.0–10.5)

## 2016-05-30 LAB — BASIC METABOLIC PANEL
ANION GAP: 10 (ref 5–15)
BUN: 24 mg/dL — ABNORMAL HIGH (ref 6–20)
CALCIUM: 10.4 mg/dL — AB (ref 8.9–10.3)
CO2: 33 mmol/L — AB (ref 22–32)
CREATININE: 0.68 mg/dL (ref 0.44–1.00)
Chloride: 95 mmol/L — ABNORMAL LOW (ref 101–111)
GFR calc Af Amer: >60 mL/min (ref >60)
GFR calc non Af Amer: >60 mL/min (ref >60)
GLUCOSE: 323 mg/dL — AB (ref 65–99)
Potassium: 5.1 mmol/L (ref 3.5–5.1)
Sodium: 138 mmol/L (ref 135–145)

## 2016-05-30 LAB — GLUCOSE, CAPILLARY
GLUCOSE-CAPILLARY: 327 mg/dL — AB (ref 65–99)
Glucose-Capillary: 232 mg/dL — ABNORMAL HIGH (ref 65–99)
Glucose-Capillary: 359 mg/dL — ABNORMAL HIGH (ref 65–99)
Glucose-Capillary: 298 mg/dL — ABNORMAL HIGH (ref 65–99)

## 2016-05-30 MED ORDER — PREDNISONE 50 MG PO TABS
50.0000 mg | ORAL_TABLET | Freq: Every day | ORAL | Status: DC
  Administered 2016-05-30 – 2016-05-31 (×2): 50 mg via ORAL
  Filled 2016-05-30 (×2): qty 1

## 2016-05-30 MED ORDER — PREDNISONE 50 MG PO TABS: 50.0000 mg | ORAL_TABLET | Freq: Every day | ORAL | Status: DC

## 2016-05-30 MED ORDER — INFLUENZA VAC SPLIT QUAD 0.5 ML IM SUSY
0.5000 mL | PREFILLED_SYRINGE | INTRAMUSCULAR | Status: AC
  Administered 2016-05-31: 0.5 mL via INTRAMUSCULAR
  Filled 2016-05-30: qty 0.5

## 2016-05-30 NOTE — Progress Notes (Signed)
PROGRESS NOTE  Yvonne Todd  ZOX:[id number] DOB: [birth year] DOA: 05/23/2016  PCP: Myrlene BrokerElizabeth A Crawford, MD  Brief Narrative:  65 y.o. female with medical history significant of COPD, diabetes, hypertension presented with 4 days of worsening dyspnea, possibly triggered by fumes and smoke. She has increased sputum with cough and decreased exercise tolerance. She has been using Spiriva only without benefit.  Chest x-ray concerning for right upper lobe pneumonia.  She has had very slow improvement since admission.  CXR shows vascular fullness with possible cephalization and ECHO from 2016 c/w diastolic dysfunction.  Diuresing some.   Assessment & Plan:  COPD exacerbation, - more clear breath sounds with persistent heavy productive cough  - stop solumedrol as no wheezing on exam, transition to oral Prednisone  - Continue duonebs scheduled and as needed  - Abx completed  - Sputum culture:  Normal flora - Continue flonase - add flutter valve  - needs oxygen upon discharge   Acute diastolic heart failure  - Lasix 40mg  IV once given 9/26 - Weight trending inconsistent  - continue to monitor  - no signs of volume overload at this time   CAP, right upper lobe pneumonia,unknown pathogen  - urine strep antigen negative - completed course of ABX  Diabetes mellitus type II, drug-induced, hemoglobin A1c 6.5 - sliding scale insulin - will need to address use of lantus and glipizide as her outpatient regimen - suspect her glipizide or Lantus can be stopped on discharged to avoid hypoglycemic events   Hypertension, essential  - continue home amlodipine - continue aspirin 81mg   Morbid obesity - pt meets criteria with BMI > 35 and underlying HTN, DM  - Body mass index is 38.34 kg/m.  DVT prophylaxis: Lovenox Code Status: Full code Family Communication: None at bedside Disposition Plan: Likely discharge home in am if doing well on Prednisone   Consultants:   None   Procedures:     None   Antimicrobials:  Anti-infectives    Start     Dose/Rate Route Frequency Ordered Stop   05/25/16 1400  azithromycin (ZITHROMAX) tablet 500 mg  Status:  Discontinued     500 mg Oral Daily 05/25/16 1047 05/27/16 1630   05/24/16 1600  azithromycin (ZITHROMAX) 500 mg in dextrose 5 % 250 mL IVPB  Status:  Discontinued     500 mg 250 mL/hr over 60 Minutes Intravenous Every 24 hours 05/23/16 2101 05/25/16 1047   05/24/16 1600  cefTRIAXone (ROCEPHIN) 1 g in dextrose 5 % 50 mL IVPB     1 g 100 mL/hr over 30 Minutes Intravenous Every 24 hours 05/23/16 2101 05/27/16 1822   05/23/16 1415  cefTRIAXone (ROCEPHIN) 1 g in dextrose 5 % 50 mL IVPB     1 g 100 mL/hr over 30 Minutes Intravenous  Once 05/23/16 1403 05/23/16 1532   05/23/16 1415  azithromycin (ZITHROMAX) 500 mg in dextrose 5 % 250 mL IVPB     500 mg 250 mL/hr over 60 Minutes Intravenous  Once 05/23/16 1403 05/23/16 1601     Subjective:  Still with lots of productive cough.   Objective: Vitals:   05/30/16 0616 05/30/16 0809 05/30/16 0940 05/30/16 1136  BP: (!) 154/56  111/84   Pulse: 65     Resp: 18     Temp: 98.4 F (36.9 C)     TempSrc: Oral     SpO2: 96% 98%  98%  Weight: 95.1 kg (209 lb 9.6 oz)     Height:  Intake/Output Summary (Last 24 hours) at 05/30/16 1206 Last data filed at 05/29/16 2300  Gross per 24 hour  Intake              240 ml  Output                0 ml  Net              240 ml   Filed Weights   05/28/16 0627 05/29/16 0506 05/30/16 0616  Weight: 94 kg (207 lb 3.2 oz) 94.3 kg (207 lb 14.4 oz) 95.1 kg (209 lb 9.6 oz)    Examination:  General exam:  Adult Female.  SCM retactions, mild lip cyanosis.   HEENT:  NCAT, sinus congestion, MMM Respiratory system: Diminished bilateral breath sounds, better air movement bilaterally compared to yesterday  Cardiovascular system: Regular rate and rhythm, normal S1/S2. No murmurs, rubs, gallops or clicks.  Warm extremities Gastrointestinal system:  Normal active bowel sounds, soft, nondistended, nontender. MSK:  Normal tone and bulk, no lower extremity edema Neuro:  Grossly intact   Data Reviewed: I have personally reviewed following labs and imaging studies  CBC:  Recent Labs Lab 05/23/16 1440 05/24/16 0558 05/30/16 0544  WBC 5.6 6.4 11.1*  NEUTROABS 3.1  --   --   HGB 12.7 12.5 13.2  HCT 39.5 39.4 40.2  MCV 87.4 88.9 85.0  PLT 224 268 325   Basic Metabolic Panel:  Recent Labs Lab 05/24/16 0558 05/27/16 0525 05/28/16 0530 05/29/16 0545 05/30/16 0544  NA 141 142 142 137 138  K 3.8 4.4 5.0 4.7 5.1  CL 102 99* 96* 97* 95*  CO2 30 33* 34* 31 33*  GLUCOSE 225* 162* 162* 285* 323*  BUN 11 23* 20 25* 24*  CREATININE 0.56 0.67 0.74 0.54 0.68  CALCIUM 9.2 9.4 10.2 10.0 10.4*   Liver Function Tests:  Recent Labs Lab 05/23/16 1440  AST 20  ALT 22  ALKPHOS 65  BILITOT 0.5  PROT 7.6  ALBUMIN 4.3    Recent Labs Lab 05/23/16 1440  LIPASE 20   CBG:  Recent Labs Lab 05/29/16 1158 05/29/16 1649 05/29/16 2150 05/30/16 0752 05/30/16 1154  GLUCAP 264* 299* 365* 327* 232*   Urine analysis:    Component Value Date/Time   COLORURINE YELLOW 06/23/2014 1222   APPEARANCEUR CLEAR 06/23/2014 1222   LABSPEC 1.005 06/23/2014 1222   PHURINE 7.0 06/23/2014 1222   GLUCOSEU NEGATIVE 06/23/2014 1222   HGBUR NEGATIVE 06/23/2014 1222   BILIRUBINUR NEGATIVE 06/23/2014 1222   KETONESUR 15 (A) 06/23/2014 1222   PROTEINUR NEGATIVE 06/23/2014 1222   UROBILINOGEN 0.2 06/23/2014 1222   NITRITE NEGATIVE 06/23/2014 1222   LEUKOCYTESUR TRACE (A) 06/23/2014 1222   Recent Results (from the past 240 hour(s))  Blood culture (routine x 2)     Status: None   Collection Time: 05/23/16  2:10 PM  Result Value Ref Range Status   Specimen Description BLOOD RIGHT ANTECUBITAL  Final   Special Requests BOTTLES DRAWN AEROBIC AND ANAEROBIC  Final   Culture   Final    NO GROWTH 5 DAYS Performed at [facility]     Report Status 05/28/2016 FINAL  Final  Blood culture (routine x 2)     Status: None   Collection Time: 05/23/16  2:30 PM  Result Value Ref Range Status   Specimen Description BLOOD RIGHT FOREARM  Final   Special Requests BOTTLES DRAWN AEROBIC AND ANAEROBIC 5CC  Final   Culture  Final    NO GROWTH 5 DAYS Performed at [facility]    Report Status 05/28/2016 FINAL  Final  Culture, sputum-assessment     Status: None   Collection Time: 05/23/16  6:05 PM  Result Value Ref Range Status   Specimen Description SPU  Final   Special Requests NONE  Final   Sputum evaluation   Final    THIS SPECIMEN IS ACCEPTABLE. RESPIRATORY CULTURE REPORT TO FOLLOW.   Report Status 05/23/2016 FINAL  Final  Culture, respiratory (NON-Expectorated)     Status: None   Collection Time: 05/23/16  6:05 PM  Result Value Ref Range Status   Specimen Description SPUTUM  Final   Special Requests NONE  Final   Gram Stain   Final    MODERATE WBC PRESENT,BOTH PMN AND MONONUCLEAR RARE GRAM POSITIVE COCCI IN PAIRS    Culture   Final    Consistent with normal respiratory flora. Performed at [facility]    Report Status 05/26/2016 FINAL  Final      Radiology Studies: No results found.   Scheduled Meds: . amLODipine  10 mg Oral Daily  . aspirin EC  81 mg Oral Daily  . enoxaparin (LOVENOX) injection  50 mg Subcutaneous Q24H  . fluticasone  2 spray Each Nare Daily  . guaiFENesin  600 mg Oral BID  . [START ON 05/31/2016] Influenza vac split quadrivalent PF  0.5 mL Intramuscular Tomorrow-1000  . insulin aspart  0-20 Units Subcutaneous TID WC  . insulin aspart  0-5 Units Subcutaneous QHS  . ipratropium-albuterol  3 mL Nebulization QID  . mometasone-formoterol  2 puff Inhalation BID  . predniSONE  50 mg Oral Q breakfast  . simvastatin  20 mg Oral QHS  . sodium chloride flush  3 mL Intravenous Q12H   Continuous Infusions:    LOS: 7 days    Time spent: 30 min    Debbora Presto, MD [facility] Pager [phone]  If 7PM-7AM, please contact night-coverage [email or web address] Password [facility] 05/30/2016, 12:06 PM

## 2016-05-31 LAB — BASIC METABOLIC PANEL
Anion gap: 6 (ref 5–15)
BUN: 22 mg/dL — AB (ref 6–20)
CALCIUM: 9.3 mg/dL (ref 8.9–10.3)
CHLORIDE: 98 mmol/L — AB (ref 101–111)
CO2: 36 mmol/L — AB (ref 22–32)
CREATININE: 0.69 mg/dL (ref 0.44–1.00)
GFR calc non Af Amer: >60 mL/min (ref >60)
GLUCOSE: 159 mg/dL — AB (ref 65–99)
Potassium: 4.2 mmol/L (ref 3.5–5.1)
Sodium: 140 mmol/L (ref 135–145)

## 2016-05-31 LAB — CBC
HCT: 39.4 % (ref 36.0–46.0)
Hemoglobin: 12.9 g/dL (ref 12.0–15.0)
MCH: 28.1 pg (ref 26.0–34.0)
MCHC: 32.7 g/dL (ref 30.0–36.0)
MCV: 85.8 fL (ref 78.0–100.0)
PLATELETS: 291 10*3/uL (ref 150–400)
RBC: 4.59 MIL/uL (ref 3.87–5.11)
RDW: 16.5 % — AB (ref 11.5–15.5)
WBC: 10.6 10*3/uL — ABNORMAL HIGH (ref 4.0–10.5)

## 2016-05-31 LAB — GLUCOSE, CAPILLARY
GLUCOSE-CAPILLARY: 137 mg/dL — AB (ref 65–99)
Glucose-Capillary: 246 mg/dL — ABNORMAL HIGH (ref 65–99)

## 2016-05-31 MED ORDER — ALBUTEROL SULFATE HFA 108 (90 BASE) MCG/ACT IN AERS: 2.0000 | INHALATION_SPRAY | Freq: Four times a day (QID) | RESPIRATORY_TRACT | 3 refills | Status: DC | PRN

## 2016-05-31 MED ORDER — INSULIN GLARGINE 100 UNIT/ML SOLOSTAR PEN: 5.0000 [IU] | PEN_INJECTOR | Freq: Every day | SUBCUTANEOUS | 1 refills | Status: DC

## 2016-05-31 MED ORDER — GLIPIZIDE 10 MG PO TABS: 10.0000 mg | ORAL_TABLET | Freq: Two times a day (BID) | ORAL | 3 refills | Status: DC

## 2016-05-31 MED ORDER — ALBUTEROL SULFATE (2.5 MG/3ML) 0.083% IN NEBU: 2.5000 mg | INHALATION_SOLUTION | RESPIRATORY_TRACT | 0 refills | Status: DC | PRN

## 2016-05-31 MED ORDER — GUAIFENESIN ER 600 MG PO TB12: 600.0000 mg | ORAL_TABLET | Freq: Two times a day (BID) | ORAL | 0 refills | Status: DC

## 2016-05-31 MED ORDER — GLUCOSE BLOOD VI STRP: 1.0000 | ORAL_STRIP | Freq: Three times a day (TID) | 12 refills | Status: DC

## 2016-05-31 MED ORDER — GUAIFENESIN-DM 100-10 MG/5ML PO SYRP: 5.0000 mL | ORAL_SOLUTION | ORAL | 0 refills | Status: DC | PRN

## 2016-05-31 MED ORDER — PREDNISONE 10 MG PO TABS: ORAL_TABLET | ORAL | 0 refills | Status: DC

## 2016-05-31 MED ORDER — IPRATROPIUM-ALBUTEROL 0.5-2.5 (3) MG/3ML IN SOLN: 3.0000 mL | RESPIRATORY_TRACT | Status: DC | PRN

## 2016-05-31 NOTE — Progress Notes (Signed)
Pt discharged to home. DC instructions given. Prescriptions also given. No concerns voiced. Left unit in wheelchair pushed by this Clinical research associatewriter. Pt wheeled out to front of ER where she had parked her car. Pt assisted to her car with no problems. Ambulated form from front of ER to her car without difficulties. O2 placed on front seat of car and remained connected to pt. Pt left facility ground driving herself to home. Left in stable condition. No concerns voiced. VWilliams,rn.

## 2016-05-31 NOTE — Discharge Instructions (Signed)
Chronic Obstructive Pulmonary Disease Chronic obstructive pulmonary disease (COPD) is a common lung condition in which airflow from the lungs is limited. COPD is a general term that can be used to describe many different lung problems that limit airflow, including both chronic bronchitis and emphysema. If you have COPD, your lung function will probably never return to normal, but there are measures you can take to improve lung function and make yourself feel better. CAUSES   Smoking (common).  Exposure to secondhand smoke.  Genetic problems.  Chronic inflammatory lung diseases or recurrent infections. SYMPTOMS  Shortness of breath, especially with physical activity.  Deep, persistent (chronic) cough with a large amount of thick mucus.  Wheezing.  Rapid breaths (tachypnea).  Gray or bluish discoloration (cyanosis) of the skin, especially in your fingers, toes, or lips.  Fatigue.  Weight loss.  Frequent infections or episodes when breathing symptoms become much worse (exacerbations).  Chest tightness. DIAGNOSIS Your health care provider will take a medical history and perform a physical examination to diagnose COPD. Additional tests for COPD may include:  Lung (pulmonary) function tests.  Chest X-ray.  CT scan.  Blood tests. TREATMENT  Treatment for COPD may include:  Inhaler and nebulizer medicines. These help manage the symptoms of COPD and make your breathing more comfortable.  Supplemental oxygen. Supplemental oxygen is only helpful if you have a low oxygen level in your blood.  Exercise and physical activity. These are beneficial for nearly all people with COPD.  Lung surgery or transplant.  Nutrition therapy to gain weight, if you are underweight.  Pulmonary rehabilitation. This may involve working with a team of health care providers and specialists, such as respiratory, occupational, and physical therapists. HOME CARE INSTRUCTIONS  Take all medicines  (inhaled or pills) as directed by your health care provider.  Avoid over-the-counter medicines or cough syrups that dry up your airway (such as antihistamines) and slow down the elimination of secretions unless instructed otherwise by your health care provider.  If you are a smoker, the most important thing that you can do is stop smoking. Continuing to smoke will cause further lung damage and breathing trouble. Ask your health care provider for help with quitting smoking. He or she can direct you to community resources or hospitals that provide support.  Avoid exposure to irritants such as smoke, chemicals, and fumes that aggravate your breathing.  Use oxygen therapy and pulmonary rehabilitation if directed by your health care provider. If you require home oxygen therapy, ask your health care provider whether you should purchase a pulse oximeter to measure your oxygen level at home.  Avoid contact with individuals who have a contagious illness.  Avoid extreme temperature and humidity changes.  Eat healthy foods. Eating smaller, more frequent meals and resting before meals may help you maintain your strength.  Stay active, but balance activity with periods of rest. Exercise and physical activity will help you maintain your ability to do things you want to do.  Preventing infection and hospitalization is very important when you have COPD. Make sure to receive all the vaccines your health care provider recommends, especially the pneumococcal and influenza vaccines. Ask your health care provider whether you need a pneumonia vaccine.  Learn and use relaxation techniques to manage stress.  Learn and use controlled breathing techniques as directed by your health care provider. Controlled breathing techniques include:  Pursed lip breathing. Start by breathing in (inhaling) through your nose for 1 second. Then, purse your lips as if you were   going to whistle and breathe out (exhale) through the  pursed lips for 2 seconds.  Diaphragmatic breathing. Start by putting one hand on your abdomen just above your waist. Inhale slowly through your nose. The hand on your abdomen should move out. Then purse your lips and exhale slowly. You should be able to feel the hand on your abdomen moving in as you exhale.  Learn and use controlled coughing to clear mucus from your lungs. Controlled coughing is a series of short, progressive coughs. The steps of controlled coughing are: 1. Lean your head slightly forward. 2. Breathe in deeply using diaphragmatic breathing. 3. Try to hold your breath for 3 seconds. 4. Keep your mouth slightly open while coughing twice. 5. Spit any mucus out into a tissue. 6. Rest and repeat the steps once or twice as needed. SEEK MEDICAL CARE IF:  You are coughing up more mucus than usual.  There is a change in the color or thickness of your mucus.  Your breathing is more labored than usual.  Your breathing is faster than usual. SEEK IMMEDIATE MEDICAL CARE IF:  You have shortness of breath while you are resting.  You have shortness of breath that prevents you from:  Being able to talk.  Performing your usual physical activities.  You have chest pain lasting longer than 5 minutes.  Your skin color is more cyanotic than usual.  You measure low oxygen saturations for longer than 5 minutes with a pulse oximeter. MAKE SURE YOU:  Understand these instructions.  Will watch your condition.  Will get help right away if you are not doing well or get worse.   This information is not intended to replace advice given to you by your health care provider. Make sure you discuss any questions you have with your health care provider.   Document Released: 05/28/2005 Document Revised: 09/08/2014 Document Reviewed: 04/14/2013 [employer] Interactive Patient Education 2016 [employer]  

## 2016-05-31 NOTE — Discharge Summary (Signed)
Physician Discharge Summary  Yvonne Todd PFX:[id number] DOB: [birth year] DOA: 05/23/2016  PCP: Hoyt Koch, MD  Admit date: 05/23/2016 Discharge date: 05/31/2016  Recommendations for Outpatient Follow-up:  1. Pt will need to follow up with PCP in 2-3 weeks post discharge 2. Please obtain BMP to evaluate electrolytes and kidney function 3. Please also check CBC to evaluate Hg and Hct levels 4. Please note pt needs oxygen upon discharge 5. Please note that dose of lantus was lowered to 5 U QHS based on inpatient CBG control    Discharge Diagnoses:  Principal Problem:   COPD exacerbation ([place]) Active Problems:   Essential hypertension   Diabetes ([place])   CAP (community acquired pneumonia)  Discharge Condition: Stable  Diet recommendation: Heart healthy diet discussed in details   Brief Narrative:  65 y.o.femalewith medical history significant of COPD, diabetes, hypertension presented with 4 days of worsening dyspnea, possibly triggered by fumes and smoke. She has increased sputum with cough and decreased exercise tolerance. She has been using Spiriva only without benefit.  Chest x-ray concerning for right upper lobe pneumonia.  She has had very slow improvement since admission.  CXR shows vascular fullness with possible cephalization and ECHO from 3299 c/w diastolic dysfunction.  Diuresing some.   Assessment & Plan:  COPD exacerbation, - more clear breath sounds with persistent heavy productive cough  - continue to taper off Prednisone  - Continue duonebs as needed  - Abx completed  - Sputum culture:  Normal flora - Continue flonase  Acute diastolic heart failure  - Lasix '40mg'$  IV once given 9/26 - Weight trending inconsistent  - continue to monitor  - no signs of volume overload at this time   CAP, right upper lobe pneumonia,unknown pathogen  - urine strep antigen negative - completed course of ABX  Diabetes mellitus type II, drug-induced, hemoglobin A1c  6.5 - lowered the dose of Lantus based on CBG control inpatient   Hypertension, essential  - continue home amlodipine - continue aspirin '81mg'$   Morbid obesity - pt meets criteria with BMI > 35 and underlying HTN, DM  - Body mass index is 38.34 kg/m.  DVT prophylaxis:Lovenox Code Status:Full code Family Communication:None at bedside Disposition Plan:Home   Consultants:   None   Procedures:   None   Procedures/Studies: Dg Chest 2 View  Result Date: 05/23/2016 CLINICAL DATA:  Congestion and productive cough for 4 days. EXAM: CHEST  2 VIEW COMPARISON:  CT chest and PA and lateral chest 06/21/2015. FINDINGS: The lungs are emphysematous. Focal airspace disease is identified in the right upper lobe. The left lung is clear. No pneumothorax or pleural effusion. Heart size is normal. Aortic atherosclerosis is noted. No focal bony abnormality. IMPRESSION: Focal right upper lobe airspace disease worrisome for pneumonia. Recommend followup films to clearing. Emphysema. Electronically Signed   By: Inge Rise M.D.   On: 05/23/2016 13:50   Dg Chest Port 1 View  Result Date: 05/26/2016 CLINICAL DATA:  Dyspnea and cough for 1 week. EXAM: PORTABLE CHEST 1 VIEW COMPARISON:  May 23, 2026 FINDINGS: The heart size and mediastinal contours are stable. There is no focal infiltrate, pulmonary edema, or pleural effusion. Prominent central pulmonary arteries are identified unchanged. The visualized skeletal structures are stable. IMPRESSION: No active cardiopulmonary disease. Electronically Signed   By: Abelardo Diesel M.D.   On: 05/26/2016 15:07     Discharge Exam: Vitals:   05/30/16 2030 05/31/16 0625  BP: (!) 136/50 128/63  Pulse: (!) 58 61  Resp: 18 20  Temp: 97.6 F (36.4 C) 98.2 F (36.8 C)   Vitals:   05/30/16 1720 05/30/16 2030 05/30/16 2031 05/31/16 0625  BP:  (!) 136/50  128/63  Pulse:  (!) 58  61  Resp:  18  20  Temp:  97.6 F (36.4 C)  98.2 F (36.8 C)   TempSrc:  Oral  Oral  SpO2: 98% 100% 97% 100%  Weight:    94.8 kg (208 lb 15.9 oz)  Height:        General: Pt is alert, follows commands appropriately, not in acute distress Cardiovascular: Regular rate and rhythm, S1/S2 +, no murmurs, no rubs, no gallops Respiratory: Clear to auscultation bilaterally, no wheezing, no crackles, no rhonchi Abdominal: Soft, non tender, non distended, bowel sounds +, no guarding  Discharge Instructions  Discharge Instructions    Diet - low sodium heart healthy    Complete by:  As directed    Increase activity slowly    Complete by:  As directed        Medication List    TAKE these medications   acetaminophen 325 MG tablet Commonly known as:  TYLENOL Take 2 tablets (650 mg total) by mouth every 6 (six) hours as needed for mild pain (or Fever >/= 101).   albuterol (2.5 MG/3ML) 0.083% nebulizer solution Commonly known as:  PROVENTIL Take 3 mLs (2.5 mg total) by nebulization every 2 (two) hours as needed for wheezing. What changed:  Another medication with the same name was added. Make sure you understand how and when to take each.   albuterol 108 (90 Base) MCG/ACT inhaler Commonly known as:  PROVENTIL HFA;VENTOLIN HFA Inhale 2 puffs into the lungs every 6 (six) hours as needed for wheezing or shortness of breath. What changed:  You were already taking a medication with the same name, and this prescription was added. Make sure you understand how and when to take each.   amLODipine 10 MG tablet Commonly known as:  NORVASC Take 1 tablet (10 mg total) by mouth daily.   aspirin 81 MG tablet Take 81 mg by mouth daily.   diphenhydrAMINE 25 MG tablet Commonly known as:  BENADRYL Take 50 mg by mouth at bedtime.   fluticasone 50 MCG/ACT nasal spray Commonly known as:  FLONASE Place 2 sprays into both nostrils 2 (two) times daily.   Fluticasone-Salmeterol 500-50 MCG/DOSE Aepb Commonly known as:  ADVAIR DISKUS Inhale 1 puff into the lungs 2  (two) times daily. Can switch brand as needed   glipiZIDE 10 MG tablet Commonly known as:  GLUCOTROL Take 1 tablet (10 mg total) by mouth 2 (two) times daily before a meal.   glucose blood test strip 1 each by Other route 3 (three) times daily before meals. Use as instructed to test blood sugar. E11.9 What changed:  how much to take  how to take this  when to take this   glucose monitoring kit monitoring kit 1 each by Does not apply route 4 (four) times daily - after meals and at bedtime. 1 month Diabetic Testing Supplies for QAC-QHS accuchecks.Any brand OK. Diagnosis E11.65   guaiFENesin 600 MG 12 hr tablet Commonly known as:  MUCINEX Take 1 tablet (600 mg total) by mouth 2 (two) times daily.   guaiFENesin-dextromethorphan 100-10 MG/5ML syrup Commonly known as:  ROBITUSSIN DM Take 5 mLs by mouth every 4 (four) hours as needed for cough.   Insulin Glargine 100 UNIT/ML Solostar Pen Commonly known as:  LANTUS SOLOSTAR  Inject 5 Units into the skin daily at 10 pm. What changed:  See the new instructions.   Insulin Pen Needle 31G X 8 MM Misc Use as directed to inject insulin daily. E11.9   Lancets Misc Use as directed to check blood sugar. E11.9   Melatonin 5 MG Tabs Take 1 tablet by mouth at bedtime.   nitroGLYCERIN 0.4 MG SL tablet Commonly known as:  NITROSTAT Place 1 tablet (0.4 mg total) under the tongue every 5 (five) minutes as needed for chest pain.   predniSONE 10 MG tablet Commonly known as:  DELTASONE Take 40 mg tablet 10/01 and taper down by 10 mg daily until completed Start taking on:  06/01/2016   simvastatin 20 MG tablet Commonly known as:  ZOCOR TAKE 1 TABLET (20 MG TOTAL) BY MOUTH AT BEDTIME.   tiotropium 18 MCG inhalation capsule Commonly known as:  SPIRIVA HANDIHALER Place 1 capsule (18 mcg total) into inhaler and inhale daily. Can switch brand as needed What changed:  additional instructions       Follow-up Information    [facility]    Contact information: [place] [address] [phone]        Hoyt Koch, MD .   Specialty:  Internal Medicine Contact information: [place] [address] [phone]        Faye Ramsay, MD .   Specialty:  Internal Medicine Why:  call my cell [phone] Contact information: [address] [place] [address] [phone]            The results of significant diagnostics from this hospitalization (including imaging, microbiology, ancillary and laboratory) are listed below for reference.     Microbiology: Recent Results (from the past 240 hour(s))  Blood culture (routine x 2)     Status: None   Collection Time: 05/23/16  2:10 PM  Result Value Ref Range Status   Specimen Description BLOOD RIGHT ANTECUBITAL  Final   Special Requests BOTTLES DRAWN AEROBIC AND ANAEROBIC 10ML  Final   Culture   Final    NO GROWTH 5 DAYS Performed at [facility]    Report Status 05/28/2016 FINAL  Final  Blood culture (routine x 2)     Status: None   Collection Time: 05/23/16  2:30 PM  Result Value Ref Range Status   Specimen Description BLOOD RIGHT FOREARM  Final   Special Requests BOTTLES DRAWN AEROBIC AND ANAEROBIC 5CC  Final   Culture   Final    NO GROWTH 5 DAYS Performed at [facility]    Report Status 05/28/2016 FINAL  Final  Culture, sputum-assessment     Status: None   Collection Time: 05/23/16  6:05 PM  Result Value Ref Range Status   Specimen Description SPU  Final   Special Requests NONE  Final   Sputum evaluation   Final    THIS SPECIMEN IS ACCEPTABLE. RESPIRATORY CULTURE REPORT TO FOLLOW.   Report Status 05/23/2016 FINAL  Final  Culture, respiratory (NON-Expectorated)     Status: None   Collection Time: 05/23/16  6:05 PM  Result Value Ref Range Status   Specimen Description SPUTUM  Final   Special Requests NONE  Final   Gram Stain   Final    MODERATE WBC  PRESENT,BOTH PMN AND MONONUCLEAR RARE GRAM POSITIVE COCCI IN PAIRS    Culture   Final    Consistent with normal respiratory flora. Performed at  [facility]    Report Status 05/26/2016 FINAL  Final     Labs: Basic Metabolic Panel:  Recent Labs Lab 05/27/16 0525 05/28/16 0530 05/29/16 0545 05/30/16 0544 05/31/16 0520  NA 142 142 137 138 140  K 4.4 5.0 4.7 5.1 4.2  CL 99* 96* 97* 95* 98*  CO2 33* 34* 31 33* 36*  GLUCOSE 162* 162* 285* 323* 159*  BUN 23* 20 25* 24* 22*  CREATININE 0.67 0.74 0.54 0.68 0.69  CALCIUM 9.4 10.2 10.0 10.4* 9.3   Liver Function Tests: No results for input(s): AST, ALT, ALKPHOS, BILITOT, PROT, ALBUMIN in the last 168 hours. No results for input(s): LIPASE, AMYLASE in the last 168 hours. No results for input(s): AMMONIA in the last 168 hours. CBC:  Recent Labs Lab 05/30/16 0544 05/31/16 0520  WBC 11.1* 10.6*  HGB 13.2 12.9  HCT 40.2 39.4  MCV 85.0 85.8  PLT 325 291   BNP (last 3 results)  Recent Labs  06/18/15 1623  BNP 39.3    ProBNP (last 3 results) No results for input(s): PROBNP in the last 8760 hours.  CBG:  Recent Labs Lab 05/30/16 1154 05/30/16 1618 05/30/16 2132 05/31/16 0725 05/31/16 1142  GLUCAP 232* 359* 298* 137* 246*     SIGNED: Time coordinating discharge: 30 minutes  MAGICK-Amaria Mundorf, MD  [facility] 05/31/2016, 12:00 PM Pager [phone]  If 7PM-7AM, please contact night-coverage [email or web address] Password [email or web address]

## 2016-06-03 ENCOUNTER — Telehealth: Payer: Self-pay | Admitting: Emergency Medicine

## 2016-06-03 NOTE — Telephone Encounter (Signed)
[facility] called and needs verbal orders to see patient twice a wk for 3 wks then once a wk for 6 wks and 3 visits as needed for skilled nursing. Thanks.

## 2016-06-05 NOTE — Telephone Encounter (Signed)
Spoke to [place] and gave verbal orders. She will fax over for Dr. Okey Duprerawford to sign.

## 2016-06-11 ENCOUNTER — Telehealth: Payer: Self-pay | Admitting: Emergency Medicine

## 2016-06-11 NOTE — Telephone Encounter (Signed)
[facility] called and patients blood sugar 467 and then went down to 413. She was to know if she can do the sliding scale under her blood sugars get under control. If so can you call in the prescription for Humalog. Please advise thanks.

## 2016-06-12 ENCOUNTER — Telehealth: Payer: Self-pay | Admitting: Emergency Medicine

## 2016-06-12 NOTE — Telephone Encounter (Signed)
Would need visit as we have not seen since hospital stay.

## 2016-06-12 NOTE — Telephone Encounter (Signed)
Pt called back and stated she doesn't have the money to come in and be seen for her blood sugars. She stated she got a refill on her humalog and has stated taking it. Her blood sugars have come down. She has a CPE on 07/01/16 at 8:15 so when come then. Thanks.

## 2016-06-12 NOTE — Telephone Encounter (Signed)
Called pt and left message with husband to give us a call back.

## 2016-06-13 NOTE — Telephone Encounter (Signed)
Patient aware.

## 2016-06-13 NOTE — Telephone Encounter (Signed)
It would not be my recommendation. If she is going to come in then it may not be CPE on 07/01/16 if we are addressing other problems. Ask her to bring sugar log with her.

## 2016-06-13 NOTE — Telephone Encounter (Signed)
Patient had refills left on her humolog at home and she had it filled. You discontinued it. Is this ok? Also see note below.

## 2016-07-01 ENCOUNTER — Ambulatory Visit (INDEPENDENT_AMBULATORY_CARE_PROVIDER_SITE_OTHER): Payer: Medicare Other | Admitting: Internal Medicine

## 2016-07-01 ENCOUNTER — Encounter: Payer: Self-pay | Admitting: Internal Medicine

## 2016-07-01 ENCOUNTER — Ambulatory Visit
Admission: RE | Admit: 2016-07-01 | Discharge: 2016-07-01 | Disposition: A | Discharge status: Home / Self Care | Payer: Medicare Other | Source: Ambulatory Visit | Attending: Internal Medicine | Admitting: Internal Medicine

## 2016-07-01 ENCOUNTER — Other Ambulatory Visit (INDEPENDENT_AMBULATORY_CARE_PROVIDER_SITE_OTHER): Payer: Medicare Other

## 2016-07-01 VITALS — BP 134/82 | HR 77 | Temp 97.8°F | Resp 18 | Ht 62.5 in | Wt 210.0 lb

## 2016-07-01 DIAGNOSIS — E785 Hyperlipidemia, unspecified: Secondary | ICD-10-CM

## 2016-07-01 DIAGNOSIS — T380X5A Adverse effect of glucocorticoids and synthetic analogues, initial encounter: Secondary | ICD-10-CM

## 2016-07-01 DIAGNOSIS — J449 Chronic obstructive pulmonary disease, unspecified: Secondary | ICD-10-CM

## 2016-07-01 DIAGNOSIS — Z87891 Personal history of nicotine dependence: Secondary | ICD-10-CM

## 2016-07-01 DIAGNOSIS — Z1231 Encounter for screening mammogram for malignant neoplasm of breast: Secondary | ICD-10-CM

## 2016-07-01 DIAGNOSIS — Z0000 Encounter for general adult medical examination without abnormal findings: Secondary | ICD-10-CM

## 2016-07-01 DIAGNOSIS — E099 Drug or chemical induced diabetes mellitus without complications: Secondary | ICD-10-CM | POA: Diagnosis not present

## 2016-07-01 DIAGNOSIS — Z1159 Encounter for screening for other viral diseases: Secondary | ICD-10-CM

## 2016-07-01 DIAGNOSIS — I10 Essential (primary) hypertension: Secondary | ICD-10-CM

## 2016-07-01 DIAGNOSIS — Z23 Encounter for immunization: Secondary | ICD-10-CM

## 2016-07-01 LAB — LIPID PANEL
CHOL/HDL RATIO: 4
CHOLESTEROL: 194 mg/dL (ref 0–200)
HDL: 47.1 mg/dL (ref >39.00)
LDL Cholesterol: 109 mg/dL — ABNORMAL HIGH (ref 0–99)
NonHDL: 146.59
TRIGLYCERIDES: 187 mg/dL — AB (ref 0.0–149.0)
VLDL: 37.4 mg/dL (ref 0.0–40.0)

## 2016-07-01 MED ORDER — HUMALOG KWIKPEN 100 UNIT/ML ~~LOC~~ SOPN: PEN_INJECTOR | SUBCUTANEOUS | 12 refills | Status: DC

## 2016-07-01 NOTE — Assessment & Plan Note (Signed)
Off steroids and antibiotics now. O2 down to 80% with walking in the office and talked to her about the need for oxygen with activity but she is not interested. Needs follow up to discuss more and to see if this stays further out from acute illness. Not using her meds consistently due to cost.

## 2016-07-01 NOTE — Progress Notes (Signed)
Pre visit review using our clinic review tool, if applicable. No additional management support is needed unless otherwise documented below in the visit note. 

## 2016-07-01 NOTE — Progress Notes (Signed)
   Subjective:    Patient ID: Yvonne Todd, female    DOB: 08-20-1951, 65 y.o.   MRN: [id number]  HPI Here for medicare wellness and physical. Please see A/P for status and treatment of chronic medical problems.   Diet: DM since diabetic Physical activity: sedentary Depression/mood screen: negative Hearing: intact to whispered voice, mild loss Visual acuity: grossly normal, overdue for eye exam  ADLs: capable, some decrease in endurance from recent hospital stay Fall risk: none Home safety: good Cognitive evaluation: intact to orientation, naming, recall and repetition EOL planning: adv directives discussed  I have personally reviewed and have noted 1. The patient's medical and social history - reviewed today no changes 2. Their use of alcohol, tobacco or illicit drugs 3. Their current medications and supplements 4. The patient's functional ability including ADL's, fall risks, home safety risks and hearing or visual impairment. 5. Diet and physical activities 6. Evidence for depression or mood disorders 7. Care team reviewed and updated (available in snapshot)  Review of Systems  Constitutional: Positive for activity change and fatigue. Negative for appetite change, fever and unexpected weight change.  HENT: Negative.   Eyes: Negative.   Respiratory: Positive for cough and shortness of breath. Negative for chest tightness and wheezing.        Much improved since leaving hospital  Cardiovascular: Negative for chest pain, palpitations and leg swelling.  Gastrointestinal: Negative for abdominal distention, abdominal pain, constipation, diarrhea and nausea.  Musculoskeletal: Positive for arthralgias. Negative for back pain, gait problem and joint swelling.  Skin: Negative.   Neurological: Negative.   Psychiatric/Behavioral: Negative.       Objective:   Physical Exam  Constitutional: She is oriented to person, place, and time. She appears well-developed and well-nourished.    HENT:  Head: Normocephalic and atraumatic.  Eyes: EOM are normal.  Neck: Normal range of motion. No JVD present.  Cardiovascular: Normal rate and regular rhythm.   Pulmonary/Chest: Effort normal. No respiratory distress. She has no wheezes. She has no rales.  Some scattered rhonchi which partially clear with cough  Abdominal: Soft. Bowel sounds are normal. She exhibits no distension. There is no tenderness. There is no rebound.  Musculoskeletal: She exhibits no edema.  Lymphadenopathy:    She has no cervical adenopathy.  Neurological: She is alert and oriented to person, place, and time. Coordination normal.  Skin: Skin is warm and dry.  Psychiatric: She has a normal mood and affect.   Vitals:   07/01/16 0817  BP: 134/82  Pulse: 77  Resp: 18  Temp: 97.8 F (36.6 C)  TempSrc: Oral  SpO2: 93%  Weight: 210 lb (95.3 kg)  Height: 5' 2.5" (1.588 m)      Assessment & Plan:  Prevnar 13 given at visit.

## 2016-07-01 NOTE — Assessment & Plan Note (Signed)
Foot exam done. Not complicated. Sugars high with steroid burst recently and is taking humalog temporarily until they return to normal, recent HgA1c 6.5 in the hospital and taking glipizide and lantus 20 units QHS normally.

## 2016-07-01 NOTE — Assessment & Plan Note (Signed)
BP at goal on amlodipine daily. Needs microalbumin at follow up to check for complications.

## 2016-07-01 NOTE — Assessment & Plan Note (Signed)
Colonoscopy not done and she declines today, given prevnar 13 to complete pneumonia series. Flu shot already. Tetanus not up to date. Counseled about sun safety and mole surveillance as well as dangers of distracted driving. Given 10 year screening recommendations.

## 2016-07-01 NOTE — Patient Instructions (Addendum)
We have sent in the refill of the humalog. Keep using it for now but as the steroids are out of your system likely you will not need to keep taking it.   Keep taking the lantus 20 units at night time. Also keep taking the glipizide twice a day.   We will check the hepatitis C screening test today and call you back with the results.   Health Maintenance, Female Adopting a healthy lifestyle and getting preventive care can go a long way to promote health and wellness. Talk with your health care provider about what schedule of regular examinations is right for you. This is a good chance for you to check in with your provider about disease prevention and staying healthy. In between checkups, there are plenty of things you can do on your own. Experts have done a lot of research about which lifestyle changes and preventive measures are most likely to keep you healthy. Ask your health care provider for more information. WEIGHT AND DIET  Eat a healthy diet  Be sure to include plenty of vegetables, fruits, low-fat dairy products, and lean protein.  Do not eat a lot of foods high in solid fats, added sugars, or salt.  Get regular exercise. This is one of the most important things you can do for your health.  Most adults should exercise for at least 150 minutes each week. The exercise should increase your heart rate and make you sweat (moderate-intensity exercise).  Most adults should also do strengthening exercises at least twice a week. This is in addition to the moderate-intensity exercise.  Maintain a healthy weight  Body mass index (BMI) is a measurement that can be used to identify possible weight problems. It estimates body fat based on height and weight. Your health care provider can help determine your BMI and help you achieve or maintain a healthy weight.  For females 109 years of age and older:   A BMI below 18.5 is considered underweight.  A BMI of 18.5 to 24.9 is normal.  A BMI of 25  to 29.9 is considered overweight.  A BMI of 30 and above is considered obese.  Watch levels of cholesterol and blood lipids  You should start having your blood tested for lipids and cholesterol at 66 years of age, then have this test every 5 years.  You may need to have your cholesterol levels checked more often if:  Your lipid or cholesterol levels are high.  You are older than 66 years of age.  You are at high risk for heart disease.  CANCER SCREENING   Lung Cancer  Lung cancer screening is recommended for adults 8-11 years old who are at high risk for lung cancer because of a history of smoking.  A yearly low-dose CT scan of the lungs is recommended for people who:  Currently smoke.  Have quit within the past 15 years.  Have at least a 30-pack-year history of smoking. A pack year is smoking an average of one pack of cigarettes a day for 1 year.  Yearly screening should continue until it has been 15 years since you quit.  Yearly screening should stop if you develop a health problem that would prevent you from having lung cancer treatment.  Breast Cancer  Practice breast self-awareness. This means understanding how your breasts normally appear and feel.  It also means doing regular breast self-exams. Let your health care provider know about any changes, no matter how small.  If you are  in your 27s or 30s, you should have a clinical breast exam (CBE) by a health care provider every 1-3 years as part of a regular health exam.  If you are 67 or older, have a CBE every year. Also consider having a breast X-ray (mammogram) every year.  If you have a family history of breast cancer, talk to your health care provider about genetic screening.  If you are at high risk for breast cancer, talk to your health care provider about having an MRI and a mammogram every year.  Breast cancer gene (BRCA) assessment is recommended for women who have family members with BRCA-related  cancers. BRCA-related cancers include:  Breast.  Ovarian.  Tubal.  Peritoneal cancers.  Results of the assessment will determine the need for genetic counseling and BRCA1 and BRCA2 testing. Cervical Cancer Your health care provider may recommend that you be screened regularly for cancer of the pelvic organs (ovaries, uterus, and vagina). This screening involves a pelvic examination, including checking for microscopic changes to the surface of your cervix (Pap test). You may be encouraged to have this screening done every 3 years, beginning at age 14.  For women ages 69-65, health care providers may recommend pelvic exams and Pap testing every 3 years, or they may recommend the Pap and pelvic exam, combined with testing for human papilloma virus (HPV), every 5 years. Some types of HPV increase your risk of cervical cancer. Testing for HPV may also be done on women of any age with unclear Pap test results.  Other health care providers may not recommend any screening for nonpregnant women who are considered low risk for pelvic cancer and who do not have symptoms. Ask your health care provider if a screening pelvic exam is right for you.  If you have had past treatment for cervical cancer or a condition that could lead to cancer, you need Pap tests and screening for cancer for at least 20 years after your treatment. If Pap tests have been discontinued, your risk factors (such as having a new sexual partner) need to be reassessed to determine if screening should resume. Some women have medical problems that increase the chance of getting cervical cancer. In these cases, your health care provider may recommend more frequent screening and Pap tests. Colorectal Cancer  This type of cancer can be detected and often prevented.  Routine colorectal cancer screening usually begins at 66 years of age and continues through 66 years of age.  Your health care provider may recommend screening at an earlier  age if you have risk factors for colon cancer.  Your health care provider may also recommend using home test kits to check for hidden blood in the stool.  A small camera at the end of a tube can be used to examine your colon directly (sigmoidoscopy or colonoscopy). This is done to check for the earliest forms of colorectal cancer.  Routine screening usually begins at age 21.  Direct examination of the colon should be repeated every 5-10 years through 66 years of age. However, you may need to be screened more often if early forms of precancerous polyps or small growths are found. Skin Cancer  Check your skin from head to toe regularly.  Tell your health care provider about any new moles or changes in moles, especially if there is a change in a mole's shape or color.  Also tell your health care provider if you have a mole that is larger than the size of a  pencil eraser.  Always use sunscreen. Apply sunscreen liberally and repeatedly throughout the day.  Protect yourself by wearing long sleeves, pants, a wide-brimmed hat, and sunglasses whenever you are outside. HEART DISEASE, DIABETES, AND HIGH BLOOD PRESSURE   High blood pressure causes heart disease and increases the risk of stroke. High blood pressure is more likely to develop in:  People who have blood pressure in the high end of the normal range (130-139/85-89 mm Hg).  People who are overweight or obese.  People who are African American.  If you are 57-37 years of age, have your blood pressure checked every 3-5 years. If you are 25 years of age or older, have your blood pressure checked every year. You should have your blood pressure measured twice--once when you are at a hospital or clinic, and once when you are not at a hospital or clinic. Record the average of the two measurements. To check your blood pressure when you are not at a hospital or clinic, you can use:  An automated blood pressure machine at a pharmacy.  A home  blood pressure monitor.  If you are between 52 years and 74 years old, ask your health care provider if you should take aspirin to prevent strokes.  Have regular diabetes screenings. This involves taking a blood sample to check your fasting blood sugar level.  If you are at a normal weight and have a low risk for diabetes, have this test once every three years after 66 years of age.  If you are overweight and have a high risk for diabetes, consider being tested at a younger age or more often. PREVENTING INFECTION  Hepatitis B  If you have a higher risk for hepatitis B, you should be screened for this virus. You are considered at high risk for hepatitis B if:  You were born in a country where hepatitis B is common. Ask your health care provider which countries are considered high risk.  Your parents were born in a high-risk country, and you have not been immunized against hepatitis B (hepatitis B vaccine).  You have HIV or AIDS.  You use needles to inject street drugs.  You live with someone who has hepatitis B.  You have had sex with someone who has hepatitis B.  You get hemodialysis treatment.  You take certain medicines for conditions, including cancer, organ transplantation, and autoimmune conditions. Hepatitis C  Blood testing is recommended for:  Everyone born from [birth year] through 1966.  Anyone with known risk factors for hepatitis C. Sexually transmitted infections (STIs)  You should be screened for sexually transmitted infections (STIs) including gonorrhea and chlamydia if:  You are sexually active and are younger than 66 years of age.  You are older than 66 years of age and your health care provider tells you that you are at risk for this type of infection.  Your sexual activity has changed since you were last screened and you are at an increased risk for chlamydia or gonorrhea. Ask your health care provider if you are at risk.  If you do not have HIV, but are at  risk, it may be recommended that you take a prescription medicine daily to prevent HIV infection. This is called pre-exposure prophylaxis (PrEP). You are considered at risk if:  You are sexually active and do not regularly use condoms or know the HIV status of your partner(s).  You take drugs by injection.  You are sexually active with a partner who has HIV. Talk  with your health care provider about whether you are at high risk of being infected with HIV. If you choose to begin PrEP, you should first be tested for HIV. You should then be tested every 3 months for as long as you are taking PrEP.  PREGNANCY   If you are premenopausal and you may become pregnant, ask your health care provider about preconception counseling.  If you may become pregnant, take 400 to 800 micrograms (mcg) of folic acid every day.  If you want to prevent pregnancy, talk to your health care provider about birth control (contraception). OSTEOPOROSIS AND MENOPAUSE   Osteoporosis is a disease in which the bones lose minerals and strength with aging. This can result in serious bone fractures. Your risk for osteoporosis can be identified using a bone density scan.  If you are 97 years of age or older, or if you are at risk for osteoporosis and fractures, ask your health care provider if you should be screened.  Ask your health care provider whether you should take a calcium or vitamin D supplement to lower your risk for osteoporosis.  Menopause may have certain physical symptoms and risks.  Hormone replacement therapy may reduce some of these symptoms and risks. Talk to your health care provider about whether hormone replacement therapy is right for you.  HOME CARE INSTRUCTIONS   Schedule regular health, dental, and eye exams.  Stay current with your immunizations.   Do not use any tobacco products including cigarettes, chewing tobacco, or electronic cigarettes.  If you are pregnant, do not drink alcohol.  If  you are breastfeeding, limit how much and how often you drink alcohol.  Limit alcohol intake to no more than 1 drink per day for nonpregnant women. One drink equals 12 ounces of beer, 5 ounces of wine, or 1 ounces of hard liquor.  Do not use street drugs.  Do not share needles.  Ask your health care provider for help if you need support or information about quitting drugs.  Tell your health care provider if you often feel depressed.  Tell your health care provider if you have ever been abused or do not feel safe at home.   This information is not intended to replace advice given to you by your health care provider. Make sure you discuss any questions you have with your health care provider.   Document Released: 03/03/2011 Document Revised: 09/08/2014 Document Reviewed: 07/20/2013 Elsevier Interactive Patient Education [employer].

## 2016-07-01 NOTE — Assessment & Plan Note (Signed)
Quit for almost 1 year now. She is reminded not to resume smoking and of the need for full cessation.

## 2016-07-01 NOTE — Assessment & Plan Note (Signed)
No recent lipid panel, checking today and adjust for goal LDL <100. Taking simvastatin 20 mg daily.

## 2016-07-02 LAB — HEPATITIS C ANTIBODY: HCV Ab: NEGATIVE

## 2016-07-09 ENCOUNTER — Other Ambulatory Visit: Payer: Self-pay | Admitting: *Deleted

## 2016-07-09 MED ORDER — SIMVASTATIN 20 MG PO TABS: ORAL_TABLET | ORAL | 3 refills | Status: DC

## 2016-07-09 MED ORDER — HUMALOG KWIKPEN 100 UNIT/ML ~~LOC~~ SOPN: PEN_INJECTOR | SUBCUTANEOUS | 11 refills | Status: DC

## 2016-07-17 ENCOUNTER — Other Ambulatory Visit: Payer: Self-pay | Admitting: Internal Medicine

## 2016-07-17 ENCOUNTER — Other Ambulatory Visit: Payer: Self-pay | Admitting: *Deleted

## 2016-07-17 MED ORDER — TIOTROPIUM BROMIDE MONOHYDRATE 18 MCG IN CAPS: 18.0000 ug | ORAL_CAPSULE | Freq: Every day | RESPIRATORY_TRACT | 11 refills | Status: DC

## 2016-07-17 NOTE — Addendum Note (Signed)
Addended by: Deatra JamesBRAND, Anabia Weatherwax M on: 07/17/2016 04:11 PM   Modules accepted: Orders

## 2016-09-05 ENCOUNTER — Other Ambulatory Visit: Payer: Self-pay | Admitting: Internal Medicine

## 2016-09-11 ENCOUNTER — Other Ambulatory Visit: Payer: Self-pay | Admitting: Internal Medicine

## 2016-09-14 ENCOUNTER — Other Ambulatory Visit: Payer: Self-pay | Admitting: Internal Medicine

## 2016-09-14 DIAGNOSIS — I10 Essential (primary) hypertension: Secondary | ICD-10-CM

## 2016-10-15 ENCOUNTER — Other Ambulatory Visit: Payer: Self-pay | Admitting: Internal Medicine

## 2016-10-27 ENCOUNTER — Ambulatory Visit (INDEPENDENT_AMBULATORY_CARE_PROVIDER_SITE_OTHER): Payer: Medicare Other | Admitting: Internal Medicine

## 2016-10-27 ENCOUNTER — Encounter: Payer: Self-pay | Admitting: Internal Medicine

## 2016-10-27 ENCOUNTER — Other Ambulatory Visit (INDEPENDENT_AMBULATORY_CARE_PROVIDER_SITE_OTHER): Payer: Medicare Other

## 2016-10-27 VITALS — BP 138/78 | HR 74 | Temp 98.1°F | Ht 62.5 in | Wt 209.0 lb

## 2016-10-27 DIAGNOSIS — T380X5A Adverse effect of glucocorticoids and synthetic analogues, initial encounter: Secondary | ICD-10-CM

## 2016-10-27 DIAGNOSIS — E099 Drug or chemical induced diabetes mellitus without complications: Secondary | ICD-10-CM | POA: Diagnosis not present

## 2016-10-27 DIAGNOSIS — J449 Chronic obstructive pulmonary disease, unspecified: Secondary | ICD-10-CM

## 2016-10-27 DIAGNOSIS — I10 Essential (primary) hypertension: Secondary | ICD-10-CM | POA: Diagnosis not present

## 2016-10-27 LAB — HEMOGLOBIN A1C: HEMOGLOBIN A1C: 6.9 % — AB (ref 4.6–6.5)

## 2016-10-27 NOTE — Patient Instructions (Addendum)
We will fill out the forms for the oxygen at night time.   We will check the sugar levels today and call you back with the results.

## 2016-10-27 NOTE — Assessment & Plan Note (Signed)
BP at goal on amlodipine 5 mg daily and will continue to monitor and adjust as needed.

## 2016-10-27 NOTE — Assessment & Plan Note (Signed)
Checking HgA1c and if sugars still stable will stop humalog and continue on lantus and glipizide alone. She is not complicated at this time. She has been able to stay off prednisone since last visit which is helpful however stress has affected her diet and food choices.

## 2016-10-27 NOTE — Progress Notes (Signed)
Pre visit review using our clinic review tool, if applicable. No additional management support is needed unless otherwise documented below in the visit note. 

## 2016-10-27 NOTE — Progress Notes (Signed)
   Subjective:    Patient ID: Yvonne Todd, female    DOB: 04-30-1951, 66 y.o.   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 No new concerns but under a lot of stress with her mother's poor health (she is primary caregiver).   Review of Systems  Constitutional: Positive for activity change, appetite change and fatigue. Negative for chills, fever and unexpected weight change.  HENT: Negative.   Eyes: Negative.   Respiratory: Positive for cough and shortness of breath. Negative for chest tightness and wheezing.   Cardiovascular: Negative.   Gastrointestinal: Negative.   Musculoskeletal: Negative.   Skin: Negative.   Neurological: Negative.   Psychiatric/Behavioral: Negative.       Objective:   Physical Exam  Constitutional: She is oriented to person, place, and time. She appears well-developed and well-nourished.  Overweight  HENT:  Head: Normocephalic and atraumatic.  Eyes: EOM are normal.  Neck: Normal range of motion.  Cardiovascular: Normal rate and regular rhythm.   Pulmonary/Chest: Effort normal and breath sounds normal. No respiratory distress. She has no wheezes. She has no rales.  Lung exam improved from prior.  Abdominal: Soft. Bowel sounds are normal. She exhibits no distension. There is no tenderness. There is  no rebound.  Musculoskeletal: She exhibits no edema.  Neurological: She is alert and oriented to person, place, and time. Coordination normal.  Skin: Skin is warm and dry.   Vitals:   10/27/16 0901  BP: 138/78  Pulse: 74  Temp: 98.1 F (36.7 C)  TempSrc: Oral  SpO2: 91%  Weight: 209 lb (94.8 kg)  Height: 5' 2.5" (1.588 m)      Assessment & Plan:

## 2016-10-27 NOTE — Assessment & Plan Note (Signed)
Still needing nocturnal oxygen and unclear if she needs with exertion but she again declines so will not due walk test in the office today. She is taking advair and spiriva sometimes based on cost, she does still have low oxygen levels in the office (91% today) and significant dyspnea with exertion of minimal to moderate.

## 2016-11-25 ENCOUNTER — Telehealth: Payer: Self-pay | Admitting: Internal Medicine

## 2016-11-25 DIAGNOSIS — H2513 Age-related nuclear cataract, bilateral: Secondary | ICD-10-CM | POA: Diagnosis not present

## 2016-11-25 NOTE — Telephone Encounter (Signed)
I believe it was sent over to be filled out and she wants to know if it has been done or not.

## 2016-11-25 NOTE — Telephone Encounter (Signed)
I called [facility] and left vm to get more information.

## 2016-11-25 NOTE — Telephone Encounter (Signed)
I'm not sure what it is.

## 2016-11-25 NOTE — Telephone Encounter (Signed)
Status of what?

## 2016-11-25 NOTE — Telephone Encounter (Signed)
Called about Certificate of medical necessities and wants status of this. Please advise.

## 2016-12-09 ENCOUNTER — Telehealth: Payer: Self-pay | Admitting: Internal Medicine

## 2016-12-09 NOTE — Telephone Encounter (Signed)
Already sent to scan, dont have form anymore

## 2016-12-09 NOTE — Telephone Encounter (Signed)
The Medical clearance forms sent over to [facility], the yes or no was not filled out, they need this information for her surgery on 4/25

## 2016-12-11 ENCOUNTER — Telehealth: Payer: Self-pay | Admitting: Internal Medicine

## 2016-12-11 NOTE — Telephone Encounter (Signed)
Will be faxing over a CMN that needs correction.  Did fax over on 3/20 but has not received back.  Will be faxing to the A side fax machine.

## 2016-12-24 DIAGNOSIS — H2511 Age-related nuclear cataract, right eye: Secondary | ICD-10-CM | POA: Diagnosis not present

## 2017-01-15 ENCOUNTER — Other Ambulatory Visit: Payer: Self-pay | Admitting: General Practice

## 2017-01-15 MED ORDER — INSULIN GLARGINE 100 UNIT/ML SOLOSTAR PEN: 5.0000 [IU] | PEN_INJECTOR | Freq: Every day | SUBCUTANEOUS | 0 refills | Status: DC

## 2017-01-21 DIAGNOSIS — H2512 Age-related nuclear cataract, left eye: Secondary | ICD-10-CM | POA: Diagnosis not present

## 2017-01-30 ENCOUNTER — Encounter (HOSPITAL_COMMUNITY): Payer: Self-pay | Admitting: Emergency Medicine

## 2017-01-30 ENCOUNTER — Emergency Department (HOSPITAL_COMMUNITY)
Admission: EM | Admit: 2017-01-30 | Discharge: 2017-01-30 | Disposition: A | Discharge status: Home / Self Care | Payer: Medicare Other | Attending: Emergency Medicine | Admitting: Emergency Medicine

## 2017-01-30 ENCOUNTER — Emergency Department (HOSPITAL_COMMUNITY): Payer: Medicare Other

## 2017-01-30 DIAGNOSIS — Y929 Unspecified place or not applicable: Secondary | ICD-10-CM | POA: Insufficient documentation

## 2017-01-30 DIAGNOSIS — Z79899 Other long term (current) drug therapy: Secondary | ICD-10-CM | POA: Diagnosis not present

## 2017-01-30 DIAGNOSIS — J45909 Unspecified asthma, uncomplicated: Secondary | ICD-10-CM | POA: Insufficient documentation

## 2017-01-30 DIAGNOSIS — Y999 Unspecified external cause status: Secondary | ICD-10-CM | POA: Insufficient documentation

## 2017-01-30 DIAGNOSIS — M19012 Primary osteoarthritis, left shoulder: Secondary | ICD-10-CM

## 2017-01-30 DIAGNOSIS — S46002A Unspecified injury of muscle(s) and tendon(s) of the rotator cuff of left shoulder, initial encounter: Secondary | ICD-10-CM | POA: Insufficient documentation

## 2017-01-30 DIAGNOSIS — I10 Essential (primary) hypertension: Secondary | ICD-10-CM | POA: Diagnosis not present

## 2017-01-30 DIAGNOSIS — Y939 Activity, unspecified: Secondary | ICD-10-CM | POA: Insufficient documentation

## 2017-01-30 DIAGNOSIS — M25512 Pain in left shoulder: Secondary | ICD-10-CM

## 2017-01-30 DIAGNOSIS — Z794 Long term (current) use of insulin: Secondary | ICD-10-CM | POA: Insufficient documentation

## 2017-01-30 DIAGNOSIS — F17210 Nicotine dependence, cigarettes, uncomplicated: Secondary | ICD-10-CM | POA: Diagnosis not present

## 2017-01-30 DIAGNOSIS — J449 Chronic obstructive pulmonary disease, unspecified: Secondary | ICD-10-CM | POA: Insufficient documentation

## 2017-01-30 DIAGNOSIS — W010XXA Fall on same level from slipping, tripping and stumbling without subsequent striking against object, initial encounter: Secondary | ICD-10-CM | POA: Insufficient documentation

## 2017-01-30 DIAGNOSIS — E119 Type 2 diabetes mellitus without complications: Secondary | ICD-10-CM | POA: Diagnosis not present

## 2017-01-30 DIAGNOSIS — S4992XA Unspecified injury of left shoulder and upper arm, initial encounter: Secondary | ICD-10-CM | POA: Diagnosis present

## 2017-01-30 DIAGNOSIS — Z96651 Presence of right artificial knee joint: Secondary | ICD-10-CM | POA: Insufficient documentation

## 2017-01-30 MED ORDER — NAPROXEN 500 MG PO TABS: 500.0000 mg | ORAL_TABLET | Freq: Two times a day (BID) | ORAL | 0 refills | Status: DC | PRN

## 2017-01-30 MED ORDER — HYDROCODONE-ACETAMINOPHEN 5-325 MG PO TABS: 1.0000 | ORAL_TABLET | Freq: Four times a day (QID) | ORAL | 0 refills | Status: DC | PRN

## 2017-01-30 NOTE — ED Triage Notes (Signed)
Patient reports that she fell 2 weeks ago and been having left shoulder and upper arm pain since.  Patient states that painful to touch and move.  Patient also c/o left collar bone pain as well.
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
## 2017-01-30 NOTE — Discharge Instructions (Signed)
Wear shoulder sling for no more than 3 days, then begin performing gentle range of motion exercises. Alternate between ice and heat to your shoulder throughout the day, using an ice/heat pack for 20 minutes at a time every hour. Alternate between naprosyn and norco for pain relief. Do not drive or operate machinery with pain medication use. Call orthopedic follow up today or tomorrow to schedule followup appointment for recheck of ongoing shoulder pain in 1 week for further evaluation of your injury. Return to the ER for changes or worsening symptoms.

## 2017-01-30 NOTE — ED Notes (Signed)
Patient offered hallway bed to be seen by EDP quicker, patient stated that she preferred to stay in lobby and wait for a room.

## 2017-02-19 ENCOUNTER — Other Ambulatory Visit: Payer: Self-pay | Admitting: *Deleted

## 2017-02-19 MED ORDER — ALBUTEROL SULFATE (2.5 MG/3ML) 0.083% IN NEBU: 2.5000 mg | INHALATION_SOLUTION | RESPIRATORY_TRACT | 0 refills | Status: DC | PRN

## 2017-03-07 ENCOUNTER — Other Ambulatory Visit: Payer: Self-pay | Admitting: Internal Medicine

## 2017-03-07 DIAGNOSIS — I10 Essential (primary) hypertension: Secondary | ICD-10-CM

## 2017-04-13 ENCOUNTER — Encounter (HOSPITAL_COMMUNITY): Payer: Self-pay | Admitting: *Deleted

## 2017-04-13 ENCOUNTER — Emergency Department (HOSPITAL_COMMUNITY): Payer: Medicare Other

## 2017-04-13 ENCOUNTER — Inpatient Hospital Stay (HOSPITAL_COMMUNITY)
Admission: EM | Admit: 2017-04-13 | Discharge: 2017-04-15 | DRG: 190 | Disposition: A | Discharge status: Home / Self Care | Payer: Medicare Other | Attending: Family Medicine | Admitting: Family Medicine

## 2017-04-13 ENCOUNTER — Other Ambulatory Visit: Payer: Self-pay

## 2017-04-13 DIAGNOSIS — I110 Hypertensive heart disease with heart failure: Secondary | ICD-10-CM | POA: Diagnosis present

## 2017-04-13 DIAGNOSIS — J9621 Acute and chronic respiratory failure with hypoxia: Secondary | ICD-10-CM | POA: Diagnosis present

## 2017-04-13 DIAGNOSIS — I10 Essential (primary) hypertension: Secondary | ICD-10-CM | POA: Diagnosis present

## 2017-04-13 DIAGNOSIS — E785 Hyperlipidemia, unspecified: Secondary | ICD-10-CM | POA: Diagnosis present

## 2017-04-13 DIAGNOSIS — Z6835 Body mass index (BMI) 35.0-35.9, adult: Secondary | ICD-10-CM

## 2017-04-13 DIAGNOSIS — E0965 Drug or chemical induced diabetes mellitus with hyperglycemia: Secondary | ICD-10-CM | POA: Diagnosis present

## 2017-04-13 DIAGNOSIS — R739 Hyperglycemia, unspecified: Secondary | ICD-10-CM

## 2017-04-13 DIAGNOSIS — J441 Chronic obstructive pulmonary disease with (acute) exacerbation: Secondary | ICD-10-CM | POA: Diagnosis present

## 2017-04-13 DIAGNOSIS — R0789 Other chest pain: Secondary | ICD-10-CM | POA: Diagnosis not present

## 2017-04-13 DIAGNOSIS — Z9981 Dependence on supplemental oxygen: Secondary | ICD-10-CM

## 2017-04-13 DIAGNOSIS — R0602 Shortness of breath: Secondary | ICD-10-CM | POA: Diagnosis not present

## 2017-04-13 DIAGNOSIS — Z91013 Allergy to seafood: Secondary | ICD-10-CM | POA: Diagnosis not present

## 2017-04-13 DIAGNOSIS — I5032 Chronic diastolic (congestive) heart failure: Secondary | ICD-10-CM | POA: Diagnosis not present

## 2017-04-13 DIAGNOSIS — Z888 Allergy status to other drugs, medicaments and biological substances status: Secondary | ICD-10-CM | POA: Diagnosis not present

## 2017-04-13 DIAGNOSIS — E099 Drug or chemical induced diabetes mellitus without complications: Secondary | ICD-10-CM | POA: Diagnosis present

## 2017-04-13 DIAGNOSIS — Z96651 Presence of right artificial knee joint: Secondary | ICD-10-CM | POA: Diagnosis present

## 2017-04-13 DIAGNOSIS — Z880 Allergy status to penicillin: Secondary | ICD-10-CM | POA: Diagnosis not present

## 2017-04-13 DIAGNOSIS — Z881 Allergy status to other antibiotic agents status: Secondary | ICD-10-CM

## 2017-04-13 DIAGNOSIS — E118 Type 2 diabetes mellitus with unspecified complications: Secondary | ICD-10-CM | POA: Diagnosis present

## 2017-04-13 DIAGNOSIS — I517 Cardiomegaly: Secondary | ICD-10-CM | POA: Diagnosis not present

## 2017-04-13 DIAGNOSIS — T380X5A Adverse effect of glucocorticoids and synthetic analogues, initial encounter: Secondary | ICD-10-CM | POA: Diagnosis present

## 2017-04-13 DIAGNOSIS — E662 Morbid (severe) obesity with alveolar hypoventilation: Secondary | ICD-10-CM | POA: Diagnosis present

## 2017-04-13 DIAGNOSIS — Z833 Family history of diabetes mellitus: Secondary | ICD-10-CM | POA: Diagnosis not present

## 2017-04-13 DIAGNOSIS — F17210 Nicotine dependence, cigarettes, uncomplicated: Secondary | ICD-10-CM | POA: Diagnosis present

## 2017-04-13 LAB — BASIC METABOLIC PANEL
Anion gap: 8 (ref 5–15)
BUN: 15 mg/dL (ref 6–20)
CALCIUM: 9.2 mg/dL (ref 8.9–10.3)
CO2: 31 mmol/L (ref 22–32)
CREATININE: 0.54 mg/dL (ref 0.44–1.00)
Chloride: 102 mmol/L (ref 101–111)
GFR calc Af Amer: >60 mL/min (ref >60)
GLUCOSE: 172 mg/dL — AB (ref 65–99)
POTASSIUM: 4.2 mmol/L (ref 3.5–5.1)
SODIUM: 141 mmol/L (ref 135–145)

## 2017-04-13 LAB — CBC
HCT: 43.5 % (ref 36.0–46.0)
Hemoglobin: 14.4 g/dL (ref 12.0–15.0)
MCH: 31.2 pg (ref 26.0–34.0)
MCHC: 33.1 g/dL (ref 30.0–36.0)
MCV: 94.2 fL (ref 78.0–100.0)
PLATELETS: 251 10*3/uL (ref 150–400)
RBC: 4.62 MIL/uL (ref 3.87–5.11)
RDW: 12.6 % (ref 11.5–15.5)
WBC: 5.9 10*3/uL (ref 4.0–10.5)

## 2017-04-13 LAB — POCT I-STAT TROPONIN I: TROPONIN I, POC: 0 ng/mL (ref 0.00–0.08)

## 2017-04-13 LAB — TROPONIN I

## 2017-04-13 MED ORDER — ONDANSETRON HCL 4 MG/2ML IJ SOLN: 4.0000 mg | Freq: Four times a day (QID) | INTRAMUSCULAR | Status: DC | PRN

## 2017-04-13 MED ORDER — BISACODYL 10 MG RE SUPP: 10.0000 mg | Freq: Every day | RECTAL | Status: DC | PRN

## 2017-04-13 MED ORDER — LEVOFLOXACIN 750 MG PO TABS
750.0000 mg | ORAL_TABLET | Freq: Every day | ORAL | Status: DC
  Administered 2017-04-14 (×2): 750 mg via ORAL
  Filled 2017-04-13 (×2): qty 1

## 2017-04-13 MED ORDER — ENOXAPARIN SODIUM 40 MG/0.4ML ~~LOC~~ SOLN
40.0000 mg | SUBCUTANEOUS | Status: DC
  Administered 2017-04-14 (×2): 40 mg via SUBCUTANEOUS
  Filled 2017-04-13 (×2): qty 0.4

## 2017-04-13 MED ORDER — METHYLPREDNISOLONE SODIUM SUCC 125 MG IJ SOLR
60.0000 mg | Freq: Two times a day (BID) | INTRAMUSCULAR | Status: DC
  Administered 2017-04-14 – 2017-04-15 (×3): 60 mg via INTRAVENOUS
  Filled 2017-04-13 (×3): qty 2

## 2017-04-13 MED ORDER — HYDROCODONE-ACETAMINOPHEN 5-325 MG PO TABS: 1.0000 | ORAL_TABLET | ORAL | Status: DC | PRN

## 2017-04-13 MED ORDER — GUAIFENESIN ER 600 MG PO TB12
600.0000 mg | ORAL_TABLET | Freq: Two times a day (BID) | ORAL | Status: DC
  Administered 2017-04-14 – 2017-04-15 (×4): 600 mg via ORAL
  Filled 2017-04-13 (×4): qty 1

## 2017-04-13 MED ORDER — INSULIN ASPART 100 UNIT/ML ~~LOC~~ SOLN
0.0000 [IU] | Freq: Every day | SUBCUTANEOUS | Status: DC
  Administered 2017-04-14: 4 [IU] via SUBCUTANEOUS
  Administered 2017-04-14: 3 [IU] via SUBCUTANEOUS

## 2017-04-13 MED ORDER — SODIUM CHLORIDE 0.9 % IV SOLN: 250.0000 mL | INTRAVENOUS | Status: DC | PRN

## 2017-04-13 MED ORDER — INSULIN ASPART 100 UNIT/ML ~~LOC~~ SOLN
0.0000 [IU] | Freq: Three times a day (TID) | SUBCUTANEOUS | Status: DC
  Administered 2017-04-14: 5 [IU] via SUBCUTANEOUS
  Administered 2017-04-14: 3 [IU] via SUBCUTANEOUS
  Administered 2017-04-14: 5 [IU] via SUBCUTANEOUS
  Administered 2017-04-15: 7 [IU] via SUBCUTANEOUS
  Administered 2017-04-15: 3 [IU] via SUBCUTANEOUS

## 2017-04-13 MED ORDER — ALBUTEROL SULFATE (2.5 MG/3ML) 0.083% IN NEBU
5.0000 mg | INHALATION_SOLUTION | Freq: Once | RESPIRATORY_TRACT | Status: AC
  Administered 2017-04-13: 5 mg via RESPIRATORY_TRACT
  Filled 2017-04-13: qty 6

## 2017-04-13 MED ORDER — ALBUTEROL SULFATE (2.5 MG/3ML) 0.083% IN NEBU: 2.5000 mg | INHALATION_SOLUTION | RESPIRATORY_TRACT | Status: DC | PRN

## 2017-04-13 MED ORDER — SIMVASTATIN 20 MG PO TABS: 20.0000 mg | ORAL_TABLET | Freq: Every day | ORAL | Status: DC

## 2017-04-13 MED ORDER — AMLODIPINE BESYLATE 10 MG PO TABS
10.0000 mg | ORAL_TABLET | Freq: Every day | ORAL | Status: DC
  Administered 2017-04-14 – 2017-04-15 (×3): 10 mg via ORAL
  Filled 2017-04-13 (×2): qty 1
  Filled 2017-04-13: qty 2

## 2017-04-13 MED ORDER — IPRATROPIUM BROMIDE 0.02 % IN SOLN
0.5000 mg | Freq: Once | RESPIRATORY_TRACT | Status: AC
  Administered 2017-04-13: 0.5 mg via RESPIRATORY_TRACT
  Filled 2017-04-13: qty 2.5

## 2017-04-13 MED ORDER — IPRATROPIUM-ALBUTEROL 0.5-2.5 (3) MG/3ML IN SOLN
3.0000 mL | Freq: Four times a day (QID) | RESPIRATORY_TRACT | Status: DC
  Administered 2017-04-13 – 2017-04-15 (×5): 3 mL via RESPIRATORY_TRACT
  Filled 2017-04-13 (×5): qty 3

## 2017-04-13 MED ORDER — ALBUTEROL (5 MG/ML) CONTINUOUS INHALATION SOLN
10.0000 mg/h | INHALATION_SOLUTION | Freq: Once | RESPIRATORY_TRACT | Status: AC
  Administered 2017-04-13: 10 mg/h via RESPIRATORY_TRACT
  Filled 2017-04-13: qty 20

## 2017-04-13 MED ORDER — SODIUM CHLORIDE 0.9 % IV SOLN
INTRAVENOUS | Status: DC
  Administered 2017-04-13: 19:00:00 via INTRAVENOUS

## 2017-04-13 MED ORDER — ACETAMINOPHEN 650 MG RE SUPP: 650.0000 mg | Freq: Four times a day (QID) | RECTAL | Status: DC | PRN

## 2017-04-13 MED ORDER — POLYETHYLENE GLYCOL 3350 17 G PO PACK: 17.0000 g | PACK | Freq: Every day | ORAL | Status: DC | PRN

## 2017-04-13 MED ORDER — ONDANSETRON HCL 4 MG PO TABS: 4.0000 mg | ORAL_TABLET | Freq: Four times a day (QID) | ORAL | Status: DC | PRN

## 2017-04-13 MED ORDER — PREDNISONE 20 MG PO TABS
60.0000 mg | ORAL_TABLET | Freq: Once | ORAL | Status: AC
  Administered 2017-04-13: 60 mg via ORAL
  Filled 2017-04-13: qty 3

## 2017-04-13 MED ORDER — SODIUM CHLORIDE 0.9% FLUSH
3.0000 mL | Freq: Two times a day (BID) | INTRAVENOUS | Status: DC
  Administered 2017-04-14 (×3): 3 mL via INTRAVENOUS

## 2017-04-13 MED ORDER — ACETAMINOPHEN 325 MG PO TABS: 650.0000 mg | ORAL_TABLET | Freq: Four times a day (QID) | ORAL | Status: DC | PRN

## 2017-04-13 MED ORDER — SODIUM CHLORIDE 0.9% FLUSH: 3.0000 mL | INTRAVENOUS | Status: DC | PRN

## 2017-04-13 MED ORDER — ASPIRIN EC 81 MG PO TBEC
81.0000 mg | DELAYED_RELEASE_TABLET | Freq: Every day | ORAL | Status: DC
  Administered 2017-04-14 – 2017-04-15 (×3): 81 mg via ORAL
  Filled 2017-04-13 (×3): qty 1

## 2017-04-13 NOTE — ED Notes (Signed)
Attempted to call to give report, will call Dawn per note at 20:40.

## 2017-04-13 NOTE — ED Notes (Signed)
[facility]

## 2017-04-13 NOTE — ED Notes (Signed)
Notified RT about patients nebulizer treatment.

## 2017-04-13 NOTE — ED Notes (Signed)
Provided patient a ham sandwich and a coke to drink with permission from provider. Patient requested her continuous nebulizer be discontinued to eat.
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
## 2017-04-13 NOTE — ED Notes (Signed)
Provided patient a ham sandwich.

## 2017-04-13 NOTE — ED Notes (Signed)
Admitting nurse at bedside 

## 2017-04-13 NOTE — ED Triage Notes (Signed)
Pt states she has a hx of COPD and has had increasing chest pressure and sob today. Alert and oriented. 93%.

## 2017-04-13 NOTE — ED Notes (Signed)
Call report to [place], RN at [place]20:40, [phone]
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
## 2017-04-14 ENCOUNTER — Inpatient Hospital Stay (HOSPITAL_COMMUNITY): Payer: Medicare Other

## 2017-04-14 DIAGNOSIS — T380X5A Adverse effect of glucocorticoids and synthetic analogues, initial encounter: Secondary | ICD-10-CM

## 2017-04-14 DIAGNOSIS — I517 Cardiomegaly: Secondary | ICD-10-CM

## 2017-04-14 DIAGNOSIS — I5032 Chronic diastolic (congestive) heart failure: Secondary | ICD-10-CM

## 2017-04-14 DIAGNOSIS — I10 Essential (primary) hypertension: Secondary | ICD-10-CM

## 2017-04-14 DIAGNOSIS — R0789 Other chest pain: Secondary | ICD-10-CM

## 2017-04-14 DIAGNOSIS — J441 Chronic obstructive pulmonary disease with (acute) exacerbation: Principal | ICD-10-CM

## 2017-04-14 DIAGNOSIS — E099 Drug or chemical induced diabetes mellitus without complications: Secondary | ICD-10-CM

## 2017-04-14 LAB — COMPREHENSIVE METABOLIC PANEL
ALT: 16 U/L (ref 14–54)
ANION GAP: 9 (ref 5–15)
AST: 18 U/L (ref 15–41)
Albumin: 4.2 g/dL (ref 3.5–5.0)
Alkaline Phosphatase: 65 U/L (ref 38–126)
BUN: 14 mg/dL (ref 6–20)
CHLORIDE: 102 mmol/L (ref 101–111)
CO2: 31 mmol/L (ref 22–32)
Calcium: 9.3 mg/dL (ref 8.9–10.3)
Creatinine, Ser: 0.51 mg/dL (ref 0.44–1.00)
Glucose, Bld: 166 mg/dL — ABNORMAL HIGH (ref 65–99)
POTASSIUM: 4.3 mmol/L (ref 3.5–5.1)
SODIUM: 142 mmol/L (ref 135–145)
TOTAL PROTEIN: 7 g/dL (ref 6.5–8.1)
Total Bilirubin: 0.5 mg/dL (ref 0.3–1.2)

## 2017-04-14 LAB — GLUCOSE, CAPILLARY
GLUCOSE-CAPILLARY: 210 mg/dL — AB (ref 65–99)
GLUCOSE-CAPILLARY: 260 mg/dL — AB (ref 65–99)
GLUCOSE-CAPILLARY: 272 mg/dL — AB (ref 65–99)
GLUCOSE-CAPILLARY: 310 mg/dL — AB (ref 65–99)
Glucose-Capillary: 349 mg/dL — ABNORMAL HIGH (ref 65–99)

## 2017-04-14 LAB — CBC
HEMATOCRIT: 41.9 % (ref 36.0–46.0)
HEMOGLOBIN: 13.8 g/dL (ref 12.0–15.0)
MCH: 30.9 pg (ref 26.0–34.0)
MCHC: 32.9 g/dL (ref 30.0–36.0)
MCV: 93.7 fL (ref 78.0–100.0)
Platelets: 257 10*3/uL (ref 150–400)
RBC: 4.47 MIL/uL (ref 3.87–5.11)
RDW: 12.5 % (ref 11.5–15.5)
WBC: 7.3 10*3/uL (ref 4.0–10.5)

## 2017-04-14 LAB — LIPID PANEL
Cholesterol: 311 mg/dL — ABNORMAL HIGH (ref 0–200)
HDL: 45 mg/dL (ref >40)
LDL CALC: 218 mg/dL — AB (ref 0–99)
Total CHOL/HDL Ratio: 6.9 RATIO
Triglycerides: 238 mg/dL — ABNORMAL HIGH (ref <150)
VLDL: 48 mg/dL — ABNORMAL HIGH (ref 0–40)

## 2017-04-14 LAB — URINALYSIS, ROUTINE W REFLEX MICROSCOPIC
BILIRUBIN URINE: NEGATIVE
Bacteria, UA: NONE SEEN
Glucose, UA: >=500 mg/dL — AB
HGB URINE DIPSTICK: NEGATIVE
KETONES UR: 20 mg/dL — AB
LEUKOCYTES UA: NEGATIVE
NITRITE: NEGATIVE
PH: 7 (ref 5.0–8.0)
PROTEIN: NEGATIVE mg/dL
SPECIFIC GRAVITY, URINE: 1.028 (ref 1.005–1.030)
Squamous Epithelial / LPF: NONE SEEN

## 2017-04-14 LAB — TSH: TSH: 0.318 u[IU]/mL — AB (ref 0.350–4.500)

## 2017-04-14 LAB — PHOSPHORUS: Phosphorus: 3.3 mg/dL (ref 2.5–4.6)

## 2017-04-14 LAB — MAGNESIUM: MAGNESIUM: 2.1 mg/dL (ref 1.7–2.4)

## 2017-04-14 LAB — TROPONIN I: Troponin I: <0.03 ng/mL (ref <0.03)

## 2017-04-14 MED ORDER — ATORVASTATIN CALCIUM 40 MG PO TABS
40.0000 mg | ORAL_TABLET | Freq: Every day | ORAL | Status: DC
  Administered 2017-04-14: 40 mg via ORAL
  Filled 2017-04-14: qty 1

## 2017-04-14 NOTE — Progress Notes (Signed)
PROGRESS NOTE  Yvonne LandLynda I Mathew  ONG:[id number] DOB: [birth year] DOA: 04/13/2017 PCP: [name], Elizabeth A, MD   Brief Narrative: Yvonne Todd is a 66 y.o. 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  Assessment & Plan: Principal Problem:   COPD with acute exacerbation (HCC) Active Problems:   Steroid-induced diabetes (HCC)   Essential hypertension   Chronic diastolic CHF (congestive heart failure) (HCC)   Cardiomegaly   Chest pressure  Acute on chronic hypoxic respiratory failure: Nocturnal oxygen 2-5LPM since discharge from hospital Sept 2017. Had negative PSG. Not primary cardiac etiology with negative troponins, nonischemic ECG. Requiring daytime oxygen currently, still not at respiratory baseline. - Treat COPD exacerbation as below - Continue supplemental oxygen, hope to wean to nocturnal-only baseline.  - RVP pending. - With cardiomegaly and chest pressure, will check echocardiogram.   COPD exacerbation:  - Steroids, bronchodilators (duoneb QID and prn), mucolytic, will need refill of spiriva at discharge. - DC levaquin as no infiltrate on CXR, no increased cough or sputum.  - Encouraged complete abstinence from tobacco  Chronic HFpEF: 2016 echo: EF 60-65%, G1DD. Weight at DC Sept 2017 was 208lbs, Feb 2018 weight 209lbs. Currently 194lbs and appearing euvolemic. - Updating echo as above - Daily weights, I/O  Steroid-induced T2DM: HbA1c Feb 2018 6.9%. No longer taking insulin due to improvement. - Check CBGs/SSI while on steroids   Essential HTN: - Continue norvasc  Hyperlipidemia: Pt on simvastatin PTA, lipid panel grossly abnormal (LDL 218).  - Will switch to high intensity statin - Recommend repeat lipid panel as outpatient  Morbid obesity: BMI 35. Suspect OHS causing nocturnal hypoxia.  - Nutrition  consult  Suppressed TSH: TSH was 0.318.  - Check T3 and T4.   DVT prophylaxis: Lovenox Code Status: Full Family Communication: None at bedside Disposition Plan: Breathing improved but not at baseline. Will continue Tx, wean O2 as able. Hopeful for DC to home in next 24 hrs.   Consultants:   None  Procedures:   Echocardiogram pending  Antimicrobials:  Levaquin 8/13   Subjective: Breathing better than at admission, but still dyspneic beyond baseline with any exertion. Chest pressure has resolved. No palpitations, N/V/D.   Objective: BP (!) 142/64 (BP Location: Right Arm)   Pulse 74   Temp 97.8 F (36.6 C) (Oral)   Resp 18   Ht 5\' 2"  (1.575 m)   Wt 88.4 kg (194 lb 12.8 oz)   SpO2 98%   BMI 35.63 kg/m   General exam: Obese 66yo female in no distress Respiratory system: Mildly labored breathing with supplemental oxygen. Distant breath sounds with expiratory wheezing diffusely. No crackles.  Cardiovascular system: Regular rate and rhythm. No murmur, rub, or gallop. No JVD, and trace pedal edema. Gastrointestinal system: Abdomen soft, non-tender, non-distended, with normoactive bowel sounds. No organomegaly or masses felt. Central nervous system: Alert and oriented. No focal neurological deficits. Extremities: Warm, no deformities Skin: No rashes, lesions no ulcers Psychiatry: Judgement and insight appear normal. Mood & affect appropriate.   Data Reviewed: I have personally reviewed following labs and imaging studies  CBC:  Recent Labs Lab 04/13/17 1513 04/14/17 0453  WBC 5.9 7.3  HGB 14.4 13.8  HCT 43.5 41.9  MCV 94.2 93.7  PLT 251 257   Basic Metabolic Panel:  Recent Labs Lab 04/13/17 1513 04/14/17 0453  NA 141 142  K 4.2 4.3  CL 102 102  CO2 31 31  GLUCOSE 172* 166*  BUN 15 14  CREATININE 0.54 0.51  CALCIUM 9.2 9.3  MG  --  2.1  PHOS  --  3.3   GFR: Estimated Creatinine Clearance: 70.5 mL/min (by C-G formula based on SCr of 0.51 mg/dL). Liver  Function Tests:  Recent Labs Lab 04/14/17 0453  AST 18  ALT 16  ALKPHOS 65  BILITOT 0.5  PROT 7.0  ALBUMIN 4.2   No results for input(s): LIPASE, AMYLASE in the last 168 hours. No results for input(s): AMMONIA in the last 168 hours. Coagulation Profile: No results for input(s): INR, PROTIME in the last 168 hours. Cardiac Enzymes:  Recent Labs Lab 04/13/17 2207 04/14/17 0453 04/14/17 1004  TROPONINI <0.03 <0.03 <0.03   BNP (last 3 results) No results for input(s): PROBNP in the last 8760 hours. HbA1C: No results for input(s): HGBA1C in the last 72 hours. CBG:  Recent Labs Lab 04/14/17 0014 04/14/17 0754 04/14/17 1143  GLUCAP 310* 260* 272*   Lipid Profile:  Recent Labs  04/14/17 0453  CHOL 311*  HDL 45  LDLCALC 218*  TRIG 238*  CHOLHDL 6.9   Thyroid Function Tests:  Recent Labs  04/14/17 0453  TSH 0.318*   Anemia Panel: No results for input(s): VITAMINB12, FOLATE, FERRITIN, TIBC, IRON, RETICCTPCT in the last 72 hours. Urine analysis:    Component Value Date/Time   COLORURINE STRAW (A) 04/14/2017 0019   APPEARANCEUR CLEAR 04/14/2017 0019   LABSPEC 1.028 04/14/2017 0019   PHURINE 7.0 04/14/2017 0019   GLUCOSEU >=500 (A) 04/14/2017 0019   HGBUR NEGATIVE 04/14/2017 0019   BILIRUBINUR NEGATIVE 04/14/2017 0019   KETONESUR 20 (A) 04/14/2017 0019   PROTEINUR NEGATIVE 04/14/2017 0019   UROBILINOGEN 0.2 06/23/2014 1222   NITRITE NEGATIVE 04/14/2017 0019   LEUKOCYTESUR NEGATIVE 04/14/2017 0019   No results found for this or any previous visit (from the past 240 hour(s)).    Radiology Studies: Dg Chest 2 View  Result Date: 04/13/2017 CLINICAL DATA:  Shortness of breath for 3 days. EXAM: CHEST  2 VIEW COMPARISON:  Chest radiograph May 26, 2016 FINDINGS: Cardiac silhouette is upper limits of normal in size, mediastinal silhouette is nonsuspicious. Calcified aortic knob. No pleural effusions or focal consolidations. Pulmonary vasculature appears  normal. Trachea projects midline and there is no pneumothorax. Soft tissue planes and included osseous structures are non-suspicious. Mild kyphosis. 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 IMPRESSION: Borderline cardiomegaly.  No acute pulmonary process. Aortic Atherosclerosis (ICD10-I70.0). Electronically Signed   By: Awilda Metro M.D.   On: 04/13/2017 15:33    Scheduled Meds: . amLODipine  10 mg Oral Daily  . aspirin EC  81 mg Oral Daily  . atorvastatin  40 mg Oral q1800  . enoxaparin (LOVENOX) injection  40 mg Subcutaneous Q24H  . guaiFENesin  600 mg Oral BID  . insulin aspart  0-5 Units Subcutaneous QHS  . insulin aspart  0-9 Units Subcutaneous TID WC  . ipratropium-albuterol  3 mL Nebulization QID  . methylPREDNISolone (SOLU-MEDROL) injection  60 mg Intravenous Q12H  . sodium chloride flush  3 mL Intravenous Q12H   Continuous Infusions: . sodium chloride 10 mL/hr at 04/13/17 1914  . sodium chloride       LOS: 1 day   Time spent: 25 minutes.  Hazeline Junker, MD [facility] Pager [phone]  If 7PM-7AM, please contact night-coverage [email or web address] Password [facility] 04/14/2017, 2:32 PM

## 2017-04-14 NOTE — Care Management Note (Signed)
Case Management Note  Patient Details  Name: Yvonne Todd MRN: [id number] Date of Birth: 1951/07/22  Subjective/Objective: 66 y/o f admitted w/Essential htn. From home. Active w/[facility] for home 02 HS. Patient states she has home 02 tank @ home. [facility]-she plans to drive home @ d/c. CM referral for meds asst w/affording meds. Upon review-noted financial counselor has added medicaid coverage 04/14/17. Patient is obligated to her co pay $3-17-informed patient that there is no fund or resource to pay co pays. Patient voiced understanding. Informed patient that she has a case worker-DSS will be able to asst further w/additional resources-patient voiced understanding. No further CM needs.                   Action/Plan:d/c plan home.   Expected Discharge Date:   (unknown)               Expected Discharge Plan:  Home/Self Care  In-House Referral:     Discharge planning Services  CM Consult, Medication Assistance  Post Acute Care Choice:  Durable Medical Equipment (Active-[employer] home 02 HS) Choice offered to:     DME Arranged:    DME Agency:     HH Arranged:    HH Agency:     Status of Service:  In process, will continue to follow  If discussed at Long Length of Stay Meetings, dates discussed:    Additional Comments:  Yvonne Todd, Yvonne Lingle, RN 04/14/2017, 3:32 PM

## 2017-04-14 NOTE — Progress Notes (Signed)
Nutrition Brief Note  RD consulted via COPD Gold protocol.  Pt in room eating lunch of chicken, broccoli, and salad. Pt states she has good appetite now. PTA she had poor appetite d/t the heat. States she was eating only 1 meal a day.  Weight is up per patient. UBW is 175 lb.    Wt Readings from Last 15 Encounters:  04/13/17 194 lb 12.8 oz (88.4 kg)  01/30/17 175 lb (79.4 kg)  10/27/16 209 lb (94.8 kg)  07/01/16 210 lb (95.3 kg)  05/31/16 208 lb 15.9 oz (94.8 kg)  09/03/15 175 lb (79.4 kg)  07/30/15 199 lb 1.9 oz (90.3 kg)  07/03/15 194 lb (88 kg)  07/02/15 194 lb (88 kg)  06/21/15 198 lb 3.2 oz (89.9 kg)  02/21/15 189 lb 6.4 oz (85.9 kg)  07/14/14 180 lb (81.6 kg)  07/07/14 180 lb (81.6 kg)  07/03/14 178 lb (80.7 kg)  06/23/14 178 lb 9.2 oz (81 kg)    Body mass index is 35.63 kg/m. Patient meets criteria for obesity based on current BMI.   Current diet order is CHO modified, patient is consuming approximately 90% of meals at this time. Labs and medications reviewed.   No nutrition interventions warranted at this time. If nutrition issues arise, please consult RD.   Tilda FrancoLindsey Qamar Aughenbaugh, MS, RD, LDN Pager: [phone] After Hours Pager: [phone]

## 2017-04-15 ENCOUNTER — Inpatient Hospital Stay (HOSPITAL_COMMUNITY): Payer: Medicare Other

## 2017-04-15 LAB — GLUCOSE, CAPILLARY
GLUCOSE-CAPILLARY: 335 mg/dL — AB (ref 65–99)
Glucose-Capillary: 226 mg/dL — ABNORMAL HIGH (ref 65–99)

## 2017-04-15 LAB — RESPIRATORY PANEL BY PCR
ADENOVIRUS-RVPPCR: NOT DETECTED
Bordetella pertussis: NOT DETECTED
CHLAMYDOPHILA PNEUMONIAE-RVPPCR: NOT DETECTED
CORONAVIRUS 229E-RVPPCR: NOT DETECTED
CORONAVIRUS OC43-RVPPCR: NOT DETECTED
Coronavirus HKU1: NOT DETECTED
Coronavirus NL63: NOT DETECTED
INFLUENZA A H1 2009-RVPPR: NOT DETECTED
INFLUENZA A H1-RVPPCR: NOT DETECTED
INFLUENZA A-RVPPCR: NOT DETECTED
INFLUENZA B-RVPPCR: NOT DETECTED
Influenza A H3: NOT DETECTED
MYCOPLASMA PNEUMONIAE-RVPPCR: NOT DETECTED
Metapneumovirus: NOT DETECTED
PARAINFLUENZA VIRUS 1-RVPPCR: NOT DETECTED
Parainfluenza Virus 2: NOT DETECTED
Parainfluenza Virus 3: NOT DETECTED
Parainfluenza Virus 4: NOT DETECTED
RESPIRATORY SYNCYTIAL VIRUS-RVPPCR: NOT DETECTED
Rhinovirus / Enterovirus: NOT DETECTED

## 2017-04-15 LAB — T4, FREE: Free T4: 0.78 ng/dL (ref 0.61–1.12)

## 2017-04-15 MED ORDER — AMLODIPINE BESYLATE 10 MG PO TABS: 10.0000 mg | ORAL_TABLET | Freq: Every day | ORAL | 0 refills | Status: DC

## 2017-04-15 MED ORDER — PREDNISONE 20 MG PO TABS: 40.0000 mg | ORAL_TABLET | Freq: Every day | ORAL | 0 refills | Status: DC

## 2017-04-15 MED ORDER — ALBUTEROL SULFATE HFA 108 (90 BASE) MCG/ACT IN AERS: 2.0000 | INHALATION_SPRAY | Freq: Four times a day (QID) | RESPIRATORY_TRACT | 0 refills | Status: DC | PRN

## 2017-04-15 MED ORDER — ALBUTEROL SULFATE (2.5 MG/3ML) 0.083% IN NEBU: 2.5000 mg | INHALATION_SOLUTION | RESPIRATORY_TRACT | 0 refills | Status: DC | PRN

## 2017-04-15 MED ORDER — FLUTICASONE PROPIONATE 50 MCG/ACT NA SUSP: 2.0000 | Freq: Every day | NASAL | 0 refills | Status: DC

## 2017-04-15 MED ORDER — ATORVASTATIN CALCIUM 40 MG PO TABS: 40.0000 mg | ORAL_TABLET | Freq: Every day | ORAL | 0 refills | Status: DC

## 2017-04-15 MED ORDER — TIOTROPIUM BROMIDE MONOHYDRATE 18 MCG IN CAPS: 18.0000 ug | ORAL_CAPSULE | Freq: Every day | RESPIRATORY_TRACT | 0 refills | Status: DC

## 2017-04-15 MED ORDER — IPRATROPIUM-ALBUTEROL 0.5-2.5 (3) MG/3ML IN SOLN: 3.0000 mL | Freq: Two times a day (BID) | RESPIRATORY_TRACT | Status: DC

## 2017-04-15 NOTE — Discharge Summary (Signed)
Physician Discharge Summary  [name]:[id number] DOB: 1951/04/09 DOA: 04/13/2017  PCP: Myrlene Broker, MD  Admit date: 04/13/2017 Discharge date: 04/15/2017  Admitted From: Home Disposition: Home   Recommendations for Outpatient Follow-up:  1. Follow up with PCP in 1-2 weeks  Home Health: None Equipment/Devices: None Discharge Condition: Stable CODE STATUS: Full Diet recommendation: Carbohydrate modified  Brief/Interim Summary: Yvonne Todd is a 66 y.o. 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 AECOPD treatment was initiated with continued improvement. ACS was ruled out with nonischemic ECG, negative troponins, and resolution of chest pain.   Discharge Diagnoses:  Principal Problem:   COPD with acute exacerbation [facility]) Active Problems:   Steroid-induced diabetes (HCC)   Essential hypertension   Chronic diastolic CHF (congestive heart failure) (HCC)   Cardiomegaly   Chest pressure  Acute on chronic hypoxic respiratory failure: Nocturnal oxygen 2-5LPM since discharge from hospital Sept 2017. Had negative PSG. Not primary cardiac etiology with negative troponins, nonischemic ECG. Requiring daytime oxygen currently, still not at respiratory baseline. - Treat COPD exacerbation as below - Weaned oxygen to off.  - No clinical evidence of CHF and no further chest pain after treatment of COPD exacerbation, troponins negative, ECG nonischemic. Will cancel echo.   COPD exacerbation:  - Continue prednisone, no indication for antibiotic. - Refilled spiriva, and albuterol MDI and neb. - Encouraged complete abstinence from tobacco  Chronic HFpEF: 2016 echo: EF 60-65%, G1DD. Weight at DC Sept 2017 was 208lbs, Feb 2018 weight 209lbs. Currently 194lbs and appearing euvolemic.  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 No longer taking insulin due to improvement. - Recommended restarting home insulin sliding scale until follow up with her PCP given hyperglycemia here. She has insulin and functional glucometer at home.   Essential HTN: - Follow up with PCP  Hyperlipidemia: Pt on simvastatin PTA, lipid panel grossly abnormal (LDL 218).  - Will switch to high intensity statin - Recommend repeat lipid panel as outpatient  Morbid obesity: BMI 35. Suspect OHS causing nocturnal hypoxia.  - Nutrition consult  Suppressed TSH: TSH was 0.318. T4 normal at 0.78 - T3 pending at discharge.   Refilled all medications as pt was out of them. Will need PCP follow up. Got care management consult to help with resources.   Discharge Instructions Discharge Instructions    Discharge instructions    Complete by:  As directed    You were admitted for COPD exacerbation and have improved.  - Continue taking medications. Your home medications have been re-prescribed (sent to your [facility] pharmacy). It is important for you to take medications as directed.  - Your cholesterol was very elevated, so simvastatin was discontinued and will be replaced with atorvastatin (a stronger cholesterol-lowering medication). You will need PCP follow up to monitor your cholesterol. Try to avoid saturated fats in your diet.  - Take prednisone 40mg  every morning for the next 4 mornings.  - Follow up with your PCP in the next 1 - 2 weeks, or seek medical attention sooner if you experience worsening of symptoms.     Allergies as of 04/15/2017      Reactions   Fish Allergy Rash, Other (See Comments)   Some types of fish with iodine causes rash   Iodine Rash   Penicillins Rash, Other (See Comments)   Has patient had a  PCN reaction causing immediate rash, facial/tongue/throat swelling, SOB or lightheadedness with hypotension: yes Has patient had a PCN reaction causing severe rash involving mucus membranes or skin necrosis: no Has  patient had a PCN reaction that required hospitalization: no Has patient had a PCN reaction occurring within the last 10 years: no If all of the above answers are "NO", then may proceed with Cephalosporin use.   Tetracyclines & Related Rash      Medication List    STOP taking these medications   glipiZIDE 10 MG tablet Commonly known as:  GLUCOTROL   HUMALOG KWIKPEN 100 UNIT/ML KiwkPen Generic drug:  insulin lispro   HYDROcodone-acetaminophen 5-325 MG tablet Commonly known as:  NORCO   Insulin Glargine 100 UNIT/ML Solostar Pen Commonly known as:  LANTUS SOLOSTAR   simvastatin 20 MG tablet Commonly known as:  ZOCOR     TAKE these medications   albuterol (2.5 MG/3ML) 0.083% nebulizer solution Commonly known as:  PROVENTIL Take 3 mLs (2.5 mg total) by nebulization every 2 (two) hours as needed for wheezing.   albuterol 108 (90 Base) MCG/ACT inhaler Commonly known as:  PROVENTIL HFA;VENTOLIN HFA Inhale 2 puffs into the lungs every 6 (six) hours as needed for wheezing or shortness of breath.   amLODipine 10 MG tablet Commonly known as:  NORVASC Take 1 tablet (10 mg total) by mouth daily.   aspirin EC 81 MG tablet Take 81 mg by mouth daily.   atorvastatin 40 MG tablet Commonly known as:  LIPITOR Take 1 tablet (40 mg total) by mouth at bedtime.   diphenhydrAMINE 25 MG tablet Commonly known as:  BENADRYL Take 50 mg by mouth at bedtime.   diphenhydramine-acetaminophen 25-500 MG Tabs tablet Commonly known as:  TYLENOL PM Take 2 tablets by mouth at bedtime as needed (For pain.).   fluticasone 50 MCG/ACT nasal spray Commonly known as:  FLONASE Place 2 sprays into both nostrils daily.   guaiFENesin 600 MG 12 hr tablet Commonly known as:  MUCINEX Take 1 tablet (600 mg total) by mouth 2 (two) times daily. What changed:  when to take this  reasons to take this   ibuprofen 200 MG tablet Commonly known as:  ADVIL,MOTRIN Take 200 mg by mouth every 6 (six) hours as  needed for moderate pain.   Melatonin 5 MG Tabs Take 5 mg by mouth at bedtime.   naproxen 500 MG tablet Commonly known as:  NAPROSYN Take 1 tablet (500 mg total) by mouth 2 (two) times daily as needed for mild pain, moderate pain or headache (TAKE WITH MEALS.).   nitroGLYCERIN 0.4 MG SL tablet Commonly known as:  NITROSTAT Place 1 tablet (0.4 mg total) under the tongue every 5 (five) minutes as needed for chest pain.   OVER THE COUNTER MEDICATION Take 2 capsules by mouth 2 (two) times daily. Lemon Balm   polyvinyl alcohol 1.4 % ophthalmic solution Commonly known as:  LIQUIFILM TEARS Place 2 drops into both eyes as needed for dry eyes.   predniSONE 20 MG tablet Commonly known as:  DELTASONE Take 2 tablets (40 mg total) by mouth daily with breakfast.   tiotropium 18 MCG inhalation capsule Commonly known as:  SPIRIVA HANDIHALER Place 1 capsule (18 mcg total) into inhaler and inhale daily. What changed:  when to take this      Follow-up Information    Myrlene Broker, MD. Schedule an appointment as soon as possible for a visit in 2 week(s).   Specialty:  Internal Medicine Contact  information: [address] [place] [address] [phone]          Allergies  Allergen Reactions  . Fish Allergy Rash and Other (See Comments)    Some types of fish with iodine causes rash  . Iodine Rash  . Penicillins Rash and Other (See Comments)    Has patient had a PCN reaction causing immediate rash, facial/tongue/throat swelling, SOB or lightheadedness with hypotension: yes Has patient had a PCN reaction causing severe rash involving mucus membranes or skin necrosis: no Has patient had a PCN reaction that required hospitalization: no Has patient had a PCN reaction occurring within the last 10 years: no If all of the above answers are "NO", then may proceed with Cephalosporin use.   . Tetracyclines & Related Rash    Consultations:  None  Procedures/Studies: Dg  Chest 2 View  Result Date: 04/13/2017 CLINICAL DATA:  Shortness of breath for 3 days. EXAM: CHEST  2 VIEW COMPARISON:  Chest radiograph May 26, 2016 FINDINGS: Cardiac silhouette is upper limits of normal in size, mediastinal silhouette is nonsuspicious. Calcified aortic knob. No pleural effusions or focal consolidations. Pulmonary vasculature appears normal. Trachea projects midline and there is no pneumothorax. Soft tissue planes and included osseous structures are non-suspicious. Mild kyphosis. 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 IMPRESSION: Borderline cardiomegaly.  No acute pulmonary process. Aortic Atherosclerosis (ICD10-I70.0). Electronically Signed   By: Awilda Metro M.D.   On: 04/13/2017 15:33   Subjective: Feels breathing is back at baseline. No chest pressure since admission.   Discharge Exam: Vitals:   04/15/17 0559 04/15/17 0744  BP: (!) 144/61   Pulse: (!) 56   Resp: 20   Temp: 98.6 F (37 C)   SpO2: 100% 97%   General: Pt is alert, awake, not in acute distress Cardiovascular: RRR, no murmur or JVD. Respiratory: Nonlabored on room air. Mildly diminished without wheezing bilaterally.  Abdominal: Soft, NT, ND, bowel sounds + Extremities: No edema, no cyanosis  Labs: Basic Metabolic Panel:  Recent Labs Lab 04/13/17 1513 04/14/17 0453  NA 141 142  K 4.2 4.3  CL 102 102  CO2 31 31  GLUCOSE 172* 166*  BUN 15 14  CREATININE 0.54 0.51  CALCIUM 9.2 9.3  MG  --  2.1  PHOS  --  3.3   Liver Function Tests:  Recent Labs Lab 04/14/17 0453  AST 18  ALT 16  ALKPHOS 65  BILITOT 0.5  PROT 7.0  ALBUMIN 4.2   CBC:  Recent Labs Lab 04/13/17 1513 04/14/17 0453  WBC 5.9 7.3  HGB 14.4 13.8  HCT 43.5 41.9  MCV 94.2 93.7  PLT 251 257   Cardiac Enzymes:  Recent Labs Lab 04/13/17 2207 04/14/17 0453 04/14/17 1004  TROPONINI <0.03 <0.03 <0.03   CBG:  Recent Labs Lab 04/14/17 1143 04/14/17 1659 04/14/17 2154  04/15/17 0804 04/15/17 1212  GLUCAP 272* 210* 349* 226* 335*   Lipid Profile  Recent Labs  04/14/17 0453  CHOL 311*  HDL 45  LDLCALC 218*  TRIG 238*  CHOLHDL 6.9   Thyroid function studies  Recent Labs  04/14/17 0453  TSH 0.318*   Urinalysis    Component Value Date/Time   COLORURINE STRAW (A) 04/14/2017 0019   APPEARANCEUR CLEAR 04/14/2017 0019   LABSPEC 1.028 04/14/2017 0019   PHURINE 7.0 04/14/2017 0019   GLUCOSEU >=500 (A) 04/14/2017 0019   HGBUR NEGATIVE 04/14/2017 0019   BILIRUBINUR NEGATIVE 04/14/2017 0019   KETONESUR 20 (A)  04/14/2017 0019   PROTEINUR NEGATIVE 04/14/2017 0019   UROBILINOGEN 0.2 06/23/2014 1222   NITRITE NEGATIVE 04/14/2017 0019   LEUKOCYTESUR NEGATIVE 04/14/2017 0019    Time coordinating discharge: Approximately 40 minutes  Hazeline Junkeryan Reola Buckles, MD  [facility] 04/15/2017, 3:19 PM Pager [phone]

## 2017-04-15 NOTE — Progress Notes (Signed)
Went over discharge paperwork with patient.  All questions answered.  Prescriptions given.  VSS.  Pt wheeled out to car.

## 2017-04-15 NOTE — Progress Notes (Signed)
Pt ambulated in hall on RA sating from 91-94.  Pt tolerated fair.

## 2017-04-15 NOTE — Care Management Note (Signed)
Case Management Note  Patient Details  Name: Virgia LandLynda I Hopman MRN: [id number] Date of Birth: April 17, 1951  Subjective/Objective:    MATCH program utilized, & given form to patient-informed patient of $3 co pay(can afford)/1x Risk manageruse/select pharmacies.Patient voiced understanding. No further CM needs.                 Action/Plan:d/c home.   Expected Discharge Date:  04/15/17               Expected Discharge Plan:  Home/Self Care  In-House Referral:     Discharge planning Services  CM Consult, Medication Assistance, MATCH Program  Post Acute Care Choice:  Durable Medical Equipment (Active-[employer] home 02 HS) Choice offered to:     DME Arranged:    DME Agency:     HH Arranged:    HH Agency:     Status of Service:  Completed, signed off  If discussed at [employer] Length of Stay Meetings, dates discussed:    Additional Comments:  Lanier ClamMahabir, Andersyn Fragoso, RN 04/15/2017, 1:22 PM

## 2017-04-16 LAB — BASIC METABOLIC PANEL: Glucose: 175

## 2017-04-16 LAB — HEMOGLOBIN A1C
Hgb A1c MFr Bld: 7.1 % — ABNORMAL HIGH (ref 4.8–5.6)
Mean Plasma Glucose: 157 mg/dL

## 2017-04-16 LAB — T3, FREE: T3, Free: 1.9 pg/mL — ABNORMAL LOW (ref 2.0–4.4)

## 2017-04-17 LAB — BASIC METABOLIC PANEL: GLUCOSE: 145

## 2017-04-18 LAB — BASIC METABOLIC PANEL: Glucose: 125

## 2017-04-19 LAB — BASIC METABOLIC PANEL: Glucose: 171

## 2017-04-20 LAB — BASIC METABOLIC PANEL: GLUCOSE: 177

## 2017-04-21 ENCOUNTER — Inpatient Hospital Stay: Payer: Medicare Other | Admitting: Nurse Practitioner

## 2017-04-21 LAB — BASIC METABOLIC PANEL: GLUCOSE: 164

## 2017-04-22 LAB — BASIC METABOLIC PANEL: Glucose: 154

## 2017-04-23 LAB — BASIC METABOLIC PANEL: Glucose: 192

## 2017-04-24 LAB — BASIC METABOLIC PANEL: GLUCOSE: 189

## 2017-04-25 LAB — BASIC METABOLIC PANEL: Glucose: 210

## 2017-04-26 LAB — BASIC METABOLIC PANEL: Glucose: 179

## 2017-04-27 LAB — BASIC METABOLIC PANEL: Glucose: 246

## 2017-04-28 ENCOUNTER — Ambulatory Visit (INDEPENDENT_AMBULATORY_CARE_PROVIDER_SITE_OTHER): Payer: Medicare Other | Admitting: Nurse Practitioner

## 2017-04-28 ENCOUNTER — Inpatient Hospital Stay: Payer: Medicare Other | Admitting: Nurse Practitioner

## 2017-04-28 ENCOUNTER — Encounter: Payer: Self-pay | Admitting: Nurse Practitioner

## 2017-04-28 VITALS — BP 120/70 | HR 81 | Temp 98.1°F | Ht 62.0 in | Wt 196.0 lb

## 2017-04-28 DIAGNOSIS — T380X5A Adverse effect of glucocorticoids and synthetic analogues, initial encounter: Secondary | ICD-10-CM | POA: Diagnosis not present

## 2017-04-28 DIAGNOSIS — J449 Chronic obstructive pulmonary disease, unspecified: Secondary | ICD-10-CM | POA: Diagnosis not present

## 2017-04-28 DIAGNOSIS — E099 Drug or chemical induced diabetes mellitus without complications: Secondary | ICD-10-CM | POA: Diagnosis not present

## 2017-04-28 LAB — BASIC METABOLIC PANEL: GLUCOSE: 199

## 2017-04-28 MED ORDER — INSULIN GLARGINE 100 UNITS/ML SOLOSTAR PEN: 10.0000 [IU] | PEN_INJECTOR | Freq: Every day | SUBCUTANEOUS | 11 refills | Status: DC

## 2017-04-28 MED ORDER — INSULIN LISPRO 100 UNIT/ML (KWIKPEN): PEN_INJECTOR | SUBCUTANEOUS | 11 refills | Status: DC

## 2017-04-28 MED ORDER — GLIPIZIDE 5 MG PO TABS: 5.0000 mg | ORAL_TABLET | Freq: Two times a day (BID) | ORAL | 3 refills | Status: DC

## 2017-04-28 MED ORDER — INSULIN GLARGINE 100 UNITS/ML SOLOSTAR PEN: 10.0000 [IU] | PEN_INJECTOR | Freq: Every day | SUBCUTANEOUS | 6 refills | Status: DC

## 2017-04-28 NOTE — Progress Notes (Signed)
Subjective:  Patient ID: Yvonne Todd, female    DOB: Jul 18, 1951  Age: 66 y.o. MRN: [id number]  CC: Hospitalization Follow-up (hospitla follow up from asthma---blodd sugar is high---got put on steroid--insulin consult?)   HPI  DM: Reports home glucose reading: AM: 130-200 Noon: 170-350 Evening: 200-400 Bedtime: 140-300. No hypoglycemia. Maintains low sugar and low carb diet. Reports adequate oral hydration  COPD: Improved. Completed oral prednisone as prescribed. Use of oxygen at night.  Outpatient Medications Prior to Visit  Medication Sig Dispense Refill  . albuterol (PROVENTIL HFA;VENTOLIN HFA) 108 (90 Base) MCG/ACT inhaler Inhale 2 puffs into the lungs every 6 (six) hours as needed for wheezing or shortness of breath. 1 Inhaler 0  . albuterol (PROVENTIL) (2.5 MG/3ML) 0.083% nebulizer solution Take 3 mLs (2.5 mg total) by nebulization every 2 (two) hours as needed for wheezing. 75 mL 0  . amLODipine (NORVASC) 10 MG tablet Take 1 tablet (10 mg total) by mouth daily. 30 tablet 0  . aspirin EC 81 MG tablet Take 81 mg by mouth daily.    Marland Kitchen atorvastatin (LIPITOR) 40 MG tablet Take 1 tablet (40 mg total) by mouth at bedtime. 30 tablet 0  . diphenhydrAMINE (BENADRYL) 25 MG tablet Take 50 mg by mouth at bedtime.    . diphenhydramine-acetaminophen (TYLENOL PM) 25-500 MG TABS tablet Take 2 tablets by mouth at bedtime as needed (For pain.).    Marland Kitchen fluticasone (FLONASE) 50 MCG/ACT nasal spray Place 2 sprays into both nostrils daily. 16 g 0  . guaiFENesin (MUCINEX) 600 MG 12 hr tablet Take 1 tablet (600 mg total) by mouth 2 (two) times daily. (Patient taking differently: Take 600 mg by mouth 2 (two) times daily as needed for cough or to loosen phlegm. ) 40 tablet 0  . ibuprofen (ADVIL,MOTRIN) 200 MG tablet Take 200 mg by mouth every 6 (six) hours as needed for moderate pain.    . Melatonin 5 MG TABS Take 5 mg by mouth at bedtime.     . naproxen (NAPROSYN) 500 MG tablet Take 1 tablet (500 mg  total) by mouth 2 (two) times daily as needed for mild pain, moderate pain or headache (TAKE WITH MEALS.). 20 tablet 0  . OVER THE COUNTER MEDICATION Take 2 capsules by mouth 2 (two) times daily. Lemon Balm    . polyvinyl alcohol (LIQUIFILM TEARS) 1.4 % ophthalmic solution Place 2 drops into both eyes as needed for dry eyes.     . predniSONE (DELTASONE) 20 MG tablet Take 2 tablets (40 mg total) by mouth daily with breakfast. 8 tablet 0  . tiotropium (SPIRIVA HANDIHALER) 18 MCG inhalation capsule Place 1 capsule (18 mcg total) into inhaler and inhale daily. 30 capsule 0  . nitroGLYCERIN (NITROSTAT) 0.4 MG SL tablet Place 1 tablet (0.4 mg total) under the tongue every 5 (five) minutes as needed for chest pain. (Patient not taking: Reported on 04/28/2017) 30 tablet 5   No facility-administered medications prior to visit.     ROS See HPI  Objective:  BP 120/70   Pulse 81   Temp 98.1 F (36.7 C)   Ht 5\' 2"  (1.575 m)   Wt 196 lb (88.9 kg)   SpO2 94%   BMI 35.85 kg/m   BP Readings from Last 3 Encounters:  04/28/17 120/70  04/15/17 (!) 144/61  01/30/17 (!) 154/70    Wt Readings from Last 3 Encounters:  04/28/17 196 lb (88.9 kg)  04/15/17 192 lb 14.4 oz (87.5 kg)  01/30/17 175  lb (79.4 kg)    Physical Exam  Constitutional: She is oriented to person, place, and time. No distress.  Eyes: No scleral icterus.  Cardiovascular: Normal rate and regular rhythm.   Pulmonary/Chest: Effort normal and breath sounds normal. She has no wheezes. She has no rales.  Musculoskeletal: She exhibits no edema.  Neurological: She is alert and oriented to person, place, and time.  Skin: Skin is warm and dry.  Vitals reviewed.   Lab Results  Component Value Date   WBC 7.3 04/14/2017   HGB 13.8 04/14/2017   HCT 41.9 04/14/2017   PLT 257 04/14/2017   GLUCOSE 166 (H) 04/14/2017   CHOL 311 (H) 04/14/2017   TRIG 238 (H) 04/14/2017   HDL 45 04/14/2017   LDLCALC 218 (H) 04/14/2017   ALT 16 04/14/2017    AST 18 04/14/2017   NA 142 04/14/2017   K 4.3 04/14/2017   CL 102 04/14/2017   CREATININE 0.51 04/14/2017   BUN 14 04/14/2017   CO2 31 04/14/2017   TSH 0.318 (L) 04/14/2017   INR 0.94 06/23/2014   HGBA1C 7.1 (H) 04/15/2017    Dg Chest 2 View  Result Date: 04/13/2017 CLINICAL DATA:  Shortness of breath for 3 days. EXAM: CHEST  2 VIEW COMPARISON:  Chest radiograph May 26, 2016 FINDINGS: Cardiac silhouette is upper limits of normal in size, mediastinal silhouette is nonsuspicious. Calcified aortic knob. No pleural effusions or focal consolidations. Pulmonary vasculature appears normal. Trachea projects midline and there is no pneumothorax. Soft tissue planes and included osseous structures are non-suspicious. Mild kyphosis. 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 IMPRESSION: Borderline cardiomegaly.  No acute pulmonary process. Aortic Atherosclerosis (ICD10-I70.0). Electronically Signed   By: Awilda Metro M.D.   On: 04/13/2017 15:33    Assessment & Plan:   [place] was seen today for hospitalization follow-up.  Diagnoses and all orders for this visit:  Steroid-induced diabetes (HCC) -     glipiZIDE (GLUCOTROL) 5 MG tablet; Take 1 tablet (5 mg total) by mouth 2 (two) times daily before a meal. -     Discontinue: insulin lispro (HUMALOG KWIKPEN) 100 UNIT/ML KiwkPen; Use TID AC and HS SSI: 150-199 2units, 200-249 4units, 250-299 6units, 300-249 8units, >350 10units and call office -     Discontinue: insulin glargine (LANTUS) 100 unit/mL SOPN; Inject 0.1 mLs (10 Units total) into the skin at bedtime. -     insulin glargine (LANTUS) 100 unit/mL SOPN; Inject 0.1 mLs (10 Units total) into the skin at bedtime. -     insulin lispro (HUMALOG KWIKPEN) 100 UNIT/ML KiwkPen; Use TID AC and HS SSI: 150-199 2units, 200-249 4units, 250-299 6units, 300-249 8units, >350 10units and call office  COPD (chronic obstructive pulmonary disease) with chronic bronchitis  (HCC)   I have discontinued Ms. Corpus's insulin glargine. I am also having her start on glipiZIDE and insulin glargine. Additionally, I am having her maintain her nitroGLYCERIN, diphenhydrAMINE, Melatonin, guaiFENesin, diphenhydramine-acetaminophen, aspirin EC, polyvinyl alcohol, OVER THE COUNTER MEDICATION, naproxen, ibuprofen, albuterol, amLODipine, atorvastatin, fluticasone, tiotropium, predniSONE, albuterol, and insulin lispro.  Meds ordered this encounter  Medications  . glipiZIDE (GLUCOTROL) 5 MG tablet    Sig: Take 1 tablet (5 mg total) by mouth 2 (two) times daily before a meal.    Dispense:  60 tablet    Refill:  3    Order Specific Question:   Supervising Provider    Answer:   Tresa Garter [[id number]]  . DISCONTD: insulin lispro (HUMALOG KWIKPEN)  100 UNIT/ML KiwkPen    Sig: Use TID AC and HS SSI: 150-199 2units, 200-249 4units, 250-299 6units, 300-249 8units, >350 10units and call office    Dispense:  15 mL    Refill:  11    Order Specific Question:   Supervising Provider    Answer:   Tresa Garter [[id number]]  . DISCONTD: insulin glargine (LANTUS) 100 unit/mL SOPN    Sig: Inject 0.1 mLs (10 Units total) into the skin at bedtime.    Dispense:  3 pen    Refill:  6    Order Specific Question:   Supervising Provider    Answer:   Tresa Garter [[id number]]  . insulin glargine (LANTUS) 100 unit/mL SOPN    Sig: Inject 0.1 mLs (10 Units total) into the skin at bedtime.    Dispense:  15 mL    Refill:  11    Order Specific Question:   Supervising Provider    Answer:   Tresa Garter [[id number]]  . insulin lispro (HUMALOG KWIKPEN) 100 UNIT/ML KiwkPen    Sig: Use TID AC and HS SSI: 150-199 2units, 200-249 4units, 250-299 6units, 300-249 8units, >350 10units and call office    Dispense:  15 mL    Refill:  11    Order Specific Question:   Supervising Provider    Answer:   Tresa Garter [[id number]]    Follow-up: Return in about 4 weeks (around 05/26/2017) for DM and HTN,  hyperlipidemia with Dr. Okey Dupre.  Alysia Penna, NP

## 2017-04-28 NOTE — Patient Instructions (Signed)
Hypoglycemia Hypoglycemia is when the sugar (glucose) level in the blood is too low. Symptoms of low blood sugar may include:  Feeling: ? Hungry. ? Worried or nervous (anxious). ? Sweaty and clammy. ? Confused. ? Dizzy. ? Sleepy. ? Sick to your stomach (nauseous).  Having: ? A fast heartbeat. ? A headache. ? A change in your vision. ? Jerky movements that you cannot control (seizure). ? Nightmares. ? Tingling or no feeling (numbness) around the mouth, lips, or tongue.  Having trouble with: ? Talking. ? Paying attention (concentrating). ? Moving (coordination). ? Sleeping.  Shaking.  Passing out (fainting).  Getting upset easily (irritability).  Low blood sugar can happen to people who have diabetes and people who do not have diabetes. Low blood sugar can happen quickly, and it can be an emergency. Treating Low Blood Sugar Low blood sugar is often treated by eating or drinking something sugary right away. If you can think clearly and swallow safely, follow the 15:15 rule:  Take 15 grams of a fast-acting carb (carbohydrate). Some fast-acting carbs are: ? 1 tube of glucose gel. ? 3 sugar tablets (glucose pills). ? 6-8 pieces of hard candy. ? 4 oz (120 mL) of fruit juice. ? 4 oz (120 mL) of regular (not diet) soda.  Check your blood sugar 15 minutes after you take the carb.  If your blood sugar is still at or below 70 mg/dL (3.9 mmol/L), take 15 grams of a carb again.  If your blood sugar does not go above 70 mg/dL (3.9 mmol/L) after 3 tries, get help right away.  After your blood sugar goes back to normal, eat a meal or a snack within 1 hour.  Treating Very Low Blood Sugar If your blood sugar is at or below 54 mg/dL (3 mmol/L), you have very low blood sugar (severe hypoglycemia). This is an emergency. Do not wait to see if the symptoms will go away. Get medical help right away. Call your local emergency services (911 in the [place] Do not drive yourself to the  hospital. If you have very low blood sugar and you cannot eat or drink, you may need a glucagon shot (injection). A family member or friend should learn how to check your blood sugar and how to give you a glucagon shot. Ask your doctor if you need to have a glucagon shot kit at home. Follow these instructions at home: General instructions  Avoid any diets that cause you to not eat enough food. Talk with your doctor before you start any new diet.  Take over-the-counter and prescription medicines only as told by your doctor.  Limit alcohol to no more than 1 drink per day for nonpregnant women and 2 drinks per day for men. One drink equals 12 oz of beer, 5 oz of wine, or 1 oz of hard liquor.  Keep all follow-up visits as told by your doctor. This is important. If You Have Diabetes:   Make sure you know the symptoms of low blood sugar.  Always keep a source of sugar with you, such as: ? Sugar. ? Sugar tablets. ? Glucose gel. ? Fruit juice. ? Regular soda (not diet soda). ? Milk. ? Hard candy. ? Honey.  Take your medicines as told.  Follow your exercise and meal plan. ? Eat on time. Do not skip meals. ? Follow your sick day plan when you cannot eat or drink normally. Make this plan ahead of time with your doctor.  Check your blood sugar as often   as told by your doctor. Always check before and after exercise.  Share your diabetes care plan with: ? Your work or school. ? People you live with.  Check your pee (urine) for ketones: ? When you are sick. ? As told by your doctor.  Carry a card or wear jewelry that says you have diabetes. If You Have Low Blood Sugar From Other Causes:   Check your blood sugar as often as told by your doctor.  Follow instructions from your doctor about what you cannot eat or drink. Contact a doctor if:  You have trouble keeping your blood sugar in your target range.  You have low blood sugar often. Get help right away if:  You still have  symptoms after you eat or drink something sugary.  Your blood sugar is at or below 54 mg/dL (3 mmol/L).  You have jerky movements that you cannot control.  You pass out. These symptoms may be an emergency. Do not wait to see if the symptoms will go away. Get medical help right away. Call your local emergency services (911 in the [place] Do not drive yourself to the hospital. This information is not intended to replace advice given to you by your health care provider. Make sure you discuss any questions you have with your health care provider. Document Released: 11/12/2009 Document Revised: 01/24/2016 Document Reviewed: 09/21/2015 Elsevier Interactive Patient Education  [employer].

## 2017-04-29 ENCOUNTER — Encounter: Payer: Self-pay | Admitting: Nurse Practitioner

## 2017-04-29 NOTE — Progress Notes (Signed)
Abstracted result and sent to scan  

## 2017-05-14 ENCOUNTER — Telehealth: Payer: Self-pay | Admitting: Internal Medicine

## 2017-05-14 DIAGNOSIS — I10 Essential (primary) hypertension: Secondary | ICD-10-CM

## 2017-05-14 MED ORDER — TIOTROPIUM BROMIDE MONOHYDRATE 18 MCG IN CAPS: 18.0000 ug | ORAL_CAPSULE | Freq: Every day | RESPIRATORY_TRACT | 0 refills | Status: DC

## 2017-05-14 MED ORDER — FLUTICASONE PROPIONATE 50 MCG/ACT NA SUSP: 2.0000 | Freq: Every day | NASAL | 0 refills | Status: DC

## 2017-05-14 MED ORDER — AMLODIPINE BESYLATE 10 MG PO TABS: 10.0000 mg | ORAL_TABLET | Freq: Every day | ORAL | 0 refills | Status: DC

## 2017-05-14 MED ORDER — ATORVASTATIN CALCIUM 40 MG PO TABS: 40.0000 mg | ORAL_TABLET | Freq: Every day | ORAL | 0 refills | Status: DC

## 2017-05-14 NOTE — Telephone Encounter (Signed)
Per office policy sent 30 day to local pharmacy until appt.../lmb  

## 2017-05-14 NOTE — Telephone Encounter (Signed)
Pt needs a refill of her  amLODipine (NORVASC) 10 MG tablet atorvastatin (LIPITOR) 40 MG tablet fluticasone (FLONASE) 50 MCG/ACT nasal spray tiotropium (SPIRIVA HANDIHALER) 18 MCG inhalation capsule She has 2 days left of these medications, please advise  [facility] appt set up for 9/26 [facility] [address]

## 2017-05-27 ENCOUNTER — Encounter: Payer: Self-pay | Admitting: Internal Medicine

## 2017-05-27 ENCOUNTER — Ambulatory Visit (INDEPENDENT_AMBULATORY_CARE_PROVIDER_SITE_OTHER): Payer: Medicare Other | Admitting: Internal Medicine

## 2017-05-27 VITALS — BP 122/72 | HR 57 | Temp 97.7°F | Ht 62.0 in | Wt 193.0 lb

## 2017-05-27 DIAGNOSIS — J449 Chronic obstructive pulmonary disease, unspecified: Secondary | ICD-10-CM

## 2017-05-27 DIAGNOSIS — Z23 Encounter for immunization: Secondary | ICD-10-CM

## 2017-05-27 DIAGNOSIS — E099 Drug or chemical induced diabetes mellitus without complications: Secondary | ICD-10-CM | POA: Diagnosis not present

## 2017-05-27 DIAGNOSIS — T380X5A Adverse effect of glucocorticoids and synthetic analogues, initial encounter: Secondary | ICD-10-CM

## 2017-05-27 DIAGNOSIS — I10 Essential (primary) hypertension: Secondary | ICD-10-CM | POA: Diagnosis not present

## 2017-05-27 MED ORDER — ALBUTEROL SULFATE (2.5 MG/3ML) 0.083% IN NEBU: 2.5000 mg | INHALATION_SOLUTION | RESPIRATORY_TRACT | 6 refills | Status: DC | PRN

## 2017-05-27 MED ORDER — GLIPIZIDE 5 MG PO TABS: 5.0000 mg | ORAL_TABLET | Freq: Two times a day (BID) | ORAL | 3 refills | Status: DC

## 2017-05-27 MED ORDER — ALBUTEROL SULFATE HFA 108 (90 BASE) MCG/ACT IN AERS: 2.0000 | INHALATION_SPRAY | Freq: Four times a day (QID) | RESPIRATORY_TRACT | 6 refills | Status: DC | PRN

## 2017-05-27 MED ORDER — AMLODIPINE BESYLATE 10 MG PO TABS: 10.0000 mg | ORAL_TABLET | Freq: Every day | ORAL | 3 refills | Status: DC

## 2017-05-27 MED ORDER — FLUTICASONE PROPIONATE 50 MCG/ACT NA SUSP
2.0000 | Freq: Every day | NASAL | 11 refills | Status: DC
Start: 2017-05-27 — End: 2018-02-06

## 2017-05-27 MED ORDER — ATORVASTATIN CALCIUM 40 MG PO TABS: 40.0000 mg | ORAL_TABLET | Freq: Every day | ORAL | 3 refills | Status: DC

## 2017-05-27 MED ORDER — UMECLIDINIUM-VILANTEROL 62.5-25 MCG/INH IN AEPB: 1.0000 | INHALATION_SPRAY | Freq: Every day | RESPIRATORY_TRACT | 3 refills | Status: DC

## 2017-05-27 NOTE — Patient Instructions (Signed)
We have sent in the anoro which is an inhaler to replace spiriva. Take 1 puff daily to help with breathing all the time. Still use albuterol if needed.   We do not need labs today and have sent in the refills.   Keep using the insulin for now until the sugars come back down.

## 2017-05-29 NOTE — Progress Notes (Signed)
   Subjective:    Patient ID: Yvonne Todd, female    DOB: 03/09/1951, 66 y.o.   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 which required prednisone therapy, currently taking Lantus 10 units daily, sliding scale insulin with meals, also taking glipizide, denies low sugars, denies numbness or tingling in her feet), and her COPD (Spiriva is currently not covered by her insurance company, she has albuterol inhaler and nebulizer at home, she has finished prednisone medication however is still using albuterol more often, still on oxygen at nighttime, denies worsening cough, sputum, she is a current nonsmoker), and her hypertension(taking amlodipine with good blood pressure control, denies side effect of medication, denies chest pains, shortness of breath, headaches).  Review of Systems  Constitutional: Positive for activity change.  HENT: Negative.   Eyes: Negative.   Respiratory: Positive for shortness of breath. Negative for cough and chest tightness.   Cardiovascular: Negative for chest pain, palpitations and leg swelling.  Gastrointestinal: Negative for abdominal distention, abdominal pain, constipation, diarrhea, nausea and vomiting.  Endocrine: Negative.   Musculoskeletal: Negative.   Skin: Negative.   Neurological: Negative.   Psychiatric/Behavioral: Negative.       Objective:   Physical Exam  Constitutional: She is oriented to person, place, and time. She appears well-developed and well-nourished.  Overweight  HENT:  Head: Normocephalic and atraumatic.  Eyes: EOM are normal.  Neck: Normal range of motion.  Cardiovascular: Normal rate and regular rhythm.   Pulmonary/Chest: Effort normal and breath sounds normal. No respiratory distress. She has no wheezes. She has no rales.  Abdominal: Soft. Bowel sounds are normal. She exhibits no  distension. There is no tenderness. There is no rebound.  Musculoskeletal: She exhibits no edema.  Neurological: She is alert and oriented to person, place, and time. Coordination normal.  Skin: Skin is warm and dry.  Psychiatric: She has a normal mood and affect.   Vitals:   05/27/17 1006  BP: 122/72  Pulse: (!) 57  Temp: 97.7 F (36.5 C)  TempSrc: Oral  SpO2: 97%  Weight: 193 lb (87.5 kg)  Height:  (1.575 m)      Assessment & Plan:  Flu shot given in visit

## 2017-05-29 NOTE — Assessment & Plan Note (Signed)
She will continue on glipizide, Humalog, Lantus. No adjustment to dosing today. She is requiring less mealtime insulin now that she is off prednisone. Have asked her to monitor sugars closely and as she stops needing mealtime insulin we may need to stop her Lantus. Not complicated.

## 2017-05-29 NOTE — Assessment & Plan Note (Signed)
Taking amlodipine, BP at goal, no side effects. Continue without adjustments.

## 2017-05-29 NOTE — Assessment & Plan Note (Signed)
She continues to have nighttime hypoxia, nighttime oxygen usage. Her insurance company will not cover Spiriva so we have substituted with anoro. She also has albuterol inhaler and nebulizer for usage at home she is still continuing to use while she recovers from recent exacerbation. She does have history of severe exacerbation with concurrent pneumonia several times which showed needed hospitalization and oxygen short-term. She is a current nonsmoker and encouraged her to continue to abstain from smoking. Flu shot given at visit.

## 2017-08-19 ENCOUNTER — Ambulatory Visit (INDEPENDENT_AMBULATORY_CARE_PROVIDER_SITE_OTHER): Payer: Medicare Other | Admitting: Internal Medicine

## 2017-08-19 ENCOUNTER — Encounter: Payer: Self-pay | Admitting: Internal Medicine

## 2017-08-19 DIAGNOSIS — T380X5A Adverse effect of glucocorticoids and synthetic analogues, initial encounter: Secondary | ICD-10-CM

## 2017-08-19 DIAGNOSIS — I10 Essential (primary) hypertension: Secondary | ICD-10-CM | POA: Diagnosis not present

## 2017-08-19 DIAGNOSIS — J449 Chronic obstructive pulmonary disease, unspecified: Secondary | ICD-10-CM | POA: Diagnosis not present

## 2017-08-19 DIAGNOSIS — E099 Drug or chemical induced diabetes mellitus without complications: Secondary | ICD-10-CM

## 2017-08-19 MED ORDER — NYSTATIN-TRIAMCINOLONE 100000-0.1 UNIT/GM-% EX OINT: 1.0000 "application " | TOPICAL_OINTMENT | Freq: Two times a day (BID) | CUTANEOUS | 0 refills | Status: DC

## 2017-08-19 NOTE — Patient Instructions (Signed)
We will keep you on the anoro for the breathing.  We have sent in cream to use twice a day on the rash. If not getting better in 2-3 days call us back.

## 2017-08-19 NOTE — Progress Notes (Signed)
   Subjective:    Patient ID: Yvonne Todd, female    DOB: [birth year], 66 y.o.   MRN: [id number]  HPI The patient is a 66 YO female coming in for follow up of her COPD (using anoro and doing well, rare albuterol usage, no flares since last time and no ER visits, no home oxygen anymore) and her steroid induced diabetes (taking lantus daily and SSI with meals, usually 2-4 units per meal with rare increase given intake, she is doing well, denies low sugars since last time, still taking amaryl as well) and her blood pressure (BP running normally at home, taking amlodipine without side effects, denies chest pains or SOB or abdominal pain). Denies new concerns.   Review of Systems  Constitutional: Negative.   HENT: Negative.   Eyes: Negative.   Respiratory: Positive for cough and shortness of breath. Negative for chest tightness.   Cardiovascular: Negative for chest pain, palpitations and leg swelling.  Gastrointestinal: Negative for abdominal distention, abdominal pain, constipation, diarrhea, nausea and vomiting.  Musculoskeletal: Negative.   Skin: Negative.   Neurological: Negative.   Psychiatric/Behavioral: Negative.       Objective:   Physical Exam  Constitutional: She is oriented to person, place, and time. She appears well-developed and well-nourished.  HENT:  Head: Normocephalic and atraumatic.  Eyes: EOM are normal.  Neck: Normal range of motion.  Cardiovascular: Normal rate and regular rhythm.  Pulmonary/Chest: Effort normal and breath sounds normal. No respiratory distress. She has no wheezes. She has no rales.  Abdominal: Soft. Bowel sounds are normal. She exhibits no distension. There is no tenderness. There is no rebound.  Musculoskeletal: She exhibits no edema.  Neurological: She is alert and oriented to person, place, and time. Coordination normal.  Skin: Skin is warm and dry.  Psychiatric: She has a normal mood and affect.   Vitals:   08/19/17 1020  BP: (!) 150/80    Pulse: 60  Temp: 97.8 F (36.6 C)  TempSrc: Oral  SpO2: 98%  Weight: 196 lb (88.9 kg)  Height: 5\' 2"  (1.575 m)      Assessment & Plan:

## 2017-08-21 ENCOUNTER — Encounter: Payer: Self-pay | Admitting: Internal Medicine

## 2017-08-21 NOTE — Assessment & Plan Note (Signed)
BP mildly elevated today but she states normal at home. Currently taking amlodipine and at goal. If needs increase would add ACE-I given concurrent diabetes.

## 2017-08-21 NOTE — Assessment & Plan Note (Signed)
Doing well with lantus and humalog and amaryl. Denies lows. Has a skin rash with yeast and rx for nystatin/triamcinolone cream.

## 2017-08-21 NOTE — Assessment & Plan Note (Signed)
Taking anoro and doing well, using albuterol as well. No flare in the last several months. She is advised to call us early in flare as she has had multiple flares with hypoxia and hospitalization.

## 2017-09-14 ENCOUNTER — Telehealth: Payer: Self-pay | Admitting: Internal Medicine

## 2017-09-14 NOTE — Telephone Encounter (Signed)
We do not have her taking home oxygen as of our last visit. She did not require oxygen at that time.

## 2017-09-14 NOTE — Telephone Encounter (Signed)
Pt says at apt she requested that provider speak with [employer] to continue her oxygen. She said that they called her today to come for pick up. Pt would like for provider to call [employer] at: [phone]

## 2017-09-15 NOTE — Telephone Encounter (Signed)
States that she talked to you at her last visit briefly about being on oxygen since she left the hospital. She is only on it at night 2 Liters. They picked it up yesterday and she did not sleep well without it last night, she kept waking up in the middle of the night and she didn't do that with the O2. States that she had the sleep apnea test done and what happens is she gets into a deep sleep and her O2 goes down which causes her to gasp for air waking her up

## 2017-09-17 NOTE — Telephone Encounter (Signed)
Tried to call patient multiple times to inform her of MD response but no answer and no available Voicemail option

## 2017-09-17 NOTE — Telephone Encounter (Signed)
We would have to do an overnight oxygen test to determine if she qualifies for the oxygen. Would she like to do that?

## 2017-10-02 ENCOUNTER — Other Ambulatory Visit (INDEPENDENT_AMBULATORY_CARE_PROVIDER_SITE_OTHER): Payer: Medicare Other

## 2017-10-02 ENCOUNTER — Ambulatory Visit (INDEPENDENT_AMBULATORY_CARE_PROVIDER_SITE_OTHER): Payer: Medicare Other | Admitting: Internal Medicine

## 2017-10-02 ENCOUNTER — Encounter: Payer: Self-pay | Admitting: Internal Medicine

## 2017-10-02 VITALS — BP 144/84 | HR 56 | Temp 98.0°F | Ht 62.0 in | Wt 194.0 lb

## 2017-10-02 DIAGNOSIS — J449 Chronic obstructive pulmonary disease, unspecified: Secondary | ICD-10-CM

## 2017-10-02 DIAGNOSIS — E099 Drug or chemical induced diabetes mellitus without complications: Secondary | ICD-10-CM | POA: Diagnosis not present

## 2017-10-02 DIAGNOSIS — J4541 Moderate persistent asthma with (acute) exacerbation: Secondary | ICD-10-CM

## 2017-10-02 DIAGNOSIS — T380X5A Adverse effect of glucocorticoids and synthetic analogues, initial encounter: Secondary | ICD-10-CM

## 2017-10-02 DIAGNOSIS — J4489 Other specified chronic obstructive pulmonary disease: Secondary | ICD-10-CM

## 2017-10-02 LAB — HEMOGLOBIN A1C: Hgb A1c MFr Bld: 6.7 % — ABNORMAL HIGH (ref 4.6–6.5)

## 2017-10-02 MED ORDER — LEVOFLOXACIN 750 MG PO TABS: 750.0000 mg | ORAL_TABLET | Freq: Every day | ORAL | 0 refills | Status: DC

## 2017-10-02 MED ORDER — PREDNISONE 20 MG PO TABS: 40.0000 mg | ORAL_TABLET | Freq: Every day | ORAL | 0 refills | Status: DC

## 2017-10-02 NOTE — Progress Notes (Signed)
   Subjective:    Patient ID: Yvonne Todd, female    DOB: 1951/03/26, 67 y.o.   MRN: [id number]  HPI The patient is a 67 YO female coming in for flare of her breathing. Started about 1 week ago. She is taking her nebulizer about 2 times per day and still having SOB. She is not able to do any activities due to getting winded and tired. She is having cough. Denies much nasal congestion. No fevers or chills. She previously has been in the hospital with pneumonia and hypoxia. She is also feeling like she is having low oxygen at night time and wants to try to get back her night time oxygen. Overall worsening since onset.   Review of Systems  Constitutional: Positive for activity change and appetite change. Negative for chills, fatigue, fever and unexpected weight change.  HENT: Positive for congestion, postnasal drip, rhinorrhea and sinus pressure. Negative for ear discharge, ear pain, sinus pain, sneezing, sore throat, tinnitus, trouble swallowing and voice change.   Eyes: Negative.   Respiratory: Positive for cough and shortness of breath. Negative for chest tightness and wheezing.   Cardiovascular: Negative.   Gastrointestinal: Negative.   Musculoskeletal: Positive for myalgias.  Neurological: Negative.       Objective:   Physical Exam  Constitutional: She is oriented to person, place, and time. She appears well-developed and well-nourished.  HENT:  Head: Normocephalic and atraumatic.  Oropharynx with redness and clear drainage, nose with swollen turbinates, TMs normal bilaterally  Eyes: EOM are normal.  Neck: Normal range of motion. No thyromegaly present.  Cardiovascular: Normal rate and regular rhythm.  Pulmonary/Chest: Effort normal. No respiratory distress. She has wheezes. She has no rales.  Some rhonchi which partially clears with cough  Abdominal: Soft.  Musculoskeletal: She exhibits tenderness.  Lymphadenopathy:    She has no cervical adenopathy.  Neurological: She is alert  and oriented to person, place, and time.  Skin: Skin is warm and dry.   Vitals:   10/02/17 1334  BP: (!) 144/84  Pulse: (!) 56  Temp: 98 F (36.7 C)  TempSrc: Oral  SpO2: 98%  Weight: 194 lb (88 kg)  Height: 5\' 2"  (1.575 m)      Assessment & Plan:

## 2017-10-02 NOTE — Patient Instructions (Addendum)
We will send in levaquin to take 1 pill daily for 5 days.   We have also sent in the prednisone take 2 pills daily for 5 days.   We are checking the sugars today and will call you back about the results.

## 2017-10-02 NOTE — Assessment & Plan Note (Signed)
In flare today and needs prednisone and levaquin. She will also need overnight oximetry to meet insurance guidelines for night time oxygen. Continue inhalers and albuterol and nebulizer. If worsening SOB or lack of resolution needs to seek care in the ER.

## 2017-10-08 ENCOUNTER — Ambulatory Visit (INDEPENDENT_AMBULATORY_CARE_PROVIDER_SITE_OTHER)
Admission: RE | Admit: 2017-10-08 | Discharge: 2017-10-08 | Disposition: A | Discharge status: Home / Self Care | Payer: Medicare Other | Source: Ambulatory Visit | Attending: Internal Medicine | Admitting: Internal Medicine

## 2017-10-08 ENCOUNTER — Ambulatory Visit (INDEPENDENT_AMBULATORY_CARE_PROVIDER_SITE_OTHER): Payer: Medicare Other | Admitting: Internal Medicine

## 2017-10-08 ENCOUNTER — Encounter: Payer: Self-pay | Admitting: Internal Medicine

## 2017-10-08 VITALS — BP 130/60 | HR 76 | Temp 98.4°F | Ht 62.0 in | Wt 199.0 lb

## 2017-10-08 DIAGNOSIS — J449 Chronic obstructive pulmonary disease, unspecified: Secondary | ICD-10-CM

## 2017-10-08 DIAGNOSIS — R05 Cough: Secondary | ICD-10-CM | POA: Diagnosis not present

## 2017-10-08 DIAGNOSIS — J4489 Other specified chronic obstructive pulmonary disease: Secondary | ICD-10-CM

## 2017-10-08 MED ORDER — METHYLPREDNISOLONE ACETATE 80 MG/ML IJ SUSP
80.0000 mg | Freq: Once | INTRAMUSCULAR | Status: AC
  Administered 2017-10-08: 80 mg via INTRAMUSCULAR

## 2017-10-08 MED ORDER — IPRATROPIUM-ALBUTEROL 0.5-2.5 (3) MG/3ML IN SOLN
3.0000 mL | Freq: Once | RESPIRATORY_TRACT | Status: AC
  Administered 2017-10-08: 3 mL via RESPIRATORY_TRACT

## 2017-10-08 NOTE — Patient Instructions (Signed)
We are checking the chest x-ray today and have given you a steroid injection. We will call back about the x-ray.

## 2017-10-08 NOTE — Progress Notes (Signed)
   Subjective:    Patient ID: Yvonne Todd, female    DOB: 1951-04-04, 67 y.o.   MRN: [id number]  HPI The patient is a 67 YO female coming in for SOB and not feeling well still. She just finished 5 days of levaquin and prednisone. Still having some SOB. Very exhausted. Having some high sugars due to the prednisone. Stopped using nebulizer machine about 5 days ago because she thought this was increasing her sugars. Cough is mildly better but SOB is about the same. No drainage still or sinus pressure.   Call number [phone]  Review of Systems  Constitutional: Positive for activity change, appetite change and fatigue. Negative for chills, fever and unexpected weight change.  HENT: Positive for sinus pressure. Negative for congestion, ear discharge, ear pain, postnasal drip, rhinorrhea, sinus pain, sneezing, sore throat, tinnitus, trouble swallowing and voice change.   Eyes: Negative.   Respiratory: Positive for cough and shortness of breath. Negative for chest tightness and wheezing.   Cardiovascular: Negative.   Gastrointestinal: Negative.   Musculoskeletal: Positive for myalgias.  Neurological: Negative.       Objective:   Physical Exam  Constitutional: She is oriented to person, place, and time. She appears well-developed and well-nourished.  HENT:  Head: Normocephalic and atraumatic.  Eyes: EOM are normal.  Neck: Normal range of motion.  Cardiovascular: Normal rate and regular rhythm.  Pulmonary/Chest: She is in respiratory distress. She has no wheezes. She has no rales.  Very tight, after duoneb much better airflow and slight rhonchi. Dyspneic but able to talk in full sentences.  Abdominal: Soft. Bowel sounds are normal. She exhibits no distension. There is no tenderness. There is no rebound.  Musculoskeletal: She exhibits no edema.  Neurological: She is alert and oriented to person, place, and time. Coordination normal.  Skin: Skin is warm and dry.  Psychiatric: She has a  normal mood and affect.   Vitals:   10/08/17 1403  BP: 130/60  Pulse: 76  Temp: 98.4 F (36.9 C)  TempSrc: Oral  SpO2: 95%  Weight: 199 lb (90.3 kg)  Height: 5\' 2"  (1.575 m)      Assessment & Plan:  Duo-neb given at visit Depo-medrol 80 mg IM given at visit

## 2017-10-09 ENCOUNTER — Telehealth: Payer: Self-pay | Admitting: Internal Medicine

## 2017-10-09 NOTE — Telephone Encounter (Signed)
Patient informed of results.  

## 2017-10-09 NOTE — Telephone Encounter (Signed)
Copied from CRM [id number]. Topic: Quick Communication - See Telephone Encounter >> Oct 09, 2017  9:26 AM Eston Mouldavis, Dior Dominik B wrote: CRM for notification. See Telephone encounter for:  Pt called for results of chest xray 10/09/17   pt wants call at [phone]  Until 1230

## 2017-10-09 NOTE — Assessment & Plan Note (Signed)
Checking CXR and add another antibiotic if needed. Depo-medrol 80 mg IM given at visit and duo neb given at visit. Asked her to schedule nebulizers TID at home to see if this can open her up more.

## 2017-10-15 ENCOUNTER — Inpatient Hospital Stay (HOSPITAL_COMMUNITY)
Admission: EM | Admit: 2017-10-15 | Discharge: 2017-10-17 | DRG: 190 | Disposition: A | Discharge status: Home / Self Care | Payer: Medicare Other | Attending: Family Medicine | Admitting: Family Medicine

## 2017-10-15 ENCOUNTER — Emergency Department (HOSPITAL_COMMUNITY): Payer: Medicare Other

## 2017-10-15 ENCOUNTER — Encounter (HOSPITAL_COMMUNITY): Payer: Self-pay | Admitting: *Deleted

## 2017-10-15 DIAGNOSIS — Z823 Family history of stroke: Secondary | ICD-10-CM | POA: Diagnosis not present

## 2017-10-15 DIAGNOSIS — T380X5A Adverse effect of glucocorticoids and synthetic analogues, initial encounter: Secondary | ICD-10-CM

## 2017-10-15 DIAGNOSIS — Z8249 Family history of ischemic heart disease and other diseases of the circulatory system: Secondary | ICD-10-CM

## 2017-10-15 DIAGNOSIS — Z881 Allergy status to other antibiotic agents status: Secondary | ICD-10-CM | POA: Diagnosis not present

## 2017-10-15 DIAGNOSIS — J441 Chronic obstructive pulmonary disease with (acute) exacerbation: Principal | ICD-10-CM | POA: Diagnosis present

## 2017-10-15 DIAGNOSIS — Z791 Long term (current) use of non-steroidal anti-inflammatories (NSAID): Secondary | ICD-10-CM

## 2017-10-15 DIAGNOSIS — Z888 Allergy status to other drugs, medicaments and biological substances status: Secondary | ICD-10-CM | POA: Diagnosis not present

## 2017-10-15 DIAGNOSIS — Z803 Family history of malignant neoplasm of breast: Secondary | ICD-10-CM

## 2017-10-15 DIAGNOSIS — I5032 Chronic diastolic (congestive) heart failure: Secondary | ICD-10-CM | POA: Diagnosis present

## 2017-10-15 DIAGNOSIS — J9611 Chronic respiratory failure with hypoxia: Secondary | ICD-10-CM | POA: Diagnosis present

## 2017-10-15 DIAGNOSIS — I110 Hypertensive heart disease with heart failure: Secondary | ICD-10-CM | POA: Diagnosis present

## 2017-10-15 DIAGNOSIS — E099 Drug or chemical induced diabetes mellitus without complications: Secondary | ICD-10-CM | POA: Diagnosis present

## 2017-10-15 DIAGNOSIS — J45909 Unspecified asthma, uncomplicated: Secondary | ICD-10-CM | POA: Diagnosis not present

## 2017-10-15 DIAGNOSIS — Z794 Long term (current) use of insulin: Secondary | ICD-10-CM | POA: Diagnosis not present

## 2017-10-15 DIAGNOSIS — Z880 Allergy status to penicillin: Secondary | ICD-10-CM | POA: Diagnosis not present

## 2017-10-15 DIAGNOSIS — J9601 Acute respiratory failure with hypoxia: Secondary | ICD-10-CM | POA: Diagnosis not present

## 2017-10-15 DIAGNOSIS — E0921 Drug or chemical induced diabetes mellitus with diabetic nephropathy: Secondary | ICD-10-CM | POA: Diagnosis present

## 2017-10-15 DIAGNOSIS — Z833 Family history of diabetes mellitus: Secondary | ICD-10-CM | POA: Diagnosis not present

## 2017-10-15 DIAGNOSIS — Z7982 Long term (current) use of aspirin: Secondary | ICD-10-CM

## 2017-10-15 DIAGNOSIS — Z825 Family history of asthma and other chronic lower respiratory diseases: Secondary | ICD-10-CM

## 2017-10-15 DIAGNOSIS — Z79899 Other long term (current) drug therapy: Secondary | ICD-10-CM | POA: Diagnosis not present

## 2017-10-15 DIAGNOSIS — Z96651 Presence of right artificial knee joint: Secondary | ICD-10-CM | POA: Diagnosis present

## 2017-10-15 DIAGNOSIS — Z841 Family history of disorders of kidney and ureter: Secondary | ICD-10-CM | POA: Diagnosis not present

## 2017-10-15 DIAGNOSIS — Z801 Family history of malignant neoplasm of trachea, bronchus and lung: Secondary | ICD-10-CM | POA: Diagnosis not present

## 2017-10-15 DIAGNOSIS — E785 Hyperlipidemia, unspecified: Secondary | ICD-10-CM | POA: Diagnosis present

## 2017-10-15 DIAGNOSIS — Z7951 Long term (current) use of inhaled steroids: Secondary | ICD-10-CM

## 2017-10-15 DIAGNOSIS — F17210 Nicotine dependence, cigarettes, uncomplicated: Secondary | ICD-10-CM | POA: Diagnosis present

## 2017-10-15 DIAGNOSIS — I10 Essential (primary) hypertension: Secondary | ICD-10-CM | POA: Diagnosis present

## 2017-10-15 DIAGNOSIS — Z91013 Allergy to seafood: Secondary | ICD-10-CM

## 2017-10-15 DIAGNOSIS — Z821 Family history of blindness and visual loss: Secondary | ICD-10-CM

## 2017-10-15 DIAGNOSIS — E118 Type 2 diabetes mellitus with unspecified complications: Secondary | ICD-10-CM | POA: Diagnosis present

## 2017-10-15 DIAGNOSIS — E0965 Drug or chemical induced diabetes mellitus with hyperglycemia: Secondary | ICD-10-CM | POA: Diagnosis present

## 2017-10-15 DIAGNOSIS — R739 Hyperglycemia, unspecified: Secondary | ICD-10-CM

## 2017-10-15 DIAGNOSIS — R069 Unspecified abnormalities of breathing: Secondary | ICD-10-CM | POA: Diagnosis not present

## 2017-10-15 LAB — CBC WITH DIFFERENTIAL/PLATELET
Basophils Absolute: 0 10*3/uL (ref 0.0–0.1)
Basophils Relative: 0 %
Eosinophils Absolute: 0 10*3/uL (ref 0.0–0.7)
Eosinophils Relative: 0 %
HCT: 43.8 % (ref 36.0–46.0)
Hemoglobin: 14.1 g/dL (ref 12.0–15.0)
Lymphocytes Relative: 10 %
Lymphs Abs: 1.3 10*3/uL (ref 0.7–4.0)
MCH: 30.8 pg (ref 26.0–34.0)
MCHC: 32.2 g/dL (ref 30.0–36.0)
MCV: 95.6 fL (ref 78.0–100.0)
Monocytes Absolute: 0.6 10*3/uL (ref 0.1–1.0)
Monocytes Relative: 4 %
Neutro Abs: 11.3 10*3/uL — ABNORMAL HIGH (ref 1.7–7.7)
Neutrophils Relative %: 86 %
Platelets: 277 10*3/uL (ref 150–400)
RBC: 4.58 MIL/uL (ref 3.87–5.11)
RDW: 12.6 % (ref 11.5–15.5)
WBC: 13.2 10*3/uL — ABNORMAL HIGH (ref 4.0–10.5)

## 2017-10-15 LAB — BASIC METABOLIC PANEL
Anion gap: 14 (ref 5–15)
BUN: 16 mg/dL (ref 6–20)
CO2: 26 mmol/L (ref 22–32)
Calcium: 9 mg/dL (ref 8.9–10.3)
Chloride: 100 mmol/L — ABNORMAL LOW (ref 101–111)
Creatinine, Ser: 0.83 mg/dL (ref 0.44–1.00)
GFR calc Af Amer: >60 mL/min (ref >60)
GFR calc non Af Amer: >60 mL/min (ref >60)
Glucose, Bld: 244 mg/dL — ABNORMAL HIGH (ref 65–99)
Potassium: 3.8 mmol/L (ref 3.5–5.1)
Sodium: 140 mmol/L (ref 135–145)

## 2017-10-15 LAB — CBG MONITORING, ED: Glucose-Capillary: 208 mg/dL — ABNORMAL HIGH (ref 65–99)

## 2017-10-15 MED ORDER — ONDANSETRON HCL 4 MG/2ML IJ SOLN: 4.0000 mg | Freq: Four times a day (QID) | INTRAMUSCULAR | Status: DC | PRN

## 2017-10-15 MED ORDER — METHYLPREDNISOLONE SODIUM SUCC 125 MG IJ SOLR
125.0000 mg | Freq: Once | INTRAMUSCULAR | Status: AC
  Administered 2017-10-15: 125 mg via INTRAVENOUS
  Filled 2017-10-15: qty 2

## 2017-10-15 MED ORDER — ALBUTEROL (5 MG/ML) CONTINUOUS INHALATION SOLN
15.0000 mg/h | INHALATION_SOLUTION | RESPIRATORY_TRACT | Status: DC
  Administered 2017-10-15: 15 mg/h via RESPIRATORY_TRACT
  Filled 2017-10-15: qty 20

## 2017-10-15 MED ORDER — INSULIN ASPART 100 UNIT/ML ~~LOC~~ SOLN
0.0000 [IU] | Freq: Three times a day (TID) | SUBCUTANEOUS | Status: DC
  Administered 2017-10-16: 3 [IU] via SUBCUTANEOUS
  Administered 2017-10-16 (×2): 5 [IU] via SUBCUTANEOUS
  Administered 2017-10-17: 3 [IU] via SUBCUTANEOUS
  Administered 2017-10-17: 2 [IU] via SUBCUTANEOUS
  Administered 2017-10-17: 3 [IU] via SUBCUTANEOUS

## 2017-10-15 MED ORDER — IPRATROPIUM BROMIDE 0.02 % IN SOLN: 0.5000 mg | Freq: Four times a day (QID) | RESPIRATORY_TRACT | Status: DC

## 2017-10-15 MED ORDER — SODIUM CHLORIDE 0.9% FLUSH
3.0000 mL | Freq: Two times a day (BID) | INTRAVENOUS | Status: DC
Start: 2017-10-15 — End: 2017-10-17
  Administered 2017-10-15 – 2017-10-17 (×3): 3 mL via INTRAVENOUS

## 2017-10-15 MED ORDER — ASPIRIN EC 81 MG PO TBEC
81.0000 mg | DELAYED_RELEASE_TABLET | Freq: Every day | ORAL | Status: DC
  Administered 2017-10-16 – 2017-10-17 (×2): 81 mg via ORAL
  Filled 2017-10-15 (×2): qty 1

## 2017-10-15 MED ORDER — ALBUTEROL SULFATE (2.5 MG/3ML) 0.083% IN NEBU: 2.5000 mg | INHALATION_SOLUTION | Freq: Four times a day (QID) | RESPIRATORY_TRACT | Status: DC

## 2017-10-15 MED ORDER — AMLODIPINE BESYLATE 10 MG PO TABS
10.0000 mg | ORAL_TABLET | Freq: Every day | ORAL | Status: DC
  Administered 2017-10-16 – 2017-10-17 (×2): 10 mg via ORAL
  Filled 2017-10-15 (×2): qty 1

## 2017-10-15 MED ORDER — ONDANSETRON HCL 4 MG PO TABS: 4.0000 mg | ORAL_TABLET | Freq: Four times a day (QID) | ORAL | Status: DC | PRN

## 2017-10-15 MED ORDER — GUAIFENESIN ER 600 MG PO TB12: 600.0000 mg | ORAL_TABLET | Freq: Two times a day (BID) | ORAL | Status: DC | PRN

## 2017-10-15 MED ORDER — ACETAMINOPHEN 650 MG RE SUPP: 650.0000 mg | Freq: Four times a day (QID) | RECTAL | Status: DC | PRN

## 2017-10-15 MED ORDER — IPRATROPIUM-ALBUTEROL 0.5-2.5 (3) MG/3ML IN SOLN: 3.0000 mL | Freq: Four times a day (QID) | RESPIRATORY_TRACT | Status: DC

## 2017-10-15 MED ORDER — INSULIN GLARGINE 100 UNIT/ML ~~LOC~~ SOLN
10.0000 [IU] | Freq: Every day | SUBCUTANEOUS | Status: DC
  Administered 2017-10-15: 10 [IU] via SUBCUTANEOUS
  Filled 2017-10-15: qty 0.1

## 2017-10-15 MED ORDER — SODIUM CHLORIDE 0.9 % IV SOLN: 250.0000 mL | INTRAVENOUS | Status: DC | PRN

## 2017-10-15 MED ORDER — ALBUTEROL SULFATE (2.5 MG/3ML) 0.083% IN NEBU: 2.5000 mg | INHALATION_SOLUTION | RESPIRATORY_TRACT | Status: DC | PRN

## 2017-10-15 MED ORDER — ATORVASTATIN CALCIUM 40 MG PO TABS
40.0000 mg | ORAL_TABLET | Freq: Every day | ORAL | Status: DC
  Administered 2017-10-15 – 2017-10-16 (×2): 40 mg via ORAL
  Filled 2017-10-15 (×2): qty 1

## 2017-10-15 MED ORDER — METHYLPREDNISOLONE SODIUM SUCC 125 MG IJ SOLR
80.0000 mg | Freq: Two times a day (BID) | INTRAMUSCULAR | Status: DC
  Administered 2017-10-16: 80 mg via INTRAVENOUS
  Filled 2017-10-15: qty 2

## 2017-10-15 MED ORDER — ACETAMINOPHEN 325 MG PO TABS
650.0000 mg | ORAL_TABLET | Freq: Four times a day (QID) | ORAL | Status: DC | PRN
  Administered 2017-10-16: 650 mg via ORAL
  Filled 2017-10-15: qty 2

## 2017-10-15 MED ORDER — SODIUM CHLORIDE 0.9% FLUSH: 3.0000 mL | INTRAVENOUS | Status: DC | PRN
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
## 2017-10-15 NOTE — ED Notes (Signed)
CBG 208 @ 11:55pm jbf
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
## 2017-10-15 NOTE — ED Notes (Signed)
Bed: WA11 Expected date:  Expected time:  Means of arrival:  Comments: EMS-SOB 

## 2017-10-16 ENCOUNTER — Other Ambulatory Visit: Payer: Self-pay

## 2017-10-16 LAB — CBC
HEMATOCRIT: 39.3 % (ref 36.0–46.0)
HEMOGLOBIN: 12.6 g/dL (ref 12.0–15.0)
MCH: 30.6 pg (ref 26.0–34.0)
MCHC: 32.1 g/dL (ref 30.0–36.0)
MCV: 95.4 fL (ref 78.0–100.0)
Platelets: 300 10*3/uL (ref 150–400)
RBC: 4.12 MIL/uL (ref 3.87–5.11)
RDW: 12.7 % (ref 11.5–15.5)
WBC: 12 10*3/uL — AB (ref 4.0–10.5)

## 2017-10-16 LAB — BASIC METABOLIC PANEL
ANION GAP: 12 (ref 5–15)
BUN: 13 mg/dL (ref 6–20)
CALCIUM: 8.7 mg/dL — AB (ref 8.9–10.3)
CO2: 26 mmol/L (ref 22–32)
Chloride: 103 mmol/L (ref 101–111)
Creatinine, Ser: 0.63 mg/dL (ref 0.44–1.00)
GLUCOSE: 227 mg/dL — AB (ref 65–99)
Potassium: 4.2 mmol/L (ref 3.5–5.1)
SODIUM: 141 mmol/L (ref 135–145)

## 2017-10-16 LAB — GLUCOSE, CAPILLARY
GLUCOSE-CAPILLARY: 281 mg/dL — AB (ref 65–99)
GLUCOSE-CAPILLARY: 272 mg/dL — AB (ref 65–99)
Glucose-Capillary: 244 mg/dL — ABNORMAL HIGH (ref 65–99)
Glucose-Capillary: 247 mg/dL — ABNORMAL HIGH (ref 65–99)

## 2017-10-16 LAB — PROCALCITONIN

## 2017-10-16 MED ORDER — IPRATROPIUM-ALBUTEROL 0.5-2.5 (3) MG/3ML IN SOLN: 3.0000 mL | Freq: Two times a day (BID) | RESPIRATORY_TRACT | Status: DC

## 2017-10-16 MED ORDER — METHYLPREDNISOLONE SODIUM SUCC 40 MG IJ SOLR
40.0000 mg | Freq: Two times a day (BID) | INTRAMUSCULAR | Status: DC
  Administered 2017-10-16 – 2017-10-17 (×2): 40 mg via INTRAVENOUS
  Filled 2017-10-16 (×2): qty 1

## 2017-10-16 MED ORDER — SODIUM CHLORIDE 0.9 % IV SOLN
500.0000 mg | INTRAVENOUS | Status: DC
  Administered 2017-10-16 – 2017-10-17 (×2): 500 mg via INTRAVENOUS
  Filled 2017-10-16 (×2): qty 500

## 2017-10-16 MED ORDER — INSULIN GLARGINE 100 UNIT/ML ~~LOC~~ SOLN
15.0000 [IU] | Freq: Every day | SUBCUTANEOUS | Status: DC
  Administered 2017-10-16: 15 [IU] via SUBCUTANEOUS
  Filled 2017-10-16 (×2): qty 0.15

## 2017-10-16 MED ORDER — IPRATROPIUM-ALBUTEROL 0.5-2.5 (3) MG/3ML IN SOLN
3.0000 mL | Freq: Three times a day (TID) | RESPIRATORY_TRACT | Status: DC
  Administered 2017-10-16: 3 mL via RESPIRATORY_TRACT
  Filled 2017-10-16: qty 3

## 2017-10-16 MED ORDER — GLIPIZIDE 5 MG PO TABS
5.0000 mg | ORAL_TABLET | Freq: Two times a day (BID) | ORAL | Status: DC
  Administered 2017-10-16 – 2017-10-17 (×3): 5 mg via ORAL
  Filled 2017-10-16 (×3): qty 1

## 2017-10-16 MED ORDER — IPRATROPIUM-ALBUTEROL 0.5-2.5 (3) MG/3ML IN SOLN
3.0000 mL | RESPIRATORY_TRACT | Status: DC
  Administered 2017-10-16 – 2017-10-17 (×5): 3 mL via RESPIRATORY_TRACT
  Filled 2017-10-16 (×5): qty 3

## 2017-10-16 MED ORDER — BUDESONIDE 0.5 MG/2ML IN SUSP
0.5000 mg | Freq: Two times a day (BID) | RESPIRATORY_TRACT | Status: DC
  Administered 2017-10-16 – 2017-10-17 (×3): 0.5 mg via RESPIRATORY_TRACT
  Filled 2017-10-16 (×3): qty 2

## 2017-10-16 NOTE — Progress Notes (Signed)
PROGRESS NOTE    Yvonne Todd  ZOX:[id number] DOB: [birth year] DOA: 10/15/2017 PCP: Myrlene Broker, MD   Brief Narrative:   67 year old female with history of COPD, hypertension, steroid-induced diabetes came to the hospital with complaints of shortness of breath and wheezing.  She states she has been struggling with this for past several weeks and was placed on Levaquin and out patient steroid taper.  She did well at first but as she came off of that she started having the symptoms again which worsened yesterday evening all of a sudden therefore came to the ER.  She was admitted for COPD exacerbation.  Assessment & Plan:   Principal Problem:   COPD exacerbation (HCC) Active Problems:   Steroid-induced diabetes (HCC)   Essential hypertension   Acute respiratory failure with hypoxia (HCC)   Chronic diastolic CHF (congestive heart failure) (HCC)   Acute mild to moderate COPD exacerbation of mild persistent disease -Patient still continues to have diminished breath sounds with mild expiratory wheezing.  Will change Solu-Medrol 80 mg to 40 mg - Accu-Chek and sliding scale while on steroids -Change duo nebs from every 12 hours to every 4 hours standing along with as needed ordered -IV azithromycin added, Pulmicort added -Check pro-calcitonin -Antitussives as needed -Will need ambulatory pulse ox when she is feeling little better in the meantime continue supplemental oxygen  Hyperglycemia Diabetes mellitus-steroid-induced - Hyperglycemic as she is on steroids.  Increased her Lantus from 10 units to 15 units -Continue sliding scale -Restart her glipizide  Essential hypertension -Continue Norvasc 10 mg daily  Chronic diastolic CHF -Appears to be stable at this time.  Provide supportive care  Hyperlipidemia -Continue statin-atorvastatin 40 mg   DVT prophylaxis: SCDs Code Status: Full code Family Communication: None at bedside Disposition Plan: To be  determined     Antimicrobials:   Azithromycin 2/14   Subjective: States her breathing is improved a little bit.  Have not tried ambulating yet.  She still has significant amount of cough.  Objective: Vitals:   10/15/17 2345 10/16/17 0000 10/16/17 0033 10/16/17 0744  BP:  (!) 146/66 (!) 157/79   Pulse: 74 72 83   Resp: 14 12 16    Temp:   98.3 F (36.8 C)   TempSrc:   Oral   SpO2: 98% 97% 100% 95%    Intake/Output Summary (Last 24 hours) at 10/16/2017 1224 Last data filed at 10/16/2017 0957 Gross per 24 hour  Intake 243 ml  Output -  Net 243 ml   There were no vitals filed for this visit.  Examination:  General exam: Appears calm and comfortable  Respiratory system: Diffuse diminished breath sounds with mild expiratory wheezing.  Significant amount of cough especially with deep breaths  cardiovascular system: S1 & S2 heard, RRR. No JVD, murmurs, rubs, gallops or clicks. No pedal edema. Gastrointestinal system: Abdomen is nondistended, soft and nontender. No organomegaly or masses felt. Normal bowel sounds heard. Central nervous system: Alert and oriented. No focal neurological deficits. Extremities: Symmetric 5 x 5 power. Skin: No rashes, lesions or ulcers Psychiatry: Judgement and insight appear normal. Mood & affect appropriate.     Data Reviewed:   CBC: Recent Labs  Lab 10/15/17 2022 10/16/17 0319  WBC 13.2* 12.0*  NEUTROABS 11.3*  --   HGB 14.1 12.6  HCT 43.8 39.3  MCV 95.6 95.4  PLT 277 300   Basic Metabolic Panel: Recent Labs  Lab 10/15/17 2022 10/16/17 0319  NA 140 141  K 3.8 4.2  CL 100* 103  CO2 26 26  GLUCOSE 244* 227*  BUN 16 13  CREATININE 0.83 0.63  CALCIUM 9.0 8.7*   GFR: Estimated Creatinine Clearance: 71.3 mL/min (by C-G formula based on SCr of 0.63 mg/dL). Liver Function Tests: No results for input(s): AST, ALT, ALKPHOS, BILITOT, PROT, ALBUMIN in the last 168 hours. No results for input(s): LIPASE, AMYLASE in the last 168  hours. No results for input(s): AMMONIA in the last 168 hours. Coagulation Profile: No results for input(s): INR, PROTIME in the last 168 hours. Cardiac Enzymes: No results for input(s): CKTOTAL, CKMB, CKMBINDEX, TROPONINI in the last 168 hours. BNP (last 3 results) No results for input(s): PROBNP in the last 8760 hours. HbA1C: No results for input(s): HGBA1C in the last 72 hours. CBG: Recent Labs  Lab 10/15/17 2354 10/16/17 0810 10/16/17 1154  GLUCAP 208* 281* 272*   Lipid Profile: No results for input(s): CHOL, HDL, LDLCALC, TRIG, CHOLHDL, LDLDIRECT in the last 72 hours. Thyroid Function Tests: No results for input(s): TSH, T4TOTAL, FREET4, T3FREE, THYROIDAB in the last 72 hours. Anemia Panel: No results for input(s): VITAMINB12, FOLATE, FERRITIN, TIBC, IRON, RETICCTPCT in the last 72 hours. Sepsis Labs: Recent Labs  Lab 10/16/17 0319  PROCALCITON <0.10    No results found for this or any previous visit (from the past 240 hour(s)).       Radiology Studies: Dg Chest 2 View  Result Date: 10/15/2017 CLINICAL DATA:  Respiratory distress for 2 hours.  Asthma. EXAM: CHEST  2 VIEW COMPARISON:  Chest radiograph 10/08/2017 FINDINGS: The heart size and mediastinal contours are within normal limits. Both lungs are clear. The visualized skeletal structures are unremarkable. IMPRESSION: No active cardiopulmonary disease. Electronically Signed   By: Deatra RobinsonKevin  Herman M.D.   On: 10/15/2017 19:45        Scheduled Meds: . amLODipine  10 mg Oral Daily  . aspirin EC  81 mg Oral Daily  . atorvastatin  40 mg Oral QHS  . budesonide  0.5 mg Nebulization BID  . insulin aspart  0-9 Units Subcutaneous TID WC  . insulin glargine  15 Units Subcutaneous QHS  . ipratropium-albuterol  3 mL Nebulization Q4H  . methylPREDNISolone (SOLU-MEDROL) injection  40 mg Intravenous Q12H  . sodium chloride flush  3 mL Intravenous Q12H   Continuous Infusions: . sodium chloride    . azithromycin 500 mg  (10/16/17 1216)     LOS: 1 day    Time spent: 35 mins    Jahi Roza Joline Maxcyhirag Harleen Fineberg, MD [facility] Pager [phone]   If 7PM-7AM, please contact night-coverage [email or web address] Password [facility] 10/16/2017, 12:24 PM

## 2017-10-16 NOTE — ED Notes (Signed)
ED TO INPATIENT HANDOFF REPORT  Name/Age/Gender Yvonne Todd 67 y.o. female  Code Status    Code Status Orders  (From admission, onward)        Start     Ordered   10/15/17 11/11/22  Full code  Continuous     10/15/17 2324    Code Status History    Date Active Date Inactive Code Status Order ID Comments User Context   04/13/2017 21:42 04/15/2017 16:30 Full Code [id number]  Toy Baker, MD Inpatient   05/23/2016 18:04 05/31/2016 17:48 Full Code [id number]  Mariel Aloe, MD Inpatient   06/18/2015 22:26 06/23/2015 19:01 Full Code [id number]  Geradine Girt, DO Inpatient   07/03/2014 14:25 07/07/2014 00:09 Full Code [id number]  Blondell Reveal Inpatient   03/19/2014 18:28 03/20/2014 19:37 Full Code [id number]  Cristal Ford, DO Inpatient      Home/SNF/Other Home  Chief Complaint Shortness of Breath  Level of Care/Admitting Diagnosis ED Disposition    ED Disposition Condition [facility]: [facility] [[id number]]  Level of Care: Med-Surg [16]  Diagnosis: COPD exacerbation [facility]) [[id number]]  Admitting Physician: Phillips Grout [[id number]]  Attending Physician: Derrill Kay A [[id number]]  Estimated length of stay: past midnight tomorrow  Certification:: I certify this patient will need inpatient services for at least 2 midnights  PT Class (Do Not Modify): Inpatient [101]  PT Acc Code (Do Not Modify): Private [1]       Medical History Past Medical History:  Diagnosis Date  . Allergy    Iodine, Penicillin, Tetracycline.  Patient reports reaction of hives, rash, no sob, no wheezing   . Arthritis    .Right knee, right hip  . Asthma   . Depression    Past year depression not sleeping after fiancee deceased 11/11/12  . Diabetes ([place])   . Hyperlipidemia    Dx 03/2014  . Hypertension    Dx 03/2014  . Pneumonia    hx of recently po antibiotics  . Primary localized osteoarthritis of right knee     Allergies Allergies  Allergen  Reactions  . Fish Allergy Rash and Other (See Comments)    Some types of fish with iodine causes rash  . Iodine Rash  . Penicillins Rash and Other (See Comments)    Has patient had a PCN reaction causing immediate rash, facial/tongue/throat swelling, SOB or lightheadedness with hypotension: yes Has patient had a PCN reaction causing severe rash involving mucus membranes or skin necrosis: no Has patient had a PCN reaction that required hospitalization: no Has patient had a PCN reaction occurring within the last 10 years: no If all of the above answers are "NO", then may proceed with Cephalosporin use.   . Tetracyclines & Related Rash    IV Location/Drains/Wounds Patient Lines/Drains/Airways Status   Active Line/Drains/Airways    Name:   Placement date:   Placement time:   Site:   Days:   Peripheral IV 10/15/17 Right;Upper Arm   10/15/17    2345    Arm   1          Labs/Imaging Results for orders placed or performed during the hospital encounter of 10/15/17 (from the past 48 hour(s))  Basic metabolic panel     Status: Abnormal   Collection Time: 10/15/17  8:22 PM  Result Value Ref Range   Sodium 140 135 - 145 mmol/L   Potassium 3.8 3.5 - 5.1 mmol/L   Chloride 100 (L) 101 -  111 mmol/L   CO2 26 22 - 32 mmol/L   Glucose, Bld 244 (H) 65 - 99 mg/dL   BUN 16 6 - 20 mg/dL   Creatinine, Ser 0.83 0.44 - 1.00 mg/dL   Calcium 9.0 8.9 - 10.3 mg/dL   GFR calc non Af Amer >60 >60 mL/min   GFR calc Af Amer >60 >60 mL/min    Comment: (NOTE) The eGFR has been calculated using the CKD EPI equation. This calculation has not been validated in all clinical situations. eGFR's persistently <60 mL/min signify possible Chronic Kidney Disease.    Anion gap 14 5 - 15    Comment: Performed at [facility], [place] [address], [place], [place] [address]  CBC with Differential     Status: Abnormal   Collection Time: 10/15/17  8:22 PM  Result Value Ref Range   WBC 13.2 (H) 4.0 - 10.5  K/uL   RBC 4.58 3.87 - 5.11 MIL/uL   Hemoglobin 14.1 12.0 - 15.0 g/dL   HCT 43.8 36.0 - 46.0 %   MCV 95.6 78.0 - 100.0 fL   MCH 30.8 26.0 - 34.0 pg   MCHC 32.2 30.0 - 36.0 g/dL   RDW 12.6 11.5 - 15.5 %   Platelets 277 150 - 400 K/uL   Neutrophils Relative % 86 %   Neutro Abs 11.3 (H) 1.7 - 7.7 K/uL   Lymphocytes Relative 10 %   Lymphs Abs 1.3 0.7 - 4.0 K/uL   Monocytes Relative 4 %   Monocytes Absolute 0.6 0.1 - 1.0 K/uL   Eosinophils Relative 0 %   Eosinophils Absolute 0.0 0.0 - 0.7 K/uL   Basophils Relative 0 %   Basophils Absolute 0.0 0.0 - 0.1 K/uL    Comment: Performed at [facility], [place] [address] [place], [place] [address]  CBG monitoring, ED     Status: Abnormal   Collection Time: 10/15/17 11:54 PM  Result Value Ref Range   Glucose-Capillary 208 (H) 65 - 99 mg/dL   Dg Chest 2 View  Result Date: 10/15/2017 CLINICAL DATA:  Respiratory distress for 2 hours.  Asthma. EXAM: CHEST  2 VIEW COMPARISON:  Chest radiograph 10/08/2017 FINDINGS: The heart size and mediastinal contours are within normal limits. Both lungs are clear. The visualized skeletal structures are unremarkable. IMPRESSION: No active cardiopulmonary disease. Electronically Signed   By: Ulyses Jarred M.D.   On: 10/15/2017 19:45    Pending Labs Unresulted Labs (From admission, onward)   Start     Ordered   10/16/17 6629  Basic metabolic panel  Tomorrow morning,   R     10/15/17 2324   10/16/17 0500  CBC  Tomorrow morning,   R     10/15/17 2324      Vitals/Pain Today's Vitals   10/15/17 2130 10/15/17 2145 10/15/17 2200 10/15/17 2215  BP: (!) 131/57 (!) 141/60 126/77 (!) 136/44  Pulse: 74 66 78 79  Resp: _0 Temp:      TempSrc:      SpO2: 96% 99% 98% 98%    Isolation Precautions No active isolations  Medications Medications  amLODipine (NORVASC) tablet 10 mg (not administered)  aspirin EC tablet 81 mg (not administered)  atorvastatin (LIPITOR) tablet 40 mg (40 mg  Oral Given 10/15/17 2350)  guaiFENesin (MUCINEX) 12 hr tablet 600 mg (not administered)  insulin glargine (LANTUS) injection 10 Units (10 Units Subcutaneous Given 10/15/17 2358)  sodium chloride flush (NS) 0.9 % injection  3 mL (3 mLs Intravenous Given 10/15/17 2350)  sodium chloride flush (NS) 0.9 % injection 3 mL (not administered)  0.9 %  sodium chloride infusion (not administered)  acetaminophen (TYLENOL) tablet 650 mg (not administered)    Or  acetaminophen (TYLENOL) suppository 650 mg (not administered)  ondansetron (ZOFRAN) tablet 4 mg (not administered)    Or  ondansetron (ZOFRAN) injection 4 mg (not administered)  albuterol (PROVENTIL) (2.5 MG/3ML) 0.083% nebulizer solution 2.5 mg (not administered)  insulin aspart (novoLOG) injection 0-9 Units (not administered)  ipratropium-albuterol (DUONEB) 0.5-2.5 (3) MG/3ML nebulizer solution 3 mL (not administered)  methylPREDNISolone sodium succinate (SOLU-MEDROL) 125 mg/2 mL injection 80 mg (not administered)  methylPREDNISolone sodium succinate (SOLU-MEDROL) 125 mg/2 mL injection 125 mg (125 mg Intravenous Given 10/15/17 2350)    Mobility walks

## 2017-10-17 LAB — GLUCOSE, CAPILLARY
GLUCOSE-CAPILLARY: 235 mg/dL — AB (ref 65–99)
Glucose-Capillary: 199 mg/dL — ABNORMAL HIGH (ref 65–99)
Glucose-Capillary: 258 mg/dL — ABNORMAL HIGH (ref 65–99)

## 2017-10-17 MED ORDER — IPRATROPIUM-ALBUTEROL 0.5-2.5 (3) MG/3ML IN SOLN: 3.0000 mL | Freq: Three times a day (TID) | RESPIRATORY_TRACT | 2 refills | Status: DC

## 2017-10-17 MED ORDER — AZITHROMYCIN 500 MG PO TABS: 500.0000 mg | ORAL_TABLET | Freq: Every day | ORAL | 0 refills | Status: DC

## 2017-10-17 MED ORDER — ASPIRIN EC 81 MG PO TBEC: 81.0000 mg | DELAYED_RELEASE_TABLET | Freq: Every day | ORAL | 3 refills | Status: DC

## 2017-10-17 MED ORDER — METHYLPREDNISOLONE SODIUM SUCC 125 MG IJ SOLR
INTRAMUSCULAR | Status: AC
  Administered 2017-10-17: 125 mg
  Filled 2017-10-17: qty 2

## 2017-10-17 MED ORDER — PREDNISONE 20 MG PO TABS: 40.0000 mg | ORAL_TABLET | Freq: Every day | ORAL | 0 refills | Status: DC

## 2017-10-17 MED ORDER — ALBUTEROL SULFATE HFA 108 (90 BASE) MCG/ACT IN AERS: 2.0000 | INHALATION_SPRAY | Freq: Four times a day (QID) | RESPIRATORY_TRACT | 6 refills | Status: DC | PRN

## 2017-10-17 MED ORDER — ALBUTEROL SULFATE (2.5 MG/3ML) 0.083% IN NEBU: 2.5000 mg | INHALATION_SOLUTION | Freq: Three times a day (TID) | RESPIRATORY_TRACT | 6 refills | Status: DC

## 2017-10-17 MED ORDER — IPRATROPIUM-ALBUTEROL 0.5-2.5 (3) MG/3ML IN SOLN
3.0000 mL | Freq: Four times a day (QID) | RESPIRATORY_TRACT | Status: DC
  Administered 2017-10-17: 3 mL via RESPIRATORY_TRACT
  Filled 2017-10-17: qty 3

## 2017-10-17 MED ORDER — AMLODIPINE BESYLATE 10 MG PO TABS: 10.0000 mg | ORAL_TABLET | Freq: Every day | ORAL | 3 refills | Status: DC

## 2017-10-17 MED ORDER — BUDESONIDE 0.5 MG/2ML IN SUSP: 0.5000 mg | Freq: Two times a day (BID) | RESPIRATORY_TRACT | 1 refills | Status: DC

## 2017-10-17 MED ORDER — GLIPIZIDE 5 MG PO TABS: 5.0000 mg | ORAL_TABLET | Freq: Two times a day (BID) | ORAL | 3 refills | Status: DC

## 2017-10-17 MED ORDER — INSULIN GLARGINE 100 UNIT/ML ~~LOC~~ SOLN: 15.0000 [IU] | Freq: Every day | SUBCUTANEOUS | 11 refills | Status: DC
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
## 2017-10-17 NOTE — Progress Notes (Signed)
Discharge instructions reviewed with patient. Patient verbalized understanding and discharged via private vehicle. 

## 2017-10-17 NOTE — Discharge Instructions (Signed)
1)Take Medications including breathing treatments as prescribed 2)Avoid tobacco smoke exposure 3)Drink enough fluids to avoid dehydration 4)Your oxygen levels are actually good 5)Keep an eye on your blood sugars as the prednisone may make your sugars go higher temporarily
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
## 2017-10-19 ENCOUNTER — Telehealth: Payer: Self-pay | Admitting: *Deleted

## 2017-10-19 ENCOUNTER — Telehealth: Payer: Self-pay | Admitting: Internal Medicine

## 2017-10-19 NOTE — Telephone Encounter (Signed)
Patient advised of dr crawfords note/instructions---patient is contacting insurance to see what humalog alternatives they will cover and call us back with list to choose from---when patient calls back, please provide list of alternatives for humalog to [place] office, can talk with Adedamola Seto,RN if any further questions

## 2017-10-19 NOTE — Telephone Encounter (Signed)
The nebulizer was prescribed by the hospital and there is not really a good alternative. She can use the albuterol nebulizer she already has and continue her anoro. The humalog pen has not changed (we have not prescribed since August) is this newly not covered? What are alternatives offered by insurance for humalog as there are many alternatives? Do they cover any insulin pens short acting?

## 2017-10-19 NOTE — Telephone Encounter (Signed)
Called pt to verify hosp f/u appt that has been schedule for tomorrow 10/20/17. Pt states she is aware she called this am. Inform pt need to ask some additional questions concerning discharge...completed TCM call below.Raechel Chute./lmb  Transition Care Management Follow-up Telephone Call  Date discharged? 10/17/17   How have you been since you were released from the hospital? Pt states she is feeling ok   Do you understand why you were in the hospital? YES   Do you understand the discharge instructions? YES   Where were you discharged to? Home   Items Reviewed:  Medications reviewed: YES  Allergies reviewed: YES  Dietary changes reviewed: NO  Referrals reviewed: No referral needed   Functional Questionnaire:   Activities of Daily Living (ADLs):   She states she are independent in the following: ambulation, bathing and hygiene, feeding, continence, grooming, toileting and dressing States she doesn't require assistance    Any transportation issues/concerns?: NO   Any patient concerns? NO   Confirmed importance and date/time of follow-up visits scheduled YES, Appt 10/20/17  Provider Appointment booked with Dr. Okey Duprerawford   Confirmed with patient if condition begins to worsen call PCP or go to the ER.  Patient was given the office number and encouraged to call back with question or concerns.  : YES

## 2017-10-19 NOTE — Telephone Encounter (Signed)
Copied from CRM [phone]. Topic: Quick Communication - See Telephone Encounter >> Oct 19, 2017  9:04 AM Guinevere FerrariMorris, Sharamare E, NT wrote: CRM for notification. See Telephone encounter for: Patient called and wanted  to see if the doctor could call her back regarding her medications insulin lispro (HUMALOG KWIKPEN) 100 UNIT/ML KiwkPen and budesonide (PULMICORT) 0.5 MG/2ML nebulizer solution.  10/19/17. >> Oct 19, 2017  1:27 PM Everardo PacificMoton, Kelly, [place] wrote: Patient called again.Stating that she is still waiting for a call back from Dr.Crawford about her medications. If she could give her a call at [phone]  Patient was just called right before this note came over---patient states her insurance will not cover either drug, I have routed this request to dr crawford to see if she can call something else in---her response is still pending

## 2017-10-19 NOTE — Telephone Encounter (Signed)
Copied from CRM [id number]. Topic: Quick Communication - See Telephone Encounter >> Oct 19, 2017  9:04 AM Guinevere FerrariMorris, Larsen Zettel E, NT wrote: CRM for notification. See Telephone encounter for: Patient called and wanted  to see if the doctor could call her back regarding her medications insulin lispro (HUMALOG KWIKPEN) 100 UNIT/ML KiwkPen and budesonide (PULMICORT) 0.5 MG/2ML nebulizer solution.  10/19/17.

## 2017-10-19 NOTE — Telephone Encounter (Signed)
Routing to dr Okey Duprecrawford, patient states her insurance will not cover either one of drugs listed in note---could you please change to something else and call in----thanks

## 2017-10-20 ENCOUNTER — Ambulatory Visit (INDEPENDENT_AMBULATORY_CARE_PROVIDER_SITE_OTHER): Payer: Medicare Other | Admitting: Internal Medicine

## 2017-10-20 ENCOUNTER — Encounter: Payer: Self-pay | Admitting: Internal Medicine

## 2017-10-20 DIAGNOSIS — J9601 Acute respiratory failure with hypoxia: Secondary | ICD-10-CM | POA: Diagnosis not present

## 2017-10-20 DIAGNOSIS — J449 Chronic obstructive pulmonary disease, unspecified: Secondary | ICD-10-CM | POA: Diagnosis not present

## 2017-10-20 DIAGNOSIS — Z720 Tobacco use: Secondary | ICD-10-CM | POA: Diagnosis not present

## 2017-10-20 NOTE — Assessment & Plan Note (Signed)
Cannot commit to stopping smoking. Doing duoneb scheduled TID. Could not afford pulmicort nebulizer. Lungs are improving overall. Continue prednisone until gone, antibiotics are done. Albuterol inhaler as needed. Continue Anoro and talked to her again about stopping smoking and she does not feel that this is possible at this time.

## 2017-10-20 NOTE — Patient Instructions (Signed)
We will get you the work note today. Keep doing the nebulizer 3 times a day for another 4-5 days and then use as needed but at bed time scheduled.

## 2017-10-20 NOTE — Assessment & Plan Note (Signed)
She was able to get duonebs and doing those scheduled TID. She will continue this for 4-5 more days, completed antibiotics and finish steroids. Once improving continue with evening nebulizer scheduled and daytime prn. Continue anoro.
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
## 2017-10-20 NOTE — Assessment & Plan Note (Signed)
Still smoking and due to stress does not feel able to commit to quitting at this time.

## 2018-01-21 ENCOUNTER — Telehealth: Payer: Self-pay | Admitting: Emergency Medicine

## 2018-01-21 NOTE — Telephone Encounter (Signed)
Called patient to schedule AWV. Kept getting busy signal. Will try patient back at later date.

## 2018-02-02 ENCOUNTER — Other Ambulatory Visit: Payer: Self-pay

## 2018-02-02 ENCOUNTER — Emergency Department (HOSPITAL_COMMUNITY): Payer: Medicare Other

## 2018-02-02 ENCOUNTER — Encounter (HOSPITAL_COMMUNITY): Payer: Self-pay

## 2018-02-02 ENCOUNTER — Ambulatory Visit (INDEPENDENT_AMBULATORY_CARE_PROVIDER_SITE_OTHER): Payer: Medicare Other | Admitting: Family

## 2018-02-02 ENCOUNTER — Inpatient Hospital Stay (HOSPITAL_COMMUNITY)
Admission: EM | Admit: 2018-02-02 | Discharge: 2018-02-06 | DRG: 190 | Disposition: A | Discharge status: Home Health | Payer: Medicare Other | Attending: Internal Medicine | Admitting: Internal Medicine

## 2018-02-02 ENCOUNTER — Encounter: Payer: Self-pay | Admitting: Family

## 2018-02-02 VITALS — BP 140/78 | HR 80 | Temp 98.2°F | Ht 62.0 in | Wt 183.0 lb

## 2018-02-02 DIAGNOSIS — E1169 Type 2 diabetes mellitus with other specified complication: Secondary | ICD-10-CM | POA: Diagnosis present

## 2018-02-02 DIAGNOSIS — F17210 Nicotine dependence, cigarettes, uncomplicated: Secondary | ICD-10-CM | POA: Diagnosis present

## 2018-02-02 DIAGNOSIS — Z91013 Allergy to seafood: Secondary | ICD-10-CM | POA: Diagnosis not present

## 2018-02-02 DIAGNOSIS — I10 Essential (primary) hypertension: Secondary | ICD-10-CM | POA: Diagnosis present

## 2018-02-02 DIAGNOSIS — Z888 Allergy status to other drugs, medicaments and biological substances status: Secondary | ICD-10-CM | POA: Diagnosis not present

## 2018-02-02 DIAGNOSIS — J9601 Acute respiratory failure with hypoxia: Secondary | ICD-10-CM | POA: Diagnosis not present

## 2018-02-02 DIAGNOSIS — I5032 Chronic diastolic (congestive) heart failure: Secondary | ICD-10-CM | POA: Diagnosis present

## 2018-02-02 DIAGNOSIS — J440 Chronic obstructive pulmonary disease with acute lower respiratory infection: Secondary | ICD-10-CM | POA: Diagnosis present

## 2018-02-02 DIAGNOSIS — R0902 Hypoxemia: Secondary | ICD-10-CM

## 2018-02-02 DIAGNOSIS — J441 Chronic obstructive pulmonary disease with (acute) exacerbation: Secondary | ICD-10-CM

## 2018-02-02 DIAGNOSIS — R0689 Other abnormalities of breathing: Secondary | ICD-10-CM

## 2018-02-02 DIAGNOSIS — Z833 Family history of diabetes mellitus: Secondary | ICD-10-CM | POA: Diagnosis not present

## 2018-02-02 DIAGNOSIS — E785 Hyperlipidemia, unspecified: Secondary | ICD-10-CM | POA: Diagnosis present

## 2018-02-02 DIAGNOSIS — Z7951 Long term (current) use of inhaled steroids: Secondary | ICD-10-CM

## 2018-02-02 DIAGNOSIS — I361 Nonrheumatic tricuspid (valve) insufficiency: Secondary | ICD-10-CM | POA: Diagnosis not present

## 2018-02-02 DIAGNOSIS — Z880 Allergy status to penicillin: Secondary | ICD-10-CM | POA: Diagnosis not present

## 2018-02-02 DIAGNOSIS — I110 Hypertensive heart disease with heart failure: Secondary | ICD-10-CM | POA: Diagnosis present

## 2018-02-02 DIAGNOSIS — J4489 Other specified chronic obstructive pulmonary disease: Secondary | ICD-10-CM | POA: Diagnosis present

## 2018-02-02 DIAGNOSIS — T380X5A Adverse effect of glucocorticoids and synthetic analogues, initial encounter: Secondary | ICD-10-CM | POA: Diagnosis not present

## 2018-02-02 DIAGNOSIS — R739 Hyperglycemia, unspecified: Secondary | ICD-10-CM

## 2018-02-02 DIAGNOSIS — M1711 Unilateral primary osteoarthritis, right knee: Secondary | ICD-10-CM | POA: Diagnosis present

## 2018-02-02 DIAGNOSIS — Z720 Tobacco use: Secondary | ICD-10-CM

## 2018-02-02 DIAGNOSIS — B9789 Other viral agents as the cause of diseases classified elsewhere: Secondary | ICD-10-CM | POA: Diagnosis present

## 2018-02-02 DIAGNOSIS — R05 Cough: Secondary | ICD-10-CM | POA: Diagnosis not present

## 2018-02-02 DIAGNOSIS — Z8249 Family history of ischemic heart disease and other diseases of the circulatory system: Secondary | ICD-10-CM

## 2018-02-02 DIAGNOSIS — J449 Chronic obstructive pulmonary disease, unspecified: Secondary | ICD-10-CM

## 2018-02-02 DIAGNOSIS — E1165 Type 2 diabetes mellitus with hyperglycemia: Secondary | ICD-10-CM | POA: Diagnosis present

## 2018-02-02 DIAGNOSIS — M1611 Unilateral primary osteoarthritis, right hip: Secondary | ICD-10-CM | POA: Diagnosis present

## 2018-02-02 DIAGNOSIS — Z96651 Presence of right artificial knee joint: Secondary | ICD-10-CM | POA: Diagnosis present

## 2018-02-02 DIAGNOSIS — R0602 Shortness of breath: Secondary | ICD-10-CM | POA: Diagnosis not present

## 2018-02-02 DIAGNOSIS — Z87891 Personal history of nicotine dependence: Secondary | ICD-10-CM | POA: Diagnosis present

## 2018-02-02 DIAGNOSIS — Z794 Long term (current) use of insulin: Secondary | ICD-10-CM | POA: Diagnosis not present

## 2018-02-02 DIAGNOSIS — E099 Drug or chemical induced diabetes mellitus without complications: Secondary | ICD-10-CM | POA: Diagnosis present

## 2018-02-02 DIAGNOSIS — E118 Type 2 diabetes mellitus with unspecified complications: Secondary | ICD-10-CM | POA: Diagnosis present

## 2018-02-02 LAB — URINALYSIS, ROUTINE W REFLEX MICROSCOPIC
BACTERIA UA: NONE SEEN
Bilirubin Urine: NEGATIVE
Glucose, UA: NEGATIVE mg/dL
Hgb urine dipstick: NEGATIVE
Ketones, ur: 20 mg/dL — AB
Nitrite: NEGATIVE
PROTEIN: 30 mg/dL — AB
SPECIFIC GRAVITY, URINE: 1.026 (ref 1.005–1.030)
pH: 5 (ref 5.0–8.0)

## 2018-02-02 LAB — COMPREHENSIVE METABOLIC PANEL
ALBUMIN: 4 g/dL (ref 3.5–5.0)
ALK PHOS: 65 U/L (ref 38–126)
ALT: 15 U/L (ref 14–54)
AST: 23 U/L (ref 15–41)
Anion gap: 10 (ref 5–15)
BUN: 18 mg/dL (ref 6–20)
CALCIUM: 8.9 mg/dL (ref 8.9–10.3)
CHLORIDE: 100 mmol/L — AB (ref 101–111)
CO2: 33 mmol/L — AB (ref 22–32)
CREATININE: 0.63 mg/dL (ref 0.44–1.00)
GFR calc non Af Amer: >60 mL/min (ref >60)
GLUCOSE: 145 mg/dL — AB (ref 65–99)
Potassium: 4.2 mmol/L (ref 3.5–5.1)
SODIUM: 143 mmol/L (ref 135–145)
Total Bilirubin: 0.6 mg/dL (ref 0.3–1.2)
Total Protein: 7.4 g/dL (ref 6.5–8.1)

## 2018-02-02 LAB — I-STAT TROPONIN, ED
TROPONIN I, POC: 0 ng/mL (ref 0.00–0.08)
TROPONIN I, POC: 0.02 ng/mL (ref 0.00–0.08)

## 2018-02-02 LAB — CBC WITH DIFFERENTIAL/PLATELET
BASOS ABS: 0 10*3/uL (ref 0.0–0.1)
Basophils Relative: 0 %
EOS ABS: 0.1 10*3/uL (ref 0.0–0.7)
Eosinophils Relative: 1 %
HCT: 46.6 % — ABNORMAL HIGH (ref 36.0–46.0)
HEMOGLOBIN: 15.2 g/dL — AB (ref 12.0–15.0)
LYMPHS ABS: 2.2 10*3/uL (ref 0.7–4.0)
Lymphocytes Relative: 32 %
MCH: 31 pg (ref 26.0–34.0)
MCHC: 32.6 g/dL (ref 30.0–36.0)
MCV: 94.9 fL (ref 78.0–100.0)
Monocytes Absolute: 0.6 10*3/uL (ref 0.1–1.0)
Monocytes Relative: 8 %
NEUTROS PCT: 59 %
Neutro Abs: 4 10*3/uL (ref 1.7–7.7)
Platelets: 262 10*3/uL (ref 150–400)
RBC: 4.91 MIL/uL (ref 3.87–5.11)
RDW: 13.6 % (ref 11.5–15.5)
WBC: 6.8 10*3/uL (ref 4.0–10.5)

## 2018-02-02 LAB — GLUCOSE, CAPILLARY: GLUCOSE-CAPILLARY: 311 mg/dL — AB (ref 65–99)

## 2018-02-02 LAB — LIPASE, BLOOD: Lipase: 22 U/L (ref 11–51)

## 2018-02-02 LAB — BRAIN NATRIURETIC PEPTIDE: B NATRIURETIC PEPTIDE 5: 22.7 pg/mL (ref 0.0–100.0)

## 2018-02-02 MED ORDER — DIPHENHYDRAMINE HCL 50 MG PO CAPS
50.0000 mg | ORAL_CAPSULE | Freq: Every day | ORAL | Status: DC
  Administered 2018-02-02 – 2018-02-05 (×4): 50 mg via ORAL
  Filled 2018-02-02 (×4): qty 1

## 2018-02-02 MED ORDER — CALCIUM CARBONATE 1250 (500 CA) MG PO TABS
1.0000 | ORAL_TABLET | Freq: Two times a day (BID) | ORAL | Status: DC
  Administered 2018-02-02 – 2018-02-06 (×8): 500 mg via ORAL
  Filled 2018-02-02 (×8): qty 1

## 2018-02-02 MED ORDER — CALCIUM CARBONATE 600 MG PO TABS: 600.0000 mg | ORAL_TABLET | Freq: Two times a day (BID) | ORAL | Status: DC

## 2018-02-02 MED ORDER — ACETAMINOPHEN 650 MG RE SUPP: 650.0000 mg | Freq: Four times a day (QID) | RECTAL | Status: DC | PRN

## 2018-02-02 MED ORDER — AMLODIPINE BESYLATE 10 MG PO TABS
10.0000 mg | ORAL_TABLET | Freq: Every day | ORAL | Status: DC
  Administered 2018-02-02 – 2018-02-06 (×5): 10 mg via ORAL
  Filled 2018-02-02 (×5): qty 1

## 2018-02-02 MED ORDER — IPRATROPIUM-ALBUTEROL 0.5-2.5 (3) MG/3ML IN SOLN
3.0000 mL | Freq: Once | RESPIRATORY_TRACT | Status: AC
  Administered 2018-02-02: 3 mL via RESPIRATORY_TRACT
  Filled 2018-02-02: qty 3

## 2018-02-02 MED ORDER — FLUTICASONE PROPIONATE 50 MCG/ACT NA SUSP
2.0000 | Freq: Every day | NASAL | Status: DC
  Administered 2018-02-02 – 2018-02-06 (×5): 2 via NASAL
  Filled 2018-02-02: qty 16

## 2018-02-02 MED ORDER — ALBUTEROL SULFATE (2.5 MG/3ML) 0.083% IN NEBU: 2.5000 mg | INHALATION_SOLUTION | RESPIRATORY_TRACT | Status: DC | PRN

## 2018-02-02 MED ORDER — ACETAMINOPHEN 325 MG PO TABS
650.0000 mg | ORAL_TABLET | Freq: Four times a day (QID) | ORAL | Status: DC | PRN
  Administered 2018-02-03: 650 mg via ORAL
  Filled 2018-02-02 (×2): qty 2

## 2018-02-02 MED ORDER — GUAIFENESIN ER 600 MG PO TB12
600.0000 mg | ORAL_TABLET | Freq: Two times a day (BID) | ORAL | Status: DC
  Administered 2018-02-02 – 2018-02-06 (×8): 600 mg via ORAL
  Filled 2018-02-02 (×8): qty 1

## 2018-02-02 MED ORDER — ALBUTEROL SULFATE (2.5 MG/3ML) 0.083% IN NEBU
2.5000 mg | INHALATION_SOLUTION | Freq: Once | RESPIRATORY_TRACT | Status: DC
  Administered 2018-02-02: 2.5 mg via RESPIRATORY_TRACT

## 2018-02-02 MED ORDER — INSULIN ASPART 100 UNIT/ML ~~LOC~~ SOLN
0.0000 [IU] | Freq: Every day | SUBCUTANEOUS | Status: DC
  Administered 2018-02-02: 4 [IU] via SUBCUTANEOUS
  Administered 2018-02-03: 2 [IU] via SUBCUTANEOUS
  Administered 2018-02-04: 3 [IU] via SUBCUTANEOUS
  Administered 2018-02-05: 2 [IU] via SUBCUTANEOUS

## 2018-02-02 MED ORDER — INSULIN DETEMIR 100 UNIT/ML ~~LOC~~ SOLN
10.0000 [IU] | Freq: Every day | SUBCUTANEOUS | Status: DC
  Administered 2018-02-02 – 2018-02-03 (×2): 10 [IU] via SUBCUTANEOUS
  Filled 2018-02-02 (×2): qty 0.1

## 2018-02-02 MED ORDER — MELATONIN 5 MG PO TABS
5.0000 mg | ORAL_TABLET | Freq: Every evening | ORAL | Status: DC | PRN
  Administered 2018-02-02 – 2018-02-05 (×2): 5 mg via ORAL
  Filled 2018-02-02 (×2): qty 1

## 2018-02-02 MED ORDER — ENOXAPARIN SODIUM 40 MG/0.4ML ~~LOC~~ SOLN
40.0000 mg | SUBCUTANEOUS | Status: DC
  Administered 2018-02-02 – 2018-02-05 (×4): 40 mg via SUBCUTANEOUS
  Filled 2018-02-02 (×4): qty 0.4

## 2018-02-02 MED ORDER — IPRATROPIUM-ALBUTEROL 0.5-2.5 (3) MG/3ML IN SOLN
3.0000 mL | Freq: Four times a day (QID) | RESPIRATORY_TRACT | Status: DC
  Administered 2018-02-03 – 2018-02-04 (×5): 3 mL via RESPIRATORY_TRACT
  Filled 2018-02-02 (×5): qty 3

## 2018-02-02 MED ORDER — CHLORHEXIDINE GLUCONATE 0.12 % MT SOLN
15.0000 mL | Freq: Two times a day (BID) | OROMUCOSAL | Status: DC
  Administered 2018-02-02 – 2018-02-06 (×8): 15 mL via OROMUCOSAL
  Filled 2018-02-02 (×8): qty 15

## 2018-02-02 MED ORDER — ATORVASTATIN CALCIUM 40 MG PO TABS
40.0000 mg | ORAL_TABLET | Freq: Every day | ORAL | Status: DC
  Administered 2018-02-02 – 2018-02-05 (×4): 40 mg via ORAL
  Filled 2018-02-02 (×4): qty 1

## 2018-02-02 MED ORDER — IPRATROPIUM-ALBUTEROL 0.5-2.5 (3) MG/3ML IN SOLN
3.0000 mL | RESPIRATORY_TRACT | Status: DC
  Administered 2018-02-02: 3 mL via RESPIRATORY_TRACT

## 2018-02-02 MED ORDER — AZITHROMYCIN 250 MG PO TABS
500.0000 mg | ORAL_TABLET | Freq: Every day | ORAL | Status: AC
  Administered 2018-02-02 – 2018-02-04 (×3): 500 mg via ORAL
  Filled 2018-02-02 (×3): qty 2

## 2018-02-02 MED ORDER — ASPIRIN EC 81 MG PO TBEC
81.0000 mg | DELAYED_RELEASE_TABLET | Freq: Every day | ORAL | Status: DC
  Administered 2018-02-02 – 2018-02-06 (×5): 81 mg via ORAL
  Filled 2018-02-02 (×5): qty 1

## 2018-02-02 MED ORDER — METHYLPREDNISOLONE SODIUM SUCC 125 MG IJ SOLR
80.0000 mg | Freq: Two times a day (BID) | INTRAMUSCULAR | Status: DC
  Administered 2018-02-02 – 2018-02-04 (×4): 80 mg via INTRAVENOUS
  Filled 2018-02-02 (×4): qty 2

## 2018-02-02 MED ORDER — METHYLPREDNISOLONE SODIUM SUCC 125 MG IJ SOLR
125.0000 mg | Freq: Once | INTRAMUSCULAR | Status: AC
  Administered 2018-02-02: 125 mg via INTRAVENOUS
  Filled 2018-02-02: qty 2

## 2018-02-02 MED ORDER — INSULIN ASPART 100 UNIT/ML ~~LOC~~ SOLN
0.0000 [IU] | Freq: Three times a day (TID) | SUBCUTANEOUS | Status: DC
  Administered 2018-02-03 (×2): 15 [IU] via SUBCUTANEOUS
  Administered 2018-02-03: 7 [IU] via SUBCUTANEOUS
  Administered 2018-02-04: 11 [IU] via SUBCUTANEOUS
  Administered 2018-02-04 (×2): 7 [IU] via SUBCUTANEOUS
  Administered 2018-02-05: 11 [IU] via SUBCUTANEOUS
  Administered 2018-02-05: 4 [IU] via SUBCUTANEOUS
  Administered 2018-02-05 – 2018-02-06 (×3): 7 [IU] via SUBCUTANEOUS

## 2018-02-02 MED ORDER — ORAL CARE MOUTH RINSE
15.0000 mL | Freq: Two times a day (BID) | OROMUCOSAL | Status: DC
  Administered 2018-02-03 – 2018-02-06 (×6): 15 mL via OROMUCOSAL

## 2018-02-02 MED ORDER — POLYVINYL ALCOHOL 1.4 % OP SOLN
2.0000 [drp] | OPHTHALMIC | Status: DC | PRN
Start: 2018-02-02 — End: 2018-02-06
  Filled 2018-02-02: qty 15

## 2018-02-02 NOTE — ED Triage Notes (Signed)
Patient comes from [place] to see primary doctor as patient wasn't feeling well since Thursday. Patient was 83% on RA so they sent to ED. Patient 78% on arrival to ED- now on 3L oxygen sat 100%. 1 albuterol treatment was given in office. This has happened before.

## 2018-02-02 NOTE — ED Notes (Signed)
Transport called.
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
## 2018-02-02 NOTE — ED Notes (Signed)
ED TO INPATIENT HANDOFF REPORT  Name/Age/Gender Yvonne Todd 67 y.o. female  Code Status Code Status History    Date Active Date Inactive Code Status Order ID Comments User Context   10/15/2017 2324 10/17/2017 2031/11/23 Full Code [id number]  Phillips Grout, MD ED   04/13/2017 22-Nov-2140 04/15/2017 1630 Full Code [id number]  Toy Baker, MD Inpatient   05/23/2016 1804 05/31/2016 1748 Full Code [id number]  Mariel Aloe, MD Inpatient   06/18/2015 2226 06/23/2015 1901 Full Code [id number]  Geradine Girt, DO Inpatient   07/03/2014 1425 07/07/2014 0009 Full Code [id number]  [name] Hedges, PA-C Inpatient   03/19/2014 1828 03/20/2014 1937 Full Code [id number]  Cristal Ford, DO Inpatient    Advance Directive Documentation     Most Recent Value  Type of Advance Directive  Living will  Pre-existing out of facility DNR order (yellow form or pink MOST form)  -  "MOST" Form in Place?  -      Home/SNF/Other home  Chief Complaint SHOB   Level of Care/Admitting Diagnosis ED Disposition    ED Disposition Condition [place] [100102]  Level of Care: Med-Surg [16]  Diagnosis: COPD exacerbation [facility]) [619509]  Admitting Physician: Mariel Aloe [phone]  Attending Physician: Mariel Aloe [phone]  Estimated length of stay: past midnight tomorrow  Certification:: I certify this patient will need inpatient services for at least 2 midnights  PT Class (Do Not Modify): Inpatient [101]  PT Acc Code (Do Not Modify): Private [1]       Medical History Past Medical History:  Diagnosis Date  . Allergy    Iodine, Penicillin, Tetracycline.  Patient reports reaction of hives, rash, no sob, no wheezing   . Arthritis    .Right knee, right hip  . Asthma   . Depression    Past year depression not sleeping after fiancee deceased November 22, 2012  . Diabetes ([place])   . Hyperlipidemia    Dx 03/2014  . Hypertension    Dx 03/2014  . Pneumonia    hx of  recently po antibiotics  . Primary localized osteoarthritis of right knee     Allergies Allergies  Allergen Reactions  . Fish Allergy Rash and Other (See Comments)    Some types of fish with iodine causes rash  . Iodine Rash  . Penicillins Rash and Other (See Comments)    Has patient had a PCN reaction causing immediate rash, facial/tongue/throat swelling, SOB or lightheadedness with hypotension: yes Has patient had a PCN reaction causing severe rash involving mucus membranes or skin necrosis: no Has patient had a PCN reaction that required hospitalization: no Has patient had a PCN reaction occurring within the last 10 years: no If all of the above answers are "NO", then may proceed with Cephalosporin use.   . Tetracyclines & Related Rash    IV Location/Drains/Wounds Patient Lines/Drains/Airways Status   Active Line/Drains/Airways    Name:   Placement date:   Placement time:   Site:   Days:   Peripheral IV 10/15/17 Right;Upper Arm   10/15/17    2345    Arm   110          Labs/Imaging Results for orders placed or performed during the hospital encounter of 02/02/18 (from the past 48 hour(s))  Brain natriuretic peptide     Status: None   Collection Time: 02/02/18 10:42 AM  Result Value Ref Range   B Natriuretic Peptide 22.7 0.0 -  100.0 pg/mL    Comment: Performed at [facility], [place] [address], [place], [place] [address]  CBC with Differential     Status: Abnormal   Collection Time: 02/02/18 10:42 AM  Result Value Ref Range   WBC 6.8 4.0 - 10.5 K/uL   RBC 4.91 3.87 - 5.11 MIL/uL   Hemoglobin 15.2 (H) 12.0 - 15.0 g/dL   HCT 46.6 (H) 36.0 - 46.0 %   MCV 94.9 78.0 - 100.0 fL   MCH 31.0 26.0 - 34.0 pg   MCHC 32.6 30.0 - 36.0 g/dL   RDW 13.6 11.5 - 15.5 %   Platelets 262 150 - 400 K/uL   Neutrophils Relative % 59 %   Neutro Abs 4.0 1.7 - 7.7 K/uL   Lymphocytes Relative 32 %   Lymphs Abs 2.2 0.7 - 4.0 K/uL   Monocytes Relative 8 %   Monocytes Absolute  0.6 0.1 - 1.0 K/uL   Eosinophils Relative 1 %   Eosinophils Absolute 0.1 0.0 - 0.7 K/uL   Basophils Relative 0 %   Basophils Absolute 0.0 0.0 - 0.1 K/uL    Comment: Performed at [facility], [place] [address], [place], [place] [address]  Comprehensive metabolic panel     Status: Abnormal   Collection Time: 02/02/18 10:42 AM  Result Value Ref Range   Sodium 143 135 - 145 mmol/L   Potassium 4.2 3.5 - 5.1 mmol/L   Chloride 100 (L) 101 - 111 mmol/L   CO2 33 (H) 22 - 32 mmol/L   Glucose, Bld 145 (H) 65 - 99 mg/dL   BUN 18 6 - 20 mg/dL   Creatinine, Ser 0.63 0.44 - 1.00 mg/dL   Calcium 8.9 8.9 - 10.3 mg/dL   Total Protein 7.4 6.5 - 8.1 g/dL   Albumin 4.0 3.5 - 5.0 g/dL   AST 23 15 - 41 U/L   ALT 15 14 - 54 U/L   Alkaline Phosphatase 65 38 - 126 U/L   Total Bilirubin 0.6 0.3 - 1.2 mg/dL   GFR calc non Af Amer >60 >60 mL/min   GFR calc Af Amer >60 >60 mL/min    Comment: (NOTE) The eGFR has been calculated using the CKD EPI equation. This calculation has not been validated in all clinical situations. eGFR's persistently <60 mL/min signify possible Chronic Kidney Disease.    Anion gap 10 5 - 15    Comment: Performed at [facility], [place] [address], [place], [place] [address]  Lipase, blood     Status: None   Collection Time: 02/02/18 10:42 AM  Result Value Ref Range   Lipase 22 11 - 51 U/L    Comment: Performed at [facility], [place] [address] [place], [place] [address]  Urinalysis, Routine w reflex microscopic     Status: Abnormal   Collection Time: 02/02/18 10:42 AM  Result Value Ref Range   Color, Urine YELLOW YELLOW   APPearance CLEAR CLEAR   Specific Gravity, Urine 1.026 1.005 - 1.030   pH 5.0 5.0 - 8.0   Glucose, UA NEGATIVE NEGATIVE mg/dL   Hgb urine dipstick NEGATIVE NEGATIVE   Bilirubin Urine NEGATIVE NEGATIVE   Ketones, ur 20 (A) NEGATIVE mg/dL   Protein, ur 30 (A) NEGATIVE mg/dL   Nitrite NEGATIVE NEGATIVE    Leukocytes, UA MODERATE (A) NEGATIVE   RBC / HPF 0-5 0 - 5 RBC/hpf   WBC, UA 21-50 0 - 5 WBC/hpf   Bacteria, UA NONE  SEEN NONE SEEN   Squamous Epithelial / LPF 0-5 0 - 5   Mucus PRESENT     Comment: Performed at [facility], [place] [address], [place], [place] [address]  I-Stat Troponin, ED (not at [facility])     Status: None   Collection Time: 02/02/18 10:57 AM  Result Value Ref Range   Troponin i, poc 0.00 0.00 - 0.08 ng/mL   Comment 3            Comment: Due to the release kinetics of cTnI, a negative result within the first hours of the onset of symptoms does not rule out myocardial infarction with certainty. If myocardial infarction is still suspected, repeat the test at appropriate intervals.   I-Stat Troponin, ED (not at [facility])     Status: None   Collection Time: 02/02/18  1:47 PM  Result Value Ref Range   Troponin i, poc 0.02 0.00 - 0.08 ng/mL   Comment 3            Comment: Due to the release kinetics of cTnI, a negative result within the first hours of the onset of symptoms does not rule out myocardial infarction with certainty. If myocardial infarction is still suspected, repeat the test at appropriate intervals.    Dg Chest 2 View  Result Date: 02/02/2018 CLINICAL DATA:  Hypoxia, shortness of breath, productive cough EXAM: CHEST - 2 VIEW COMPARISON:  10/15/2017 FINDINGS: Heart and mediastinal contours are within normal limits. No focal opacities or effusions. No acute bony abnormality. IMPRESSION: No active cardiopulmonary disease. Electronically Signed   By: Rolm Baptise M.D.   On: 02/02/2018 11:32    Pending Labs Unresulted Labs (From admission, onward)   Start     Ordered   02/02/18 1025  Urine culture  STAT,   STAT     02/02/18 1024   Signed and Held  Creatinine, serum  (enoxaparin (LOVENOX)    CrCl >/= 30 ml/min)  Weekly,   R    Comments:  while on enoxaparin therapy    Signed and Held   Signed and Held  HIV antibody (Routine Testing)  Tomorrow  morning,   R     Signed and Held   Signed and Held  Culture, sputum-assessment  Once,   R     Signed and Held   Signed and Held  Basic metabolic panel  Tomorrow morning,   R     Signed and Held   Signed and Held  CBC  Tomorrow morning,   R     Signed and Held      Vitals/Pain Today's Vitals   02/02/18 1339 02/02/18 1430 02/02/18 1500 02/02/18 1530  BP: (!) 128/54 (!) 147/69 (!) 156/62 118/72  Pulse: 63 64 66 68  Resp: '16 16 16 16  '$ Temp:      TempSrc:      SpO2: 95% (!) 89% 99% 94%  PainSc:        Isolation Precautions No active isolations  Medications Medications  ipratropium-albuterol (DUONEB) 0.5-2.5 (3) MG/3ML nebulizer solution 3 mL (3 mLs Nebulization Given 02/02/18 1056)  methylPREDNISolone sodium succinate (SOLU-MEDROL) 125 mg/2 mL injection 125 mg (125 mg Intravenous Given 02/02/18 1049)  ipratropium-albuterol (DUONEB) 0.5-2.5 (3) MG/3ML nebulizer solution 3 mL (3 mLs Nebulization Given 02/02/18 1450)    Mobility ambulatory
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
## 2018-02-03 DIAGNOSIS — R0902 Hypoxemia: Secondary | ICD-10-CM

## 2018-02-03 LAB — BASIC METABOLIC PANEL
Anion gap: 9 (ref 5–15)
BUN: 20 mg/dL (ref 6–20)
CHLORIDE: 99 mmol/L — AB (ref 101–111)
CO2: 33 mmol/L — ABNORMAL HIGH (ref 22–32)
Calcium: 9.3 mg/dL (ref 8.9–10.3)
Creatinine, Ser: 0.61 mg/dL (ref 0.44–1.00)
GFR calc Af Amer: >60 mL/min (ref >60)
GFR calc non Af Amer: >60 mL/min (ref >60)
GLUCOSE: 285 mg/dL — AB (ref 65–99)
Potassium: 4.4 mmol/L (ref 3.5–5.1)
Sodium: 141 mmol/L (ref 135–145)

## 2018-02-03 LAB — URINE CULTURE

## 2018-02-03 LAB — RESPIRATORY PANEL BY PCR
ADENOVIRUS-RVPPCR: NOT DETECTED
Bordetella pertussis: NOT DETECTED
CORONAVIRUS NL63-RVPPCR: NOT DETECTED
Chlamydophila pneumoniae: NOT DETECTED
Coronavirus 229E: NOT DETECTED
Coronavirus HKU1: NOT DETECTED
Coronavirus OC43: NOT DETECTED
Influenza A: NOT DETECTED
Influenza B: NOT DETECTED
METAPNEUMOVIRUS-RVPPCR: NOT DETECTED
Mycoplasma pneumoniae: NOT DETECTED
PARAINFLUENZA VIRUS 1-RVPPCR: NOT DETECTED
PARAINFLUENZA VIRUS 2-RVPPCR: NOT DETECTED
Parainfluenza Virus 3: NOT DETECTED
Parainfluenza Virus 4: NOT DETECTED
RHINOVIRUS / ENTEROVIRUS - RVPPCR: DETECTED — AB
Respiratory Syncytial Virus: NOT DETECTED

## 2018-02-03 LAB — CBC
HEMATOCRIT: 44.5 % (ref 36.0–46.0)
HEMOGLOBIN: 14.4 g/dL (ref 12.0–15.0)
MCH: 30.9 pg (ref 26.0–34.0)
MCHC: 32.4 g/dL (ref 30.0–36.0)
MCV: 95.5 fL (ref 78.0–100.0)
Platelets: 294 10*3/uL (ref 150–400)
RBC: 4.66 MIL/uL (ref 3.87–5.11)
RDW: 13.3 % (ref 11.5–15.5)
WBC: 5.5 10*3/uL (ref 4.0–10.5)

## 2018-02-03 LAB — HIV ANTIBODY (ROUTINE TESTING W REFLEX): HIV Screen 4th Generation wRfx: NONREACTIVE

## 2018-02-03 LAB — GLUCOSE, CAPILLARY
GLUCOSE-CAPILLARY: 302 mg/dL — AB (ref 65–99)
Glucose-Capillary: 241 mg/dL — ABNORMAL HIGH (ref 65–99)
Glucose-Capillary: 327 mg/dL — ABNORMAL HIGH (ref 65–99)
Glucose-Capillary: 219 mg/dL — ABNORMAL HIGH (ref 65–99)

## 2018-02-03 MED ORDER — BUDESONIDE 0.5 MG/2ML IN SUSP
0.5000 mg | Freq: Two times a day (BID) | RESPIRATORY_TRACT | Status: DC
  Administered 2018-02-03 – 2018-02-06 (×7): 0.5 mg via RESPIRATORY_TRACT
  Filled 2018-02-03 (×7): qty 2

## 2018-02-03 NOTE — Progress Notes (Signed)
Inpatient Diabetes Program Recommendations  AACE/ADA: New Consensus Statement on Inpatient Glycemic Control (2015)  Target Ranges:  Prepandial:   less than 140 mg/dL      Peak postprandial:   less than 180 mg/dL (1-2 hours)      Critically ill patients:  140 - 180 mg/dL   Lab Results  Component Value Date   GLUCAP 327 (H) 02/03/2018   HGBA1C 6.7 (H) 10/02/2017    Review of Glycemic Control  Diabetes history: DM2 Outpatient Diabetes medications: glipizide 5 mg bid Current orders for Inpatient glycemic control: Levemir 10 units QHS, Novolog 0-20 units tidwc and hs  On Solumedrol 80 mg Q12H. Blood sugars in 200s.  Needs updated HgbA1C.  Inpatient Diabetes Program Recommendations:     Increase Levemir to 15 units QHS Add Novolog 4 units tidwc for meal coverage insulin while on steroids. Needs updated HgbA1C to assess glycemic control PTA  Will speak with pt regarding his glycemic control this afternoon.  Thank you. Ailene Ardshonda Croix Presley, RD, LDN, CDE Inpatient Diabetes Coordinator [phone]

## 2018-02-03 NOTE — Progress Notes (Signed)
Patient ID: Yvonne Todd, female   DOB: 22-Mar-1951, 67 y.o.   MRN: [id number]  PROGRESS NOTE    Yvonne Todd  [id number]:[id number] DOB: 08-Feb-1951 DOA: 02/02/2018 PCP: Myrlene Broker, MD   Brief Narrative:  67 year old female with history of COPD, diabetes mellitus, hyperlipidemia, hypertension presented with worsening shortness of breath.  She was admitted with COPD exacerbation and started on Solu-Medrol. Assessment & Plan:   Principal Problem:   COPD exacerbation (HCC) Active Problems:   Tobacco abuse   Dyslipidemia   Steroid-induced hyperglycemia   Essential hypertension   COPD (chronic obstructive pulmonary disease) with chronic bronchitis (HCC)   Type 2 diabetes mellitus with hyperlipidemia (HCC)   COPD exacerbation -Continue Solu-Medrol.  Continue duo nebs.  Add Pulmicort -Incentive spirometry -Outpatient follow-up with pulmonary -If respiratory status does not improve, might need CT scan of the chest  Hypoxia -Probably secondary to above.  Wean oxygen as able  Diabetes mellitus type 2 -Continue Levemir and Accu-Cheks with coverage.  Oral meds on hold.  Hyperlipidemia -Continue Lipitor  Essential hypertension -Monitor blood pressure.  Continue amlodipine   DVT prophylaxis: Lovenox Code Status: Full Family Communication: None at bedside Disposition Plan: Home in 1 to 3 days  Consultants: None  Procedures: None  Antimicrobials: Zithromax   Subjective: Patient seen and examined at bedside.  She feels slightly better but is still short of breath with exertion.  Still is coughing.  No overnight fever or vomiting.  Objective: Vitals:   02/03/18 0208 02/03/18 0411 02/03/18 0803 02/03/18 0957  BP:  (!) 130/49    Pulse:  70    Resp:  (!) 22    Temp:  97.6 F (36.4 C)    TempSrc:  Oral    SpO2: 96% 99% 99% 98%  Weight:      Height:        Intake/Output Summary (Last 24 hours) at 02/03/2018 1100 Last data filed at 02/03/2018 1002 Gross per 24 hour    Intake 600 ml  Output -  Net 600 ml   Filed Weights   02/02/18 1818  Weight: 83 kg (183 lb)    Examination:  General exam: Appears calm and comfortable  Respiratory system: Bilateral decreased breath sound at bases with scattered crackles, very minimal wheezing he had mild tachypnea Cardiovascular system: S1 & S2 heard, controlled  gastrointestinal system: Abdomen is nondistended, soft and nontender. Normal bowel sounds heard. Extremities: No cyanosis, clubbing, edema    Data Reviewed: I have personally reviewed following labs and imaging studies  CBC: Recent Labs  Lab 02/02/18 1042 02/03/18 0555  WBC 6.8 5.5  NEUTROABS 4.0  --   HGB 15.2* 14.4  HCT 46.6* 44.5  MCV 94.9 95.5  PLT 262 294   Basic Metabolic Panel: Recent Labs  Lab 02/02/18 1042 02/03/18 0555  NA 143 141  K 4.2 4.4  CL 100* 99*  CO2 33* 33*  GLUCOSE 145* 285*  BUN 18 20  CREATININE 0.63 0.61  CALCIUM 8.9 9.3   GFR: Estimated Creatinine Clearance: 68.2 mL/min (by C-G formula based on SCr of 0.61 mg/dL). Liver Function Tests: Recent Labs  Lab 02/02/18 1042  AST 23  ALT 15  ALKPHOS 65  BILITOT 0.6  PROT 7.4  ALBUMIN 4.0   Recent Labs  Lab 02/02/18 1042  LIPASE 22   No results for input(s): AMMONIA in the last 168 hours. Coagulation Profile: No results for input(s): INR, PROTIME in the last 168 hours. Cardiac Enzymes: No results for  input(s): CKTOTAL, CKMB, CKMBINDEX, TROPONINI in the last 168 hours. BNP (last 3 results) No results for input(s): PROBNP in the last 8760 hours. HbA1C: No results for input(s): HGBA1C in the last 72 hours. CBG: Recent Labs  Lab 02/02/18 2059 02/03/18 0807  GLUCAP 311* 241*   Lipid Profile: No results for input(s): CHOL, HDL, LDLCALC, TRIG, CHOLHDL, LDLDIRECT in the last 72 hours. Thyroid Function Tests: No results for input(s): TSH, T4TOTAL, FREET4, T3FREE, THYROIDAB in the last 72 hours. Anemia Panel: No results for input(s): VITAMINB12,  FOLATE, FERRITIN, TIBC, IRON, RETICCTPCT in the last 72 hours. Sepsis Labs: No results for input(s): PROCALCITON, LATICACIDVEN in the last 168 hours.  Recent Results (from the past 240 hour(s))  Urine culture     Status: Abnormal   Collection Time: 02/02/18 10:42 AM  Result Value Ref Range Status   Specimen Description   Final    URINE, RANDOM Performed at [facility], [address] [place], [place] [address]    Special Requests   Final    NONE Performed at [facility], [address] [place], [place] [address]    Culture MULTIPLE SPECIES PRESENT, SUGGEST RECOLLECTION (A)  Final   Report Status 02/03/2018 FINAL  Final         Radiology Studies: Dg Chest 2 View  Result Date: 02/02/2018 CLINICAL DATA:  Hypoxia, shortness of breath, productive cough EXAM: CHEST - 2 VIEW COMPARISON:  10/15/2017 FINDINGS: Heart and mediastinal contours are within normal limits. No focal opacities or effusions. No acute bony abnormality. IMPRESSION: No active cardiopulmonary disease. Electronically Signed   By: Charlett NoseKevin  Dover M.D.   On: 02/02/2018 11:32        Scheduled Meds: . amLODipine  10 mg Oral Daily  . aspirin EC  81 mg Oral Daily  . atorvastatin  40 mg Oral QHS  . azithromycin  500 mg Oral Daily  . budesonide (PULMICORT) nebulizer solution  0.5 mg Nebulization BID  . calcium carbonate  1 tablet Oral BID WC  . chlorhexidine  15 mL Mouth Rinse BID  . diphenhydrAMINE  50 mg Oral QHS  . enoxaparin (LOVENOX) injection  40 mg Subcutaneous Q24H  . fluticasone  2 spray Each Nare Daily  . guaiFENesin  600 mg Oral BID  . insulin aspart  0-20 Units Subcutaneous TID WC  . insulin aspart  0-5 Units Subcutaneous QHS  . insulin detemir  10 Units Subcutaneous QHS  . ipratropium-albuterol  3 mL Nebulization Q6H  . mouth rinse  15 mL Mouth Rinse q12n4p  . methylPREDNISolone (SOLU-MEDROL) injection  80 mg Intravenous Q12H   Continuous Infusions:   LOS: 1  day        Glade LloydKshitiz Sullivan Jacuinde, MD [facility] Pager [phone]  If 7PM-7AM, please contact night-coverage [email or web address] Password [email or web address] 02/03/2018, 11:00 AM

## 2018-02-04 ENCOUNTER — Inpatient Hospital Stay (HOSPITAL_COMMUNITY): Payer: Medicare Other

## 2018-02-04 DIAGNOSIS — I361 Nonrheumatic tricuspid (valve) insufficiency: Secondary | ICD-10-CM

## 2018-02-04 LAB — CBC WITH DIFFERENTIAL/PLATELET
Basophils Absolute: 0 10*3/uL (ref 0.0–0.1)
Basophils Relative: 0 %
EOS PCT: 0 %
Eosinophils Absolute: 0 10*3/uL (ref 0.0–0.7)
HCT: 44 % (ref 36.0–46.0)
HEMOGLOBIN: 14.3 g/dL (ref 12.0–15.0)
LYMPHS ABS: 0.6 10*3/uL — AB (ref 0.7–4.0)
LYMPHS PCT: 6 %
MCH: 30.7 pg (ref 26.0–34.0)
MCHC: 32.5 g/dL (ref 30.0–36.0)
MCV: 94.4 fL (ref 78.0–100.0)
Monocytes Absolute: 0.2 10*3/uL (ref 0.1–1.0)
Monocytes Relative: 2 %
Neutro Abs: 8.8 10*3/uL — ABNORMAL HIGH (ref 1.7–7.7)
Neutrophils Relative %: 92 %
PLATELETS: 264 10*3/uL (ref 150–400)
RBC: 4.66 MIL/uL (ref 3.87–5.11)
RDW: 13.3 % (ref 11.5–15.5)
WBC: 9.5 10*3/uL (ref 4.0–10.5)

## 2018-02-04 LAB — BASIC METABOLIC PANEL
Anion gap: 7 (ref 5–15)
BUN: 23 mg/dL — AB (ref 6–20)
CHLORIDE: 100 mmol/L — AB (ref 101–111)
CO2: 33 mmol/L — AB (ref 22–32)
Calcium: 9.1 mg/dL (ref 8.9–10.3)
Creatinine, Ser: 0.58 mg/dL (ref 0.44–1.00)
GFR calc Af Amer: >60 mL/min (ref >60)
GFR calc non Af Amer: >60 mL/min (ref >60)
GLUCOSE: 274 mg/dL — AB (ref 65–99)
POTASSIUM: 5.3 mmol/L — AB (ref 3.5–5.1)
Sodium: 140 mmol/L (ref 135–145)

## 2018-02-04 LAB — GLUCOSE, CAPILLARY
GLUCOSE-CAPILLARY: 227 mg/dL — AB (ref 65–99)
Glucose-Capillary: 249 mg/dL — ABNORMAL HIGH (ref 65–99)
Glucose-Capillary: 270 mg/dL — ABNORMAL HIGH (ref 65–99)
Glucose-Capillary: 279 mg/dL — ABNORMAL HIGH (ref 65–99)

## 2018-02-04 LAB — MAGNESIUM: Magnesium: 2.2 mg/dL (ref 1.7–2.4)

## 2018-02-04 LAB — ECHOCARDIOGRAM COMPLETE
Height: 62 in
Weight: 2928 oz

## 2018-02-04 MED ORDER — INSULIN DETEMIR 100 UNIT/ML ~~LOC~~ SOLN
15.0000 [IU] | Freq: Every day | SUBCUTANEOUS | Status: DC
  Filled 2018-02-04: qty 0.15

## 2018-02-04 MED ORDER — METHYLPREDNISOLONE SODIUM SUCC 40 MG IJ SOLR
40.0000 mg | Freq: Two times a day (BID) | INTRAMUSCULAR | Status: DC
  Administered 2018-02-04 – 2018-02-06 (×4): 40 mg via INTRAVENOUS
  Filled 2018-02-04 (×4): qty 1

## 2018-02-04 MED ORDER — INSULIN ASPART 100 UNIT/ML ~~LOC~~ SOLN
5.0000 [IU] | Freq: Three times a day (TID) | SUBCUTANEOUS | Status: DC
  Administered 2018-02-04 – 2018-02-05 (×2): 5 [IU] via SUBCUTANEOUS

## 2018-02-04 MED ORDER — IPRATROPIUM-ALBUTEROL 0.5-2.5 (3) MG/3ML IN SOLN: 3.0000 mL | Freq: Three times a day (TID) | RESPIRATORY_TRACT | Status: DC

## 2018-02-04 MED ORDER — INSULIN DETEMIR 100 UNIT/ML ~~LOC~~ SOLN
16.0000 [IU] | Freq: Every day | SUBCUTANEOUS | Status: DC
  Administered 2018-02-04: 16 [IU] via SUBCUTANEOUS
  Filled 2018-02-04: qty 0.16

## 2018-02-04 MED ORDER — INSULIN ASPART 100 UNIT/ML ~~LOC~~ SOLN
4.0000 [IU] | Freq: Three times a day (TID) | SUBCUTANEOUS | Status: DC
  Administered 2018-02-04 (×2): 4 [IU] via SUBCUTANEOUS

## 2018-02-04 MED ORDER — METHYLPREDNISOLONE SODIUM SUCC 125 MG IJ SOLR: 60.0000 mg | Freq: Two times a day (BID) | INTRAMUSCULAR | Status: DC

## 2018-02-04 MED ORDER — IPRATROPIUM-ALBUTEROL 0.5-2.5 (3) MG/3ML IN SOLN
3.0000 mL | Freq: Three times a day (TID) | RESPIRATORY_TRACT | Status: DC
  Administered 2018-02-04 – 2018-02-06 (×6): 3 mL via RESPIRATORY_TRACT
  Filled 2018-02-04 (×6): qty 3

## 2018-02-04 NOTE — Progress Notes (Signed)
SATURATION QUALIFICATIONS: (This note is used to comply with regulatory documentation for home oxygen)  Patient Saturations on Room Air at Rest = 87%  Patient Saturations on Room Air while Ambulating = 79%  Patient Saturations on  Liters of oxygen while Ambulating = 95%  Please briefly explain why patient needs home oxygen:

## 2018-02-04 NOTE — Progress Notes (Signed)
Inpatient Diabetes Program Recommendations  AACE/ADA: New Consensus Statement on Inpatient Glycemic Control (2015)  Target Ranges:  Prepandial:   less than 140 mg/dL      Peak postprandial:   less than 180 mg/dL (1-2 hours)      Critically ill patients:  140 - 180 mg/dL   Lab Results  Component Value Date   GLUCAP 249 (H) 02/04/2018   HGBA1C 6.7 (H) 10/02/2017    Review of Glycemic Control  FBS and post-prandials > 180 mg/dL. Needs insulin adjustment.  Inpatient Diabetes Program Recommendations:    Increase Levemir to 16 units QHS Increase Novolog to 5 units tidwc  Need updated HgbA1C.  Continue to follow.  Thank you. Ailene Ardshonda Bettyjane Shenoy, RD, LDN, CDE Inpatient Diabetes Coordinator [phone]

## 2018-02-04 NOTE — Progress Notes (Signed)
Patient ID: Yvonne Todd, female   DOB: 1951-01-17, 67 y.o.   MRN: [id number]  PROGRESS NOTE    Yvonne Todd  YQM:[id number] DOB: 08/23/1951 DOA: 02/02/2018 PCP: Myrlene Broker, MD   Brief Narrative:  67 year old female with history of COPD, diabetes mellitus, hyperlipidemia, hypertension presented with worsening shortness of breath.  She was admitted with COPD exacerbation and started on Solu-Medrol. Assessment & Plan:   Principal Problem:   COPD exacerbation (HCC) Active Problems:   Tobacco abuse   Dyslipidemia   Steroid-induced hyperglycemia   Essential hypertension   COPD (chronic obstructive pulmonary disease) with chronic bronchitis (HCC)   Type 2 diabetes mellitus with hyperlipidemia (HCC)   COPD exacerbation -Improving but still requiring oxygen supplementation and wheezing.  Decrease Solu-Medrol to 40 mg IV every 12 hours.  Continue duo nebs and Pulmicort -Incentive spirometry -Outpatient follow-up with pulmonary -If respiratory status does not improve, might need CT scan of the chest -Respiratory panel PCR is positive for rhinovirus  Hypoxia -Probably secondary to above.  Still requiring 2 L oxygen via nasal cannula.  Wean as able.  2D echo.  Diabetes mellitus type 2 with hyperglycemia -Increase Levemir to 15 units daily and add NovoLog 4 units 3 times a day with meals.  Continue Accu-Cheks with coverage.  Oral meds on hold.  Hyperlipidemia -Continue Lipitor  Essential hypertension -Monitor blood pressure.  Continue amlodipine   DVT prophylaxis: Lovenox Code Status: Full Family Communication: None at bedside Disposition Plan: Home in 1 to 2 days  Consultants: None  Procedures: None  Antimicrobials: Zithromax   Subjective: Patient seen and examined at bedside.  She feels slightly better but is still extremely short of breath with exertion and has intermittent cough.  No overnight fever, nausea or vomiting.  No chest pains Objective: Vitals:   02/03/18 2109 02/04/18 0319 02/04/18 0549 02/04/18 0758  BP: 137/77  (!) 143/76   Pulse: 69  (!) 57   Resp: 20  18   Temp: 97.8 F (36.6 C)  97.7 F (36.5 C)   TempSrc: Oral  Oral   SpO2: 99% 97% 95% 95%  Weight:      Height:        Intake/Output Summary (Last 24 hours) at 02/04/2018 1003 Last data filed at 02/04/2018 0751 Gross per 24 hour  Intake 840 ml  Output -  Net 840 ml   Filed Weights   02/02/18 1818  Weight: 83 kg (183 lb)    Examination:  General exam: Appears calm and comfortable.  No acute distress Respiratory system: Bilateral decreased breath sound at bases with scattered crackles, but very minimal wheezing  Cardiovascular system: Rate controlled, S1-S2 heard gastrointestinal system: Abdomen is nondistended, soft and nontender. Normal bowel sounds heard. Extremities: No cyanosis, edema    Data Reviewed: I have personally reviewed following labs and imaging studies  CBC: Recent Labs  Lab 02/02/18 1042 02/03/18 0555 02/04/18 0555  WBC 6.8 5.5 9.5  NEUTROABS 4.0  --  8.8*  HGB 15.2* 14.4 14.3  HCT 46.6* 44.5 44.0  MCV 94.9 95.5 94.4  PLT 262 294 264   Basic Metabolic Panel: Recent Labs  Lab 02/02/18 1042 02/03/18 0555 02/04/18 0555  NA 143 141 140  K 4.2 4.4 5.3*  CL 100* 99* 100*  CO2 33* 33* 33*  GLUCOSE 145* 285* 274*  BUN 18 20 23*  CREATININE 0.63 0.61 0.58  CALCIUM 8.9 9.3 9.1  MG  --   --  2.2   GFR:  Estimated Creatinine Clearance: 68.2 mL/min (by C-G formula based on SCr of 0.58 mg/dL). Liver Function Tests: Recent Labs  Lab 02/02/18 1042  AST 23  ALT 15  ALKPHOS 65  BILITOT 0.6  PROT 7.4  ALBUMIN 4.0   Recent Labs  Lab 02/02/18 1042  LIPASE 22   No results for input(s): AMMONIA in the last 168 hours. Coagulation Profile: No results for input(s): INR, PROTIME in the last 168 hours. Cardiac Enzymes: No results for input(s): CKTOTAL, CKMB, CKMBINDEX, TROPONINI in the last 168 hours. BNP (last 3 results) No results  for input(s): PROBNP in the last 8760 hours. HbA1C: No results for input(s): HGBA1C in the last 72 hours. CBG: Recent Labs  Lab 02/03/18 0807 02/03/18 1147 02/03/18 1609 02/03/18 2113 02/04/18 0735  GLUCAP 241* 327* 302* 219* 249*   Lipid Profile: No results for input(s): CHOL, HDL, LDLCALC, TRIG, CHOLHDL, LDLDIRECT in the last 72 hours. Thyroid Function Tests: No results for input(s): TSH, T4TOTAL, FREET4, T3FREE, THYROIDAB in the last 72 hours. Anemia Panel: No results for input(s): VITAMINB12, FOLATE, FERRITIN, TIBC, IRON, RETICCTPCT in the last 72 hours. Sepsis Labs: No results for input(s): PROCALCITON, LATICACIDVEN in the last 168 hours.  Recent Results (from the past 240 hour(s))  Urine culture     Status: Abnormal   Collection Time: 02/02/18 10:42 AM  Result Value Ref Range Status   Specimen Description   Final    URINE, RANDOM Performed at [facility], [address] [place], [place] [address]    Special Requests   Final    NONE Performed at [facility], [address] [place], [place] [address]    Culture MULTIPLE SPECIES PRESENT, SUGGEST RECOLLECTION (A)  Final   Report Status 02/03/2018 FINAL  Final  Respiratory Panel by PCR     Status: Abnormal   Collection Time: 02/02/18  6:18 PM  Result Value Ref Range Status   Adenovirus NOT DETECTED NOT DETECTED Final   Coronavirus 229E NOT DETECTED NOT DETECTED Final   Coronavirus HKU1 NOT DETECTED NOT DETECTED Final   Coronavirus NL63 NOT DETECTED NOT DETECTED Final   Coronavirus OC43 NOT DETECTED NOT DETECTED Final   Metapneumovirus NOT DETECTED NOT DETECTED Final   Rhinovirus / Enterovirus DETECTED (A) NOT DETECTED Final   Influenza A NOT DETECTED NOT DETECTED Final   Influenza B NOT DETECTED NOT DETECTED Final   Parainfluenza Virus 1 NOT DETECTED NOT DETECTED Final   Parainfluenza Virus 2 NOT DETECTED NOT DETECTED Final   Parainfluenza Virus 3 NOT DETECTED NOT DETECTED  Final   Parainfluenza Virus 4 NOT DETECTED NOT DETECTED Final   Respiratory Syncytial Virus NOT DETECTED NOT DETECTED Final   Bordetella pertussis NOT DETECTED NOT DETECTED Final   Chlamydophila pneumoniae NOT DETECTED NOT DETECTED Final   Mycoplasma pneumoniae NOT DETECTED NOT DETECTED Final    Comment: Performed at [facility], [address] [place], [place] [address]         Radiology Studies: Dg Chest 2 View  Result Date: 02/02/2018 CLINICAL DATA:  Hypoxia, shortness of breath, productive cough EXAM: CHEST - 2 VIEW COMPARISON:  10/15/2017 FINDINGS: Heart and mediastinal contours are within normal limits. No focal opacities or effusions. No acute bony abnormality. IMPRESSION: No active cardiopulmonary disease. Electronically Signed   By: Charlett NoseKevin  Dover M.D.   On: 02/02/2018 11:32        Scheduled Meds: . amLODipine  10 mg Oral Daily  . aspirin EC  81  mg Oral Daily  . atorvastatin  40 mg Oral QHS  . budesonide (PULMICORT) nebulizer solution  0.5 mg Nebulization BID  . calcium carbonate  1 tablet Oral BID WC  . chlorhexidine  15 mL Mouth Rinse BID  . diphenhydrAMINE  50 mg Oral QHS  . enoxaparin (LOVENOX) injection  40 mg Subcutaneous Q24H  . fluticasone  2 spray Each Nare Daily  . guaiFENesin  600 mg Oral BID  . insulin aspart  0-20 Units Subcutaneous TID WC  . insulin aspart  0-5 Units Subcutaneous QHS  . insulin aspart  4 Units Subcutaneous TID WC  . insulin detemir  15 Units Subcutaneous QHS  . ipratropium-albuterol  3 mL Nebulization TID  . mouth rinse  15 mL Mouth Rinse q12n4p  . methylPREDNISolone (SOLU-MEDROL) injection  60 mg Intravenous Q12H   Continuous Infusions:   LOS: 2 days        Glade Lloyd, MD [facility] Pager [phone]  If 7PM-7AM, please contact night-coverage [email or web address] Password [email or web address] 02/04/2018, 10:03 AM

## 2018-02-04 NOTE — Progress Notes (Signed)
  Echocardiogram 2D Echocardiogram has been performed.  Belva ChimesWendy  Zalma Channing 02/04/2018, 1:59 PM

## 2018-02-05 LAB — HEMOGLOBIN A1C
Hgb A1c MFr Bld: 7 % — ABNORMAL HIGH (ref 4.8–5.6)
Mean Plasma Glucose: 154.2 mg/dL

## 2018-02-05 LAB — BASIC METABOLIC PANEL
Anion gap: 8 (ref 5–15)
BUN: 19 mg/dL (ref 6–20)
CALCIUM: 8.8 mg/dL — AB (ref 8.9–10.3)
CHLORIDE: 97 mmol/L — AB (ref 101–111)
CO2: 37 mmol/L — AB (ref 22–32)
CREATININE: 0.59 mg/dL (ref 0.44–1.00)
GFR calc non Af Amer: >60 mL/min (ref >60)
Glucose, Bld: 327 mg/dL — ABNORMAL HIGH (ref 65–99)
Potassium: 4.8 mmol/L (ref 3.5–5.1)
SODIUM: 142 mmol/L (ref 135–145)

## 2018-02-05 LAB — CBC WITH DIFFERENTIAL/PLATELET
BASOS PCT: 0 %
Basophils Absolute: 0 10*3/uL (ref 0.0–0.1)
EOS ABS: 0 10*3/uL (ref 0.0–0.7)
Eosinophils Relative: 0 %
HCT: 44.2 % (ref 36.0–46.0)
HEMOGLOBIN: 14.4 g/dL (ref 12.0–15.0)
Lymphocytes Relative: 5 %
Lymphs Abs: 0.5 10*3/uL — ABNORMAL LOW (ref 0.7–4.0)
MCH: 31.4 pg (ref 26.0–34.0)
MCHC: 32.6 g/dL (ref 30.0–36.0)
MCV: 96.3 fL (ref 78.0–100.0)
MONO ABS: 0.3 10*3/uL (ref 0.1–1.0)
MONOS PCT: 4 %
NEUTROS PCT: 91 %
Neutro Abs: 7.8 10*3/uL — ABNORMAL HIGH (ref 1.7–7.7)
Platelets: 284 10*3/uL (ref 150–400)
RBC: 4.59 MIL/uL (ref 3.87–5.11)
RDW: 13.3 % (ref 11.5–15.5)
WBC: 8.6 10*3/uL (ref 4.0–10.5)

## 2018-02-05 LAB — GLUCOSE, CAPILLARY
GLUCOSE-CAPILLARY: 226 mg/dL — AB (ref 65–99)
Glucose-Capillary: 261 mg/dL — ABNORMAL HIGH (ref 65–99)
Glucose-Capillary: 216 mg/dL — ABNORMAL HIGH (ref 65–99)

## 2018-02-05 LAB — MAGNESIUM: MAGNESIUM: 2.5 mg/dL — AB (ref 1.7–2.4)

## 2018-02-05 MED ORDER — INSULIN DETEMIR 100 UNIT/ML ~~LOC~~ SOLN
20.0000 [IU] | Freq: Every day | SUBCUTANEOUS | Status: DC
  Administered 2018-02-05: 20 [IU] via SUBCUTANEOUS
  Filled 2018-02-05 (×2): qty 0.2

## 2018-02-05 MED ORDER — INSULIN ASPART 100 UNIT/ML ~~LOC~~ SOLN
7.0000 [IU] | Freq: Three times a day (TID) | SUBCUTANEOUS | Status: DC
  Administered 2018-02-05 – 2018-02-06 (×4): 7 [IU] via SUBCUTANEOUS

## 2018-02-05 NOTE — Progress Notes (Signed)
Patient ID: Yvonne Todd, female   DOB: 1951/07/22, 67 y.o.   MRN: [id number]  PROGRESS NOTE    HANH KERTESZ  WUJ:[id number] DOB: [birth year] DOA: 02/02/2018 PCP: Myrlene Broker, MD   Brief Narrative:  67 year old female with history of COPD, diabetes mellitus, hyperlipidemia, hypertension presented with worsening shortness of breath.  She was admitted with COPD exacerbation and started on Solu-Medrol. Assessment & Plan:   Principal Problem:   COPD exacerbation (HCC) Active Problems:   Tobacco abuse   Dyslipidemia   Steroid-induced hyperglycemia   Essential hypertension   COPD (chronic obstructive pulmonary disease) with chronic bronchitis (HCC)   Type 2 diabetes mellitus with hyperlipidemia (HCC)   COPD exacerbation -Improving but still requiring oxygen supplementation at 2 L/min.  Continue current dose of Solu-Medrol along with DuoNebs and Pulmicort.  Probable switch to oral prednisone tomorrow -Incentive spirometry -Outpatient follow-up with pulmonary -Respiratory panel PCR is positive for rhinovirus  Hypoxia -Probably secondary to above.  Still requiring 2 L oxygen via nasal cannula.  Might need home oxygen on discharge.  Echo showed EF of 60 to 65%  Diabetes mellitus type 2 with hyperglycemia -Still has hyperglycemia.  Increase Levemir to 20 units daily and NovoLog to 7 units 3 times a day with meals.  Continue Accu-Cheks with coverage .  Oral meds on hold.  Hyperlipidemia -Continue Lipitor  Essential hypertension -Monitor blood pressure.  Continue amlodipine   DVT prophylaxis: Lovenox Code Status: Full Family Communication: None at bedside Disposition Plan: Home in 1 to 2 days  Consultants: None  Procedures:  Echo on 02/04/2018 Study Conclusions  - Left ventricle: The cavity size was normal. Systolic function was   normal. The estimated ejection fraction was in the range of 60%   to 65%. Wall motion was normal; there were no regional wall   motion  abnormalities. Doppler parameters are consistent with both   elevated ventricular end-diastolic filling pressure and elevated   left atrial filling pressure. - Aortic valve: There was very mild stenosis. Valve area (VTI):   1.96 cm^2. Valve area (Vmax): 1.96 cm^2. Valve area (Vmean): 2.03   cm^2. - Left atrium: The atrium was mildly dilated.  Antimicrobials: Zithromax 02/02/2018-02/04/2018   Subjective: Patient seen and examined at bedside.  No overnight fever, nausea, vomiting, worsening shortness of breath or chest pain.  She feels better but does not feel that she is ready to go home today.    Objective: Vitals:   02/04/18 1503 02/04/18 1954 02/04/18 2040 02/05/18 0518  BP:  139/70  (!) 136/51  Pulse:  65  65  Resp:  16  16  Temp:  98 F (36.7 C)  98.4 F (36.9 C)  TempSrc:  Oral  Oral  SpO2: 95% 95% 96% 96%  Weight:      Height:        Intake/Output Summary (Last 24 hours) at 02/05/2018 1013 Last data filed at 02/05/2018 0700 Gross per 24 hour  Intake 960 ml  Output -  Net 960 ml   Filed Weights   02/02/18 1818  Weight: 83 kg (183 lb)    Examination:  General exam: No acute distress. Respiratory system: Bilateral decreased breath sounds at bases with scattered crackles, not much wheezing  cardiovascular system: S1 S2 heard, rate controlled gastrointestinal system: Abdomen is nondistended, soft and nontender. Normal bowel sounds heard. Extremities: No cyanosis, edema    Data Reviewed: I have personally reviewed following labs and imaging studies  CBC: Recent Labs  Lab  02/02/18 1042 02/03/18 0555 02/04/18 0555 02/05/18 0556  WBC 6.8 5.5 9.5 8.6  NEUTROABS 4.0  --  8.8* 7.8*  HGB 15.2* 14.4 14.3 14.4  HCT 46.6* 44.5 44.0 44.2  MCV 94.9 95.5 94.4 96.3  PLT 262 294 264 284   Basic Metabolic Panel: Recent Labs  Lab 02/02/18 1042 02/03/18 0555 02/04/18 0555 02/05/18 0556  NA 143 141 140 142  K 4.2 4.4 5.3* 4.8  CL 100* 99* 100* 97*  CO2 33* 33* 33* 37*   GLUCOSE 145* 285* 274* 327*  BUN 18 20 23* 19  CREATININE 0.63 0.61 0.58 0.59  CALCIUM 8.9 9.3 9.1 8.8*  MG  --   --  2.2 2.5*   GFR: Estimated Creatinine Clearance: 68.2 mL/min (by C-G formula based on SCr of 0.59 mg/dL). Liver Function Tests: Recent Labs  Lab 02/02/18 1042  AST 23  ALT 15  ALKPHOS 65  BILITOT 0.6  PROT 7.4  ALBUMIN 4.0   Recent Labs  Lab 02/02/18 1042  LIPASE 22   No results for input(s): AMMONIA in the last 168 hours. Coagulation Profile: No results for input(s): INR, PROTIME in the last 168 hours. Cardiac Enzymes: No results for input(s): CKTOTAL, CKMB, CKMBINDEX, TROPONINI in the last 168 hours. BNP (last 3 results) No results for input(s): PROBNP in the last 8760 hours. HbA1C: Recent Labs    02/05/18 0556  HGBA1C 7.0*   CBG: Recent Labs  Lab 02/04/18 0735 02/04/18 1100 02/04/18 1645 02/04/18 2001 02/05/18 0734  GLUCAP 249* 227* 270* 279* 261*   Lipid Profile: No results for input(s): CHOL, HDL, LDLCALC, TRIG, CHOLHDL, LDLDIRECT in the last 72 hours. Thyroid Function Tests: No results for input(s): TSH, T4TOTAL, FREET4, T3FREE, THYROIDAB in the last 72 hours. Anemia Panel: No results for input(s): VITAMINB12, FOLATE, FERRITIN, TIBC, IRON, RETICCTPCT in the last 72 hours. Sepsis Labs: No results for input(s): PROCALCITON, LATICACIDVEN in the last 168 hours.  Recent Results (from the past 240 hour(s))  Urine culture     Status: Abnormal   Collection Time: 02/02/18 10:42 AM  Result Value Ref Range Status   Specimen Description   Final    URINE, RANDOM Performed at [facility], [address], [place], [place] [address]    Special Requests   Final    NONE Performed at [facility], [address] [place], [place] [address]    Culture MULTIPLE SPECIES PRESENT, SUGGEST RECOLLECTION (A)  Final   Report Status 02/03/2018 FINAL  Final  Respiratory Panel by PCR     Status: Abnormal    Collection Time: 02/02/18  6:18 PM  Result Value Ref Range Status   Adenovirus NOT DETECTED NOT DETECTED Final   Coronavirus 229E NOT DETECTED NOT DETECTED Final   Coronavirus HKU1 NOT DETECTED NOT DETECTED Final   Coronavirus NL63 NOT DETECTED NOT DETECTED Final   Coronavirus OC43 NOT DETECTED NOT DETECTED Final   Metapneumovirus NOT DETECTED NOT DETECTED Final   Rhinovirus / Enterovirus DETECTED (A) NOT DETECTED Final   Influenza A NOT DETECTED NOT DETECTED Final   Influenza B NOT DETECTED NOT DETECTED Final   Parainfluenza Virus 1 NOT DETECTED NOT DETECTED Final   Parainfluenza Virus 2 NOT DETECTED NOT DETECTED Final   Parainfluenza Virus 3 NOT DETECTED NOT DETECTED Final   Parainfluenza Virus 4 NOT DETECTED NOT DETECTED Final   Respiratory Syncytial Virus NOT DETECTED NOT DETECTED Final   Bordetella pertussis NOT DETECTED NOT DETECTED Final   Chlamydophila  pneumoniae NOT DETECTED NOT DETECTED Final   Mycoplasma pneumoniae NOT DETECTED NOT DETECTED Final    Comment: Performed at [facility], [address] [place], [place] [address]         Radiology Studies: No results found.      Scheduled Meds: . amLODipine  10 mg Oral Daily  . aspirin EC  81 mg Oral Daily  . atorvastatin  40 mg Oral QHS  . budesonide (PULMICORT) nebulizer solution  0.5 mg Nebulization BID  . calcium carbonate  1 tablet Oral BID WC  . chlorhexidine  15 mL Mouth Rinse BID  . diphenhydrAMINE  50 mg Oral QHS  . enoxaparin (LOVENOX) injection  40 mg Subcutaneous Q24H  . fluticasone  2 spray Each Nare Daily  . guaiFENesin  600 mg Oral BID  . insulin aspart  0-20 Units Subcutaneous TID WC  . insulin aspart  0-5 Units Subcutaneous QHS  . insulin aspart  5 Units Subcutaneous TID WC  . insulin detemir  16 Units Subcutaneous QHS  . ipratropium-albuterol  3 mL Nebulization TID  . mouth rinse  15 mL Mouth Rinse q12n4p  . methylPREDNISolone (SOLU-MEDROL) injection  40 mg Intravenous Q12H    Continuous Infusions:   LOS: 3 days        Glade LloydKshitiz Aric Jost, MD [facility] Pager [phone]  If 7PM-7AM, please contact night-coverage [email or web address] Password [email or web address] 02/05/2018, 10:13 AM

## 2018-02-05 NOTE — Evaluation (Signed)
Physical Therapy Evaluation Patient Details Name: Yvonne Todd MRN: [id number] DOB: May 13, 1951 Today's Date: 02/05/2018   History of Present Illness  67 year old female with history of COPD, diabetes mellitus, hyperlipidemia, hypertension presented with worsening shortness of breath.  She was admitted with COPD exacerbation and started on Solu-Medrol.  Clinical Impression  Patient presents with decreased mobility due to general weakness and newly hypoxic with ambulation on RA.  She will benefit from skilled PT in the acute setting but likely not need follow up PT at d/c.  Will place note for supplementaly O2 qualifications.     Follow Up Recommendations No PT follow up    Equipment Recommendations  None recommended by PT    Recommendations for Other Services       Precautions / Restrictions Precautions Precautions: Fall Precaution Comments: watch O2 Restrictions Weight Bearing Restrictions: No      Mobility  Bed Mobility Overal bed mobility: Modified Independent                Transfers Overall transfer level: Modified independent Equipment used: None             General transfer comment: able to stand unaided  Ambulation/Gait Ambulation/Gait assistance: Independent Ambulation Distance (Feet): 180 Feet Assistive device: None Gait Pattern/deviations: Step-through pattern;Decreased stride length     General Gait Details: managing without AD, but on RA SpO2 down to 81%, back upto 92% on 2L O2 2 minutes  Stairs            Wheelchair Mobility    Modified Rankin (Stroke Patients Only)       Balance Overall balance assessment: Needs assistance   Sitting balance-Leahy Scale: Good       Standing balance-Leahy Scale: Good                               Pertinent Vitals/Pain Pain Assessment: No/denies pain    Home Living Family/patient expects to be discharged to:: Private residence Living Arrangements: Alone Available Help at  Discharge: Available PRN/intermittently;Family Type of Home: House Home Access: Stairs to enter Entrance Stairs-Rails: Right Entrance Stairs-Number of Steps: 3 Home Layout: One level Home Equipment: Walker - 2 wheels;Wheelchair - manual Additional Comments: equipment is her mother's    Prior Function Level of Independence: Independent         Comments: sponge bathes     Hand Dominance   Dominant Hand: Right    Extremity/Trunk Assessment   Upper Extremity Assessment Upper Extremity Assessment: Defer to OT evaluation    Lower Extremity Assessment Lower Extremity Assessment: Overall WFL for tasks assessed       Communication   Communication: No difficulties  Cognition Arousal/Alertness: Awake/alert Behavior During Therapy: WFL for tasks assessed/performed Overall Cognitive Status: Within Functional Limits for tasks assessed                                        General Comments General comments (skin integrity, edema, etc.): Educated about fall risk and risk for injury with steriod use.  Tips for fall prevention and energy conservation discussed as pt in process of moving her mom out to nursing home and has a lot of mess to clean at her house    Exercises     Assessment/Plan    PT Assessment Patent does not need any further PT services  PT Problem List         PT Treatment Interventions      PT Goals (Current goals can be found in the Care Plan section)  Acute Rehab PT Goals PT Goal Formulation: All assessment and education complete, DC therapy    Frequency     Barriers to discharge        Co-evaluation               AM-PAC PT "6 Clicks" Daily Activity  Outcome Measure Difficulty turning over in bed (including adjusting bedclothes, sheets and blankets)?: None Difficulty moving from lying on back to sitting on the side of the bed? : A Little Difficulty sitting down on and standing up from a chair with arms (e.g., wheelchair,  bedside commode, etc,.)?: None Help needed moving to and from a bed to chair (including a wheelchair)?: None Help needed walking in hospital room?: A Little Help needed climbing 3-5 steps with a railing? : A Little 6 Click Score: 21    End of Session Equipment Utilized During Treatment: Gait belt;Oxygen Activity Tolerance: Patient tolerated treatment well Patient left: with call bell/phone within reach;in bed(sitting EOB)   PT Visit Diagnosis: Other abnormalities of gait and mobility (R26.89)    Time: [id number] PT Time Calculation (min) (ACUTE ONLY): 18 min   Charges:   PT Evaluation $PT Eval Low Complexity: 1 Low     PT G CodesSheran Lawless:        Cyndi Wynn, [place] [phone] 02/05/2018   Elray Mcgregorynthia Wynn 02/05/2018, 4:43 PM

## 2018-02-05 NOTE — Progress Notes (Signed)
Decreased O2 to 1.5L

## 2018-02-05 NOTE — Progress Notes (Signed)
SATURATION QUALIFICATIONS: (This note is used to comply with regulatory documentation for home oxygen)  Patient Saturations on Room Air at Rest = 93%  Patient Saturations on Room Air while Ambulating = 81%  Patient Saturations on 2 Liters of oxygen while Ambulating = 92%  Please briefly explain why patient needs home oxygen:  Patient SOB and hypoxic on RA with ambulation.    Beaver Crossingyndi Cartrell B[place], [place] [phone] 02/05/2018

## 2018-02-06 LAB — GLUCOSE, CAPILLARY
Glucose-Capillary: 233 mg/dL — ABNORMAL HIGH (ref 65–99)
Glucose-Capillary: 247 mg/dL — ABNORMAL HIGH (ref 65–99)

## 2018-02-06 MED ORDER — INSULIN DETEMIR 100 UNIT/ML ~~LOC~~ SOLN: 20.0000 [IU] | Freq: Every day | SUBCUTANEOUS | 0 refills | Status: DC

## 2018-02-06 MED ORDER — ALBUTEROL SULFATE HFA 108 (90 BASE) MCG/ACT IN AERS: 2.0000 | INHALATION_SPRAY | RESPIRATORY_TRACT | 0 refills | Status: DC | PRN

## 2018-02-06 MED ORDER — IPRATROPIUM-ALBUTEROL 0.5-2.5 (3) MG/3ML IN SOLN: 3.0000 mL | RESPIRATORY_TRACT | Status: DC | PRN

## 2018-02-06 MED ORDER — INSULIN PEN NEEDLE 31G X 5 MM MISC: 0 refills | Status: DC

## 2018-02-06 MED ORDER — FLUTICASONE PROPIONATE 50 MCG/ACT NA SUSP: 2.0000 | Freq: Two times a day (BID) | NASAL | Status: DC | PRN

## 2018-02-06 MED ORDER — GUAIFENESIN ER 600 MG PO TB12: 600.0000 mg | ORAL_TABLET | Freq: Two times a day (BID) | ORAL | 0 refills | Status: DC

## 2018-02-06 MED ORDER — PREDNISONE 20 MG PO TABS: 40.0000 mg | ORAL_TABLET | Freq: Every day | ORAL | 0 refills | Status: DC

## 2018-02-06 MED ORDER — INSULIN LISPRO 100 UNIT/ML (KWIKPEN): PEN_INJECTOR | SUBCUTANEOUS | 0 refills | Status: DC

## 2018-02-06 NOTE — Care Management Note (Signed)
Case Management Note  Patient Details  Name: Yvonne Todd MRN: [id number] Date of Birth: 02-15-1951  Subjective/Objective:  COPD exacerbation                  Action/Plan: Spoke to pt and she lives with elderly mother. She has neb machine, cane and RW at home. Contacted [facility] for oxygen for home. [facility] portable to room and oxygen concentrator to home.   Expected Discharge Date:  02/06/18               Expected Discharge Plan:  Home w Home Health Services  In-House Referral:  NA  Discharge planning Services  CM Consult  Post Acute Care Choice:  Durable Medical Equipment Choice offered to:  NA  DME Arranged:  Oxygen DME Agency:  [facility]  HH Arranged:  NA HH Agency:  NA  Status of Service:  Completed, signed off  If discussed at Long Length of Stay Meetings, dates discussed:    Additional Comments:  Elliot CousinShavis, Eddith Mentor Ellen, RN 02/06/2018, 11:14 AM

## 2018-02-06 NOTE — Discharge Summary (Signed)
Physician Discharge Summary  Yvonne Todd WUJ:[id number] DOB: Dec 13, 1950 DOA: 02/02/2018  PCP: [name], Elizabeth A, MD  Admit date: 02/02/2018 Discharge date: 02/06/2018  Admitted From: Home Disposition:  Home  Recommendations for Outpatient Follow-up:  1. Follow up with PCP in 1 week 2. Outpatient follow-up with pulmonary   Home Health: No Equipment/Devices: Home oxygen via nasal cannula at 2 L/min as oxygen saturated dropped to the 80s on ambulation on room air.  Discharge Condition: Stable CODE STATUS: Full Diet recommendation: Heart Healthy / Carb Modified Brief/Interim Summary: 67 year old female with history of COPD, diabetes mellitus, hyperlipidemia, hypertension presented with worsening shortness of breath.  She was admitted with COPD exacerbation and started on Solu-Medrol.  Her respiratory status has gradually improved but she is still requiring oxygen via nasal cannula at 2 L/min and will be discharged home on the same.  She will need outpatient evaluation and follow-up by pulmonary.    Discharge Diagnoses:  Principal Problem:   COPD exacerbation (HCC) Active Problems:   Tobacco abuse   Dyslipidemia   Steroid-induced hyperglycemia   Essential hypertension   COPD (chronic obstructive pulmonary disease) with chronic bronchitis (HCC)   Type 2 diabetes mellitus with hyperlipidemia (HCC)  COPD exacerbation -Improving but still requiring oxygen supplementation at 2 L/min.   Will discharge home on the same.  Treated with intravenous Solu-Medrol.  Switch to oral prednisone 40 mill grams daily for 7 days.  Continue budesonide and albuterol at home. -Outpatient follow-up with pulmonary -Respiratory panel PCR is positive for rhinovirus  Hypoxia -Probably secondary to above.  Still requiring 2 L oxygen via nasal cannula.   Echo showed EF of 60 to 65%  Diabetes mellitus type 2 with hyperglycemia -We will keep oral medications on hold till evaluation by primary care provider.   Continue Levemir and sliding scale insulin at home.  Hyperlipidemia -Continue Lipitor  Essential hypertension -Monitor blood pressure.  Continue amlodipine     Discharge Instructions  Discharge Instructions    Ambulatory referral to Pulmonology   Complete by:  As directed    Copd exacerbation   Call MD for:  difficulty breathing, headache or visual disturbances   Complete by:  As directed    Call MD for:  extreme fatigue   Complete by:  As directed    Call MD for:  hives   Complete by:  As directed    Call MD for:  persistant dizziness or light-headedness   Complete by:  As directed    Call MD for:  persistant nausea and vomiting   Complete by:  As directed    Call MD for:  severe uncontrolled pain   Complete by:  As directed    Call MD for:  temperature >100.4   Complete by:  As directed    Diet - low sodium heart healthy   Complete by:  As directed    Diet Carb Modified   Complete by:  As directed    Increase activity slowly   Complete by:  As directed      Allergies as of 02/06/2018      Reactions   Fish Allergy Rash, Other (See Comments)   Some types of fish with iodine causes rash   Iodine Rash   Penicillins Rash, Other (See Comments)   Has patient had a PCN reaction causing immediate rash, facial/tongue/throat swelling, SOB or lightheadedness with hypotension: yes Has patient had a PCN reaction causing severe rash involving mucus membranes or skin necrosis: no Has patient had  a PCN reaction that required hospitalization: no Has patient had a PCN reaction occurring within the last 10 years: no If all of the above answers are "NO", then may proceed with Cephalosporin use.   Tetracyclines & Related Rash      Medication List    STOP taking these medications   glipiZIDE 5 MG tablet Commonly known as:  GLUCOTROL   insulin glargine 100 UNIT/ML injection Commonly known as:  LANTUS Replaced by:  insulin detemir 100 UNIT/ML injection     TAKE these  medications   albuterol (2.5 MG/3ML) 0.083% nebulizer solution Commonly known as:  PROVENTIL Take 3 mLs (2.5 mg total) by nebulization 3 (three) times daily. What changed:  Another medication with the same name was changed. Make sure you understand how and when to take each.   albuterol 108 (90 Base) MCG/ACT inhaler Commonly known as:  PROVENTIL HFA;VENTOLIN HFA Inhale 2 puffs into the lungs every 4 (four) hours as needed for wheezing or shortness of breath. What changed:  when to take this   amLODipine 10 MG tablet Commonly known as:  NORVASC Take 1 tablet (10 mg total) by mouth daily.   aspirin EC 81 MG tablet Take 1 tablet (81 mg total) by mouth daily.   atorvastatin 40 MG tablet Commonly known as:  LIPITOR Take 1 tablet (40 mg total) by mouth at bedtime.   budesonide 0.5 MG/2ML nebulizer solution Commonly known as:  PULMICORT Take 2 mLs (0.5 mg total) by nebulization 2 (two) times daily.   CALCIUM 600 600 MG Tabs tablet Generic drug:  calcium carbonate Take 600 mg by mouth 2 (two) times daily with a meal.   diphenhydrAMINE 25 MG tablet Commonly known as:  BENADRYL Take 50 mg by mouth at bedtime.   diphenhydramine-acetaminophen 25-500 MG Tabs tablet Commonly known as:  TYLENOL PM Take 2 tablets by mouth at bedtime as needed (For pain.).   fluticasone 50 MCG/ACT nasal spray Commonly known as:  FLONASE Place 2 sprays into both nostrils 2 (two) times daily as needed for allergies.   guaiFENesin 600 MG 12 hr tablet Commonly known as:  MUCINEX Take 1 tablet (600 mg total) by mouth 2 (two) times daily. What changed:    when to take this  reasons to take this   insulin detemir 100 UNIT/ML injection Commonly known as:  LEVEMIR Inject 0.2 mLs (20 Units total) into the skin at bedtime. Replaces:  insulin glargine 100 UNIT/ML injection   insulin lispro 100 UNIT/ML KiwkPen Commonly known as:  HUMALOG KWIKPEN Use TID AC and HS SSI: 150-199 2units, 200-249 4units,  250-299 6units, 300-249 8units, >350 10units and call office   Insulin Pen Needle 31G X 5 MM Misc levemir 20 units at bedtime and novolog as per sliding scale   ipratropium-albuterol 0.5-2.5 (3) MG/3ML Soln Commonly known as:  DUONEB Take 3 mLs by nebulization every 4 (four) hours as needed (shortness of breath). What changed:    when to take this  reasons to take this   Melatonin 5 MG Tabs Take 5 mg by mouth at bedtime as needed (sleep).   multivitamin tablet Take 1 tablet by mouth daily.   nitroGLYCERIN 0.4 MG SL tablet Commonly known as:  NITROSTAT Place 1 tablet (0.4 mg total) under the tongue every 5 (five) minutes as needed for chest pain.   nystatin-triamcinolone ointment Commonly known as:  MYCOLOG Apply 1 application topically 2 (two) times daily.   polyvinyl alcohol 1.4 % ophthalmic solution Commonly known as:  LIQUIFILM TEARS Place 2 drops into both eyes as needed for dry eyes.   predniSONE 20 MG tablet Commonly known as:  DELTASONE Take 2 tablets (40 mg total) by mouth daily with breakfast.   umeclidinium-vilanterol 62.5-25 MCG/INH Aepb Commonly known as:  ANORO ELLIPTA Inhale 1 puff into the lungs daily.            Durable Medical Equipment  (From admission, onward)        Start     Ordered   02/06/18 1007  DME Oxygen  Once    Question Answer Comment  Mode or (Route) Nasal cannula   Liters per Minute 2   Frequency Continuous (stationary and portable oxygen unit needed)   Oxygen conserving device Yes   Oxygen delivery system Gas      02/06/18 1009     Follow-up Information    Myrlene Broker, MD. Schedule an appointment as soon as possible for a visit in 1 week(s).   Specialty:  Internal Medicine Contact information: [address] [place] [address] [phone]          Allergies  Allergen Reactions  . Fish Allergy Rash and Other (See Comments)    Some types of fish with iodine causes rash  . Iodine Rash  .  Penicillins Rash and Other (See Comments)    Has patient had a PCN reaction causing immediate rash, facial/tongue/throat swelling, SOB or lightheadedness with hypotension: yes Has patient had a PCN reaction causing severe rash involving mucus membranes or skin necrosis: no Has patient had a PCN reaction that required hospitalization: no Has patient had a PCN reaction occurring within the last 10 years: no If all of the above answers are "NO", then may proceed with Cephalosporin use.   . Tetracyclines & Related Rash    Consultations:  None   Procedures/Studies: Dg Chest 2 View  Result Date: 02/02/2018 CLINICAL DATA:  Hypoxia, shortness of breath, productive cough EXAM: CHEST - 2 VIEW COMPARISON:  10/15/2017 FINDINGS: Heart and mediastinal contours are within normal limits. No focal opacities or effusions. No acute bony abnormality. IMPRESSION: No active cardiopulmonary disease. Electronically Signed   By: Charlett Nose M.D.   On: 02/02/2018 11:32     Echo on 02/04/2018 Study Conclusions  - Left ventricle: The cavity size was normal. Systolic function was normal. The estimated ejection fraction was in the range of 60% to 65%. Wall motion was normal; there were no regional wall motion abnormalities. Doppler parameters are consistent with both elevated ventricular end-diastolic filling pressure and elevated left atrial filling pressure. - Aortic valve: There was very mild stenosis. Valve area (VTI): 1.96 cm^2. Valve area (Vmax): 1.96 cm^2. Valve area (Vmean): 2.03 cm^2. - Left atrium: The atrium was mildly dilated.     Subjective: Patient seen and examined at bedside.  She feels much better and wants to go home.  Still has intermittent cough.  No overnight fever, nausea or vomiting.  Discharge Exam: Vitals:   02/06/18 0917 02/06/18 0921  BP:    Pulse:    Resp:    Temp:    SpO2: 96% 96%   Vitals:   02/05/18 2008 02/06/18 0642 02/06/18 0917 02/06/18 0921  BP:   137/66    Pulse:  67    Resp:  20    Temp:      TempSrc:      SpO2: 97% 98% 96% 96%  Weight:      Height:  General: Pt is alert, awake, not in acute distress Cardiovascular: Rate controlled, S1/S2 + Respiratory: Bilateral decreased breath sounds at base,  very minimal wheezing abdominal: Soft, NT, ND, bowel sounds + Extremities: Trace edema, no cyanosis    The results of significant diagnostics from this hospitalization (including imaging, microbiology, ancillary and laboratory) are listed below for reference.     Microbiology: Recent Results (from the past 240 hour(s))  Urine culture     Status: Abnormal   Collection Time: 02/02/18 10:42 AM  Result Value Ref Range Status   Specimen Description   Final    URINE, RANDOM Performed at [facility], [address], [place], [place] [address]    Special Requests   Final    NONE Performed at [facility], [address] [place], [place] [address]    Culture MULTIPLE SPECIES PRESENT, SUGGEST RECOLLECTION (A)  Final   Report Status 02/03/2018 FINAL  Final  Respiratory Panel by PCR     Status: Abnormal   Collection Time: 02/02/18  6:18 PM  Result Value Ref Range Status   Adenovirus NOT DETECTED NOT DETECTED Final   Coronavirus 229E NOT DETECTED NOT DETECTED Final   Coronavirus HKU1 NOT DETECTED NOT DETECTED Final   Coronavirus NL63 NOT DETECTED NOT DETECTED Final   Coronavirus OC43 NOT DETECTED NOT DETECTED Final   Metapneumovirus NOT DETECTED NOT DETECTED Final   Rhinovirus / Enterovirus DETECTED (A) NOT DETECTED Final   Influenza A NOT DETECTED NOT DETECTED Final   Influenza B NOT DETECTED NOT DETECTED Final   Parainfluenza Virus 1 NOT DETECTED NOT DETECTED Final   Parainfluenza Virus 2 NOT DETECTED NOT DETECTED Final   Parainfluenza Virus 3 NOT DETECTED NOT DETECTED Final   Parainfluenza Virus 4 NOT DETECTED NOT DETECTED Final   Respiratory Syncytial Virus NOT DETECTED NOT  DETECTED Final   Bordetella pertussis NOT DETECTED NOT DETECTED Final   Chlamydophila pneumoniae NOT DETECTED NOT DETECTED Final   Mycoplasma pneumoniae NOT DETECTED NOT DETECTED Final    Comment: Performed at [facility], [address] [place], [place] [address]     Labs: BNP (last 3 results) Recent Labs    02/02/18 1042  BNP 22.7   Basic Metabolic Panel: Recent Labs  Lab 02/02/18 1042 02/03/18 0555 02/04/18 0555 02/05/18 0556  NA 143 141 140 142  K 4.2 4.4 5.3* 4.8  CL 100* 99* 100* 97*  CO2 33* 33* 33* 37*  GLUCOSE 145* 285* 274* 327*  BUN 18 20 23* 19  CREATININE 0.63 0.61 0.58 0.59  CALCIUM 8.9 9.3 9.1 8.8*  MG  --   --  2.2 2.5*   Liver Function Tests: Recent Labs  Lab 02/02/18 1042  AST 23  ALT 15  ALKPHOS 65  BILITOT 0.6  PROT 7.4  ALBUMIN 4.0   Recent Labs  Lab 02/02/18 1042  LIPASE 22   No results for input(s): AMMONIA in the last 168 hours. CBC: Recent Labs  Lab 02/02/18 1042 02/03/18 0555 02/04/18 0555 02/05/18 0556  WBC 6.8 5.5 9.5 8.6  NEUTROABS 4.0  --  8.8* 7.8*  HGB 15.2* 14.4 14.3 14.4  HCT 46.6* 44.5 44.0 44.2  MCV 94.9 95.5 94.4 96.3  PLT 262 294 264 284   Cardiac Enzymes: No results for input(s): CKTOTAL, CKMB, CKMBINDEX, TROPONINI in the last 168 hours. BNP: Invalid input(s): POCBNP CBG: Recent Labs  Lab 02/04/18 2001 02/05/18 0734 02/05/18 1151 02/05/18 2132 02/06/18 0734  GLUCAP 279* 261*  226* 216* 233*   D-Dimer No results for input(s): DDIMER in the last 72 hours. Hgb A1c Recent Labs    02/05/18 0556  HGBA1C 7.0*   Lipid Profile No results for input(s): CHOL, HDL, LDLCALC, TRIG, CHOLHDL, LDLDIRECT in the last 72 hours. Thyroid function studies No results for input(s): TSH, T4TOTAL, T3FREE, THYROIDAB in the last 72 hours.  Invalid input(s): FREET3 Anemia work up No results for input(s): VITAMINB12, FOLATE, FERRITIN, TIBC, IRON, RETICCTPCT in the last 72 hours. Urinalysis    Component Value  Date/Time   COLORURINE YELLOW 02/02/2018 1042   APPEARANCEUR CLEAR 02/02/2018 1042   LABSPEC 1.026 02/02/2018 1042   PHURINE 5.0 02/02/2018 1042   GLUCOSEU NEGATIVE 02/02/2018 1042   HGBUR NEGATIVE 02/02/2018 1042   BILIRUBINUR NEGATIVE 02/02/2018 1042   KETONESUR 20 (A) 02/02/2018 1042   PROTEINUR 30 (A) 02/02/2018 1042   UROBILINOGEN 0.2 06/23/2014 1222   NITRITE NEGATIVE 02/02/2018 1042   LEUKOCYTESUR MODERATE (A) 02/02/2018 1042   Sepsis Labs Invalid input(s): PROCALCITONIN,  WBC,  LACTICIDVEN Microbiology Recent Results (from the past 240 hour(s))  Urine culture     Status: Abnormal   Collection Time: 02/02/18 10:42 AM  Result Value Ref Range Status   Specimen Description   Final    URINE, RANDOM Performed at [facility], [address], [place], [place] [address]    Special Requests   Final    NONE Performed at [facility], [address], [place], [place] [address]    Culture MULTIPLE SPECIES PRESENT, SUGGEST RECOLLECTION (A)  Final   Report Status 02/03/2018 FINAL  Final  Respiratory Panel by PCR     Status: Abnormal   Collection Time: 02/02/18  6:18 PM  Result Value Ref Range Status   Adenovirus NOT DETECTED NOT DETECTED Final   Coronavirus 229E NOT DETECTED NOT DETECTED Final   Coronavirus HKU1 NOT DETECTED NOT DETECTED Final   Coronavirus NL63 NOT DETECTED NOT DETECTED Final   Coronavirus OC43 NOT DETECTED NOT DETECTED Final   Metapneumovirus NOT DETECTED NOT DETECTED Final   Rhinovirus / Enterovirus DETECTED (A) NOT DETECTED Final   Influenza A NOT DETECTED NOT DETECTED Final   Influenza B NOT DETECTED NOT DETECTED Final   Parainfluenza Virus 1 NOT DETECTED NOT DETECTED Final   Parainfluenza Virus 2 NOT DETECTED NOT DETECTED Final   Parainfluenza Virus 3 NOT DETECTED NOT DETECTED Final   Parainfluenza Virus 4 NOT DETECTED NOT DETECTED Final   Respiratory Syncytial Virus NOT DETECTED NOT DETECTED Final   Bordetella  pertussis NOT DETECTED NOT DETECTED Final   Chlamydophila pneumoniae NOT DETECTED NOT DETECTED Final   Mycoplasma pneumoniae NOT DETECTED NOT DETECTED Final    Comment: Performed at [facility], [address], [place], [place] [address]     Time coordinating discharge: 35 minutes  SIGNED:   Glade Lloyd, MD  [facility] 02/06/2018, 10:11 AM Pager: [phone]  If 7PM-7AM, please contact night-coverage [email or web address] Password [email or web address]

## 2018-02-06 NOTE — Care Management Important Message (Signed)
Important Message  Patient Details  Name: Yvonne Todd MRN: [id number] Date of Birth: Mar 27, 1951   Medicare Important Message Given:  Yes    Elliot CousinShavis, Amiee Wiley Ellen, RN 02/06/2018, 11:16 AM

## 2018-02-06 NOTE — Progress Notes (Signed)
SATURATION QUALIFICATIONS: (This note is used to comply with regulatory documentation for home oxygen)  Patient Saturations on Room Air at Rest = 93%  Patient Saturations on Room Air while Ambulating = 83%  Patient Saturations on 2 Liters of oxygen while Ambulating = 94 %  Please briefly explain why patient needs home oxygen: 

## 2018-02-06 NOTE — Progress Notes (Signed)
Discharge instructions and medications discussed with patient.  AVS given to patient.  All questions answered.  

## 2018-02-08 ENCOUNTER — Telehealth: Payer: Self-pay | Admitting: *Deleted

## 2018-02-08 LAB — GLUCOSE, CAPILLARY: Glucose-Capillary: 184 mg/dL — ABNORMAL HIGH (ref 65–99)

## 2018-02-08 NOTE — Telephone Encounter (Signed)
Since MD has been out of the office. Pt scheduled w/Dr. Jordan LikesSchmitz instead!!

## 2018-02-08 NOTE — Telephone Encounter (Signed)
Transition Care Management Follow-up Telephone Call   Date discharged? 02/06/18   How have you been since you were released from the hospital? Pt states she is doing ok still have cough   Do you understand why you were in the hospital? YES   Do you understand the discharge instructions? YES   Where were you discharged to? Home   Items Reviewed:  Medications reviewed: YES  Allergies reviewed: YES  Dietary changes reviewed: YES  Referrals reviewed:  No referral needed   Functional Questionnaire:   Activities of Daily Living (ADLs):   She states shey are independent in the following: bathing and hygiene, feeding, continence, grooming, toileting and dressing States they require assistance with the following: ambulation sometimes   Any transportation issues/concerns?: NO   Any patient concerns? NO   Confirmed importance and date/time of follow-up visits scheduled YES, appt 02/11/18  Provider Appointment booked with Dr. Okey Duprerawford  Confirmed with patient if condition begins to worsen call PCP or go to the ER.  Patient was given the office number and encouraged to call back with question or concerns.  : YES

## 2018-02-11 ENCOUNTER — Ambulatory Visit (INDEPENDENT_AMBULATORY_CARE_PROVIDER_SITE_OTHER): Payer: Medicare Other | Admitting: Internal Medicine

## 2018-02-11 ENCOUNTER — Encounter: Payer: Self-pay | Admitting: Internal Medicine

## 2018-02-11 VITALS — BP 136/82 | HR 80 | Temp 98.4°F | Ht 62.0 in | Wt 184.0 lb

## 2018-02-11 DIAGNOSIS — J9601 Acute respiratory failure with hypoxia: Secondary | ICD-10-CM | POA: Diagnosis not present

## 2018-02-11 DIAGNOSIS — R739 Hyperglycemia, unspecified: Secondary | ICD-10-CM

## 2018-02-11 DIAGNOSIS — T380X5A Adverse effect of glucocorticoids and synthetic analogues, initial encounter: Secondary | ICD-10-CM

## 2018-02-11 DIAGNOSIS — I5032 Chronic diastolic (congestive) heart failure: Secondary | ICD-10-CM

## 2018-02-11 DIAGNOSIS — J441 Chronic obstructive pulmonary disease with (acute) exacerbation: Secondary | ICD-10-CM | POA: Diagnosis not present

## 2018-02-11 DIAGNOSIS — I10 Essential (primary) hypertension: Secondary | ICD-10-CM

## 2018-02-11 MED ORDER — INSULIN DETEMIR 100 UNIT/ML FLEXPEN: 20.0000 [IU] | PEN_INJECTOR | Freq: Every day | SUBCUTANEOUS | 11 refills | Status: DC

## 2018-02-11 MED ORDER — ALBUTEROL SULFATE (2.5 MG/3ML) 0.083% IN NEBU: 2.5000 mg | INHALATION_SOLUTION | Freq: Three times a day (TID) | RESPIRATORY_TRACT | 6 refills | Status: DC

## 2018-02-11 MED ORDER — INSULIN ASPART 100 UNIT/ML FLEXPEN: PEN_INJECTOR | SUBCUTANEOUS | 11 refills | Status: DC

## 2018-02-11 NOTE — Assessment & Plan Note (Signed)
Ok for [facility] levemir and novolog - new rx for pens were done for both today, to cont to hold the glipizide for now until see how her sugars do after finishing the prednisone tomorrow

## 2018-02-11 NOTE — Assessment & Plan Note (Signed)
Resolving, very mild today and essentially back to baseline activity, ok for work tomorrow, refer pulmonary

## 2018-02-11 NOTE — Patient Instructions (Signed)
Ok to stay off the glipizide for now  Please continue all other medications as before, and refills have been done if requested including the pens for the levemir and novolog  Please have the pharmacy call with any other refills you may need.  Please continue your efforts at being more active, low cholesterol diet, and weight control.  Please keep your appointments with your specialists as you may have planned  You will be contacted regarding the referral for: Pulmonary  You are given the work note today

## 2018-02-11 NOTE — Assessment & Plan Note (Signed)
To cont Home o2 as is for now, f/u with pulm
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
## 2018-02-11 NOTE — Assessment & Plan Note (Signed)
stable overall by history and exam, recent data reviewed with pt, and pt to continue medical treatment as before,  to f/u any worsening symptoms or concerns BP Readings from Last 3 Encounters:  02/11/18 136/82  02/06/18 137/66  02/02/18 140/78

## 2018-02-24 ENCOUNTER — Institutional Professional Consult (permissible substitution): Payer: Medicare Other | Admitting: Pulmonary Disease

## 2018-03-25 ENCOUNTER — Ambulatory Visit: Payer: Medicare Other | Admitting: Pulmonary Disease

## 2018-03-26 ENCOUNTER — Ambulatory Visit: Payer: Medicare Other | Admitting: Pulmonary Disease

## 2018-03-26 ENCOUNTER — Ambulatory Visit (INDEPENDENT_AMBULATORY_CARE_PROVIDER_SITE_OTHER): Payer: Medicare Other | Admitting: Pulmonary Disease

## 2018-03-26 ENCOUNTER — Other Ambulatory Visit: Payer: Self-pay

## 2018-03-26 ENCOUNTER — Institutional Professional Consult (permissible substitution): Payer: Medicare Other | Admitting: Internal Medicine

## 2018-03-26 ENCOUNTER — Encounter: Payer: Self-pay | Admitting: Pulmonary Disease

## 2018-03-26 VITALS — BP 142/74 | HR 69 | Ht 62.5 in | Wt 194.2 lb

## 2018-03-26 DIAGNOSIS — Z720 Tobacco use: Secondary | ICD-10-CM

## 2018-03-26 DIAGNOSIS — R918 Other nonspecific abnormal finding of lung field: Secondary | ICD-10-CM | POA: Insufficient documentation

## 2018-03-26 DIAGNOSIS — J309 Allergic rhinitis, unspecified: Secondary | ICD-10-CM | POA: Diagnosis not present

## 2018-03-26 DIAGNOSIS — J9601 Acute respiratory failure with hypoxia: Secondary | ICD-10-CM | POA: Diagnosis not present

## 2018-03-26 DIAGNOSIS — J449 Chronic obstructive pulmonary disease, unspecified: Secondary | ICD-10-CM | POA: Diagnosis not present

## 2018-03-26 MED ORDER — INSULIN PEN NEEDLE 31G X 5 MM MISC: 0 refills | Status: DC

## 2018-03-26 MED ORDER — FLUTICASONE-UMECLIDIN-VILANT 100-62.5-25 MCG/INH IN AEPB: 1.0000 | INHALATION_SPRAY | Freq: Every day | RESPIRATORY_TRACT | 0 refills | Status: DC

## 2018-03-26 MED ORDER — MONTELUKAST SODIUM 10 MG PO TABS
10.0000 mg | ORAL_TABLET | Freq: Every day | ORAL | 11 refills | Status: DC
Start: 2018-03-26 — End: 2018-07-20

## 2018-03-26 NOTE — Progress Notes (Signed)
 @Patient  ID: Yvonne Todd, female    DOB: 01/15/1951, 67 y.o.   MRN: [id number]  Chief Complaint  Patient presents with  . Follow-up    [facility] has been worse with heat, productive cough, runny nose, clear, yellow mucus    Referring provider: Glade LloydAlekh, Kshitiz, MD  HPI: 67 year old female patient followed in our office for respiratory failure as well as COPD (GOLD COPD IV D).  Patient currently wears nocturnal O2. Last seen in our office in November/2016 by Dr. Isaiah SergeMannam Patient of Dr. Isaiah SergeMannam  Recent [place] [facility] Encounters:   07/2015-initial office visit-Mannam Patient admitted to the hospital on October/2016 for acute exacerbation of COPD.  CT on this admission did show findings consistent with emphysema.  Patient recovered with IV steroids, Levaquin, home steroid taper, started on Advair and Spiriva.  Patient reporting today that breathing has improved. Plan: Continue Advair and Spiriva as prescribed, pulmonary function test on return, return to clinic in 6 months, follow-up CT scan in 6 months to follow 6 mm left lower lobe lung nodule  Tests:   05/23/2014-pulmonary function test F/F ratio 43, FEV1 29, no significant bronchodilator response, DLCO 47, significant concavity on flow volume loops,  Imaging:  02/02/2018-chest x-ray- no active cardiopulmonary disease 06/21/2015-CT chest without contrast- scattered emphysematous changes without acute infiltrate, 6 mm left lower lobe nodule, follow-up in 6 to 12 months   Cardiac:  02/04/2018-echocardiogram-LV ejection fraction 60 to 65%, systolic function normal  Labs:  02/02/2018-respiratory virus panel-rhinovirus/enterovirus   Micro:   Chart Review:  10/15/2017-hospitalization-COPD exacerbation  02/02/2018-hospitalization-COPD exacerbation >>>02/06/2018- discharged  >>>Encouraged to follow-up with pulmonology        03/26/18 Acute   67 year old patient seen for acute visit as well as hospital follow-up.   Patient has not been seen in our office since November/2016.  Patient reports that she did not know she was supposed to follow-up with our office.  Patient also unaware that she was supposed to follow-up with a CT 6 months from her last one in 2016 that showed a left lower lobe nodule.  Patient reports that she has had multiple COPD exacerbations as well as trouble breathing over the past couple of months.  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  Her mother is now in a memory facility.  She is been able to work on stopping smoking since the stress has been decreased.  Patient reports she stopped smoking 1 week ago.  Patient has needed daytime oxygen with exertion reporting 2 to 3 L.  Patient still on nocturnal oxygen.  Patient reports she needs oxygen whenever she is outside in the heat as well as when she is moving items around her mother's home.  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  And that her breathing is worse when she is there.  Patient reports that she is had to use her rescue inhaler 2-3 times a day as well as 3 neb treatments over the last week.   MMRC - Breathlessness Score: 3 - I stop for breath after walking about 100 yards or after a few minutes on level ground (isle at grocery store is 13800ft)    Allergies  Allergen Reactions  . Fish Allergy Rash and Other (See Comments)    Some types of fish with iodine causes rash  . Iodine Rash  . Penicillins Rash and Other (See Comments)  Has patient had a PCN reaction causing immediate rash, facial/tongue/throat swelling, SOB or lightheadedness with hypotension: yes Has patient had a PCN reaction causing severe rash involving mucus membranes or skin necrosis: no Has patient had a PCN reaction that required hospitalization: no Has patient had a PCN reaction occurring within the last 10 years: no If all of the above answers are  "NO", then may proceed with Cephalosporin use.   . Tetracyclines & Related Rash    Immunization History  Administered Date(s) Administered  . Influenza, High Dose Seasonal PF 05/27/2017  . Influenza,inj,Quad PF,6+ Mos 04/27/2014, 06/20/2015, 05/31/2016  . Pneumococcal Conjugate-13 07/01/2016  . Pneumococcal Polysaccharide-23 07/06/2014    Past Medical History:  Diagnosis Date  . Allergy    Iodine, Penicillin, Tetracycline.  Patient reports reaction of hives, rash, no sob, no wheezing   . Arthritis    .Right knee, right hip  . Asthma   . Depression    Past year depression not sleeping after fiancee deceased 2013/01/09  . Diabetes (HCC)   . Hyperlipidemia    Dx 03/2014  . Hypertension    Dx 03/2014  . Pneumonia    hx of recently po antibiotics  . Primary localized osteoarthritis of right knee     Tobacco History: Social History   Tobacco Use  Smoking Status Former Smoker  . Packs/day: 1.00  . Years: 40.00  . Pack years: 40.00  . Types: Cigarettes  . Start date: 09/01/1973  . Last attempt to quit: 03/19/2018  . Years since quitting: 0.0  Smokeless Tobacco Never Used   Counseling given: Not Answered Praised patient on stopping smoking.  Encouraged her to keep it up.  Educated patient that if she continues to smoke or continues to be exposed to smoke it will be difficult for [place] to manage her COPD.   Outpatient Encounter Medications as of 03/26/2018  Medication Sig  . albuterol (PROVENTIL HFA;VENTOLIN HFA) 108 (90 Base) MCG/ACT inhaler Inhale 2 puffs into the lungs every 4 (four) hours as needed for wheezing or shortness of breath.  Marland Kitchen amLODipine (NORVASC) 10 MG tablet Take 1 tablet (10 mg total) by mouth daily.  Marland Kitchen aspirin EC 81 MG tablet Take 1 tablet (81 mg total) by mouth daily.  Marland Kitchen atorvastatin (LIPITOR) 40 MG tablet Take 1 tablet (40 mg total) by mouth at bedtime.  . calcium carbonate (CALCIUM 600) 600 MG TABS tablet Take 600 mg by mouth 2 (two) times daily with a meal.  .  diphenhydrAMINE (BENADRYL) 25 MG tablet Take 50 mg by mouth at bedtime.  . diphenhydramine-acetaminophen (TYLENOL PM) 25-500 MG TABS tablet Take 2 tablets by mouth at bedtime as needed (For pain.).  Marland Kitchen fluticasone (FLONASE) 50 MCG/ACT nasal spray Place 2 sprays into both nostrils 2 (two) times daily as needed for allergies. (Patient taking differently: Place 2 sprays into both nostrils daily. )  . guaiFENesin (MUCINEX) 600 MG 12 hr tablet Take 1 tablet (600 mg total) by mouth 2 (two) times daily.  . insulin aspart (NOVOLOG FLEXPEN) 100 UNIT/ML FlexPen Use as directed four times per day per sliding scale  . Insulin Detemir (LEVEMIR) 100 UNIT/ML Pen Inject 20 Units into the skin daily at 10 pm.  . Insulin Pen Needle 31G X 5 MM MISC levemir 20 units at bedtime and novolog as per sliding scale  . Melatonin 5 MG TABS Take 5 mg by mouth at bedtime as needed (sleep).   . Multiple Vitamin (MULTIVITAMIN) tablet Take 1 tablet by mouth daily.  Marland Kitchen  umeclidinium-vilanterol (ANORO ELLIPTA) 62.5-25 MCG/INH AEPB Inhale 1 puff into the lungs daily.  . [DISCONTINUED] nystatin-triamcinolone ointment (MYCOLOG) Apply 1 application topically 2 (two) times daily.  . [DISCONTINUED] polyvinyl alcohol (LIQUIFILM TEARS) 1.4 % ophthalmic solution Place 2 drops into both eyes as needed for dry eyes.   . [DISCONTINUED] predniSONE (DELTASONE) 20 MG tablet Take 2 tablets (40 mg total) by mouth daily with breakfast.  . albuterol (PROVENTIL) (2.5 MG/3ML) 0.083% nebulizer solution Take 3 mLs (2.5 mg total) by nebulization 3 (three) times daily. (Patient not taking: Reported on 03/26/2018)  . budesonide (PULMICORT) 0.5 MG/2ML nebulizer solution Take 2 mLs (0.5 mg total) by nebulization 2 (two) times daily. (Patient not taking: Reported on 03/26/2018)  . ipratropium-albuterol (DUONEB) 0.5-2.5 (3) MG/3ML SOLN Take 3 mLs by nebulization every 4 (four) hours as needed (shortness of breath). (Patient not taking: Reported on 03/26/2018)  .  montelukast (SINGULAIR) 10 MG tablet Take 1 tablet (10 mg total) by mouth at bedtime.  . nitroGLYCERIN (NITROSTAT) 0.4 MG SL tablet Place 1 tablet (0.4 mg total) under the tongue every 5 (five) minutes as needed for chest pain. (Patient not taking: Reported on 03/26/2018)   No facility-administered encounter medications on file as of 03/26/2018.      Review of Systems  Review of Systems  Constitutional: Positive for fatigue. Negative for chills, fever and unexpected weight change.  HENT: Positive for congestion, sinus pressure and sinus pain. Negative for ear pain, postnasal drip, rhinorrhea, sneezing and sore throat.   Respiratory: Positive for chest tightness, shortness of breath and wheezing (occasional ). Negative for cough.   Cardiovascular: Negative for chest pain and palpitations.  Gastrointestinal: Positive for nausea. Negative for blood in stool, diarrhea and vomiting.  Genitourinary: Negative for dysuria, frequency and urgency.  Musculoskeletal: Negative for arthralgias.  Skin: Negative for color change.  Allergic/Immunologic: Negative for environmental allergies and food allergies.  Neurological: Positive for headaches (sinus). Negative for dizziness and light-headedness.  Psychiatric/Behavioral: Negative for dysphoric mood. The patient is nervous/anxious.         Physical Exam  BP (!) 142/74 (BP Location: Left Arm, Cuff Size: Normal)   Pulse 69   Ht 5' 2.5" (1.588 m)   Wt 194 lb 3.2 oz (88.1 kg)   SpO2 93%   BMI 34.95 kg/m   Wt Readings from Last 5 Encounters:  03/26/18 194 lb 3.2 oz (88.1 kg)  02/11/18 184 lb (83.5 kg)  02/02/18 183 lb (83 kg)  02/02/18 183 lb (83 kg)  10/20/17 191 lb (86.6 kg)     Physical Exam  Constitutional: She is oriented to person, place, and time and well-developed, well-nourished, and in no distress. No distress.  HENT:  Head: Normocephalic and atraumatic.  Right Ear: Hearing, tympanic membrane, external ear and ear canal normal.    Left Ear: Hearing, tympanic membrane, external ear and ear canal normal.  Nose: Nose normal. Right sinus exhibits no maxillary sinus tenderness and no frontal sinus tenderness. Left sinus exhibits no maxillary sinus tenderness and no frontal sinus tenderness.  Mouth/Throat: Uvula is midline and oropharynx is clear and moist. No oropharyngeal exudate.  Ethmoid sinus slight tenderness, TMs with effusion bilaterally, without infection  Eyes: Pupils are equal, round, and reactive to light.  Neck: Normal range of motion. Neck supple. No JVD present.  Cardiovascular: Normal rate, regular rhythm and normal heart sounds.  Pulmonary/Chest: Effort normal. No accessory muscle usage. No respiratory distress. She has no decreased breath sounds. She has wheezes (slight exp wheeze ).  She has no rhonchi.  Slight barrel chest   Abdominal: Soft. Bowel sounds are normal. There is no tenderness.  Musculoskeletal: Normal range of motion. She exhibits no edema.  Lymphadenopathy:    She has no cervical adenopathy.  Neurological: She is alert and oriented to person, place, and time. Gait normal.  Skin: Skin is warm and dry. She is not diaphoretic. No erythema.  Psychiatric: Mood, memory, affect and judgment normal.  Nursing note and vitals reviewed.      Lab Results:  CBC    Component Value Date/Time   WBC 8.6 02/05/2018 0556   RBC 4.59 02/05/2018 0556   HGB 14.4 02/05/2018 0556   HCT 44.2 02/05/2018 0556   PLT 284 02/05/2018 0556   MCV 96.3 02/05/2018 0556   MCH 31.4 02/05/2018 0556   MCHC 32.6 02/05/2018 0556   RDW 13.3 02/05/2018 0556   LYMPHSABS 0.5 (L) 02/05/2018 0556   MONOABS 0.3 02/05/2018 0556   EOSABS 0.0 02/05/2018 0556   BASOSABS 0.0 02/05/2018 0556    BMET    Component Value Date/Time   NA 142 02/05/2018 0556   K 4.8 02/05/2018 0556   CL 97 (L) 02/05/2018 0556   CO2 37 (H) 02/05/2018 0556   GLUCOSE 327 (H) 02/05/2018 0556   BUN 19 02/05/2018 0556   CREATININE 0.59 02/05/2018  0556   CALCIUM 8.8 (L) 02/05/2018 0556   GFRNONAA >60 02/05/2018 0556   GFRAA >60 02/05/2018 0556    BNP    Component Value Date/Time   BNP 22.7 02/02/2018 1042    ProBNP No results found for: PROBNP  Imaging: No results found.   Assessment & Plan:   Pleasant 67 year old patient seen office visit today.  Encourage patient to keep follow-up appointments with [place] in the future and to follow-up with our office if she is having issues with her breathing.  Patient agrees.  Also referred to lung cancer screening program, will delay shared decision making for a few months as patient is about to get a CT completed. 40 pack year smoking history.   Will order CT to follow left lower lobe nodule seen in 2016.  We will give patient trial of trelegy Ellipta in place of her Anoro to see if she feels that this manages her breathing better.  Will trial patient on daily Singulair to see if this helps with allergic rhinitis symptoms she is having.  Patient to continue using Benadryl at night as she prefers this over second-generation antihistamines.  Follow-up with Dr. Isaiah Serge in 6 to 8 weeks.  We can do walk for POC at that time.  GOLD COPD IV D  Trelegy Ellipta  >>> 1 puff daily  >>>rinse mouth out after use  >>> This replaces your Anoro Ellipta inhaler  Contact our office if you like your trelegy Ellipta and we can place an order for this.  I know that you have 2 months of Anoro Ellipta at home.  I am fine with you using that.  I personally think that he will do better on the trilogy Ellipta long-term.  Contact our office if breathing worsens when you transition back to the Anoro.  Follow-up in 6 to 8 weeks to see Dr. Isaiah Serge   Acute respiratory failure with hypoxia (HCC) Continue nocturnal oxygen Continue 2 L with exertion at home At next office appointment consider walk for POC  Tobacco abuse Praised patient for stopping smoking  CT without contrast ordered to follow left lower lobe  nodule from 2016  Continue not Smoking:  1 800 QUIT NOW  >>> Patient to call this resource and utilize it to help support her quit smoking >>> Keep up your hard work with stopping smoking  You can also contact the [facility] >>>For smoking cessation classes call [phone]  We do not recommend using e-cigarettes as a form of stopping smoking  Continue to avoid being around other people who smoke  We will refer you today to her lung cancer screening program >>>This is based off of your pack-year smoking history >>> This is a recommendation from the [place] preventative services task force (USPSTF) >>>The USPSTF recommends annual screening for lung cancer with low-dose computed tomography (LDCT) in adults aged 68 to 80 years who have a 30 pack-year smoking history and currently smoke or have quit within the past 15 years. Screening should be discontinued once a person has not smoked for 15 years or develops a health problem that substantially limits life expectancy or the ability or willingness to have curative lung surgery.   Our office will call you and set up an appointment with Kandice Robinsons (Nurse Practitioner) who leads this program.  This appointment takes place in our office.  After completing this meeting with Kandice Robinsons NP you will get a low-dose CT as the screening >>>We will call you with those results  Follow-up in 6 to 8 weeks to see Dr. Isaiah Serge   Abnormal findings on diagnostic imaging of lung CT without contrast ordered to follow left lower lobe nodule from 2016  Continue to avoid being around other people who smoke  We will refer you today to her lung cancer screening program >>>This is based off of your pack-year smoking history >>> This is a recommendation from the [place] preventative services task force (USPSTF) >>>The USPSTF recommends annual screening for lung cancer with low-dose computed tomography (LDCT) in adults aged 70 to 80 years who have a 30  pack-year smoking history and currently smoke or have quit within the past 15 years. Screening should be discontinued once a person has not smoked for 15 years or develops a health problem that substantially limits life expectancy or the ability or willingness to have curative lung surgery.   Our office will call you and set up an appointment with Kandice Robinsons (Nurse Practitioner) who leads this program.  This appointment takes place in our office.  After completing this meeting with Kandice Robinsons NP you will get a low-dose CT as the screening >>>We will call you with those results  Follow-up in 6 to 8 weeks to see Dr. Isaiah Serge   Allergic rhinitis Start Singulair today Continued Benadryl at night Flonase as needed for nasal congestion   This appointment was 43 minutes along with her 50% of that time in direct face-to-face patient care, assessment, follow-up.  Coral Ceo, NP 03/26/2018

## 2018-03-26 NOTE — Assessment & Plan Note (Signed)
Praised patient for stopping smoking  CT without contrast ordered to follow left lower lobe nodule from 2016  Continue not Smoking:  1 800 QUIT NOW  >>> Patient to call this resource and utilize it to help support her quit smoking >>> Keep up your hard work with stopping smoking  You can also contact the [facility] >>>For smoking cessation classes call [phone]  We do not recommend using e-cigarettes as a form of stopping smoking  Continue to avoid being around other people who smoke  We will refer you today to her lung cancer screening program >>>This is based off of your pack-year smoking history >>> This is a recommendation from the [place] preventative services task force (USPSTF) >>>The USPSTF recommends annual screening for lung cancer with low-dose computed tomography (LDCT) in adults aged 68 to 80 years who have a 30 pack-year smoking history and currently smoke or have quit within the past 15 years. Screening should be discontinued once a person has not smoked for 15 years or develops a health problem that substantially limits life expectancy or the ability or willingness to have curative lung surgery.   Our office will call you and set up an appointment with Kandice RobinsonsSarah Groce (Nurse Practitioner) who leads this program.  This appointment takes place in our office.  After completing this meeting with Kandice RobinsonsSarah Groce NP you will get a low-dose CT as the screening >>>We will call you with those results  Follow-up in 6 to 8 weeks to see Dr. Isaiah SergeMannam

## 2018-03-26 NOTE — Assessment & Plan Note (Signed)
Continue nocturnal oxygen Continue 2 L with exertion at home At next office appointment consider walk for POC

## 2018-03-26 NOTE — Assessment & Plan Note (Signed)
Start Singulair today Continued Benadryl at night Flonase as needed for nasal congestion

## 2018-03-26 NOTE — Assessment & Plan Note (Signed)
  Trelegy Ellipta  >>> 1 puff daily  >>>rinse mouth out after use  >>> This replaces your Anoro Ellipta inhaler  Contact our office if you like your trelegy Ellipta and we can place an order for this.  I know that you have 2 months of Anoro Ellipta at home.  I am fine with you using that.  I personally think that he will do better on the trilogy Ellipta long-term.  Contact our office if breathing worsens when you transition back to the Anoro.  Follow-up in 6 to 8 weeks to see Dr. Isaiah SergeMannam

## 2018-03-26 NOTE — Patient Instructions (Addendum)
CT without contrast ordered to follow left lower lobe nodule from 2016  Trelegy Ellipta  >>> 1 puff daily  >>>rinse mouth out after use  >>> This replaces your Anoro Ellipta inhaler  Contact our office if you like your trelegy Ellipta and we can place an order for this.  I know that you have 2 months of Anoro Ellipta at home.  I am fine with you using that.  I personally think that he will do better on the trilogy Ellipta long-term.  Contact our office if breathing worsens when you transition back to the Anoro.  Will order Singulair 10 mg, take this daily Can continue doing Benadryl at night Flonase 1 spray each nostril every 12 hours Can do nasal saline rinses as well to help with symptoms   Continue not Smoking:  1 800 QUIT NOW  >>> Patient to call this resource and utilize it to help support her quit smoking >>> Keep up your hard work with stopping smoking  [facility] also [facility] >>>For smoking cessation classes call [phone]  We do not recommend using e-cigarettes as a form of stopping smoking  Continue to avoid being around other people who smoke  We will refer you today to her lung cancer screening program >>>This is based off of your pack-year smoking history >>> This is a recommendation from the [place] preventative services task force (USPSTF) >>>The USPSTF recommends annual screening for lung cancer with low-dose computed tomography (LDCT) in adults aged 68 to 80 years who have a 30 pack-year smoking history and currently smoke or have quit within the past 15 years. Screening should be discontinued once a person has not smoked for 15 years or develops a health problem that substantially limits life expectancy or the ability or willingness to have curative lung surgery.   Our office will call you and set up an appointment with Kandice RobinsonsSarah Groce (Nurse Practitioner) who leads this program.  This appointment takes place in our office.  After completing  this meeting with Kandice RobinsonsSarah Groce NP you will get a low-dose CT as the screening >>>We will call you with those results    Follow-up in 6 to 8 weeks to see Dr. Isaiah SergeMannam    Please contact the office if your symptoms worsen or you have concerns that you are not improving.   Thank you for choosing [facility] for your healthcare, and for allowing us to partner with you on your healthcare journey. I am thankful to be able to provide care to you today.   Elisha HeadlandBrian Rakesha Dalporto FNP-C         Chronic Obstructive Pulmonary Disease Chronic obstructive pulmonary disease (COPD) is a long-term (chronic) lung problem. When you have COPD, it is hard for air to get in and out of your lungs. The way your lungs work will never return to normal. Usually the condition gets worse over time. There are things you can do to keep yourself as healthy as possible. Your doctor may treat your condition with:  Medicines.  Quitting smoking, if you smoke.  Rehabilitation. This may involve a team of specialists.  Oxygen.  Exercise and changes to your diet.  Lung surgery.  Comfort measures (palliative care).  Follow these instructions at home: Medicines  Take over-the-counter and prescription medicines only as told by your doctor.  Talk to your doctor before taking any cough or allergy medicines. You may need to avoid medicines that cause your lungs to be dry. Lifestyle  If you smoke, stop. Smoking makes the problem worse. If you need help quitting, ask your doctor.  Avoid being around things that make your breathing worse. This may include smoke, chemicals, and fumes.  Stay active, but remember to also rest.  Learn and use tips on how to relax.  Make sure you get enough sleep. Most adults need at least 7 hours a night.  Eat healthy foods. Eat smaller meals more often. Rest before meals. Controlled breathing  Learn and use tips on how to control your breathing as told by your doctor.  Try: ? Breathing in (inhaling) through your nose for 1 second. Then, pucker your lips and breath out (exhale) through your lips for 2 seconds. ? Putting one hand on your belly (abdomen). Breathe in slowly through your nose for 1 second. Your hand on your belly should move out. Pucker your lips and breathe out slowly through your lips. Your hand on your belly should move in as you breathe out. Controlled coughing  Learn and use controlled coughing to clear mucus from your lungs. The steps are: 1. Lean your head a little forward. 2. Breathe in deeply. 3. Try to hold your breath for 3 seconds. 4. Keep your mouth slightly open while coughing 2 times. 5. Spit any mucus out into a tissue. 6. Rest and do the steps again 1 or 2 times as needed. General instructions  Make sure you get all the shots (vaccines) that your doctor recommends. Ask your doctor about a flu shot and a pneumonia shot.  Use oxygen therapy and therapy to help improve your lungs (pulmonary rehabilitation) if told by your doctor. If you need home oxygen therapy, ask your doctor if you should buy a tool to measure your oxygen level (oximeter).  Make a COPD action plan with your doctor. This helps you know what to do if you feel worse than usual.  Manage any other conditions you have as told by your doctor.  Avoid going outside when it is very hot, cold, or humid.  Avoid people who have a sickness you can catch (contagious).  Keep all follow-up visits as told by your doctor. This is important. Contact a doctor if:  You cough up more mucus than usual.  There is a change in the color or thickness of the mucus.  It is harder to breathe than usual.  Your breathing is faster than usual.  You have trouble sleeping.  You need to use your medicines more often than usual.  You have trouble doing your normal activities such as getting dressed or walking around the house. Get help right away if:  You have shortness of breath  while resting.  You have shortness of breath that stops you from: ? Being able to talk. ? Doing normal activities.  Your chest hurts for longer than 5 minutes.  Your skin color is more blue than usual.  Your pulse oximeter shows that you have low oxygen for longer than 5 minutes.  You have a fever.  You feel too tired to breathe normally. Summary  Chronic obstructive pulmonary disease (COPD) is a long-term lung problem.  The way your lungs work will never return to normal. Usually the condition gets worse over time. There are things you can do to keep yourself as healthy as possible.  Take over-the-counter and prescription medicines only as told by your doctor.  If you smoke, stop. Smoking makes the problem worse. This information is not intended to replace advice given to  you by your health care provider. Make sure you discuss any questions you have with your health care provider. Document Released: 02/04/2008 Document Revised: 01/24/2016 Document Reviewed: 04/14/2013 Elsevier Interactive Patient Education  2017 Elsevier Inc[employer]     Coping with Quitting Smoking Quitting smoking is a physical and mental challenge. You will face cravings, withdrawal symptoms, and temptation. Before quitting, work with your health care provider to make a plan that can help you cope. Preparation can help you quit and keep you from giving in. How can I cope with cravings? Cravings usually last for 5-10 minutes. If you get through it, the craving will pass. Consider taking the following actions to help you cope with cravings:  Keep your mouth busy: ? Chew sugar-free gum. ? Suck on hard candies or a straw. ? Brush your teeth.  Keep your hands and body busy: ? Immediately change to a different activity when you feel a craving. ? Squeeze or play with a ball. ? Do an activity or a hobby, like making bead jewelry, practicing needlepoint, or working with wood. ? Mix up your normal routine. ? Take a  short exercise break. Go for a quick walk or run up and down stairs. ? Spend time in public places where smoking is not allowed.  Focus on doing something kind or helpful for someone else.  Call a friend or family member to talk during a craving.  Join a support group.  Call a quit line, such as 1-800-QUIT-NOW.  Talk with your health care provider about medicines that might help you cope with cravings and make quitting easier for you.  How can I deal with withdrawal symptoms? Your body may experience negative effects as it tries to get used to not having nicotine in the system. These effects are called withdrawal symptoms. They may include:  Feeling hungrier than normal.  Trouble concentrating.  Irritability.  Trouble sleeping.  Feeling depressed.  Restlessness and agitation.  Craving a cigarette.  To manage withdrawal symptoms:  Avoid places, people, and activities that trigger your cravings.  Remember why you want to quit.  Get plenty of sleep.  Avoid coffee and other caffeinated drinks. These may worsen some of your symptoms.  How can I handle social situations? Social situations can be difficult when you are quitting smoking, especially in the first few weeks. To manage this, you can:  Avoid parties, bars, and other social situations where people might be smoking.  Avoid alcohol.  Leave right away if you have the urge to smoke.  Explain to your family and friends that you are quitting smoking. Ask for understanding and support.  Plan activities with friends or family where smoking is not an option.  What are some ways I can cope with stress? Wanting to smoke may cause stress, and stress can make you want to smoke. Find ways to manage your stress. Relaxation techniques can help. For example:  Breathe slowly and deeply, in through your nose and out through your mouth.  Listen to soothing, relaxing music.  Talk with a family member or friend about your  stress.  Light a candle.  Soak in a bath or take a shower.  Think about a peaceful place.  What are some ways I can prevent weight gain? Be aware that many people gain weight after they quit smoking. However, not everyone does. To keep from gaining weight, have a plan in place before you quit and stick to the plan after you quit. Your plan should include:  Having healthy snacks. When you have a craving, it may help to: ? Eat plain popcorn, crunchy carrots, celery, or other cut vegetables. ? Chew sugar-free gum.  Changing how you eat: ? Eat small portion sizes at meals. ? Eat 4-6 small meals throughout the day instead of 1-2 large meals a day. ? Be mindful when you eat. Do not watch television or do other things that might distract you as you eat.  Exercising regularly: ? Make time to exercise each day. If you do not have time for a long workout, do short bouts of exercise for 5-10 minutes several times a day. ? Do some form of strengthening exercise, like weight lifting, and some form of aerobic exercise, like running or swimming.  Drinking plenty of water or other low-calorie or no-calorie drinks. Drink 6-8 glasses of water daily, or as much as instructed by your health care provider.  Summary  Quitting smoking is a physical and mental challenge. You will face cravings, withdrawal symptoms, and temptation to smoke again. Preparation can help you as you go through these challenges.  You can cope with cravings by keeping your mouth busy (such as by chewing gum), keeping your body and hands busy, and making calls to family, friends, or a helpline for people who want to quit smoking.  You can cope with withdrawal symptoms by avoiding places where people smoke, avoiding drinks with caffeine, and getting plenty of rest.  Ask your health care provider about the different ways to prevent weight gain, avoid stress, and handle social situations. This information is not intended to replace  advice given to you by your health care provider. Make sure you discuss any questions you have with your health care provider. Document Released: 08/15/2016 Document Revised: 08/15/2016 Document Reviewed: 08/15/2016 Elsevier Interactive Patient Education  [employer].

## 2018-03-26 NOTE — Assessment & Plan Note (Signed)
CT without contrast ordered to follow left lower lobe nodule from 2016  Continue to avoid being around other people who smoke  We will refer you today to her lung cancer screening program >>>This is based off of your pack-year smoking history >>> This is a recommendation from the [place] preventative services task force (USPSTF) >>>The USPSTF recommends annual screening for lung cancer with low-dose computed tomography (LDCT) in adults aged 68 to 80 years who have a 30 pack-year smoking history and currently smoke or have quit within the past 15 years. Screening should be discontinued once a person has not smoked for 15 years or develops a health problem that substantially limits life expectancy or the ability or willingness to have curative lung surgery.   Our office will call you and set up an appointment with Kandice RobinsonsSarah Groce (Nurse Practitioner) who leads this program.  This appointment takes place in our office.  After completing this meeting with Kandice RobinsonsSarah Groce NP you will get a low-dose CT as the screening >>>We will call you with those results  Follow-up in 6 to 8 weeks to see Dr. Isaiah SergeMannam

## 2018-03-31 ENCOUNTER — Ambulatory Visit (INDEPENDENT_AMBULATORY_CARE_PROVIDER_SITE_OTHER)
Admission: RE | Admit: 2018-03-31 | Discharge: 2018-03-31 | Disposition: A | Discharge status: Home / Self Care | Payer: Medicare Other | Source: Ambulatory Visit | Attending: Pulmonary Disease | Admitting: Pulmonary Disease

## 2018-03-31 DIAGNOSIS — J449 Chronic obstructive pulmonary disease, unspecified: Secondary | ICD-10-CM | POA: Diagnosis not present

## 2018-03-31 DIAGNOSIS — R918 Other nonspecific abnormal finding of lung field: Secondary | ICD-10-CM | POA: Diagnosis not present

## 2018-03-31 DIAGNOSIS — R911 Solitary pulmonary nodule: Secondary | ICD-10-CM | POA: Diagnosis not present

## 2018-03-31 DIAGNOSIS — Z720 Tobacco use: Secondary | ICD-10-CM

## 2018-04-01 ENCOUNTER — Telehealth: Payer: Self-pay | Admitting: Pulmonary Disease

## 2018-04-01 ENCOUNTER — Other Ambulatory Visit: Payer: Self-pay | Admitting: Pulmonary Disease

## 2018-04-01 NOTE — Progress Notes (Signed)
CT chest results of come back.  6 mm left lower lobe nodule has resolved.  Compatible with benign process.  We will not order any more follow-up CTs at this time.  Will have you keep follow-up appointment with Dr. Isaiah SergeMannam.   Elisha HeadlandBrian Torin Modica FNP

## 2018-04-01 NOTE — Telephone Encounter (Signed)
Notes recorded by Coral CeoMack, Brian P, NP on 04/01/2018 at 10:20 AM EDT CT chest results of come back. 6 mm left lower lobe nodule has resolved. Compatible with benign process. We will not order any more follow-up CTs at this time. Will have you keep follow-up appointment with Dr. Isaiah SergeMannam.   Elisha HeadlandBrian Mack FNP ----------------------------------------------------- Attempted to contact pt. I did not receive an answer. There was no option for me to leave a message. Will try back.

## 2018-04-01 NOTE — Telephone Encounter (Signed)
Called and spoke with patient regarding results.  Informed the patient of results and recommendations today. Pt verbalized understanding and denied any questions or concerns at this time.  Nothing further needed.  

## 2018-04-01 NOTE — Telephone Encounter (Signed)
Pt is calling back [phone]

## 2018-04-26 ENCOUNTER — Other Ambulatory Visit: Payer: Self-pay | Admitting: Internal Medicine

## 2018-05-10 ENCOUNTER — Encounter: Payer: Self-pay | Admitting: Pulmonary Disease

## 2018-05-10 ENCOUNTER — Ambulatory Visit (INDEPENDENT_AMBULATORY_CARE_PROVIDER_SITE_OTHER): Payer: Medicare Other | Admitting: Pulmonary Disease

## 2018-05-10 VITALS — BP 142/80 | HR 68 | Ht 62.5 in | Wt 193.4 lb

## 2018-05-10 DIAGNOSIS — Z23 Encounter for immunization: Secondary | ICD-10-CM | POA: Diagnosis not present

## 2018-05-10 DIAGNOSIS — J449 Chronic obstructive pulmonary disease, unspecified: Secondary | ICD-10-CM | POA: Diagnosis not present

## 2018-05-10 DIAGNOSIS — J9601 Acute respiratory failure with hypoxia: Secondary | ICD-10-CM

## 2018-05-10 MED ORDER — ALBUTEROL SULFATE (2.5 MG/3ML) 0.083% IN NEBU: 2.5000 mg | INHALATION_SOLUTION | Freq: Three times a day (TID) | RESPIRATORY_TRACT | 5 refills | Status: DC

## 2018-05-10 MED ORDER — ALBUTEROL SULFATE HFA 108 (90 BASE) MCG/ACT IN AERS: 2.0000 | INHALATION_SPRAY | RESPIRATORY_TRACT | 5 refills | Status: DC | PRN

## 2018-05-10 MED ORDER — FLUTICASONE-UMECLIDIN-VILANT 100-62.5-25 MCG/INH IN AEPB: 1.0000 | INHALATION_SPRAY | Freq: Every day | RESPIRATORY_TRACT | 5 refills | Status: DC

## 2018-05-10 MED ORDER — BUDESONIDE 0.5 MG/2ML IN SUSP: 0.5000 mg | Freq: Two times a day (BID) | RESPIRATORY_TRACT | 5 refills | Status: DC

## 2018-05-10 NOTE — Patient Instructions (Signed)
I am glad you are doing well with your breathing We will renew your trelegy and call in a prescription We will evaluate you for portable concentrator Flu vaccination today Follow-up in 6 months with Elisha Headland, NP
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
## 2018-06-10 ENCOUNTER — Other Ambulatory Visit: Payer: Self-pay | Admitting: Internal Medicine

## 2018-07-20 ENCOUNTER — Telehealth: Payer: Self-pay | Admitting: *Deleted

## 2018-07-20 DIAGNOSIS — J9601 Acute respiratory failure with hypoxia: Secondary | ICD-10-CM

## 2018-07-20 DIAGNOSIS — J449 Chronic obstructive pulmonary disease, unspecified: Secondary | ICD-10-CM

## 2018-07-20 MED ORDER — MONTELUKAST SODIUM 10 MG PO TABS: 10.0000 mg | ORAL_TABLET | Freq: Every day | ORAL | 2 refills | Status: DC

## 2018-07-20 NOTE — Telephone Encounter (Signed)
Called and spoke with patient to schedule an appointment, patient's mother passed away last night, so she will call back and make the appointment. A reminder was placed to check on patient to see if appt was scheduled for patient.   Nothing further needed.

## 2018-07-26 ENCOUNTER — Encounter: Payer: Medicare Other | Admitting: Internal Medicine

## 2018-08-03 ENCOUNTER — Ambulatory Visit: Payer: Medicare Other | Admitting: Pulmonary Disease

## 2018-08-04 ENCOUNTER — Ambulatory Visit: Payer: Medicare Other | Admitting: Internal Medicine

## 2018-08-04 DIAGNOSIS — Z0289 Encounter for other administrative examinations: Secondary | ICD-10-CM

## 2018-08-08 IMAGING — CR DG CHEST 2V
2 series · 2 of 2 positions shown · non-contrast
Comparison: CT chest and PA and lateral chest 06/21/2015.

CLINICAL DATA: Congestion and productive cough for 4 days.

EXAM:
CHEST  2 VIEW

[w chest pa]
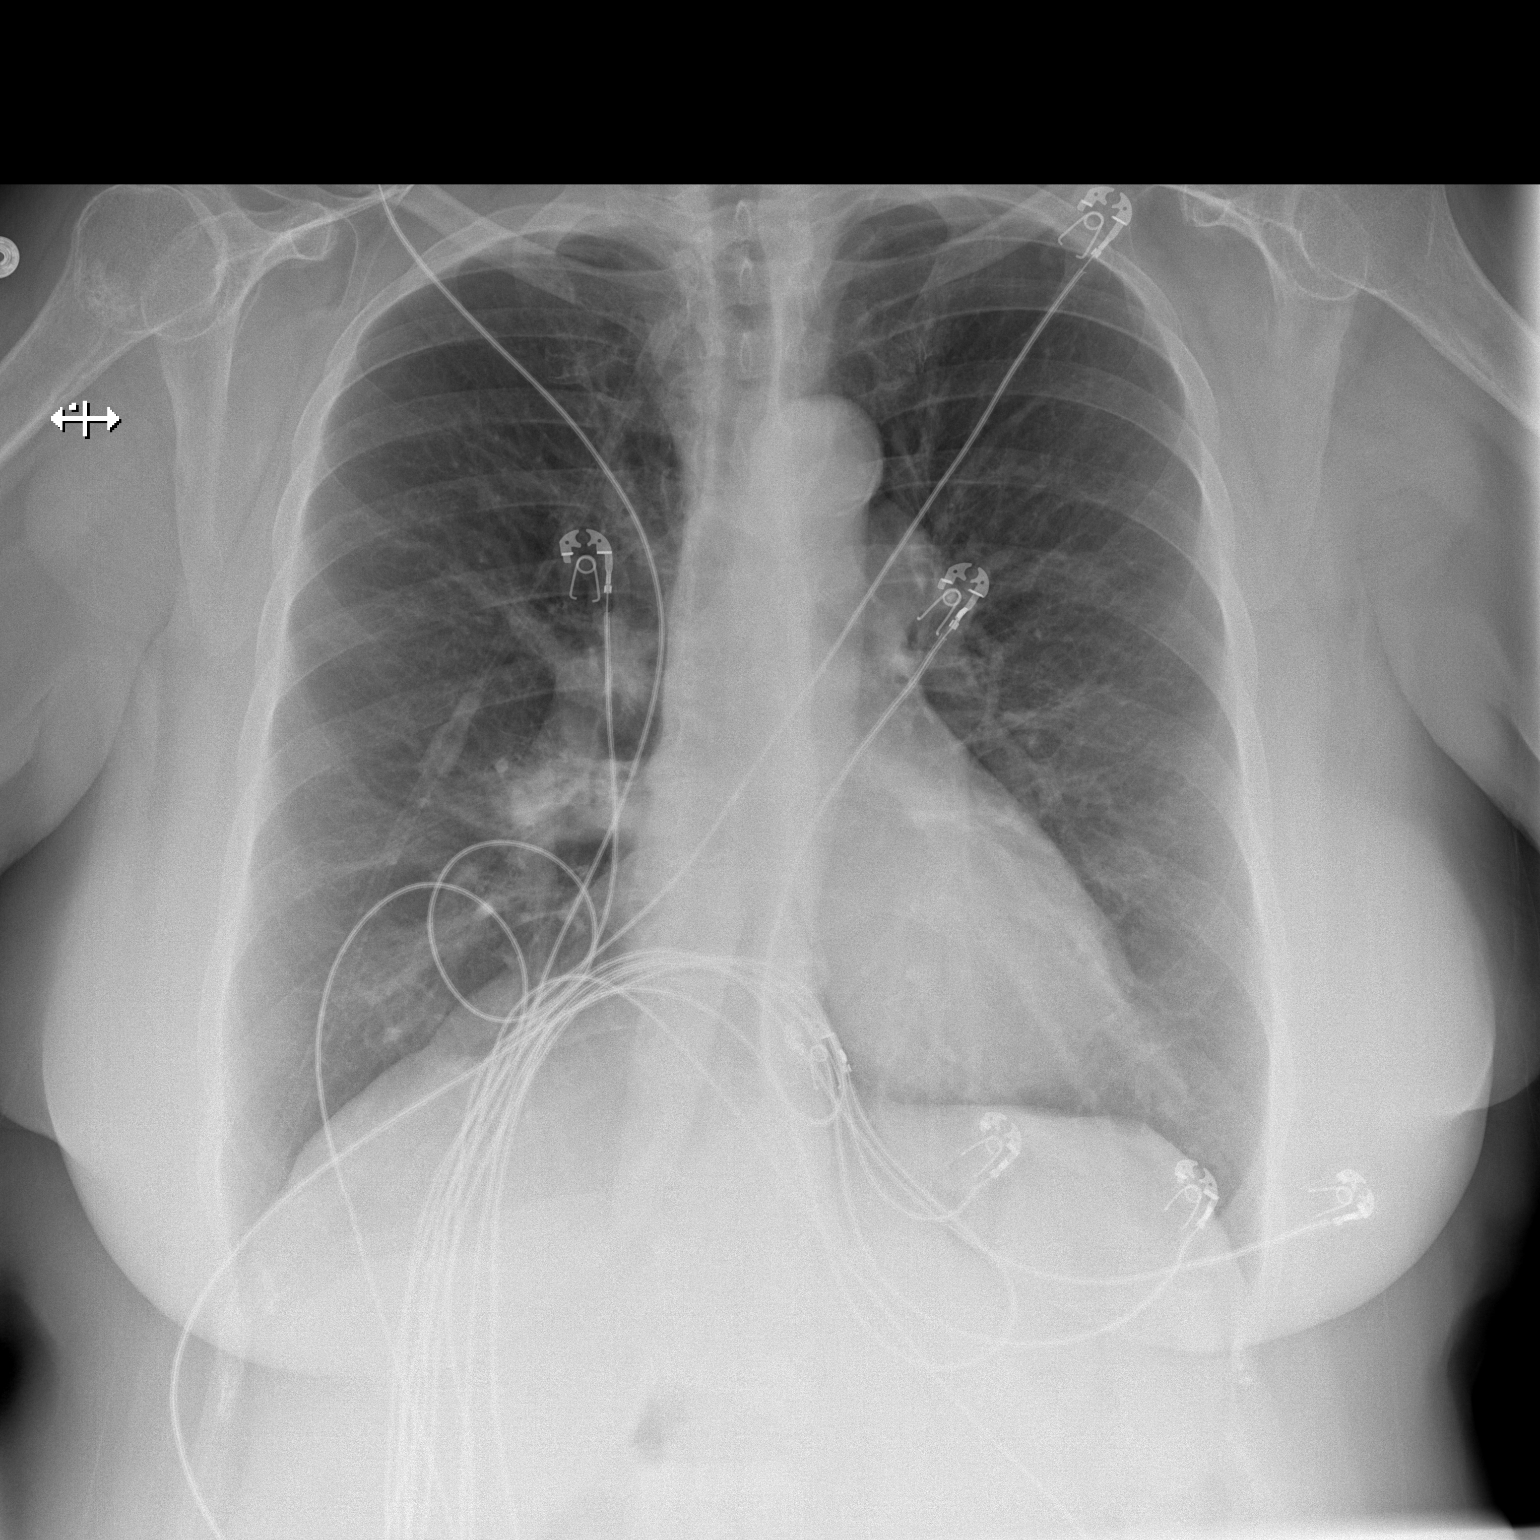

[w chest lat]
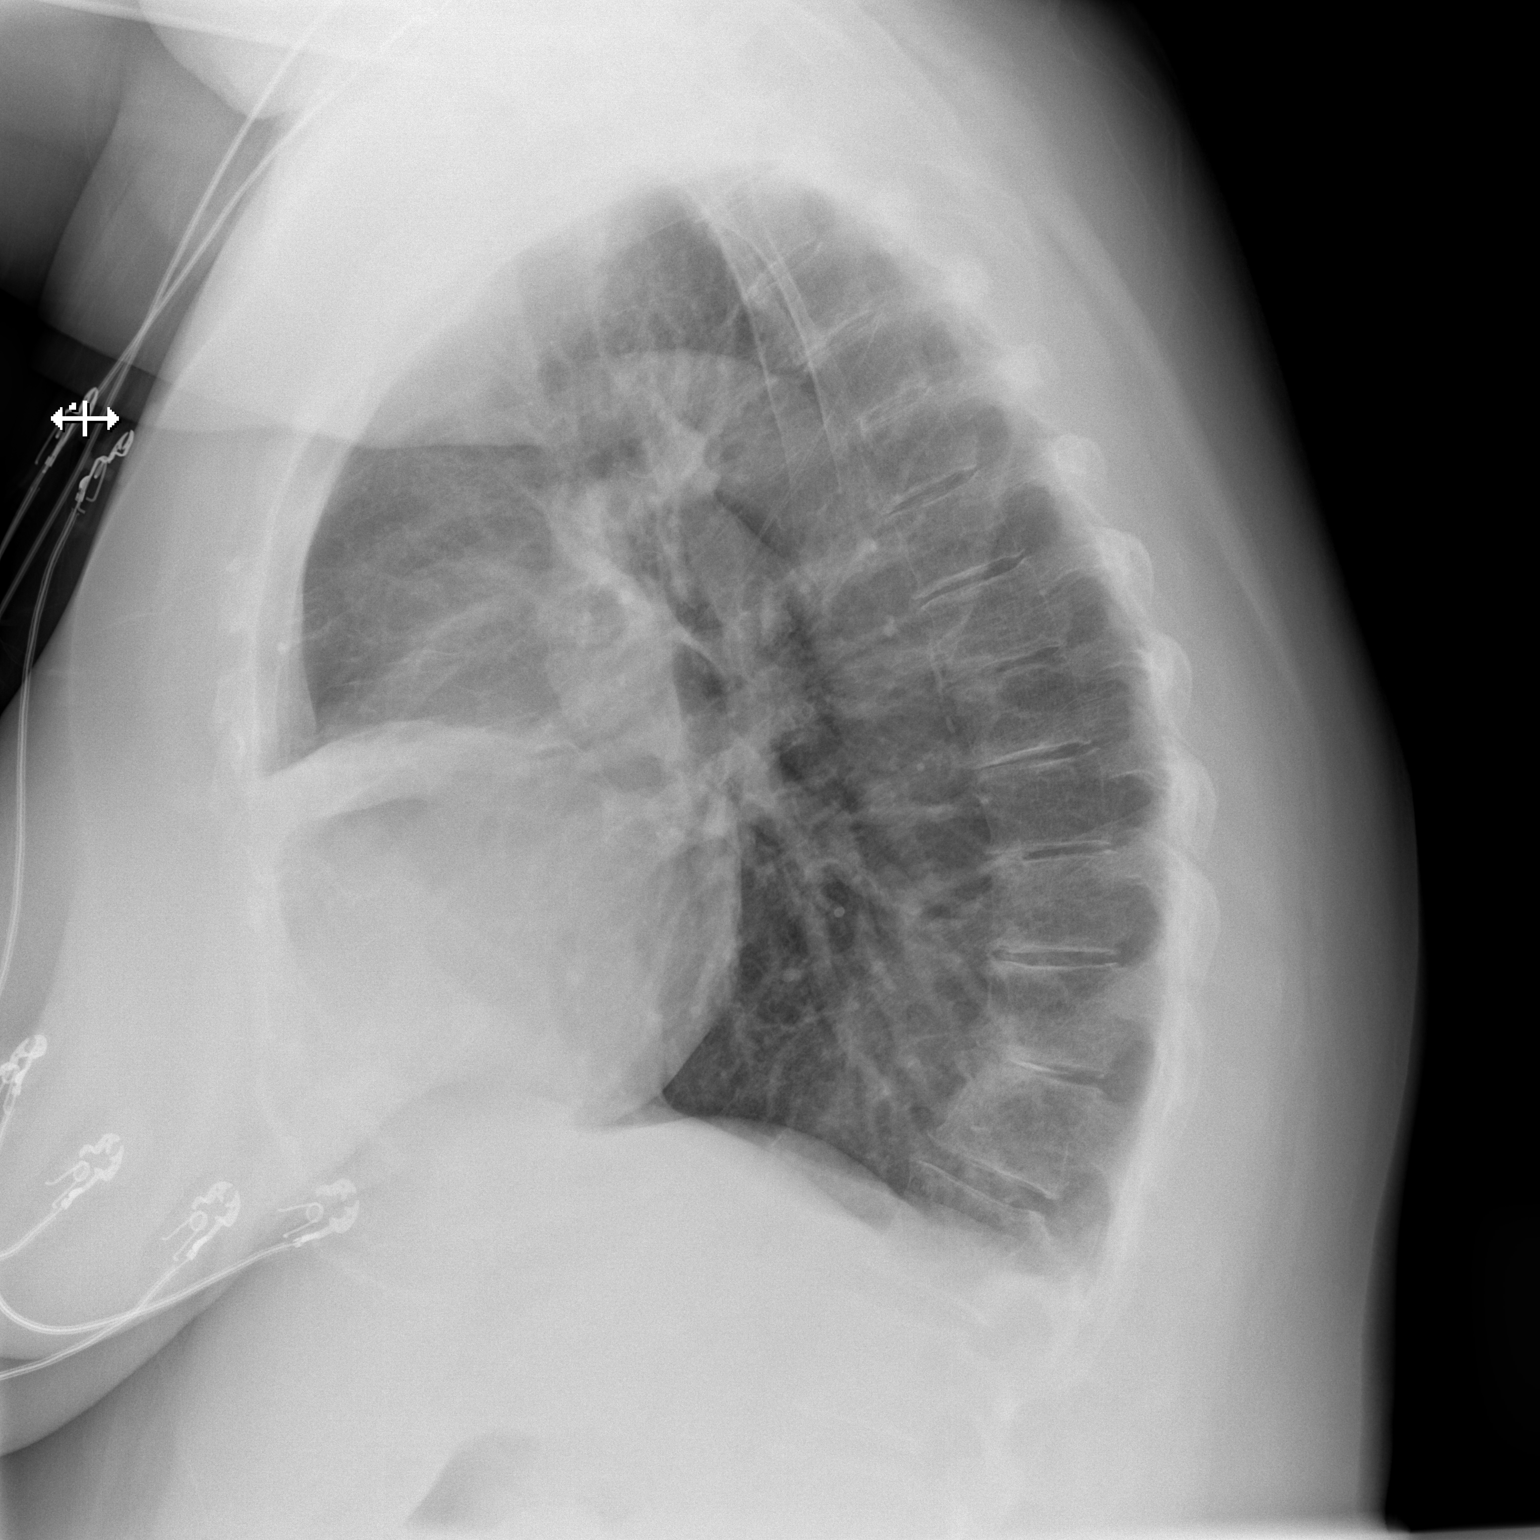

[2 of 2 positions shown; findings below may reference images not displayed]

FINDINGS: The lungs are emphysematous. Focal airspace disease is identified in
the right upper lobe. The left lung is clear. No pneumothorax or
pleural effusion. Heart size is normal. Aortic atherosclerosis is
noted. No focal bony abnormality.
IMPRESSION: Focal right upper lobe airspace disease worrisome for pneumonia.
Recommend followup films to clearing.

Emphysema.

## 2018-08-11 IMAGING — DX DG CHEST 1V PORT
1 series · 1 of 1 positions shown · non-contrast
Comparison: May 23, 2026

CLINICAL DATA: Dyspnea and cough for 1 week.

EXAM:
PORTABLE CHEST 1 VIEW

[chest ap]
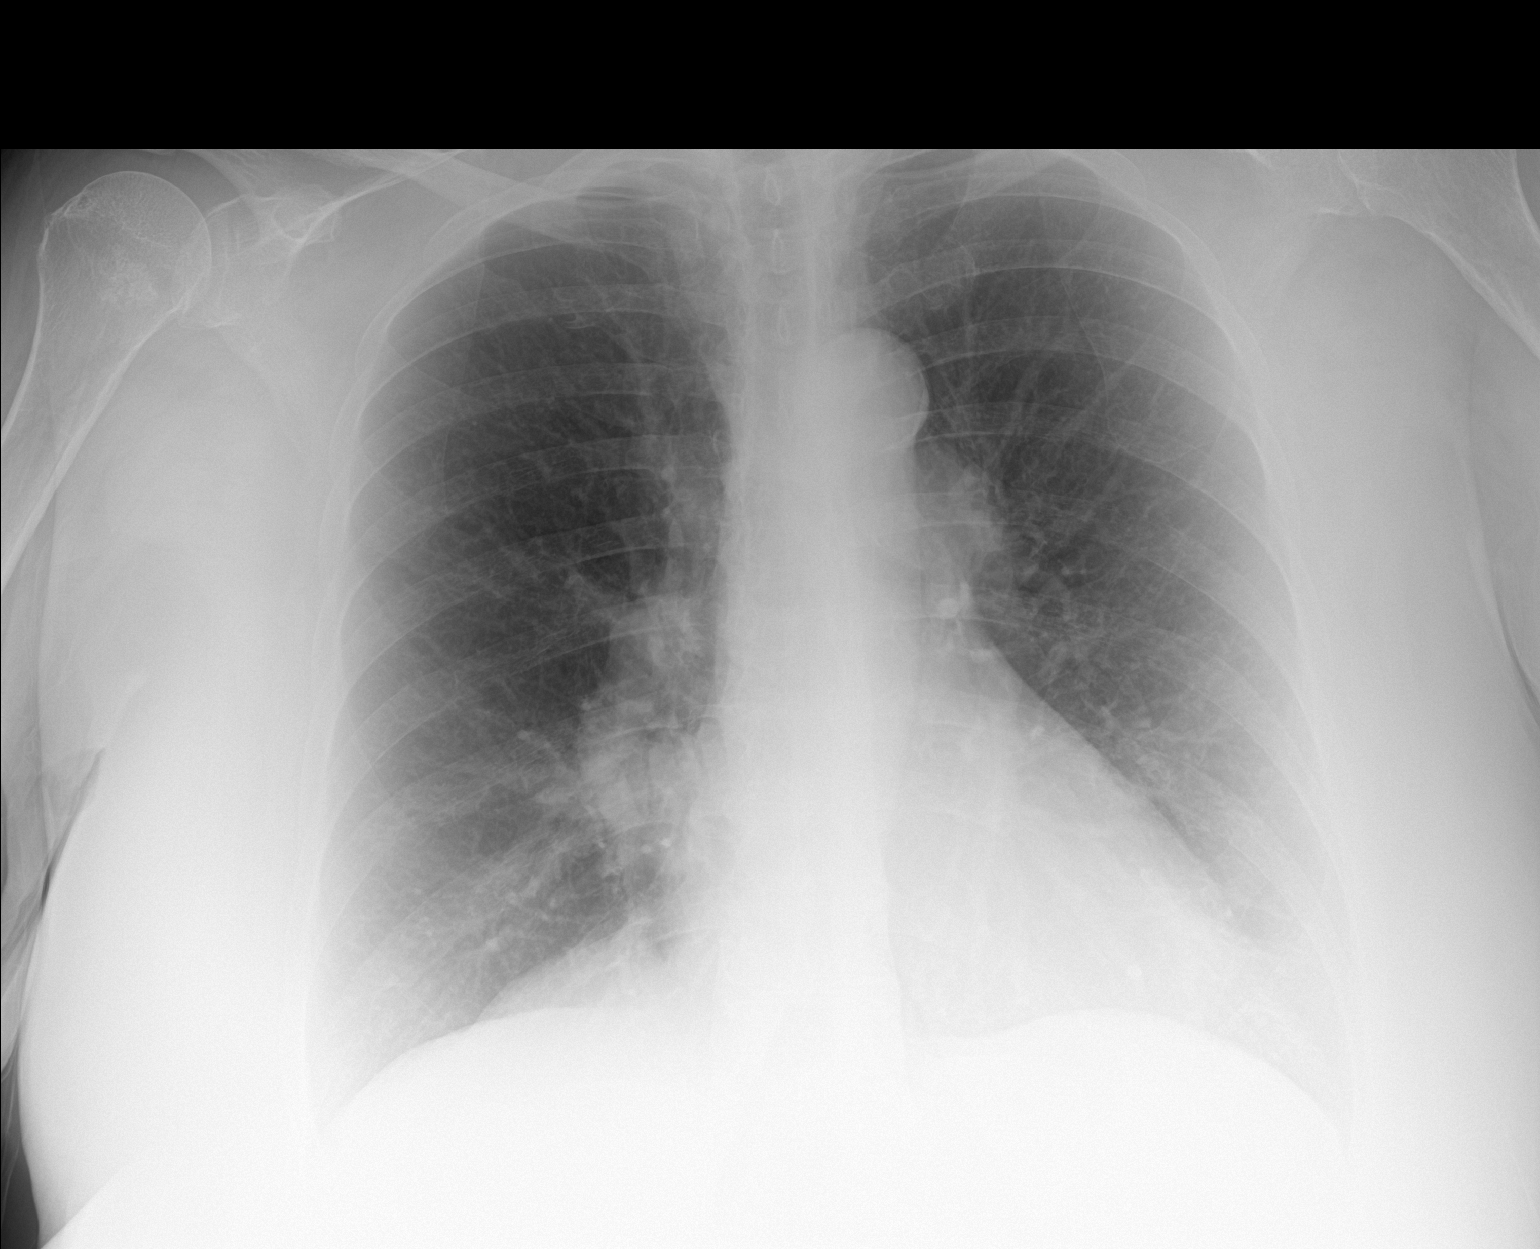

[1 of 1 positions shown; findings below may reference images not displayed]

FINDINGS: The heart size and mediastinal contours are stable. There is no
focal infiltrate, pulmonary edema, or pleural effusion. Prominent
central pulmonary arteries are identified unchanged. The visualized
skeletal structures are stable.
IMPRESSION: No active cardiopulmonary disease.

## 2018-09-09 DIAGNOSIS — J441 Chronic obstructive pulmonary disease with (acute) exacerbation: Secondary | ICD-10-CM | POA: Diagnosis not present

## 2018-09-24 ENCOUNTER — Other Ambulatory Visit: Payer: Self-pay | Admitting: Internal Medicine

## 2018-10-10 DIAGNOSIS — J441 Chronic obstructive pulmonary disease with (acute) exacerbation: Secondary | ICD-10-CM | POA: Diagnosis not present

## 2018-11-08 DIAGNOSIS — J441 Chronic obstructive pulmonary disease with (acute) exacerbation: Secondary | ICD-10-CM | POA: Diagnosis not present

## 2018-11-08 DIAGNOSIS — Z96651 Presence of right artificial knee joint: Secondary | ICD-10-CM | POA: Diagnosis not present

## 2018-11-08 DIAGNOSIS — J188 Other pneumonia, unspecified organism: Secondary | ICD-10-CM | POA: Diagnosis not present

## 2018-11-08 DIAGNOSIS — M1990 Unspecified osteoarthritis, unspecified site: Secondary | ICD-10-CM | POA: Diagnosis not present

## 2018-12-07 ENCOUNTER — Other Ambulatory Visit: Payer: Self-pay | Admitting: Internal Medicine

## 2018-12-09 DIAGNOSIS — J188 Other pneumonia, unspecified organism: Secondary | ICD-10-CM | POA: Diagnosis not present

## 2018-12-09 DIAGNOSIS — M1990 Unspecified osteoarthritis, unspecified site: Secondary | ICD-10-CM | POA: Diagnosis not present

## 2018-12-09 DIAGNOSIS — Z96651 Presence of right artificial knee joint: Secondary | ICD-10-CM | POA: Diagnosis not present

## 2018-12-09 DIAGNOSIS — J441 Chronic obstructive pulmonary disease with (acute) exacerbation: Secondary | ICD-10-CM | POA: Diagnosis not present

## 2018-12-30 ENCOUNTER — Telehealth: Payer: Self-pay | Admitting: Internal Medicine

## 2018-12-30 ENCOUNTER — Other Ambulatory Visit: Payer: Self-pay

## 2018-12-30 DIAGNOSIS — I10 Essential (primary) hypertension: Secondary | ICD-10-CM

## 2018-12-30 MED ORDER — AMLODIPINE BESYLATE 10 MG PO TABS: 10.0000 mg | ORAL_TABLET | Freq: Every day | ORAL | 0 refills | Status: DC

## 2018-12-30 NOTE — Telephone Encounter (Signed)
Patient needs to schedule 1 year follow up with Dr. Okey Dupre.  She is not able to schedule a Virtual Visit.  She would like to know if she can come into the office?  She is in need of her blood pressure medication.

## 2018-12-30 NOTE — Telephone Encounter (Signed)
Refill sent will call back to schedule appointment. Informed to schedule before the amlodipine runs out patient stated understanding

## 2018-12-30 NOTE — Telephone Encounter (Signed)
Not safe to come in, please fill meds and schedule out visit

## 2018-12-30 NOTE — Telephone Encounter (Signed)
Are you okay with patient coming in or do you want to refill BP medication and schedule a visit for a couple months out?

## 2019-01-08 DIAGNOSIS — M1990 Unspecified osteoarthritis, unspecified site: Secondary | ICD-10-CM | POA: Diagnosis not present

## 2019-01-08 DIAGNOSIS — J188 Other pneumonia, unspecified organism: Secondary | ICD-10-CM | POA: Diagnosis not present

## 2019-01-08 DIAGNOSIS — Z96651 Presence of right artificial knee joint: Secondary | ICD-10-CM | POA: Diagnosis not present

## 2019-01-08 DIAGNOSIS — J441 Chronic obstructive pulmonary disease with (acute) exacerbation: Secondary | ICD-10-CM | POA: Diagnosis not present

## 2019-02-08 DIAGNOSIS — M1990 Unspecified osteoarthritis, unspecified site: Secondary | ICD-10-CM | POA: Diagnosis not present

## 2019-02-08 DIAGNOSIS — J188 Other pneumonia, unspecified organism: Secondary | ICD-10-CM | POA: Diagnosis not present

## 2019-02-08 DIAGNOSIS — Z96651 Presence of right artificial knee joint: Secondary | ICD-10-CM | POA: Diagnosis not present

## 2019-02-08 DIAGNOSIS — J441 Chronic obstructive pulmonary disease with (acute) exacerbation: Secondary | ICD-10-CM | POA: Diagnosis not present

## 2019-03-09 ENCOUNTER — Encounter: Payer: Self-pay | Admitting: Internal Medicine

## 2019-03-09 ENCOUNTER — Ambulatory Visit (INDEPENDENT_AMBULATORY_CARE_PROVIDER_SITE_OTHER): Payer: Medicare Other | Admitting: Internal Medicine

## 2019-03-09 ENCOUNTER — Other Ambulatory Visit: Payer: Self-pay

## 2019-03-09 ENCOUNTER — Other Ambulatory Visit (INDEPENDENT_AMBULATORY_CARE_PROVIDER_SITE_OTHER): Payer: Medicare Other

## 2019-03-09 VITALS — BP 138/90 | HR 68 | Temp 98.1°F | Ht 62.5 in | Wt 181.0 lb

## 2019-03-09 DIAGNOSIS — J449 Chronic obstructive pulmonary disease, unspecified: Secondary | ICD-10-CM | POA: Diagnosis not present

## 2019-03-09 DIAGNOSIS — E785 Hyperlipidemia, unspecified: Secondary | ICD-10-CM

## 2019-03-09 DIAGNOSIS — E099 Drug or chemical induced diabetes mellitus without complications: Secondary | ICD-10-CM

## 2019-03-09 DIAGNOSIS — T380X5A Adverse effect of glucocorticoids and synthetic analogues, initial encounter: Secondary | ICD-10-CM | POA: Diagnosis not present

## 2019-03-09 DIAGNOSIS — Z87891 Personal history of nicotine dependence: Secondary | ICD-10-CM

## 2019-03-09 DIAGNOSIS — R739 Hyperglycemia, unspecified: Secondary | ICD-10-CM

## 2019-03-09 DIAGNOSIS — I10 Essential (primary) hypertension: Secondary | ICD-10-CM

## 2019-03-09 DIAGNOSIS — J9611 Chronic respiratory failure with hypoxia: Secondary | ICD-10-CM | POA: Diagnosis not present

## 2019-03-09 DIAGNOSIS — I5032 Chronic diastolic (congestive) heart failure: Secondary | ICD-10-CM

## 2019-03-09 DIAGNOSIS — Z0000 Encounter for general adult medical examination without abnormal findings: Secondary | ICD-10-CM

## 2019-03-09 DIAGNOSIS — J4489 Other specified chronic obstructive pulmonary disease: Secondary | ICD-10-CM

## 2019-03-09 DIAGNOSIS — J9601 Acute respiratory failure with hypoxia: Secondary | ICD-10-CM

## 2019-03-09 DIAGNOSIS — E1169 Type 2 diabetes mellitus with other specified complication: Secondary | ICD-10-CM

## 2019-03-09 LAB — LIPID PANEL
Cholesterol: 158 mg/dL (ref 0–200)
HDL: 40.6 mg/dL (ref >39.00)
LDL Cholesterol: 97 mg/dL (ref 0–99)
NonHDL: 117.6
Total CHOL/HDL Ratio: 4
Triglycerides: 103 mg/dL (ref 0.0–149.0)
VLDL: 20.6 mg/dL (ref 0.0–40.0)

## 2019-03-09 LAB — CBC
HCT: 40.4 % (ref 36.0–46.0)
Hemoglobin: 13.5 g/dL (ref 12.0–15.0)
MCHC: 33.4 g/dL (ref 30.0–36.0)
MCV: 92.8 fl (ref 78.0–100.0)
Platelets: 243 10*3/uL (ref 150.0–400.0)
RBC: 4.35 Mil/uL (ref 3.87–5.11)
RDW: 12.6 % (ref 11.5–15.5)
WBC: 5.4 10*3/uL (ref 4.0–10.5)

## 2019-03-09 LAB — COMPREHENSIVE METABOLIC PANEL
ALT: 16 U/L (ref 0–35)
AST: 14 U/L (ref 0–37)
Albumin: 4.3 g/dL (ref 3.5–5.2)
Alkaline Phosphatase: 71 U/L (ref 39–117)
BUN: 9 mg/dL (ref 6–23)
CO2: 33 mEq/L — ABNORMAL HIGH (ref 19–32)
Calcium: 9.9 mg/dL (ref 8.4–10.5)
Chloride: 101 mEq/L (ref 96–112)
Creatinine, Ser: 0.52 mg/dL (ref 0.40–1.20)
GFR: 116.9 mL/min (ref >60.00)
Glucose, Bld: 201 mg/dL — ABNORMAL HIGH (ref 70–99)
Potassium: 4.1 mEq/L (ref 3.5–5.1)
Sodium: 144 mEq/L (ref 135–145)
Total Bilirubin: 0.4 mg/dL (ref 0.2–1.2)
Total Protein: 6.8 g/dL (ref 6.0–8.3)

## 2019-03-09 LAB — HEMOGLOBIN A1C: Hgb A1c MFr Bld: 7.9 % — ABNORMAL HIGH (ref 4.6–6.5)

## 2019-03-09 MED ORDER — ALBUTEROL SULFATE HFA 108 (90 BASE) MCG/ACT IN AERS: 2.0000 | INHALATION_SPRAY | RESPIRATORY_TRACT | 3 refills | Status: DC | PRN

## 2019-03-09 MED ORDER — MONTELUKAST SODIUM 10 MG PO TABS: 10.0000 mg | ORAL_TABLET | Freq: Every day | ORAL | 2 refills | Status: DC

## 2019-03-09 MED ORDER — ALBUTEROL SULFATE (2.5 MG/3ML) 0.083% IN NEBU: 2.5000 mg | INHALATION_SOLUTION | Freq: Three times a day (TID) | RESPIRATORY_TRACT | 5 refills | Status: DC

## 2019-03-09 MED ORDER — AMLODIPINE BESYLATE 10 MG PO TABS: 10.0000 mg | ORAL_TABLET | Freq: Every day | ORAL | 3 refills | Status: DC

## 2019-03-09 MED ORDER — ATORVASTATIN CALCIUM 40 MG PO TABS: 40.0000 mg | ORAL_TABLET | Freq: Every day | ORAL | 3 refills | Status: DC

## 2019-03-09 MED ORDER — INSULIN DETEMIR 100 UNIT/ML FLEXPEN: 20.0000 [IU] | PEN_INJECTOR | Freq: Every day | SUBCUTANEOUS | 11 refills | Status: DC

## 2019-03-09 MED ORDER — FLUTICASONE PROPIONATE 50 MCG/ACT NA SUSP: 2.0000 | Freq: Every day | NASAL | 3 refills | Status: DC

## 2019-03-09 NOTE — Assessment & Plan Note (Signed)
O2 with activity, O2 with short walking distance down to 87%. With O2 up to 90% and appropriate to continue chronic oxygen.

## 2019-03-09 NOTE — Assessment & Plan Note (Signed)
Still not smoking and reminded to continue no smoking for life.

## 2019-03-09 NOTE — Assessment & Plan Note (Signed)
Stable, seeing pulmonary on maximum therapy. She is not smoking and reminded never to start again.

## 2019-03-09 NOTE — Assessment & Plan Note (Signed)
Checking HgA1c, on levemir 20 units nightly. Foot exam done. Reminded about eye exam. Checking microalbumin to creatinine ratio. Not on ACE-I/ARB and on statin.
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
## 2019-03-09 NOTE — Assessment & Plan Note (Signed)
Weight stable and no diuretics at this time.

## 2019-03-09 NOTE — Assessment & Plan Note (Signed)
Checking lipid panel and adjust lipitor 40 mg daily if needed.  

## 2019-03-09 NOTE — Assessment & Plan Note (Signed)
Flu shot reminded. Pneumonia complete. Shingrix counseled. Tetanus due 2029. Colonoscopy due, declines. Mammogram due declines, pap smear aged out and dexa due declines. Counseled about sun safety and mole surveillance. Counseled about the dangers of distracted driving. Given 10 year screening recommendations.

## 2019-03-09 NOTE — Patient Instructions (Signed)
Health Maintenance, Female Adopting a healthy lifestyle and getting preventive care are important in promoting health and wellness. Ask your health care provider about:  The right schedule for you to have regular tests and exams.  Things you can do on your own to prevent diseases and keep yourself healthy. What should I know about diet, weight, and exercise? Eat a healthy diet   Eat a diet that includes plenty of vegetables, fruits, low-fat dairy products, and lean protein.  Do not eat a lot of foods that are high in solid fats, added sugars, or sodium. Maintain a healthy weight Body mass index (BMI) is used to identify weight problems. It estimates body fat based on height and weight. Your health care provider can help determine your BMI and help you achieve or maintain a healthy weight. Get regular exercise Get regular exercise. This is one of the most important things you can do for your health. Most adults should:  Exercise for at least 150 minutes each week. The exercise should increase your heart rate and make you sweat (moderate-intensity exercise).  Do strengthening exercises at least twice a week. This is in addition to the moderate-intensity exercise.  Spend less time sitting. Even light physical activity can be beneficial. Watch cholesterol and blood lipids Have your blood tested for lipids and cholesterol at 69 years of age, then have this test every 5 years. Have your cholesterol levels checked more often if:  Your lipid or cholesterol levels are high.  You are older than 69 years of age.  You are at high risk for heart disease. What should I know about cancer screening? Depending on your health history and family history, you may need to have cancer screening at various ages. This may include screening for:  Breast cancer.  Cervical cancer.  Colorectal cancer.  Skin cancer.  Lung cancer. What should I know about heart disease, diabetes, and high blood  pressure? Blood pressure and heart disease  High blood pressure causes heart disease and increases the risk of stroke. This is more likely to develop in people who have high blood pressure readings, are of African descent, or are overweight.  Have your blood pressure checked: ? Every 3-5 years if you are 18-39 years of age. ? Every year if you are 40 years old or older. Diabetes Have regular diabetes screenings. This checks your fasting blood sugar level. Have the screening done:  Once every three years after age 40 if you are at a normal weight and have a low risk for diabetes.  More often and at a younger age if you are overweight or have a high risk for diabetes. What should I know about preventing infection? Hepatitis B If you have a higher risk for hepatitis B, you should be screened for this virus. Talk with your health care provider to find out if you are at risk for hepatitis B infection. Hepatitis C Testing is recommended for:  Everyone born from 1945 through 1965.  Anyone with known risk factors for hepatitis C. Sexually transmitted infections (STIs)  Get screened for STIs, including gonorrhea and chlamydia, if: ? You are sexually active and are younger than 69 years of age. ? You are older than 69 years of age and your health care provider tells you that you are at risk for this type of infection. ? Your sexual activity has changed since you were last screened, and you are at increased risk for chlamydia or gonorrhea. Ask your health care provider if   you are at risk.  Ask your health care provider about whether you are at high risk for HIV. Your health care provider may recommend a prescription medicine to help prevent HIV infection. If you choose to take medicine to prevent HIV, you should first get tested for HIV. You should then be tested every 3 months for as long as you are taking the medicine. Pregnancy  If you are about to stop having your period (premenopausal) and  you may become pregnant, seek counseling before you get pregnant.  Take 400 to 800 micrograms (mcg) of folic acid every day if you become pregnant.  Ask for birth control (contraception) if you want to prevent pregnancy. Osteoporosis and menopause Osteoporosis is a disease in which the bones lose minerals and strength with aging. This can result in bone fractures. If you are 65 years old or older, or if you are at risk for osteoporosis and fractures, ask your health care provider if you should:  Be screened for bone loss.  Take a calcium or vitamin D supplement to lower your risk of fractures.  Be given hormone replacement therapy (HRT) to treat symptoms of menopause. Follow these instructions at home: Lifestyle  Do not use any products that contain nicotine or tobacco, such as cigarettes, e-cigarettes, and chewing tobacco. If you need help quitting, ask your health care provider.  Do not use street drugs.  Do not share needles.  Ask your health care provider for help if you need support or information about quitting drugs. Alcohol use  Do not drink alcohol if: ? Your health care provider tells you not to drink. ? You are pregnant, may be pregnant, or are planning to become pregnant.  If you drink alcohol: ? Limit how much you use to 0-1 drink a day. ? Limit intake if you are breastfeeding.  Be aware of how much alcohol is in your drink. In the [place] one drink equals one 12 oz bottle of beer (355 mL), one 5 oz glass of wine (148 mL), or one 1 oz glass of hard liquor (44 mL). General instructions  Schedule regular health, dental, and eye exams.  Stay current with your vaccines.  Tell your health care provider if: ? You often feel depressed. ? You have ever been abused or do not feel safe at home. Summary  Adopting a healthy lifestyle and getting preventive care are important in promoting health and wellness.  Follow your health care provider's instructions about healthy  diet, exercising, and getting tested or screened for diseases.  Follow your health care provider's instructions on monitoring your cholesterol and blood pressure. This information is not intended to replace advice given to you by your health care provider. Make sure you discuss any questions you have with your health care provider. Document Released: 03/03/2011 Document Revised: 08/11/2018 Document Reviewed: 08/11/2018 Elsevier Patient Education  2020 Elsevier Inc.  

## 2019-03-09 NOTE — Assessment & Plan Note (Signed)
BP at goal on amlodipine. Checking CMP and adjust as needed.  

## 2019-03-10 DIAGNOSIS — M1990 Unspecified osteoarthritis, unspecified site: Secondary | ICD-10-CM | POA: Diagnosis not present

## 2019-03-10 DIAGNOSIS — J188 Other pneumonia, unspecified organism: Secondary | ICD-10-CM | POA: Diagnosis not present

## 2019-03-10 DIAGNOSIS — Z96651 Presence of right artificial knee joint: Secondary | ICD-10-CM | POA: Diagnosis not present

## 2019-03-10 DIAGNOSIS — J441 Chronic obstructive pulmonary disease with (acute) exacerbation: Secondary | ICD-10-CM | POA: Diagnosis not present

## 2019-04-10 DIAGNOSIS — Z96651 Presence of right artificial knee joint: Secondary | ICD-10-CM | POA: Diagnosis not present

## 2019-04-10 DIAGNOSIS — J188 Other pneumonia, unspecified organism: Secondary | ICD-10-CM | POA: Diagnosis not present

## 2019-04-10 DIAGNOSIS — J441 Chronic obstructive pulmonary disease with (acute) exacerbation: Secondary | ICD-10-CM | POA: Diagnosis not present

## 2019-04-10 DIAGNOSIS — M1990 Unspecified osteoarthritis, unspecified site: Secondary | ICD-10-CM | POA: Diagnosis not present

## 2019-05-11 DIAGNOSIS — M1990 Unspecified osteoarthritis, unspecified site: Secondary | ICD-10-CM | POA: Diagnosis not present

## 2019-05-11 DIAGNOSIS — Z96651 Presence of right artificial knee joint: Secondary | ICD-10-CM | POA: Diagnosis not present

## 2019-05-11 DIAGNOSIS — J188 Other pneumonia, unspecified organism: Secondary | ICD-10-CM | POA: Diagnosis not present

## 2019-05-11 DIAGNOSIS — J441 Chronic obstructive pulmonary disease with (acute) exacerbation: Secondary | ICD-10-CM | POA: Diagnosis not present

## 2019-06-10 DIAGNOSIS — M1990 Unspecified osteoarthritis, unspecified site: Secondary | ICD-10-CM | POA: Diagnosis not present

## 2019-06-10 DIAGNOSIS — J188 Other pneumonia, unspecified organism: Secondary | ICD-10-CM | POA: Diagnosis not present

## 2019-06-10 DIAGNOSIS — J441 Chronic obstructive pulmonary disease with (acute) exacerbation: Secondary | ICD-10-CM | POA: Diagnosis not present

## 2019-06-10 DIAGNOSIS — Z96651 Presence of right artificial knee joint: Secondary | ICD-10-CM | POA: Diagnosis not present

## 2019-06-24 ENCOUNTER — Other Ambulatory Visit: Payer: Self-pay | Admitting: Internal Medicine

## 2019-06-29 IMAGING — CR DG CHEST 2V
2 series · 2 of 2 positions shown · non-contrast
Comparison: Chest radiograph May 26, 2016

CLINICAL DATA: Shortness of breath for 3 days.

EXAM:
CHEST  2 VIEW

[w chest pa]
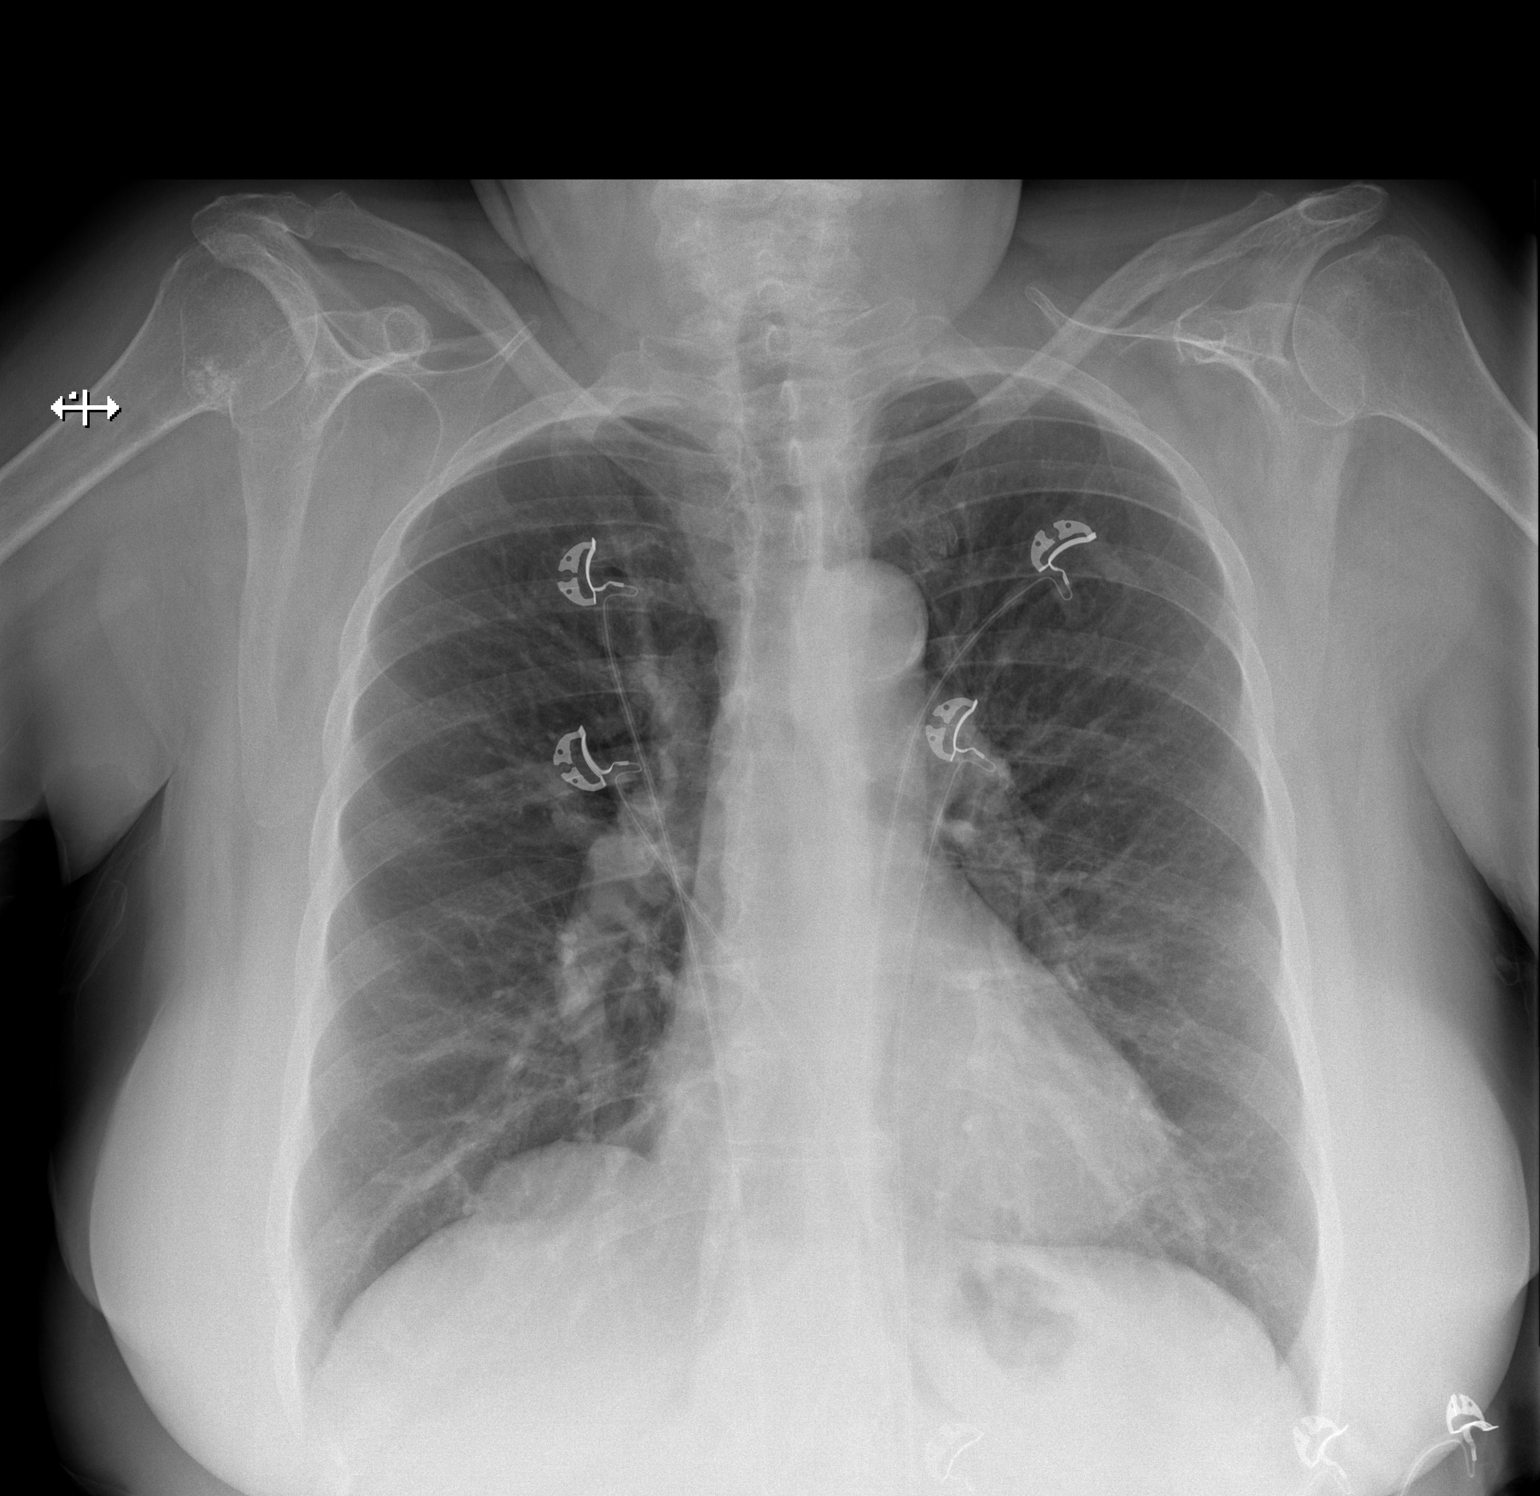

[w chest lat]
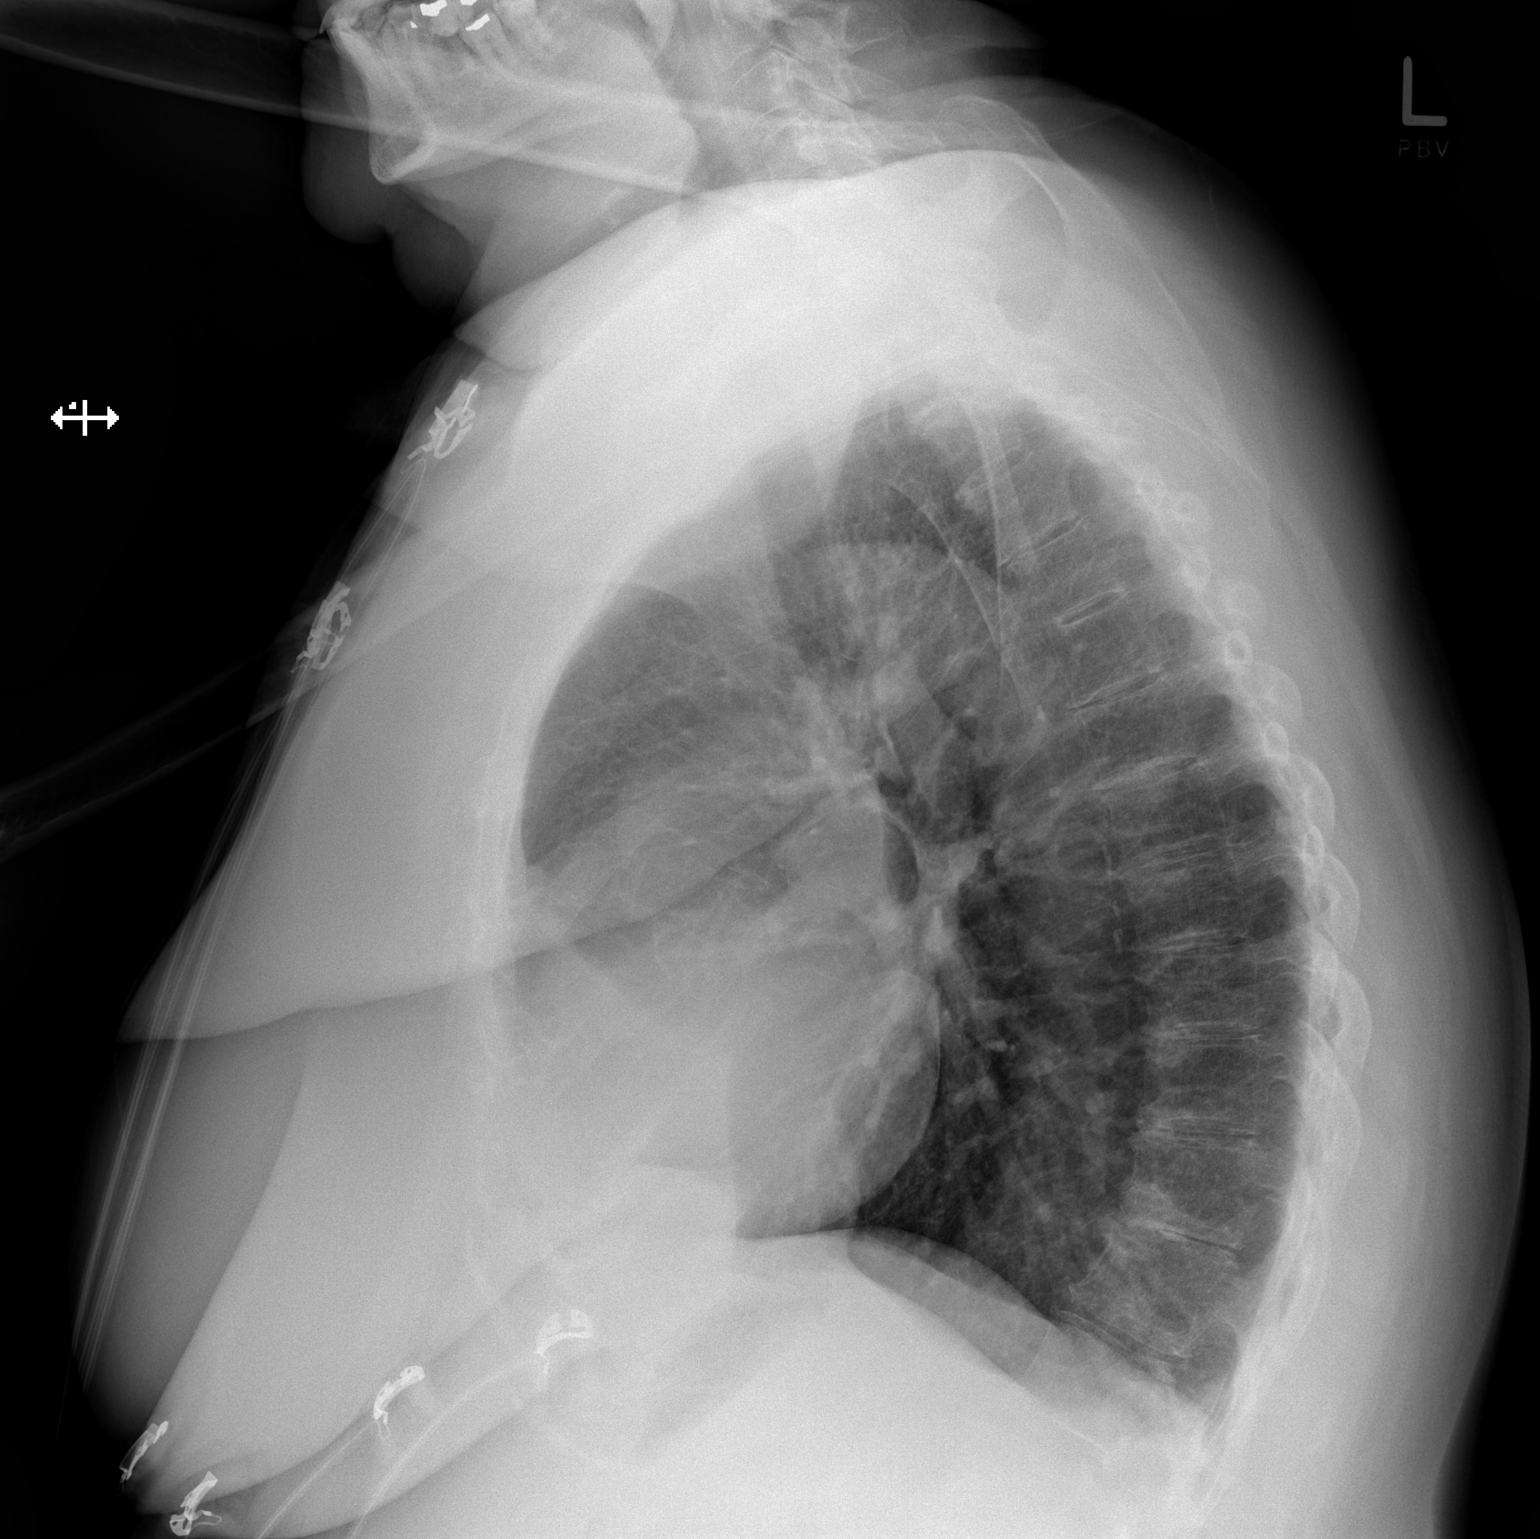

[2 of 2 positions shown; findings below may reference images not displayed]

FINDINGS: Cardiac silhouette is upper limits of normal in size, mediastinal
silhouette is nonsuspicious. Calcified aortic knob. No pleural
effusions or focal consolidations. Pulmonary vasculature appears
normal. Trachea projects midline and there is no pneumothorax. Soft
tissue planes and included osseous structures are non-suspicious.
Mild kyphosis. Similar focal sclerosis RIGHT humeral head seen with
enchondroma.
IMPRESSION: Borderline cardiomegaly.  No acute pulmonary process.

Aortic Atherosclerosis (LUIR7-DXC.C).

## 2019-07-11 DIAGNOSIS — J441 Chronic obstructive pulmonary disease with (acute) exacerbation: Secondary | ICD-10-CM | POA: Diagnosis not present

## 2019-07-11 DIAGNOSIS — J188 Other pneumonia, unspecified organism: Secondary | ICD-10-CM | POA: Diagnosis not present

## 2019-07-11 DIAGNOSIS — Z96651 Presence of right artificial knee joint: Secondary | ICD-10-CM | POA: Diagnosis not present

## 2019-07-11 DIAGNOSIS — M1990 Unspecified osteoarthritis, unspecified site: Secondary | ICD-10-CM | POA: Diagnosis not present

## 2019-08-10 DIAGNOSIS — J441 Chronic obstructive pulmonary disease with (acute) exacerbation: Secondary | ICD-10-CM | POA: Diagnosis not present

## 2019-08-10 DIAGNOSIS — J188 Other pneumonia, unspecified organism: Secondary | ICD-10-CM | POA: Diagnosis not present

## 2019-08-10 DIAGNOSIS — M1990 Unspecified osteoarthritis, unspecified site: Secondary | ICD-10-CM | POA: Diagnosis not present

## 2019-08-10 DIAGNOSIS — Z96651 Presence of right artificial knee joint: Secondary | ICD-10-CM | POA: Diagnosis not present

## 2019-09-05 ENCOUNTER — Ambulatory Visit: Payer: Medicare Other | Admitting: Internal Medicine

## 2019-09-08 ENCOUNTER — Other Ambulatory Visit: Payer: Self-pay

## 2019-09-08 ENCOUNTER — Ambulatory Visit (INDEPENDENT_AMBULATORY_CARE_PROVIDER_SITE_OTHER): Payer: Medicare Other | Admitting: Internal Medicine

## 2019-09-08 ENCOUNTER — Encounter: Payer: Self-pay | Admitting: Internal Medicine

## 2019-09-08 VITALS — BP 162/88 | HR 88 | Temp 98.0°F | Ht 62.5 in | Wt 183.0 lb

## 2019-09-08 DIAGNOSIS — J9611 Chronic respiratory failure with hypoxia: Secondary | ICD-10-CM | POA: Diagnosis not present

## 2019-09-08 DIAGNOSIS — J449 Chronic obstructive pulmonary disease, unspecified: Secondary | ICD-10-CM

## 2019-09-08 DIAGNOSIS — Z23 Encounter for immunization: Secondary | ICD-10-CM | POA: Diagnosis not present

## 2019-09-08 DIAGNOSIS — E099 Drug or chemical induced diabetes mellitus without complications: Secondary | ICD-10-CM | POA: Diagnosis not present

## 2019-09-08 DIAGNOSIS — T380X5D Adverse effect of glucocorticoids and synthetic analogues, subsequent encounter: Secondary | ICD-10-CM | POA: Diagnosis not present

## 2019-09-08 DIAGNOSIS — J9601 Acute respiratory failure with hypoxia: Secondary | ICD-10-CM | POA: Diagnosis not present

## 2019-09-08 LAB — POCT GLYCOSYLATED HEMOGLOBIN (HGB A1C): Hemoglobin A1C: 7.1 % — AB (ref 4.0–5.6)

## 2019-09-08 MED ORDER — FLUTICASONE PROPIONATE 50 MCG/ACT NA SUSP: 2.0000 | Freq: Every day | NASAL | 3 refills | Status: DC

## 2019-09-08 MED ORDER — BLOOD GLUCOSE METER KIT: PACK | 0 refills | Status: DC

## 2019-09-08 MED ORDER — ALBUTEROL SULFATE HFA 108 (90 BASE) MCG/ACT IN AERS: 2.0000 | INHALATION_SPRAY | RESPIRATORY_TRACT | 3 refills | Status: DC | PRN

## 2019-09-08 MED ORDER — ANORO ELLIPTA 62.5-25 MCG/INH IN AEPB: 1.0000 | INHALATION_SPRAY | Freq: Every day | RESPIRATORY_TRACT | 3 refills | Status: DC

## 2019-09-08 NOTE — Progress Notes (Signed)
   Subjective:   Patient ID: Yvonne Todd, female    DOB: 1951/05/18, 69 y.o.   MRN: [id number]  HPI The patient is a 69 YO female coming in for follow up diabetes. Triggered by repeated courses of prednisone from COPD. She does take levemir 20 units qhs. Not monitoring sugars currently. Rare low sugar once a month or less.   Review of Systems  Constitutional: Negative.   HENT: Negative.   Eyes: Negative.   Respiratory: Negative for cough, chest tightness and shortness of breath.   Cardiovascular: Negative for chest pain, palpitations and leg swelling.  Gastrointestinal: Negative for abdominal distention, abdominal pain, constipation, diarrhea, nausea and vomiting.  Musculoskeletal: Negative.   Skin: Negative.   Neurological: Negative.   Psychiatric/Behavioral: Negative.     Objective:  Physical Exam Constitutional:      Appearance: She is well-developed.  HENT:     Head: Normocephalic and atraumatic.  Cardiovascular:     Rate and Rhythm: Normal rate and regular rhythm.  Pulmonary:     Effort: Pulmonary effort is normal. No respiratory distress.     Breath sounds: Normal breath sounds. No wheezing or rales.  Abdominal:     General: Bowel sounds are normal. There is no distension.     Palpations: Abdomen is soft.     Tenderness: There is no abdominal tenderness. There is no rebound.  Musculoskeletal:     Cervical back: Normal range of motion.  Skin:    General: Skin is warm and dry.  Neurological:     Mental Status: She is alert and oriented to person, place, and time.     Coordination: Coordination normal.     Vitals:   09/08/19 1301  BP: (!) 162/88  Pulse: 88  Temp: 98 F (36.7 C)  TempSrc: Oral  SpO2: 92%  Weight: 183 lb (83 kg)  Height: 5' 2.5" (1.588 m)    This visit occurred during the SARS-CoV-2 public health emergency.  Safety protocols were in place, including screening questions prior to the visit, additional usage of staff PPE, and extensive cleaning  of exam room while observing appropriate contact time as indicated for disinfecting solutions.   Assessment & Plan:  Pneumonia 23 given at visit

## 2019-09-08 NOTE — Patient Instructions (Signed)
Diabetes Mellitus and Standards of Medical Care Managing diabetes (diabetes mellitus) can be complicated. Your diabetes treatment may be managed by a team of health care providers, including:  A physician who specializes in diabetes (endocrinologist).  A nurse practitioner or physician assistant.  Nurses.  A diet and nutrition specialist (registered dietitian).  A certified diabetes educator (CDE).  An exercise specialist.  A pharmacist.  An eye doctor.  A foot specialist (podiatrist).  A dentist.  A primary care provider.  A mental health provider. Your health care providers follow guidelines to help you get the best quality of care. The following schedule is a general guideline for your diabetes management plan. Your health care providers may give you more specific instructions. Physical exams Upon being diagnosed with diabetes mellitus, and each year after that, your health care provider will ask about your medical and family history. He or she will also do a physical exam. Your exam may include:  Measuring your height, weight, and body mass index (BMI).  Checking your blood pressure. This will be done at every routine medical visit. Your target blood pressure may vary depending on your medical conditions, your age, and other factors.  Thyroid gland exam.  Skin exam.  Screening for damage to your nerves (peripheral neuropathy). This may include checking the pulse in your legs and feet and checking the level of sensation in your hands and feet.  A complete foot exam to inspect the structure and skin of your feet, including checking for cuts, bruises, redness, blisters, sores, or other problems.  Screening for blood vessel (vascular) problems, which may include checking the pulse in your legs and feet and checking your temperature. Blood tests Depending on your treatment plan and your personal needs, you may have the following tests done:  HbA1c (hemoglobin A1c). This  test provides information about blood sugar (glucose) control over the previous 2-3 months. It is used to adjust your treatment plan, if needed. This test will be done: ? At least 2 times a year, if you are meeting your treatment goals. ? 4 times a year, if you are not meeting your treatment goals or if treatment goals have changed.  Lipid testing, including total, LDL, and HDL cholesterol and triglyceride levels. ? The goal for LDL is less than 100 mg/dL (5.5 mmol/L). If you are at high risk for complications, the goal is less than 70 mg/dL (3.9 mmol/L). ? The goal for HDL is 40 mg/dL (2.2 mmol/L) or higher for men and 50 mg/dL (2.8 mmol/L) or higher for women. An HDL cholesterol of 60 mg/dL (3.3 mmol/L) or higher gives some protection against heart disease. ? The goal for triglycerides is less than 150 mg/dL (8.3 mmol/L).  Liver function tests.  Kidney function tests.  Thyroid function tests. Dental and eye exams  Visit your dentist two times a year.  If you have type 1 diabetes, your health care provider may recommend an eye exam 3-5 years after you are diagnosed, and then once a year after your first exam. ? For children with type 1 diabetes, a health care provider may recommend an eye exam when your child is age 10 or older and has had diabetes for 3-5 years. After the first exam, your child should get an eye exam once a year.  If you have type 2 diabetes, your health care provider may recommend an eye exam as soon as you are diagnosed, and then once a year after your first exam. Immunizations   The   yearly flu (influenza) vaccine is recommended for everyone 6 months or older who has diabetes.  The pneumonia (pneumococcal) vaccine is recommended for everyone 2 years or older who has diabetes. If you are 65 or older, you may get the pneumonia vaccine as a series of two separate shots.  The hepatitis B vaccine is recommended for adults shortly after being diagnosed with  diabetes.  Adults and children with diabetes should receive all other vaccines according to age-specific recommendations from the Centers for Disease Control and Prevention (CDC). Mental and emotional health Screening for symptoms of eating disorders, anxiety, and depression is recommended at the time of diagnosis and afterward as needed. If your screening shows that you have symptoms (positive screening result), you may need more evaluation and you may work with a mental health care provider. Treatment plan Your treatment plan will be reviewed at every medical visit. You and your health care provider will discuss:  How you are taking your medicines, including insulin.  Any side effects you are experiencing.  Your blood glucose target goals.  The frequency of your blood glucose monitoring.  Lifestyle habits, such as activity level as well as tobacco, alcohol, and substance use. Diabetes self-management education Your health care provider will assess how well you are monitoring your blood glucose levels and whether you are taking your insulin correctly. He or she may refer you to:  A certified diabetes educator to manage your diabetes throughout your life, starting at diagnosis.  A registered dietitian who can create or review your personal nutrition plan.  An exercise specialist who can discuss your activity level and exercise plan. Summary  Managing diabetes (diabetes mellitus) can be complicated. Your diabetes treatment may be managed by a team of health care providers.  Your health care providers follow guidelines in order to help you get the best quality of care.  Standards of care including having regular physical exams, blood tests, blood pressure monitoring, immunizations, screening tests, and education about how to manage your diabetes.  Your health care providers may also give you more specific instructions based on your individual health. This information is not intended  to replace advice given to you by your health care provider. Make sure you discuss any questions you have with your health care provider. Document Revised: 05/07/2018 Document Reviewed: 05/16/2016 Elsevier Patient Education  2020 Elsevier Inc.  

## 2019-09-09 NOTE — Assessment & Plan Note (Signed)
Uses oxygen at home night time and when needed for SOB and activity.

## 2019-09-09 NOTE — Assessment & Plan Note (Signed)
HgA1c done in the office today which was 7.1 which is improved from prior. Will continue her levemir at same dosing 20 units nightly. If more low sugars she will notify and we may decrease dosing. She does monitor diet. For her avoiding steroid courses is most important for her control.

## 2019-09-09 NOTE — Assessment & Plan Note (Signed)
Uses oxygen intermittently at home when SOB. She is doing anoro currently and albuterol inhaler/nebulizer when needed. No flare currently. She has gotten rid of the cats in the home which has helped immensely. No flare today.

## 2019-10-11 DIAGNOSIS — J188 Other pneumonia, unspecified organism: Secondary | ICD-10-CM | POA: Diagnosis not present

## 2019-10-11 DIAGNOSIS — J441 Chronic obstructive pulmonary disease with (acute) exacerbation: Secondary | ICD-10-CM | POA: Diagnosis not present

## 2019-10-11 DIAGNOSIS — M1990 Unspecified osteoarthritis, unspecified site: Secondary | ICD-10-CM | POA: Diagnosis not present

## 2019-10-11 DIAGNOSIS — Z96651 Presence of right artificial knee joint: Secondary | ICD-10-CM | POA: Diagnosis not present

## 2019-10-15 ENCOUNTER — Ambulatory Visit: Payer: Medicare Other | Attending: Internal Medicine

## 2019-10-15 DIAGNOSIS — Z23 Encounter for immunization: Secondary | ICD-10-CM | POA: Insufficient documentation

## 2019-10-15 NOTE — Progress Notes (Signed)
   Covid-19 Vaccination Clinic  Name:  Yvonne Todd    MRN: [id number] DOB: March 12, 1951  10/15/2019  Yvonne Todd was observed post Covid-19 immunization for 15 minutes without incidence. She was provided with Vaccine Information Sheet and instruction to access the V-Safe system.   Yvonne Todd was instructed to call 911 with any severe reactions post vaccine: Marland Kitchen Difficulty breathing  . Swelling of your face and throat  . A fast heartbeat  . A bad rash all over your body  . Dizziness and weakness    Immunizations Administered    Name Date Dose VIS Date Route   Pfizer COVID-19 Vaccine 10/15/2019  3:15 PM 0.3 mL 08/12/2019 Intramuscular   Manufacturer: [employer], [employer]   Lot: [id number]   NDC: [address]-4

## 2019-11-07 ENCOUNTER — Ambulatory Visit: Payer: Medicare Other | Attending: Internal Medicine

## 2019-11-07 DIAGNOSIS — Z23 Encounter for immunization: Secondary | ICD-10-CM | POA: Insufficient documentation

## 2019-11-07 NOTE — Progress Notes (Signed)
   Covid-19 Vaccination Clinic  Name:  Yvonne Todd    MRN: [id number] DOB: 07/11/1951  11/07/2019  [name] was observed post Covid-19 immunization for 15 minutes without incident. She was provided with Vaccine Information Sheet and instruction to access the V-Safe system.   [name] was instructed to call 911 with any severe reactions post vaccine: Marland Kitchen Difficulty breathing  . Swelling of face and throat  . A fast heartbeat  . A bad rash all over body  . Dizziness and weakness   Immunizations Administered    Name Date Dose VIS Date Route   Pfizer COVID-19 Vaccine 11/07/2019  3:50 PM 0.3 mL 08/12/2019 Intramuscular   Manufacturer: [employer], [employer]   Lot: [id number]   NDC: [address]-7

## 2019-11-08 DIAGNOSIS — J188 Other pneumonia, unspecified organism: Secondary | ICD-10-CM | POA: Diagnosis not present

## 2019-11-08 DIAGNOSIS — J441 Chronic obstructive pulmonary disease with (acute) exacerbation: Secondary | ICD-10-CM | POA: Diagnosis not present

## 2019-11-08 DIAGNOSIS — Z96651 Presence of right artificial knee joint: Secondary | ICD-10-CM | POA: Diagnosis not present

## 2019-11-08 DIAGNOSIS — M1990 Unspecified osteoarthritis, unspecified site: Secondary | ICD-10-CM | POA: Diagnosis not present

## 2019-12-09 DIAGNOSIS — Z96651 Presence of right artificial knee joint: Secondary | ICD-10-CM | POA: Diagnosis not present

## 2019-12-09 DIAGNOSIS — M1990 Unspecified osteoarthritis, unspecified site: Secondary | ICD-10-CM | POA: Diagnosis not present

## 2019-12-09 DIAGNOSIS — J188 Other pneumonia, unspecified organism: Secondary | ICD-10-CM | POA: Diagnosis not present

## 2019-12-09 DIAGNOSIS — J441 Chronic obstructive pulmonary disease with (acute) exacerbation: Secondary | ICD-10-CM | POA: Diagnosis not present

## 2019-12-12 ENCOUNTER — Other Ambulatory Visit: Payer: Self-pay | Admitting: Internal Medicine

## 2019-12-12 DIAGNOSIS — J9601 Acute respiratory failure with hypoxia: Secondary | ICD-10-CM

## 2019-12-12 DIAGNOSIS — J449 Chronic obstructive pulmonary disease, unspecified: Secondary | ICD-10-CM

## 2019-12-26 ENCOUNTER — Telehealth: Payer: Self-pay | Admitting: Internal Medicine

## 2019-12-26 DIAGNOSIS — I5032 Chronic diastolic (congestive) heart failure: Secondary | ICD-10-CM

## 2019-12-26 NOTE — Progress Notes (Signed)
Note completed        Yvonne Todd [facility] Sched[facility]

## 2019-12-26 NOTE — Chronic Care Management (AMB) (Signed)
  Chronic Care Management   Note  12/26/2019 Name: MATISON NUCCIO MRN: [id number] DOB: [birth year]  ZHANIYA SWALLOWS is a 69 y.o. year old female who is a primary care patient of Okey Dupre Austin Miles, MD. I reached out to [name] by phone today in response to a referral sent by Ms. Wende Neighbors Capili's PCP, Myrlene Broker, MD.   Ms. Minich was given information about Chronic Care Management services today including:  1. CCM service includes personalized support from designated clinical staff supervised by her physician, including individualized plan of care and coordination with other care providers 2. 24/7 contact phone numbers for assistance for urgent and routine care needs. 3. Service will only be billed when office clinical staff spend 20 minutes or more in a month to coordinate care. 4. Only one practitioner may furnish and bill the service in a calendar month. 5. The patient may stop CCM services at any time (effective at the end of the month) by phone call to the office staff.   Patient agreed to services and verbal consent obtained.   This note is not being shared with the patient for the following reason: To respect privacy (The patient or proxy has requested that the information not be shared).  Follow up plan:   Raynicia Dukes [facility]

## 2020-01-08 DIAGNOSIS — Z96651 Presence of right artificial knee joint: Secondary | ICD-10-CM | POA: Diagnosis not present

## 2020-01-08 DIAGNOSIS — J441 Chronic obstructive pulmonary disease with (acute) exacerbation: Secondary | ICD-10-CM | POA: Diagnosis not present

## 2020-01-08 DIAGNOSIS — M1990 Unspecified osteoarthritis, unspecified site: Secondary | ICD-10-CM | POA: Diagnosis not present

## 2020-01-08 DIAGNOSIS — J188 Other pneumonia, unspecified organism: Secondary | ICD-10-CM | POA: Diagnosis not present

## 2020-01-10 ENCOUNTER — Other Ambulatory Visit: Payer: Self-pay | Admitting: Internal Medicine

## 2020-01-10 DIAGNOSIS — J9601 Acute respiratory failure with hypoxia: Secondary | ICD-10-CM

## 2020-01-10 DIAGNOSIS — J449 Chronic obstructive pulmonary disease, unspecified: Secondary | ICD-10-CM

## 2020-01-17 ENCOUNTER — Other Ambulatory Visit: Payer: Self-pay | Admitting: Internal Medicine

## 2020-01-17 ENCOUNTER — Ambulatory Visit: Payer: Medicare Other | Admitting: Pharmacist

## 2020-01-17 ENCOUNTER — Other Ambulatory Visit: Payer: Self-pay

## 2020-01-17 DIAGNOSIS — J449 Chronic obstructive pulmonary disease, unspecified: Secondary | ICD-10-CM

## 2020-01-17 DIAGNOSIS — I10 Essential (primary) hypertension: Secondary | ICD-10-CM

## 2020-01-17 DIAGNOSIS — E099 Drug or chemical induced diabetes mellitus without complications: Secondary | ICD-10-CM

## 2020-01-17 DIAGNOSIS — J9601 Acute respiratory failure with hypoxia: Secondary | ICD-10-CM

## 2020-01-17 DIAGNOSIS — E785 Hyperlipidemia, unspecified: Secondary | ICD-10-CM

## 2020-01-17 DIAGNOSIS — I5032 Chronic diastolic (congestive) heart failure: Secondary | ICD-10-CM

## 2020-01-17 MED ORDER — TRELEGY ELLIPTA 100-62.5-25 MCG/INH IN AEPB: 1.0000 | INHALATION_SPRAY | Freq: Every day | RESPIRATORY_TRACT | 3 refills | Status: DC

## 2020-01-17 NOTE — Patient Instructions (Addendum)
Visit Information  Thank you for meeting with me to discuss your medications! I look forward to working with you to achieve your health care goals. Below is a summary of what we talked about during the visit:  Goals Addressed            This Visit's Progress   . Pharmacy Care Plan       CARE PLAN ENTRY  Current Barriers:  . Chronic Disease Management support, education, and care coordination needs related to Hypertension, Hyperlipidemia, Diabetes, Heart Failure, and COPD   Hypertension/Heart Failure . Pharmacist Clinical Goal(s): o Over the next 60 days, patient will work with PharmD and providers to achieve BP goal <130/80 . Current regimen:  o Amlodipine 10 mg daily . Interventions: o Discussed BP goal and benefits of maintaining BP in goals range . Patient self care activities - Over the next 60 days, patient will: o Check BP 1-2 times weekly, document, and provide at future appointments o Ensure daily salt intake < 2300 mg/day  Hyperlipidemia/Cardiovascular risk reduction . Pharmacist Clinical Goal(s): o Over the next 60 days, patient will work with PharmD and providers to maintain LDL goal < 100 . Current regimen:  o Atorvastatin 40 mg HS o Aspirin 81 mg daily . Interventions: o Discussed role of cholesterol in cardiovascular disease and benefits of statin for risk reduction o Discussed benefits and risks of aspirin 81 mg for primary prevention of cardiovascular disease. Aspirin is still recommended for patients with diabetes to reduce cardiovascular risk. . Patient self care activities - Over the next 60 days, patient will: o Continue atorvastatin as directed o Continue aspirin 81 mg to reduce cardiovascular risk  Diabetes . Pharmacist Clinical Goal(s): o Over the next 60 days, patient will work with PharmD and providers to maintain A1c goal <8% . Current regimen:  o Levemir 20 units at bedtime . Interventions: o Discussed importance of monitoring blood sugar while  using insulin o Provide new glucometer and test strips that are covered by insurance . Patient self care activities - Over the next 60 days, patient will: o Check blood sugar once daily and in the morning before eating or drinking, document, and provide at future appointments o Contact provider with any episodes of hypoglycemia  COPD . Pharmacist Clinical Goal(s) o Over the next 60 days, patient will work with PharmD and providers to optimize inhaler regimen . Current regimen:  o Anoro Ellipta 1 puff daily o Albuterol HFA as needed o Albuterol neb as needed o Montelukast 10 mg daily at bedtime . Interventions: o Discussed differences between Anoro and Trelegy - Trelegy has same medications as Anoro plus additional inhaled steroid. Trelegy is covered by new insurance plan as Tier 1 drug. - Switch Anoro to Trelegy pending PCP approval o Refill albuterol as Proair or Proventil based on insurance formulary . Patient self care activities - Over the next 60 days, patient will: o Continue once daily maintenance inhaler o Utilize albuterol rescue inhaler as needed for shortness of breath  Medication management . Pharmacist Clinical Goal(s): o Over the next 60 days, patient will work with PharmD and providers to achieve optimal medication adherence . Current pharmacy: [facility] . Interventions o Comprehensive medication review performed. o Utilize [facility] pharmacy for medication synchronization, packaging and delivery . Patient self care activities - Over the next 60 days, patient will: o Focus on medication adherence by pill count o Take medications as prescribed o Report any questions or concerns to PharmD and/or provider(s)  Initial goal documentation        Yvonne Todd was given information about Chronic Care Management services today including:  1. CCM service includes personalized support from designated clinical staff supervised by her physician, including individualized plan of  care and coordination with other care providers 2. 24/7 contact phone numbers for assistance for urgent and routine care needs. 3. Standard insurance, coinsurance, copays and deductibles apply for chronic care management only during months in which we provide at least 20 minutes of these services. Most insurances cover these services at 100%, however patients may be responsible for any copay, coinsurance and/or deductible if applicable. This service may help you avoid the need for more expensive face-to-face services. 4. Only one practitioner may furnish and bill the service in a calendar month. 5. The patient may stop CCM services at any time (effective at the end of the month) by phone call to the office staff.  Patient agreed to services and verbal consent obtained.   The patient verbalized understanding of instructions provided today and agreed to receive a mailed copy of patient instruction and/or educational materials. Telephone follow up appointment with pharmacy team member scheduled for: 2 months  Al Corpus, PharmD Clinical Pharmacist [facility] [phone]   Eating Plan for Chronic Obstructive Pulmonary Disease Chronic obstructive pulmonary disease (COPD) causes symptoms such as shortness of breath, coughing, and chest discomfort. These symptoms can make it difficult to eat enough to maintain a healthy weight. Generally, people with COPD should eat a diet that is high in calories, protein, and other nutrients to maintain body weight and to keep the lungs as healthy as possible. Depending on the medicines you take and other health conditions you may have, your health care provider may give you additional recommendations on what to eat or avoid. Talk with your health care provider about your goals for body weight, and work with a dietitian to develop an eating plan that is right for you. What are tips for following this plan? Reading food  labels   Avoid foods with more than 300 milligrams (mg) of salt (sodium) per serving.  Choose foods that contain at least 4 grams (g) of fiber per serving. Try to eat 20-30 g of fiber each day.  Choose foods that are high in calories and protein, such as nuts, beans, yogurt, and cheese. Shopping  Do not buy foods labeled as diet, low-calorie, or low-fat.  If you are able to eat dairy products: ? Avoid low-fat or skim milk. ? Buy dairy products that have at least 2% fat.  Buy nutritional supplement drinks.  Buy grains and prepared foods labeled as enriched or fortified.  Consider buying low-sodium, pre-made foods to conserve energy for eating. Cooking  Add dry milk or protein powder to smoothies.  Cook with healthy fats, such as olive oil, canola oil, sunflower oil, and grapeseed oil.  Add oil, butter, cream cheese, or nut butters to foods to increase fat and calories.  To make foods easier to chew and swallow: ? Cook vegetables, pasta, and rice until soft. ? Cut or grind meat into very small pieces. ? Dip breads in liquid. Meal planning   Eat when you feel hungry.  Eat 5-6 small meals throughout the day.  Drink 6-8 glasses of water each day.  Do not drink liquids with meals. Drink liquids at the end of the meal to avoid feeling full too quickly.  Eat a variety of fruits and vegetables every day.  Ask for assistance from family or friends with planning and preparing meals as needed.  Avoid foods that cause you to feel bloated, such as carbonated drinks, fried foods, beans, broccoli, cabbage, and apples.  For older adults, ask your local agency on aging whether you are eligible for meal assistance programs, such as Meals on Wheels. Lifestyle   Do not smoke.  Eat slowly. Take small bites and chew food well before swallowing.  Do not overeat. This may make it more difficult to breathe after eating.  Sit up while eating.  If needed, continue to use  supplemental oxygen while eating.  Rest or relax for 30 minutes before and after eating.  Monitor your weight as told by your health care provider.  Exercise as told by your health care provider. What foods can I eat? Fruits All fresh, dried, canned, or frozen fruits that do not cause gas. Vegetables All fresh, canned (no salt added), or frozen vegetables that do not cause gas. Grains Whole grain bread. Enriched whole grain pasta. Fortified whole grain cereals. Fortified rice. Quinoa. Meats and other proteins Lean meat. Poultry. Fish. Dried beans. Unsalted nuts. Tofu. Eggs. Nut butters. Dairy Whole or 2% milk. Cheese. Yogurt. Fats and oils Olive oil. Canola oil. Butter. Margarine. Beverages Water. Vegetable juice (no salt added). Decaffeinated coffee. Decaffeinated or herbal tea. Seasonings and condiments Fresh or dried herbs. Low-salt or salt-free seasonings. Low-sodium soy sauce. The items listed above may not be a complete list of foods and beverages you can eat. Contact a dietitian for more information. What foods are not recommended? Fruits Fruits that cause gas, such as apples or melon. Vegetables Vegetables that cause gas, such as broccoli, Brussels sprouts, cabbage, cauliflower, and onions. Canned vegetables with added salt. Meats and other proteins Fried meat. Salt-cured meat. Processed meat. Dairy Fat-free or low-fat milk, yogurt, or cheese. Processed cheese. Beverages Carbonated drinks. Caffeinated drinks, such as coffee, tea, and soft drinks. Juice. Alcohol. Vegetable juice with added salt. Seasonings and condiments Salt. Seasoning mixes with salt. Soy sauce. Angie Fava. Other foods Clear soup or broth. Fried foods. Prepared frozen meals. The items listed above may not be a complete list of foods and beverages you should avoid. Contact a dietitian for more information. Summary  COPD symptoms can make it difficult to eat enough to maintain a healthy weight.  A  COPD eating plan can help you maintain your body weight and keep your lungs as healthy as possible.  Eat a diet that is high in calories, protein, and other nutrients. Read labels to make sure that you are getting the right nutrients. Cook foods to make them easy to chew and swallow.  Eat 5-6 small meals throughout the day, and avoid foods that cause gas or make you feel bloated. This information is not intended to replace advice given to you by your health care provider. Make sure you discuss any questions you have with your health care provider. Document Revised: 12/09/2018 Document Reviewed: 11/03/2017 Elsevier Patient Education  [place].

## 2020-01-17 NOTE — Chronic Care Management (AMB) (Signed)
Chronic Care Management Pharmacy  Name: Yvonne Todd  MRN: [id number] DOB: June 10, 1951  Chief Complaint/ HPI  Yvonne Todd,  69 y.o. , female presents for their Initial CCM visit with the clinical pharmacist via telephone due to COVID-19 Pandemic.  PCP : Hoyt Koch, MD  Their chronic conditions include: HTN, CHF, T2DM, HLD, COPD, allergies, arthritis  Was mother's caregiver, she died a few years ago. Working on cleaning out her house still with her sister. Enjoys crafting, wants to get a chihuahua.   Office Visits: 09/08/19 Dr Sharlet Salina OV: multiple steroid courses for COPD triggered worsening DM. A1c 7.1, improved, continue Levemir 20 units but may consider dose decrease if more low sugars.  Consult Visit: 05/10/2018 Dr Vaughan Browner (pulmonary): recurrent COPD exacerbations, sx improved on Trelegy,  O2.  Medications: Outpatient Encounter Medications as of 01/17/2020  Medication Sig Note  . albuterol (PROVENTIL) (2.5 MG/3ML) 0.083% nebulizer solution Take 3 mLs (2.5 mg total) by nebulization 3 (three) times daily.   Marland Kitchen amLODipine (NORVASC) 10 MG tablet Take 1 tablet (10 mg total) by mouth daily.   Marland Kitchen aspirin EC 81 MG tablet Take 1 tablet (81 mg total) by mouth daily.   Marland Kitchen atorvastatin (LIPITOR) 40 MG tablet Take 1 tablet (40 mg total) by mouth at bedtime.   . blood glucose meter kit and supplies Dispense based on patient and insurance preference. Use up to four times daily as directed. (FOR ICD-10 E10.9, E11.9).   Marland Kitchen diphenhydrAMINE (BENADRYL) 25 MG tablet Take 50 mg by mouth at bedtime.   . diphenhydramine-acetaminophen (TYLENOL PM) 25-500 MG TABS tablet Take 2 tablets by mouth at bedtime as needed (For pain.).   Marland Kitchen fluticasone (FLONASE) 50 MCG/ACT nasal spray Place 2 sprays into both nostrils daily.   Marland Kitchen guaiFENesin (MUCINEX) 600 MG 12 hr tablet Take 1 tablet (600 mg total) by mouth 2 (two) times daily.   . Insulin Detemir (LEVEMIR) 100 UNIT/ML Pen Inject 20 Units into the skin daily  at 10 pm.   . Insulin Pen Needle 31G X 5 MM MISC levemir 20 units at bedtime and novolog as per sliding scale   . Melatonin 5 MG TABS Take 5 mg by mouth at bedtime as needed (sleep).    . montelukast (SINGULAIR) 10 MG tablet Take 1 tablet (10 mg total) by mouth at bedtime. Annual appt due in July must see provider for future refills   . nitroGLYCERIN (NITROSTAT) 0.4 MG SL tablet Place 1 tablet (0.4 mg total) under the tongue every 5 (five) minutes as needed for chest pain. 01/30/2017: Patient stated that she has never used this and that her bottle expired so she threw it away.  . umeclidinium-vilanterol (ANORO ELLIPTA) 62.5-25 MCG/INH AEPB Inhale 1 puff into the lungs daily.   Marland Kitchen albuterol (VENTOLIN HFA) 108 (90 Base) MCG/ACT inhaler Inhale 2 puffs into the lungs every 4 (four) hours as needed for wheezing or shortness of breath. (Patient not taking: Reported on 01/17/2020)   . budesonide (PULMICORT) 0.5 MG/2ML nebulizer solution Take 2 mLs (0.5 mg total) by nebulization 2 (two) times daily. (Patient not taking: Reported on 01/17/2020)   . calcium carbonate (CALCIUM 600) 600 MG TABS tablet Take 600 mg by mouth 2 (two) times daily with a meal.   . Fluticasone-Umeclidin-Vilant (TRELEGY ELLIPTA) 100-62.5-25 MCG/INH AEPB Inhale 1 puff into the lungs daily. (Patient not taking: Reported on 01/17/2020)   . ipratropium-albuterol (DUONEB) 0.5-2.5 (3) MG/3ML SOLN Take 3 mLs by nebulization every 4 (four) hours  as needed (shortness of breath). (Patient not taking: Reported on 01/17/2020)   . Multiple Vitamin (MULTIVITAMIN) tablet Take 1 tablet by mouth daily.    No facility-administered encounter medications on file as of 01/17/2020.    Current Diagnosis/Assessment:  SDOH Interventions     Most Recent Value  SDOH Interventions  SDOH Interventions for the Following Domains  Financial Strain  Financial Strain Interventions  -- [formulary reviewed to switch medications to covered products]      Goals Addressed             This Visit's Progress   . Pharmacy Care Plan       CARE PLAN ENTRY  Current Barriers:  . Chronic Disease Management support, education, and care coordination needs related to Hypertension, Hyperlipidemia, Diabetes, Heart Failure, and COPD   Hypertension/Heart Failure . Pharmacist Clinical Goal(s): o Over the next 60 days, patient will work with PharmD and providers to achieve BP goal <130/80 . Current regimen:  o Amlodipine 10 mg daily . Interventions: o Discussed BP goal and benefits of maintaining BP in goals range . Patient self care activities - Over the next 60 days, patient will: o Check BP 1-2 times weekly, document, and provide at future appointments o Ensure daily salt intake < 2300 mg/day  Hyperlipidemia/Cardiovascular risk reduction . Pharmacist Clinical Goal(s): o Over the next 60 days, patient will work with PharmD and providers to maintain LDL goal < 100 . Current regimen:  o Atorvastatin 40 mg HS o Aspirin 81 mg daily . Interventions: o Discussed role of cholesterol in cardiovascular disease and benefits of statin for risk reduction o Discussed benefits and risks of aspirin 81 mg for primary prevention of cardiovascular disease. Aspirin is still recommended for patients with diabetes to reduce cardiovascular risk. . Patient self care activities - Over the next 60 days, patient will: o Continue atorvastatin as directed o Continue aspirin 81 mg to reduce cardiovascular risk  Diabetes . Pharmacist Clinical Goal(s): o Over the next 60 days, patient will work with PharmD and providers to maintain A1c goal <8% . Current regimen:  o Levemir 20 units at bedtime . Interventions: o Discussed importance of monitoring blood sugar while using insulin o Provide new glucometer and test strips that are covered by insurance . Patient self care activities - Over the next 60 days, patient will: o Check blood sugar once daily and in the morning before eating or  drinking, document, and provide at future appointments o Contact provider with any episodes of hypoglycemia  COPD . Pharmacist Clinical Goal(s) o Over the next 60 days, patient will work with PharmD and providers to optimize inhaler regimen . Current regimen:  o Anoro Ellipta 1 puff daily o Albuterol HFA as needed o Albuterol neb as needed o Montelukast 10 mg daily at bedtime . Interventions: o Discussed differences between Anoro and Trelegy - Trelegy has same medications as Anoro plus additional inhaled steroid. Trelegy is covered by new insurance plan as Tier 1 drug. - Switch Anoro to Trelegy pending PCP approval o Refill albuterol as Proair or Proventil based on insurance formulary . Patient self care activities - Over the next 60 days, patient will: o Continue once daily maintenance inhaler o Utilize albuterol rescue inhaler as needed for shortness of breath  Medication management . Pharmacist Clinical Goal(s): o Over the next 60 days, patient will work with PharmD and providers to achieve optimal medication adherence . Current pharmacy: [facility] . Interventions o Comprehensive medication review performed. o Utilize  [facility] for medication synchronization, packaging and delivery . Patient self care activities - Over the next 60 days, patient will: o Focus on medication adherence by pill count o Take medications as prescribed o Report any questions or concerns to PharmD and/or provider(s)  Initial goal documentation       COPD / Asthma / Tobacco   Last spirometry score: FEV1 32% predicted, FEV1/FVC 0.43  Gold Grade: Gold 3 (FEV1 30-49%) Current COPD Classification:  D (high sx, >/=2 exacerbations/yr)  Eosinophil count:   Lab Results  Component Value Date/Time   EOSPCT 0 02/05/2018 05:56 AM  %                               Eos (Absolute):  Lab Results  Component Value Date/Time   EOSABS 0.0 02/05/2018 05:56 AM   Tobacco Status:  Social History    Tobacco Use  Smoking Status Former Smoker  . Packs/day: 1.00  . Years: 40.00  . Pack years: 40.00  . Types: Cigarettes  . Start date: 09/01/1973  . Quit date: 03/19/2018  . Years since quitting: 1.8  Smokeless Tobacco Never Used    Patient has failed these meds in past: Trelegy Patient is currently controlled on the following medications:   Anoro Ellipta 1 puff daily  Duoneb PRN  Albuterol HFA PRN  Albuterol neb PRN  Montelukast 10 mg daily HS  Using maintenance inhaler regularly? Yes Frequency of rescue inhaler use:  several times per month  We discussed:  proper inhaler technique; pt reports Trelegy worked better than Anoro but she had to switch due to insurance last year. She now has new insurance plan and per formulary Trelegy and Anoro are covered at same tier. Also, pt tried to pick up Ventolin last week but it is not covered - per formulary Proair or Proventil are preferred products.  Plan  Recommend switch Anoro to Trelegy pending PCP approval Send Proair to pharmacy based on formulary  Heart Failure/Hypertension   Type: Diastolic  Last ejection fraction: 60-65% (02/04/2018) NYHA Class: I (no actitivty limitation) AHA HF Stage: A (HF risk factors present)  Office blood pressures are  BP Readings from Last 3 Encounters:  Am01/07/21 (!) 162/88  03/09/19 138/90  05/10/18 (!) 142/80   Patient checks BP at home infrequently   Patient home BP readings are ranging: 120/70-80  Patient has failed these meds in past: n/a Patient is currently controlled on the following medications:   Amlodipine 10 mg daily  Nitroglycerin 0.4 mg SL PRN  We discussed diet and exercise extensively; pt endorses white coat hypertension, and per reported home BP this is likely.   Plan  Continue current medications and control with diet and exercise  Hyperlipidemia   Lipid Panel     Component Value Date/Time   CHOL 158 03/09/2019 0842   TRIG 103.0 03/09/2019 0842   HDL  40.60 03/09/2019 0842   CHOLHDL 4 03/09/2019 0842   VLDL 20.6 03/09/2019 0842   LDLCALC 97 03/09/2019 0842    The 10-year ASCVD risk score Mikey Bussing DC Jr., et al., 2013) is: 31.1%   Values used to calculate the score:     Age: 53 years     Sex: Female     Is Non-Hispanic African American: No     Diabetic: Yes     Tobacco smoker: No     Systolic Blood Pressure: 144 mmHg     Is BP  treated: Yes     HDL Cholesterol: 40.6 mg/dL     Total Cholesterol: 158 mg/dL   Patient has failed these meds in past: n/a Patient is currently controlled on the following medications:   Atorvastatin 40 mg HS  Aspirin 81 mg daily  We discussed:  diet and exercise extensively; cholesterol goals and benefits of statin. Pt asked if she still needs aspirin, discussed benefits/risks for primary prevention, given hx of diabetes and ASCVD risk > 20% patient likely still benefits from aspirin.  Plan  Continue current medications and control with diet and exercise  Diabetes   Recent Relevant Labs: Lab Results  Component Value Date/Time   HGBA1C 7.1 (A) 09/08/2019 01:26 PM   HGBA1C 7.9 (H) 03/09/2019 08:42 AM   HGBA1C 7.0 (H) 02/05/2018 05:56 AM   GFR 116.90 03/09/2019 08:42 AM   GFR 100.69 07/02/2015 11:26 AM    Checking BG: not checking currently due to broken meter  Recent FBG Readings: n/a  Patient has failed these meds in past: glipizide, Humalog Patient is currently controlled on the following medications:   Levemir 20 units HS  Last diabetic Eye exam: No results found for: HMDIABEYEEXA  Last diabetic Foot exam: No results found for: HMDIABFOOTEX   We discussed: diet and exercise extensively and how to recognize and treat signs of hypoglycemia; importance of monitoring BG at home; pt reports her test strips were not covered at [facility], she has not been checking BG due to broken meter.  Plan  Continue current medications and control with diet and exercise  Coordinate new glucometer and test  strips per formulary   Health Maintenance   Patient is currently controlled on the following medications:   Benadryl  Mucinex  Flonase nasal spray  Tylenol PM  Calcium carbonate 600 mg BID - not taking currently  Multivitamin - not taking currently  Melatonin 5 mg HS  We discussed:  Patient is satisfied with current OTC regimen. She ran out of vitamins and calcium and has not replenished supply due to avoidance of crowds during pandemic   Plan  Continue current medications  Coordinate delivery of OTC medications   Medication Management   Pt uses [facility] for all medications Uses pill box? -NO Pt endorses 99% compliance  We discussed: Verbal consent obtained for [facility] enhanced pharmacy services (medication synchronization, adherence packaging, delivery coordination). A medication sync plan was created to allow patient to get all medications delivered once every 30 to 90 days per patient preference. Patient understands they have freedom to choose pharmacy and clinical pharmacist will coordinate care between all prescribers and [facility].    Plan  Utilize [facility] for medication synchronization, packaging and delivery    Follow up: 2 month phone visit  Charlene Brooke, PharmD Clinical Pharmacist [facility] [phone]

## 2020-01-18 ENCOUNTER — Other Ambulatory Visit: Payer: Self-pay

## 2020-01-18 DIAGNOSIS — J9601 Acute respiratory failure with hypoxia: Secondary | ICD-10-CM

## 2020-01-18 MED ORDER — INSULIN PEN NEEDLE 31G X 5 MM MISC: 0 refills | Status: DC

## 2020-01-18 MED ORDER — FLUTICASONE PROPIONATE 50 MCG/ACT NA SUSP: 2.0000 | Freq: Every day | NASAL | 2 refills | Status: DC

## 2020-01-18 MED ORDER — BLOOD GLUCOSE METER KIT: PACK | 0 refills | Status: DC

## 2020-01-18 MED ORDER — ALBUTEROL SULFATE (2.5 MG/3ML) 0.083% IN NEBU: 2.5000 mg | INHALATION_SOLUTION | Freq: Three times a day (TID) | RESPIRATORY_TRACT | 2 refills | Status: DC

## 2020-01-18 MED ORDER — ALBUTEROL SULFATE HFA 108 (90 BASE) MCG/ACT IN AERS: 2.0000 | INHALATION_SPRAY | RESPIRATORY_TRACT | 3 refills | Status: DC | PRN

## 2020-01-18 NOTE — Addendum Note (Signed)
Addended by: Radford Pax M on: 01/18/2020 11:19 AM   Modules accepted: Orders

## 2020-02-08 DIAGNOSIS — J188 Other pneumonia, unspecified organism: Secondary | ICD-10-CM | POA: Diagnosis not present

## 2020-02-08 DIAGNOSIS — M1990 Unspecified osteoarthritis, unspecified site: Secondary | ICD-10-CM | POA: Diagnosis not present

## 2020-02-08 DIAGNOSIS — J441 Chronic obstructive pulmonary disease with (acute) exacerbation: Secondary | ICD-10-CM | POA: Diagnosis not present

## 2020-02-08 DIAGNOSIS — Z96651 Presence of right artificial knee joint: Secondary | ICD-10-CM | POA: Diagnosis not present

## 2020-02-21 ENCOUNTER — Encounter: Payer: Self-pay | Admitting: Emergency Medicine

## 2020-02-21 ENCOUNTER — Other Ambulatory Visit: Payer: Self-pay

## 2020-02-21 ENCOUNTER — Ambulatory Visit
Admission: EM | Admit: 2020-02-21 | Discharge: 2020-02-21 | Disposition: A | Discharge status: Home / Self Care | Payer: Medicare Other | Attending: Physician Assistant | Admitting: Physician Assistant

## 2020-02-21 ENCOUNTER — Ambulatory Visit (INDEPENDENT_AMBULATORY_CARE_PROVIDER_SITE_OTHER): Payer: Medicare Other

## 2020-02-21 DIAGNOSIS — M25551 Pain in right hip: Secondary | ICD-10-CM | POA: Diagnosis not present

## 2020-02-21 DIAGNOSIS — W1843XA Slipping, tripping and stumbling without falling due to stepping from one level to another, initial encounter: Secondary | ICD-10-CM | POA: Diagnosis not present

## 2020-02-21 DIAGNOSIS — M1611 Unilateral primary osteoarthritis, right hip: Secondary | ICD-10-CM | POA: Diagnosis not present

## 2020-02-21 DIAGNOSIS — S79911A Unspecified injury of right hip, initial encounter: Secondary | ICD-10-CM | POA: Diagnosis not present

## 2020-02-21 DIAGNOSIS — M47816 Spondylosis without myelopathy or radiculopathy, lumbar region: Secondary | ICD-10-CM | POA: Diagnosis not present

## 2020-02-21 MED ORDER — MELOXICAM 7.5 MG PO TABS
7.5000 mg | ORAL_TABLET | Freq: Every day | ORAL | 0 refills | Status: DC
Start: 2020-02-21 — End: 2021-03-07

## 2020-02-21 MED ORDER — KETOROLAC TROMETHAMINE 15 MG/ML IJ SOLN
15.0000 mg | Freq: Once | INTRAMUSCULAR | Status: AC
  Administered 2020-02-21: 15 mg via INTRAMUSCULAR

## 2020-02-21 MED ORDER — TIZANIDINE HCL 2 MG PO TABS
2.0000 mg | ORAL_TABLET | Freq: Two times a day (BID) | ORAL | 0 refills | Status: DC | PRN
Start: 2020-02-21 — End: 2021-12-26

## 2020-02-21 NOTE — Discharge Instructions (Signed)
X-ray did not show any fractures to the hip or pelvis. As discussed, did see a lesion to your sacrum that is not related to current symptoms. However, please mention to your primary care the next time you see her. Toradol injection given in office today. Start Mobic. Do not take ibuprofen (motrin/advil)/ naproxen (aleve) while on mobic. Tizanidine as needed, this can make you drowsy, so do not take if you are going to drive, operate heavy machinery, or make important decisions. Ice/heat compresses as needed. Follow up with PCP/orthopedics if symptoms worsen, changes for reevaluation. If experience numbness/tingling of the inner thighs, loss of bladder or bowel control, go to the emergency department for evaluation.
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
## 2020-02-21 NOTE — ED Notes (Signed)
Looked at torso/hip/leg.  No bruising visible, no abrasion
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
## 2020-03-08 ENCOUNTER — Ambulatory Visit: Payer: PRIVATE HEALTH INSURANCE | Admitting: Internal Medicine

## 2020-03-09 DIAGNOSIS — J188 Other pneumonia, unspecified organism: Secondary | ICD-10-CM | POA: Diagnosis not present

## 2020-03-09 DIAGNOSIS — Z96651 Presence of right artificial knee joint: Secondary | ICD-10-CM | POA: Diagnosis not present

## 2020-03-09 DIAGNOSIS — M1990 Unspecified osteoarthritis, unspecified site: Secondary | ICD-10-CM | POA: Diagnosis not present

## 2020-03-09 DIAGNOSIS — J441 Chronic obstructive pulmonary disease with (acute) exacerbation: Secondary | ICD-10-CM | POA: Diagnosis not present

## 2020-03-12 ENCOUNTER — Other Ambulatory Visit: Payer: Self-pay

## 2020-03-12 ENCOUNTER — Ambulatory Visit: Payer: Medicare Other | Admitting: Pharmacist

## 2020-03-12 DIAGNOSIS — J449 Chronic obstructive pulmonary disease, unspecified: Secondary | ICD-10-CM

## 2020-03-12 DIAGNOSIS — T380X5A Adverse effect of glucocorticoids and synthetic analogues, initial encounter: Secondary | ICD-10-CM

## 2020-03-12 DIAGNOSIS — I5032 Chronic diastolic (congestive) heart failure: Secondary | ICD-10-CM

## 2020-03-12 DIAGNOSIS — I10 Essential (primary) hypertension: Secondary | ICD-10-CM

## 2020-03-12 DIAGNOSIS — E785 Hyperlipidemia, unspecified: Secondary | ICD-10-CM
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
## 2020-03-12 NOTE — Patient Instructions (Addendum)
Visit Information  Phone number for Pharmacist: [phone]  Goals Addressed            This Visit's Progress   . Pharmacy Care Plan       CARE PLAN ENTRY  Current Barriers:  . Chronic Disease Management support, education, and care coordination needs related to Hypertension, Hyperlipidemia, Diabetes, Heart Failure, and COPD   Hypertension/Heart Failure BP Readings from Last 3 Encounters:  02/21/20 136/74  09/08/19 (!) 162/88  03/09/19 138/90 .  Pharmacist Clinical Goal(s): o Over the next 90 days, patient will work with PharmD and providers to achieve BP goal <130/80 . Current regimen:  o Amlodipine 10 mg daily . Interventions: o Discussed BP goal and benefits of maintaining BP in goals range . Patient self care activities - Over the next 90 days, patient will: o Check BP 1-2 times weekly, document, and provide at future appointments o Ensure daily salt intake < 2300 mg/day  Hyperlipidemia/Cardiovascular risk reduction Lab Results  Component Value Date/Time   LDLCALC 97 03/09/2019 08:42 AM .  Pharmacist Clinical Goal(s): o Over the next 90 days, patient will work with PharmD and providers to maintain LDL goal < 100 . Current regimen:  o Atorvastatin 40 mg HS o Aspirin 81 mg daily . Interventions: o Discussed role of cholesterol in cardiovascular disease and benefits of statin for risk reduction o Discussed benefits and risks of aspirin 81 mg for primary prevention of cardiovascular disease. Aspirin is still recommended for patients with diabetes to reduce cardiovascular risk. . Patient self care activities - Over the next 60 days, patient will: o Continue atorvastatin as directed o Continue aspirin 81 mg to reduce cardiovascular risk  Diabetes Lab Results  Component Value Date/Time   HGBA1C 7.1 (A) 09/08/2019 01:26 PM   HGBA1C 7.9 (H) 03/09/2019 08:42 AM   HGBA1C 7.0 (H) 02/05/2018 05:56 AM .  Pharmacist Clinical Goal(s): o Over the next 90 days, patient will  work with PharmD and providers to maintain A1c goal <8% . Current regimen:  o Levemir 20 units at bedtime . Interventions: o Discussed importance of monitoring blood sugar while using insulin o Delivered new glucometer and test strips that are covered by insurance . Patient self care activities - Over the next 90 days, patient will: o Check blood sugar once daily and in the morning before eating or drinking, document, and provide at future appointments o Contact provider with any episodes of hypoglycemia  COPD . Pharmacist Clinical Goal(s) o Over the next 90 days, patient will work with PharmD and providers to optimize inhaler regimen . Current regimen:  o Trelegy Ellipta 1 puff daily o Albuterol HFA as needed o Albuterol neb as needed o Montelukast 10 mg daily at bedtime . Interventions: o Switched Anoro to [employer] as they are same insurance tier o Ensure availability of covered albuterol HFA and nebulizer solution . Patient self care activities - Over the next 90 days, patient will: o Continue once daily maintenance inhaler - Trelegy o Utilize albuterol rescue inhaler as needed for shortness of breath o Utilize albuterol nebulizer solution for more severe symptoms  Medication management . Pharmacist Clinical Goal(s): o Over the next 90 days, patient will work with PharmD and providers to achieve optimal medication adherence . Current pharmacy: [facility] . Interventions o Comprehensive medication review performed. o Utilize [facility] pharmacy for medication synchronization, packaging and delivery . Patient self care activities - Over the next 90 days, patient will: o Focus on medication adherence by fill date  o Take medications as prescribed o Report any questions or concerns to PharmD and/or provider(s)  Please see past updates related to this goal by clicking on the "Past Updates" button in the selected goal        Patient verbalizes understanding of instructions provided  today.   Telephone follow up appointment with pharmacy team member scheduled for: 3 months  Al Corpus, PharmD Clinical Pharmacist [facility] [phone]  Blood Glucose Monitoring, Adult Monitoring your blood sugar (glucose) is an important part of managing your diabetes (diabetes mellitus). Blood glucose monitoring involves checking your blood glucose as often as directed and keeping a record (log) of your results over time. Checking your blood glucose regularly and keeping a blood glucose log can:  Help you and your health care provider adjust your diabetes management plan as needed, including your medicines or insulin.  Help you understand how food, exercise, illnesses, and medicines affect your blood glucose.  Let you know what your blood glucose is at any time. You can quickly find out if you have low blood glucose (hypoglycemia) or high blood glucose (hyperglycemia). Your health care provider will set individualized treatment goals for you. Your goals will be based on your age, other medical conditions you have, and how you respond to diabetes treatment. Generally, the goal of treatment is to maintain the following blood glucose levels:  Before meals (preprandial): 80-130 mg/dL (9.4-8.5 mmol/L).  After meals (postprandial): below 180 mg/dL (10 mmol/L).  A1c level: less than 7%. Supplies needed:  Blood glucose meter.  Test strips for your meter. Each meter has its own strips. You must use the strips that came with your meter.  A needle to prick your finger (lancet). Do not use a lancet more than one time.  A device that holds the lancet (lancing device).  A journal or log book to write down your results. How to check your blood glucose  1. Wash your hands with soap and water. 2. Prick the side of your finger (not the tip) with the lancet. Use a different finger each time. 3. Gently rub the finger until a small drop of blood  appears. 4. Follow instructions that come with your meter for inserting the test strip, applying blood to the strip, and using your blood glucose meter. 5. Write down your result and any notes. Some meters allow you to use areas of your body other than your finger (alternative sites) to test your blood. The most common alternative sites are:  Forearm.  Thigh.  Palm of the hand. If you think you may have hypoglycemia, or if you have a history of not knowing when your blood glucose is getting low (hypoglycemia unawareness), do not use alternative sites. Use your finger instead. Alternative sites may not be as accurate as the fingers, because blood flow is slower in these areas. This means that the result you get may be delayed, and it may be different from the result that you would get from your finger. Follow these instructions at home: Blood glucose log   Every time you check your blood glucose, write down your result. Also write down any notes about things that may be affecting your blood glucose, such as your diet and exercise for the day. This information can help you and your health care provider: ? Look for patterns in your blood glucose over time. ? Adjust your diabetes management plan as needed.  Check if your meter allows you to download your records  to a computer. Most glucose meters store a record of glucose readings in the meter. If you have type 2 diabetes:  If you take insulin or other diabetes medicines, check your blood glucose 2 or more times a day.  If you are on intensive insulin therapy, check your blood glucose 4 or more times a day. Occasionally, you may also need to check between 2:00 a.m. and 3:00 a.m., as directed.  Also check your blood glucose: ? Before and after exercise. ? Before potentially dangerous tasks, like driving or using heavy machinery.  You may need to check your blood glucose more often if: ? Your medicine is being adjusted. ? Your diabetes is  not well-controlled. ? You are ill. General tips  Always keep your supplies with you.  If you have questions or need help, all blood glucose meters have a 24-hour "hotline" phone number that you can call. You may also contact your health care provider.  After you use a few boxes of test strips, adjust (calibrate) your blood glucose meter by following instructions that came with your meter. Contact a health care provider if:  Your blood glucose is at or above 240 mg/dL (91.6 mmol/L) for 2 days in a row.  You have been sick or have had a fever for 2 days or longer, and you are not getting better.  You have any of the following problems for more than 6 hours: ? You cannot eat or drink. ? You have nausea or vomiting. ? You have diarrhea. Get help right away if:  Your blood glucose is lower than 54 mg/dL (3 mmol/L).  You become confused or you have trouble thinking clearly.  You have difficulty breathing.  You have moderate or large ketone levels in your urine. Summary  Monitoring your blood sugar (glucose) is an important part of managing your diabetes (diabetes mellitus).  Blood glucose monitoring involves checking your blood glucose as often as directed and keeping a record (log) of your results over time.  Your health care provider will set individualized treatment goals for you. Your goals will be based on your age, other medical conditions you have, and how you respond to diabetes treatment.  Every time you check your blood glucose, write down your result. Also write down any notes about things that may be affecting your blood glucose, such as your diet and exercise for the day. This information is not intended to replace advice given to you by your health care provider. Make sure you discuss any questions you have with your health care provider. Document Revised: 06/11/2018 Document Reviewed: 01/28/2016 Elsevier Patient Education  2020 [employer].

## 2020-03-13 ENCOUNTER — Encounter: Payer: Self-pay | Admitting: Internal Medicine

## 2020-03-13 ENCOUNTER — Other Ambulatory Visit: Payer: Self-pay

## 2020-03-13 ENCOUNTER — Ambulatory Visit (INDEPENDENT_AMBULATORY_CARE_PROVIDER_SITE_OTHER): Payer: Medicare Other | Admitting: Internal Medicine

## 2020-03-13 VITALS — BP 154/94 | HR 144 | Temp 98.9°F | Ht 62.5 in | Wt 180.0 lb

## 2020-03-13 DIAGNOSIS — I5032 Chronic diastolic (congestive) heart failure: Secondary | ICD-10-CM

## 2020-03-13 DIAGNOSIS — E785 Hyperlipidemia, unspecified: Secondary | ICD-10-CM

## 2020-03-13 DIAGNOSIS — J449 Chronic obstructive pulmonary disease, unspecified: Secondary | ICD-10-CM

## 2020-03-13 DIAGNOSIS — J9611 Chronic respiratory failure with hypoxia: Secondary | ICD-10-CM | POA: Diagnosis not present

## 2020-03-13 DIAGNOSIS — T380X5D Adverse effect of glucocorticoids and synthetic analogues, subsequent encounter: Secondary | ICD-10-CM

## 2020-03-13 DIAGNOSIS — Z0000 Encounter for general adult medical examination without abnormal findings: Secondary | ICD-10-CM

## 2020-03-13 DIAGNOSIS — I10 Essential (primary) hypertension: Secondary | ICD-10-CM

## 2020-03-13 DIAGNOSIS — E1169 Type 2 diabetes mellitus with other specified complication: Secondary | ICD-10-CM | POA: Diagnosis not present

## 2020-03-13 DIAGNOSIS — E099 Drug or chemical induced diabetes mellitus without complications: Secondary | ICD-10-CM

## 2020-03-13 DIAGNOSIS — E2839 Other primary ovarian failure: Secondary | ICD-10-CM

## 2020-03-13 NOTE — Patient Instructions (Signed)
Ask about the shingrix vaccine at the pharmacy to get one and then the second one about 2-6 months later.    Health Maintenance, Female Adopting a healthy lifestyle and getting preventive care are important in promoting health and wellness. Ask your health care provider about:  The right schedule for you to have regular tests and exams.  Things you can do on your own to prevent diseases and keep yourself healthy. What should I know about diet, weight, and exercise? Eat a healthy diet   Eat a diet that includes plenty of vegetables, fruits, low-fat dairy products, and lean protein.  Do not eat a lot of foods that are high in solid fats, added sugars, or sodium. Maintain a healthy weight Body mass index (BMI) is used to identify weight problems. It estimates body fat based on height and weight. Your health care provider can help determine your BMI and help you achieve or maintain a healthy weight. Get regular exercise Get regular exercise. This is one of the most important things you can do for your health. Most adults should:  Exercise for at least 150 minutes each week. The exercise should increase your heart rate and make you sweat (moderate-intensity exercise).  Do strengthening exercises at least twice a week. This is in addition to the moderate-intensity exercise.  Spend less time sitting. Even light physical activity can be beneficial. Watch cholesterol and blood lipids Have your blood tested for lipids and cholesterol at 70 years of age, then have this test every 5 years. Have your cholesterol levels checked more often if:  Your lipid or cholesterol levels are high.  You are older than 70 years of age.  You are at high risk for heart disease. What should I know about cancer screening? Depending on your health history and family history, you may need to have cancer screening at various ages. This may include screening for:  Breast cancer.  Cervical cancer.  Colorectal  cancer.  Skin cancer.  Lung cancer. What should I know about heart disease, diabetes, and high blood pressure? Blood pressure and heart disease  High blood pressure causes heart disease and increases the risk of stroke. This is more likely to develop in people who have high blood pressure readings, are of African descent, or are overweight.  Have your blood pressure checked: ? Every 3-5 years if you are 52-41 years of age. ? Every year if you are 86 years old or older. Diabetes Have regular diabetes screenings. This checks your fasting blood sugar level. Have the screening done:  Once every three years after age 71 if you are at a normal weight and have a low risk for diabetes.  More often and at a younger age if you are overweight or have a high risk for diabetes. What should I know about preventing infection? Hepatitis B If you have a higher risk for hepatitis B, you should be screened for this virus. Talk with your health care provider to find out if you are at risk for hepatitis B infection. Hepatitis C Testing is recommended for:  Everyone born from [birth year] through 1966.  Anyone with known risk factors for hepatitis C. Sexually transmitted infections (STIs)  Get screened for STIs, including gonorrhea and chlamydia, if: ? You are sexually active and are younger than 70 years of age. ? You are older than 70 years of age and your health care provider tells you that you are at risk for this type of infection. ? Your sexual activity  has changed since you were last screened, and you are at increased risk for chlamydia or gonorrhea. Ask your health care provider if you are at risk.  Ask your health care provider about whether you are at high risk for HIV. Your health care provider may recommend a prescription medicine to help prevent HIV infection. If you choose to take medicine to prevent HIV, you should first get tested for HIV. You should then be tested every 3 months for as long as  you are taking the medicine. Pregnancy  If you are about to stop having your period (premenopausal) and you may become pregnant, seek counseling before you get pregnant.  Take 400 to 800 micrograms (mcg) of folic acid every day if you become pregnant.  Ask for birth control (contraception) if you want to prevent pregnancy. Osteoporosis and menopause Osteoporosis is a disease in which the bones lose minerals and strength with aging. This can result in bone fractures. If you are 13 years old or older, or if you are at risk for osteoporosis and fractures, ask your health care provider if you should:  Be screened for bone loss.  Take a calcium or vitamin D supplement to lower your risk of fractures.  Be given hormone replacement therapy (HRT) to treat symptoms of menopause. Follow these instructions at home: Lifestyle  Do not use any products that contain nicotine or tobacco, such as cigarettes, e-cigarettes, and chewing tobacco. If you need help quitting, ask your health care provider.  Do not use street drugs.  Do not share needles.  Ask your health care provider for help if you need support or information about quitting drugs. Alcohol use  Do not drink alcohol if: ? Your health care provider tells you not to drink. ? You are pregnant, may be pregnant, or are planning to become pregnant.  If you drink alcohol: ? Limit how much you use to 0-1 drink a day. ? Limit intake if you are breastfeeding.  Be aware of how much alcohol is in your drink. In the [place] one drink equals one 12 oz bottle of beer (355 mL), one 5 oz glass of wine (148 mL), or one 1 oz glass of hard liquor (44 mL). General instructions  Schedule regular health, dental, and eye exams.  Stay current with your vaccines.  Tell your health care provider if: ? You often feel depressed. ? You have ever been abused or do not feel safe at home. Summary  Adopting a healthy lifestyle and getting preventive care are  important in promoting health and wellness.  Follow your health care provider's instructions about healthy diet, exercising, and getting tested or screened for diseases.  Follow your health care provider's instructions on monitoring your cholesterol and blood pressure. This information is not intended to replace advice given to you by your health care provider. Make sure you discuss any questions you have with your health care provider. Document Revised: 08/11/2018 Document Reviewed: 08/11/2018 Elsevier Patient Education  2020 [employer].
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
## 2020-03-15 NOTE — Assessment & Plan Note (Signed)
Euvolemic today and taking statin, aspirin 81 mg daily.

## 2020-03-15 NOTE — Assessment & Plan Note (Signed)
Uses oxygen all the time at home. Did not bring with her to visit. O2 sats did drop to 70s with ambulation and return to 88% with rest. She is encouraged to take this with her when she leaves home and all the time as well as night time at home.

## 2020-03-15 NOTE — Assessment & Plan Note (Signed)
BP close to goal. Taking amlodipine 10 mg daily and refill as needed. Checking CMP and adjust as needed.

## 2020-03-15 NOTE — Assessment & Plan Note (Signed)
Taking levemir 20 units daily. Checking HgA1c and microalbumin to creatinine ratio. She is not on ACE-I/ARB but is on statin. Foot exam done and reminded about eye exam need yearly.

## 2020-03-15 NOTE — Assessment & Plan Note (Addendum)
Flu shot yearly. Covid-19 complete. Pneumonia complete. Shingrix counseled. Tetanus due 2029. Colonoscopy due declines. Mammogram due declines, pap smear aged out and dexa ordered today. Counseled about sun safety and mole surveillance. Counseled about the dangers of distracted driving. Given 10 year screening recommendations.

## 2020-03-15 NOTE — Assessment & Plan Note (Signed)
Taking lipitor and checking lipid panel and adjust as needed.  

## 2020-03-15 NOTE — Assessment & Plan Note (Signed)
Breathing is overall stable. Using trelegy and this helps some. Is on oxygen chronically.

## 2020-03-29 ENCOUNTER — Ambulatory Visit
Admission: RE | Admit: 2020-03-29 | Discharge: 2020-03-29 | Disposition: A | Discharge status: Home / Self Care | Payer: Medicare Other | Source: Ambulatory Visit | Attending: Internal Medicine | Admitting: Internal Medicine

## 2020-03-29 DIAGNOSIS — E2839 Other primary ovarian failure: Secondary | ICD-10-CM

## 2020-04-03 ENCOUNTER — Telehealth: Payer: Self-pay | Admitting: Pharmacist

## 2020-04-03 NOTE — Progress Notes (Signed)
° ° °  Chronic Care Management °Pharmacy Assistant  ° °A user error has taken place: encounter opened in error, closed for administrative reasons. ° ° ° ° ° ° ° °

## 2020-04-04 ENCOUNTER — Telehealth: Payer: Self-pay

## 2020-04-04 DIAGNOSIS — I10 Essential (primary) hypertension: Secondary | ICD-10-CM

## 2020-04-04 DIAGNOSIS — J449 Chronic obstructive pulmonary disease, unspecified: Secondary | ICD-10-CM

## 2020-04-04 DIAGNOSIS — J9601 Acute respiratory failure with hypoxia: Secondary | ICD-10-CM

## 2020-04-04 MED ORDER — ATORVASTATIN CALCIUM 40 MG PO TABS: 40.0000 mg | ORAL_TABLET | Freq: Every day | ORAL | 3 refills | Status: DC

## 2020-04-04 MED ORDER — AMLODIPINE BESYLATE 10 MG PO TABS: 10.0000 mg | ORAL_TABLET | Freq: Every day | ORAL | 3 refills | Status: DC

## 2020-04-04 MED ORDER — INSULIN DETEMIR 100 UNIT/ML FLEXPEN: 20.0000 [IU] | PEN_INJECTOR | Freq: Every day | SUBCUTANEOUS | 11 refills | Status: DC

## 2020-04-04 MED ORDER — MONTELUKAST SODIUM 10 MG PO TABS: 10.0000 mg | ORAL_TABLET | Freq: Every day | ORAL | 3 refills | Status: DC

## 2020-04-04 NOTE — Telephone Encounter (Signed)
-----   Message from Kathyrn Sheriff, [facility] sent at 04/04/2020  8:55 AM EDT ----- Regarding: Med refills Ms Reckner has requested refills for the following meds to [facility] pharmacy, can you order these? Last office visit with Dr Okey Dupre was 03/13/20.  Amlodipine 10 mg Atorvastatin 40 mg Levemir 20 units daily Montelukast 10 mg

## 2020-04-09 DIAGNOSIS — J188 Other pneumonia, unspecified organism: Secondary | ICD-10-CM | POA: Diagnosis not present

## 2020-04-09 DIAGNOSIS — Z96651 Presence of right artificial knee joint: Secondary | ICD-10-CM | POA: Diagnosis not present

## 2020-04-09 DIAGNOSIS — J441 Chronic obstructive pulmonary disease with (acute) exacerbation: Secondary | ICD-10-CM | POA: Diagnosis not present

## 2020-04-09 DIAGNOSIS — M1990 Unspecified osteoarthritis, unspecified site: Secondary | ICD-10-CM | POA: Diagnosis not present

## 2020-05-10 DIAGNOSIS — M1990 Unspecified osteoarthritis, unspecified site: Secondary | ICD-10-CM | POA: Diagnosis not present

## 2020-05-10 DIAGNOSIS — Z96651 Presence of right artificial knee joint: Secondary | ICD-10-CM | POA: Diagnosis not present

## 2020-05-10 DIAGNOSIS — J441 Chronic obstructive pulmonary disease with (acute) exacerbation: Secondary | ICD-10-CM | POA: Diagnosis not present

## 2020-05-10 DIAGNOSIS — J188 Other pneumonia, unspecified organism: Secondary | ICD-10-CM | POA: Diagnosis not present

## 2020-05-23 ENCOUNTER — Telehealth: Payer: Self-pay | Admitting: Pharmacist

## 2020-05-23 NOTE — Progress Notes (Signed)
Chronic Care Management Pharmacy Assistant   Name: JAQUILLA WOODROOF  MRN: [id number] DOB: 09-12-1950  Reason for Encounter: Medication Review  Patient Questions:  1.  Have you seen any other providers since your last visit? Yes, On 03/13/2020 the patient saw Dr. Sharlet Salina.  2.  Any changes in your medicines or health? No    PCP : Hoyt Koch, MD  Allergies:   Allergies  Allergen Reactions   Fish Allergy Rash and Other (See Comments)    Some types of fish with iodine causes rash   Iodine Rash   Penicillins Rash and Other (See Comments)    Has patient had a PCN reaction causing immediate rash, facial/tongue/throat swelling, SOB or lightheadedness with hypotension: yes Has patient had a PCN reaction causing severe rash involving mucus membranes or skin necrosis: no Has patient had a PCN reaction that required hospitalization: no Has patient had a PCN reaction occurring within the last 10 years: no If all of the above answers are "NO", then may proceed with Cephalosporin use.    Tetracyclines & Related Rash    Medications: Outpatient Encounter Medications as of 05/23/2020  Medication Sig Note   albuterol (PROVENTIL) (2.5 MG/3ML) 0.083% nebulizer solution Take 3 mLs (2.5 mg total) by nebulization 3 (three) times daily.    albuterol (VENTOLIN HFA) 108 (90 Base) MCG/ACT inhaler Inhale 2 puffs into the lungs every 4 (four) hours as needed for wheezing or shortness of breath.    amLODipine (NORVASC) 10 MG tablet Take 1 tablet (10 mg total) by mouth daily.    aspirin EC 81 MG tablet Take 1 tablet (81 mg total) by mouth daily.    atorvastatin (LIPITOR) 40 MG tablet Take 1 tablet (40 mg total) by mouth at bedtime.    blood glucose meter kit and supplies Dispense based on patient and insurance preference. Use up to four times daily as directed. (FOR ICD-10 E10.9, E11.9).    calcium carbonate (CALCIUM 600) 600 MG TABS tablet Take 600 mg by mouth 2 (two) times daily with a  meal.    diphenhydrAMINE (BENADRYL) 25 MG tablet Take 50 mg by mouth at bedtime.    diphenhydramine-acetaminophen (TYLENOL PM) 25-500 MG TABS tablet Take 2 tablets by mouth at bedtime as needed (For pain.).    fluticasone (FLONASE) 50 MCG/ACT nasal spray Place 2 sprays into both nostrils daily.    Fluticasone-Umeclidin-Vilant (TRELEGY ELLIPTA) 100-62.5-25 MCG/INH AEPB Inhale 1 puff into the lungs daily.    insulin detemir (LEVEMIR) 100 UNIT/ML FlexPen Inject 20 Units into the skin daily at 10 pm.    Insulin Pen Needle 31G X 5 MM MISC levemir 20 units at bedtime and novolog as per sliding scale    Melatonin 5 MG TABS Take 5 mg by mouth at bedtime as needed (sleep).     meloxicam (MOBIC) 7.5 MG tablet Take 1 tablet (7.5 mg total) by mouth daily.    montelukast (SINGULAIR) 10 MG tablet Take 1 tablet (10 mg total) by mouth at bedtime.    Multiple Vitamin (MULTIVITAMIN) tablet Take 1 tablet by mouth daily.    nitroGLYCERIN (NITROSTAT) 0.4 MG SL tablet Place 1 tablet (0.4 mg total) under the tongue every 5 (five) minutes as needed for chest pain. 01/30/2017: Patient stated that she has never used this and that her bottle expired so she threw it away.   tiZANidine (ZANAFLEX) 2 MG tablet Take 1 tablet (2 mg total) by mouth 2 (two) times daily as needed for muscle  spasms.    [DISCONTINUED] budesonide (PULMICORT) 0.5 MG/2ML nebulizer solution Take 2 mLs (0.5 mg total) by nebulization 2 (two) times daily. (Patient not taking: Reported on 01/17/2020)    [DISCONTINUED] ipratropium-albuterol (DUONEB) 0.5-2.5 (3) MG/3ML SOLN Take 3 mLs by nebulization every 4 (four) hours as needed (shortness of breath). (Patient not taking: Reported on 01/17/2020)    No facility-administered encounter medications on file as of 05/23/2020.    Current Diagnosis: Patient Active Problem List   Diagnosis Date Noted   Abnormal findings on diagnostic imaging of lung 03/26/2018   Allergic rhinitis 03/26/2018   Chronic  diastolic CHF (congestive heart failure) ([place]) 04/13/2017   Cardiomegaly 04/13/2017   Chronic respiratory failure with hypoxia ([place]) 06/18/2015   Routine general medical examination at a health care facility 02/21/2015   GOLD COPD IV D 07/06/2014   Essential hypertension 04/27/2014   Traumatic arthritis of right knee 04/27/2014   Former tobacco use 03/20/2014   Hyperlipidemia associated with type 2 diabetes mellitus ([place]) 03/20/2014   Left carotid bruit 03/20/2014   Steroid-induced diabetes ([place]) 03/20/2014    Goals Addressed   None     Follow-Up:  Coordination of Enhanced Pharmacy Services     Reviewed chart for medication changes ahead of medication coordination call.  The patient saw Dr. Sharlet Salina on 03/13/2020  No medication changes indicated. BP Readings from Last 3 Encounters:  03/13/20 (!) 154/94  02/21/20 136/74  09/08/19 (!) 162/88    Lab Results  Component Value Date   HGBA1C 7.1 (A) 09/08/2019     Patient obtains medications through Vials  90 Days   Last adherence delivery included:    Levemir Injection-inject 20 units  Amlodipine $RemoveBef'10mg'AjzsjJMzDt$  daily  Atorvastatin $RemoveBefor'40mg'zObCgDCzclTo$  daily Montelukast $RemoveBeforeDEI'10mg'FJhzNZrqvuvxNBoL$  daily  Fluticasone Propionate   Patient is due for next adherence delivery on: 05/28/2020. Called patient and reviewed medications and coordinated delivery.  This delivery to include:  Trelegy Aer 136mcg Ellipta  Pen Needle 31 Gauge X 3/16  Proair Hfa 43mcq/ actuation Aerosol Inhaler   Patient declined the following medications   Levemir Injection-inject 20 units  Amlodipine $RemoveBef'10mg'GkXnqFoXvD$  daily  Atorvastatin $RemoveBefor'40mg'tglQXcRCzIqm$  daily Montelukast $RemoveBeforeDEI'10mg'iHMparVEXtIbcwTh$  daily  Fluticasone Propionate   due to the patient received 90-day supply on  04/04/2020 supplies end on 07/04/2020  Confirmed delivery date of  05/28/2020 , advised patient that pharmacy will contact them the morning of delivery.    Rosendo Gros, [place] Pharmacist Assistant  [phone]

## 2020-06-19 ENCOUNTER — Telehealth: Payer: Medicare Other

## 2020-06-19 NOTE — Chronic Care Management (AMB) (Deleted)
Chronic Care Management Pharmacy  Name: MARTIA DALBY  MRN: [id number] DOB: Feb 23, 1951  Chief Complaint/ HPI  [name],  69 y.o. , female presents for their Follow-Up CCM visit with the clinical pharmacist via telephone due to COVID-19 Pandemic.  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 Working on cleaning out her house still with her sister. Enjoys crafting, wants to get a chihuahua.   Office Visits: 09/08/19 Dr Sharlet Salina OV: multiple steroid courses for COPD triggered worsening DM. A1c 7.1, improved, continue Levemir 20 units but may consider dose decrease if more low sugars.  Consult Visit: 02/21/20 Urgent care: R hip pain after mechanical fall. Gave ketorolac IM and rx'd meloxicam and tizanidine.  05/10/2018 Dr Vaughan Browner (pulmonary): recurrent COPD exacerbations, sx improved on Trelegy,  O2.  Allergies  Allergen Reactions  . Fish Allergy Rash and Other (See Comments)    Some types of fish with iodine causes rash  . Iodine Rash  . Penicillins Rash and Other (See Comments)    Has patient had a PCN reaction causing immediate rash, facial/tongue/throat swelling, SOB or lightheadedness with hypotension: yes Has patient had a PCN reaction causing severe rash involving mucus membranes or skin necrosis: no Has patient had a PCN reaction that required hospitalization: no Has patient had a PCN reaction occurring within the last 10 years: no If all of the above answers are "NO", then may proceed with Cephalosporin use.   . Tetracyclines & Related Rash   Medications: Outpatient Encounter Medications as of 06/19/2020  Medication Sig Note  . albuterol (PROVENTIL) (2.5 MG/3ML) 0.083% nebulizer solution Take 3 mLs (2.5 mg total) by nebulization 3 (three) times daily.   Marland Kitchen albuterol (VENTOLIN HFA) 108 (90 Base) MCG/ACT inhaler Inhale 2 puffs into the lungs every  4 (four) hours as needed for wheezing or shortness of breath.   Marland Kitchen amLODipine (NORVASC) 10 MG tablet Take 1 tablet (10 mg total) by mouth daily.   Marland Kitchen aspirin EC 81 MG tablet Take 1 tablet (81 mg total) by mouth daily.   Marland Kitchen atorvastatin (LIPITOR) 40 MG tablet Take 1 tablet (40 mg total) by mouth at bedtime.   . blood glucose meter kit and supplies Dispense based on patient and insurance preference. Use up to four times daily as directed. (FOR ICD-10 E10.9, E11.9).   . calcium carbonate (CALCIUM 600) 600 MG TABS tablet Take 600 mg by mouth 2 (two) times daily with a meal.   . diphenhydrAMINE (BENADRYL) 25 MG tablet Take 50 mg by mouth at bedtime.   . diphenhydramine-acetaminophen (TYLENOL PM) 25-500 MG TABS tablet Take 2 tablets by mouth at bedtime as needed (For pain.).   Marland Kitchen fluticasone (FLONASE) 50 MCG/ACT nasal spray Place 2 sprays into both nostrils daily.   . Fluticasone-Umeclidin-Vilant (TRELEGY ELLIPTA) 100-62.5-25 MCG/INH AEPB Inhale 1 puff into the lungs daily.   . insulin detemir (LEVEMIR) 100 UNIT/ML FlexPen Inject 20 Units into the skin daily at 10 pm.   . Insulin Pen Needle 31G X 5 MM MISC levemir 20 units at bedtime and novolog as per sliding scale   . Melatonin 5 MG TABS Take 5 mg by mouth at bedtime as needed (sleep).    . meloxicam (MOBIC) 7.5 MG tablet Take 1 tablet (7.5 mg total) by mouth daily.   . montelukast (SINGULAIR) 10 MG tablet Take 1 tablet (10 mg total)  by mouth at bedtime.   . Multiple Vitamin (MULTIVITAMIN) tablet Take 1 tablet by mouth daily.   . nitroGLYCERIN (NITROSTAT) 0.4 MG SL tablet Place 1 tablet (0.4 mg total) under the tongue every 5 (five) minutes as needed for chest pain. 01/30/2017: Patient stated that she has never used this and that her bottle expired so she threw it away.  Marland Kitchen tiZANidine (ZANAFLEX) 2 MG tablet Take 1 tablet (2 mg total) by mouth 2 (two) times daily as needed for muscle spasms.   . [DISCONTINUED] budesonide (PULMICORT) 0.5 MG/2ML nebulizer  solution Take 2 mLs (0.5 mg total) by nebulization 2 (two) times daily. (Patient not taking: Reported on 01/17/2020)   . [DISCONTINUED] ipratropium-albuterol (DUONEB) 0.5-2.5 (3) MG/3ML SOLN Take 3 mLs by nebulization every 4 (four) hours as needed (shortness of breath). (Patient not taking: Reported on 01/17/2020)    No facility-administered encounter medications on file as of 06/19/2020.   Wt Readings from Last 3 Encounters:  03/13/20 180 lb (81.6 kg)  09/08/19 183 lb (83 kg)  03/09/19 181 lb (82.1 kg)   Current Diagnosis/Assessment:    Goals Addressed   None    COPD / Asthma / Tobacco   Last spirometry score: FEV1 32% predicted, FEV1/FVC 0.43  Gold Grade: Gold 3 (FEV1 30-49%) Current COPD Classification:  D (high sx, >/=2 exacerbations/yr)  Eosinophil count:   Lab Results  Component Value Date/Time   EOSPCT 0 02/05/2018 05:56 AM  %                               Eos (Absolute):  Lab Results  Component Value Date/Time   EOSABS 0.0 02/05/2018 05:56 AM   Tobacco Status:  Social History   Tobacco Use  Smoking Status Former Smoker  . Packs/day: 1.00  . Years: 40.00  . Pack years: 40.00  . Types: Cigarettes  . Start date: 09/01/1973  . Quit date: 03/19/2018  . Years since quitting: 2.2  Smokeless Tobacco Never Used    Patient has failed these meds in past: Trelegy Patient is currently controlled on the following medications:   Trelegy 1 puff daily  Albuterol HFA PRN  Albuterol neb PRN  Montelukast 10 mg daily HS  Using maintenance inhaler regularly? Yes Frequency of rescue inhaler use:  several times per month  We discussed:  Patient has received Trelegy and Proair via [facility]; she has not started Trelegy yet because she is waiting until she finishes Anoro to avoid wasting medication.  Plan  Continue current medications  Heart Failure/Hypertension   Type: Diastolic  Last ejection fraction: 60-65% (02/04/2018) NYHA Class: I (no actitivty  limitation) AHA HF Stage: A (HF risk factors present)  BP goal < 140/90  Office blood pressures are  BP Readings from Last 3 Encounters:  03/13/20 (!) 154/94  02/21/20 136/74  09/08/19 (!) 162/88   Patient checks BP at home infrequently   Patient home BP readings are ranging: 120/70-80  Patient has failed these meds in past: n/a Patient is currently controlled on the following medications:   Amlodipine 10 mg daily  We discussed diet and exercise extensively; pt endorses white coat hypertension, and per reported home BP this is likely.   Plan  Continue current medications and control with diet and exercise  Hyperlipidemia   LDL goal < 100  Lipid Panel     Component Value Date/Time   CHOL 158 03/09/2019 0842   TRIG 103.0 03/09/2019 0842  HDL 40.60 03/09/2019 0842   CHOLHDL 4 03/09/2019 0842   VLDL 20.6 03/09/2019 0842   LDLCALC 97 03/09/2019 0842    The 10-year ASCVD risk score Mikey Bussing DC Jr., et al., 2013) is: 31.2%   Values used to calculate the score:     Age: 36 years     Sex: Female     Is Non-Hispanic African American: No     Diabetic: Yes     Tobacco smoker: No     Systolic Blood Pressure: 027 mmHg     Is BP treated: Yes     HDL Cholesterol: 40.6 mg/dL     Total Cholesterol: 158 mg/dL   Patient has failed these meds in past: n/a Patient is currently controlled on the following medications:   Atorvastatin 40 mg HS   Aspirin 81 mg daily  We discussed:  diet and exercise extensively; cholesterol goals and benefits of statin. Pt asked if she still needs aspirin, discussed benefits/risks for primary prevention, given hx of diabetes and ASCVD risk > 20% patient likely still benefits from aspirin.  Plan  Continue current medications and control with diet and exercise  Diabetes   A1c goal < 8%  Recent Relevant Labs: Lab Results  Component Value Date/Time   HGBA1C 7.1 (A) 09/08/2019 01:26 PM   HGBA1C 7.9 (H) 03/09/2019 08:42 AM   HGBA1C 7.0 (H)  02/05/2018 05:56 AM   GFR 116.90 03/09/2019 08:42 AM   GFR 100.69 07/02/2015 11:26 AM    Checking BG: not checking currently due to broken meter  Recent FBG Readings: n/a  Patient has failed these meds in past: glipizide, Humalog Patient is currently controlled on the following medications:   Levemir 20 units HS  Last diabetic Eye exam: No results found for: HMDIABEYEEXA  Last diabetic Foot exam: No results found for: HMDIABFOOTEX   We discussed: pt has received new glucometer and testing supplies via [facility]; she has used it a few times but not consistently; emphasized importance of checking BG daily especially while using insulin to avoid hypoglycemia. Pt reports her home health nurse has checked her A1c last week and she thinks it was 7.6%. Pt has f/u with PCP tomorrow, will defer changes.  Plan  Continue current medications and control with diet and exercise    Medication Management   Pt uses [facility] pharmacy for all medications Pt endorses 99% compliance  We discussed: Reviewed patient's [facility] medication and Epic medication profile assuring there are no discrepancies or gaps in therapy. Confirmed all fill dates appropriate and verified with patient that there is a sufficient quantity of all prescribed medications at home. Informed patient to call me any time if needing medications before scheduled deliveries.     Plan  Utilize [facility] pharmacy for medication synchronization, packaging and delivery    Follow up: 3 month phone visit  Charlene Brooke, PharmD Clinical Pharmacist [facility] [phone]

## 2020-06-22 ENCOUNTER — Telehealth: Payer: Self-pay | Admitting: Pharmacist

## 2020-06-22 NOTE — Progress Notes (Signed)
Chronic Care Management Pharmacy Assistant   Name: Yvonne Todd  MRN: [id number] DOB: [birth year]  Reason for Encounter: Medication Review  Patient Questions:  1.  Have you seen any other providers since your last visit? No  2.  Any changes in your medicines or health? No   PCP : Hoyt Koch, MD  Allergies:   Allergies  Allergen Reactions   Fish Allergy Rash and Other (See Comments)    Some types of fish with iodine causes rash   Iodine Rash   Penicillins Rash and Other (See Comments)    Has patient had a PCN reaction causing immediate rash, facial/tongue/throat swelling, SOB or lightheadedness with hypotension: yes Has patient had a PCN reaction causing severe rash involving mucus membranes or skin necrosis: no Has patient had a PCN reaction that required hospitalization: no Has patient had a PCN reaction occurring within the last 10 years: no If all of the above answers are "NO", then may proceed with Cephalosporin use.    Tetracyclines & Related Rash    Medications: Outpatient Encounter Medications as of 06/22/2020  Medication Sig Note   albuterol (PROVENTIL) (2.5 MG/3ML) 0.083% nebulizer solution Take 3 mLs (2.5 mg total) by nebulization 3 (three) times daily.    albuterol (VENTOLIN HFA) 108 (90 Base) MCG/ACT inhaler Inhale 2 puffs into the lungs every 4 (four) hours as needed for wheezing or shortness of breath.    amLODipine (NORVASC) 10 MG tablet Take 1 tablet (10 mg total) by mouth daily.    aspirin EC 81 MG tablet Take 1 tablet (81 mg total) by mouth daily.    atorvastatin (LIPITOR) 40 MG tablet Take 1 tablet (40 mg total) by mouth at bedtime.    blood glucose meter kit and supplies Dispense based on patient and insurance preference. Use up to four times daily as directed. (FOR ICD-10 E10.9, E11.9).    calcium carbonate (CALCIUM 600) 600 MG TABS tablet Take 600 mg by mouth 2 (two) times daily with a meal.    diphenhydrAMINE (BENADRYL) 25 MG  tablet Take 50 mg by mouth at bedtime.    diphenhydramine-acetaminophen (TYLENOL PM) 25-500 MG TABS tablet Take 2 tablets by mouth at bedtime as needed (For pain.).    fluticasone (FLONASE) 50 MCG/ACT nasal spray Place 2 sprays into both nostrils daily.    Fluticasone-Umeclidin-Vilant (TRELEGY ELLIPTA) 100-62.5-25 MCG/INH AEPB Inhale 1 puff into the lungs daily.    insulin detemir (LEVEMIR) 100 UNIT/ML FlexPen Inject 20 Units into the skin daily at 10 pm.    Insulin Pen Needle 31G X 5 MM MISC levemir 20 units at bedtime and novolog as per sliding scale    Melatonin 5 MG TABS Take 5 mg by mouth at bedtime as needed (sleep).     meloxicam (MOBIC) 7.5 MG tablet Take 1 tablet (7.5 mg total) by mouth daily.    montelukast (SINGULAIR) 10 MG tablet Take 1 tablet (10 mg total) by mouth at bedtime.    Multiple Vitamin (MULTIVITAMIN) tablet Take 1 tablet by mouth daily.    nitroGLYCERIN (NITROSTAT) 0.4 MG SL tablet Place 1 tablet (0.4 mg total) under the tongue every 5 (five) minutes as needed for chest pain. 01/30/2017: Patient stated that she has never used this and that her bottle expired so she threw it away.   tiZANidine (ZANAFLEX) 2 MG tablet Take 1 tablet (2 mg total) by mouth 2 (two) times daily as needed for muscle spasms.    [DISCONTINUED] budesonide (PULMICORT) 0.5  MG/2ML nebulizer solution Take 2 mLs (0.5 mg total) by nebulization 2 (two) times daily. (Patient not taking: Reported on 01/17/2020)    [DISCONTINUED] ipratropium-albuterol (DUONEB) 0.5-2.5 (3) MG/3ML SOLN Take 3 mLs by nebulization every 4 (four) hours as needed (shortness of breath). (Patient not taking: Reported on 01/17/2020)    No facility-administered encounter medications on file as of 06/22/2020.    Current Diagnosis: Patient Active Problem List   Diagnosis Date Noted   Abnormal findings on diagnostic imaging of lung 03/26/2018   Allergic rhinitis 03/26/2018   Chronic diastolic CHF (congestive heart failure)  ([place]) 04/13/2017   Cardiomegaly 04/13/2017   Chronic respiratory failure with hypoxia ([place]) 06/18/2015   Routine general medical examination at a health care facility 02/21/2015   GOLD COPD IV D 07/06/2014   Essential hypertension 04/27/2014   Traumatic arthritis of right knee 04/27/2014   Former tobacco use 03/20/2014   Hyperlipidemia associated with type 2 diabetes mellitus ([place]) 03/20/2014   Left carotid bruit 03/20/2014   Steroid-induced diabetes ([place]) 03/20/2014    Goals Addressed   None     Follow-Up:  Coordination of Enhanced Pharmacy Services   Reviewed chart for medication changes ahead of medication coordination call.  No OVs, Consults, or hospital visits since last care coordination call/Pharmacist visit.  No medication changes indicated  BP Readings from Last 3 Encounters:  03/13/20 (!) 154/94  02/21/20 136/74  09/08/19 (!) 162/88    Lab Results  Component Value Date   HGBA1C 7.1 (A) 09/08/2019     Patient obtains medications through Vials  90 Days   Last adherence delivery included:  Amlodipine 48m daily Atorvastatin 444mdaily Montelukast 1071maily  Fluticasone Propionate 59m82mwo sprays in each nostril daily  Trelegy Aer 100mc44mlipta one puff daily   Patient is due for next adherence delivery on: 07/03/2020. Called patient and reviewed medications and coordinated delivery.  This delivery to include:  Amlodipine 10mg 18my Atorvastatin 40mg d51m Montelukast 10mg da48m Fluticasone Propionate 59mcg tw60mrays in each nostril daily  Trelegy Aer 100mcg Ell16m one puff daily   Confirmed delivery date of 07/03/2020, advised patient that pharmacy will contact them the morning of delivery.    Deven CrosRosendo GrosraEdinburg Regional Medical Center Team Manager/ CPA (Clinical Pharmacist Assistant) [phone]

## 2020-07-02 DIAGNOSIS — M1990 Unspecified osteoarthritis, unspecified site: Secondary | ICD-10-CM | POA: Diagnosis not present

## 2020-07-02 DIAGNOSIS — Z96651 Presence of right artificial knee joint: Secondary | ICD-10-CM | POA: Diagnosis not present

## 2020-07-02 DIAGNOSIS — J441 Chronic obstructive pulmonary disease with (acute) exacerbation: Secondary | ICD-10-CM | POA: Diagnosis not present

## 2020-07-24 ENCOUNTER — Other Ambulatory Visit: Payer: Self-pay | Admitting: Internal Medicine

## 2020-07-24 ENCOUNTER — Telehealth: Payer: Self-pay | Admitting: Pharmacist

## 2020-07-24 NOTE — Progress Notes (Signed)
Chronic Care Management Pharmacy Assistant   Name: YEN WANDELL  MRN: [id number] DOB: 02-28-1951  Reason for Encounter: Medication Review  PCP : Hoyt Koch, MD  Allergies:   Allergies  Allergen Reactions  . Fish Allergy Rash and Other (See Comments)    Some types of fish with iodine causes rash  . Iodine Rash  . Penicillins Rash and Other (See Comments)    Has patient had a PCN reaction causing immediate rash, facial/tongue/throat swelling, SOB or lightheadedness with hypotension: yes Has patient had a PCN reaction causing severe rash involving mucus membranes or skin necrosis: no Has patient had a PCN reaction that required hospitalization: no Has patient had a PCN reaction occurring within the last 10 years: no If all of the above answers are "NO", then may proceed with Cephalosporin use.   . Tetracyclines & Related Rash    Medications: Outpatient Encounter Medications as of 07/24/2020  Medication Sig Note  . albuterol (PROVENTIL) (2.5 MG/3ML) 0.083% nebulizer solution Take 3 mLs (2.5 mg total) by nebulization 3 (three) times daily.   Marland Kitchen albuterol (VENTOLIN HFA) 108 (90 Base) MCG/ACT inhaler Inhale 2 puffs into the lungs every 4 (four) hours as needed for wheezing or shortness of breath.   Marland Kitchen amLODipine (NORVASC) 10 MG tablet Take 1 tablet (10 mg total) by mouth daily.   Marland Kitchen aspirin EC 81 MG tablet Take 1 tablet (81 mg total) by mouth daily.   Marland Kitchen atorvastatin (LIPITOR) 40 MG tablet Take 1 tablet (40 mg total) by mouth at bedtime.   . blood glucose meter kit and supplies Dispense based on patient and insurance preference. Use up to four times daily as directed. (FOR ICD-10 E10.9, E11.9).   . calcium carbonate (CALCIUM 600) 600 MG TABS tablet Take 600 mg by mouth 2 (two) times daily with a meal.   . diphenhydrAMINE (BENADRYL) 25 MG tablet Take 50 mg by mouth at bedtime.   . diphenhydramine-acetaminophen (TYLENOL PM) 25-500 MG TABS tablet Take 2 tablets by mouth at  bedtime as needed (For pain.).   Marland Kitchen fluticasone (FLONASE) 50 MCG/ACT nasal spray Place 2 sprays into both nostrils daily.   . Fluticasone-Umeclidin-Vilant (TRELEGY ELLIPTA) 100-62.5-25 MCG/INH AEPB Inhale 1 puff into the lungs daily.   . insulin detemir (LEVEMIR) 100 UNIT/ML FlexPen Inject 20 Units into the skin daily at 10 pm.   . Insulin Pen Needle 31G X 5 MM MISC levemir 20 units at bedtime and novolog as per sliding scale   . Melatonin 5 MG TABS Take 5 mg by mouth at bedtime as needed (sleep).    . meloxicam (MOBIC) 7.5 MG tablet Take 1 tablet (7.5 mg total) by mouth daily.   . montelukast (SINGULAIR) 10 MG tablet Take 1 tablet (10 mg total) by mouth at bedtime.   . Multiple Vitamin (MULTIVITAMIN) tablet Take 1 tablet by mouth daily.   . nitroGLYCERIN (NITROSTAT) 0.4 MG SL tablet Place 1 tablet (0.4 mg total) under the tongue every 5 (five) minutes as needed for chest pain. 01/30/2017: Patient stated that she has never used this and that her bottle expired so she threw it away.  Marland Kitchen tiZANidine (ZANAFLEX) 2 MG tablet Take 1 tablet (2 mg total) by mouth 2 (two) times daily as needed for muscle spasms.   . [DISCONTINUED] budesonide (PULMICORT) 0.5 MG/2ML nebulizer solution Take 2 mLs (0.5 mg total) by nebulization 2 (two) times daily. (Patient not taking: Reported on 01/17/2020)   . [DISCONTINUED] ipratropium-albuterol (DUONEB) 0.5-2.5 (3)  MG/3ML SOLN Take 3 mLs by nebulization every 4 (four) hours as needed (shortness of breath). (Patient not taking: Reported on 01/17/2020)    No facility-administered encounter medications on file as of 07/24/2020.    Current Diagnosis: Patient Active Problem List   Diagnosis Date Noted  . Abnormal findings on diagnostic imaging of lung 03/26/2018  . Allergic rhinitis 03/26/2018  . Chronic diastolic CHF (congestive heart failure) (HCC) 04/13/2017  . Cardiomegaly 04/13/2017  . Chronic respiratory failure with hypoxia (HCC) 06/18/2015  . Routine general medical  examination at a health care facility 02/21/2015  . GOLD COPD IV D 07/06/2014  . Essential hypertension 04/27/2014  . Traumatic arthritis of right knee 04/27/2014  . Former tobacco use 03/20/2014  . Hyperlipidemia associated with type 2 diabetes mellitus (HCC) 03/20/2014  . Left carotid bruit 03/20/2014  . 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  Patient requested an acute fill for Pen needles 31 Gauge  to be delivered: 07/30/2020 Pharmacy needs refills? Yes  Confirmed delivery date of 07/30/2020, advised patient that pharmacy will contact them the morning of delivery.  Deven Crosby, NRCMA  Practice Team Manager/ CPA (Clinical Pharmacist Assistant) [phone]                  

## 2020-07-31 ENCOUNTER — Telehealth: Payer: Self-pay | Admitting: Pharmacist

## 2020-07-31 NOTE — Progress Notes (Addendum)
  Chart review for Chronic Care Management  Reviewed chart and insurance data for medication adherence. Patient does not have any gaps in adherence and is not in danger of failing any Medicare adherence measures. No further action required.  Tracy Ellis, Clinical Pharmacist Assistant [facility] 

## 2020-08-09 DIAGNOSIS — Z96651 Presence of right artificial knee joint: Secondary | ICD-10-CM | POA: Diagnosis not present

## 2020-08-09 DIAGNOSIS — M1990 Unspecified osteoarthritis, unspecified site: Secondary | ICD-10-CM | POA: Diagnosis not present

## 2020-08-09 DIAGNOSIS — J441 Chronic obstructive pulmonary disease with (acute) exacerbation: Secondary | ICD-10-CM | POA: Diagnosis not present

## 2020-08-22 ENCOUNTER — Telehealth: Payer: Self-pay | Admitting: Pharmacist

## 2020-08-22 NOTE — Progress Notes (Signed)
Chronic Care Management Pharmacy Assistant   Name: LORANE COUSAR  MRN: [id number] DOB: [birth year]  Reason for Encounter: Medication Review   PCP : Hoyt Koch, MD  Allergies:   Allergies  Allergen Reactions  . Fish Allergy Rash and Other (See Comments)    Some types of fish with iodine causes rash  . Iodine Rash  . Penicillins Rash and Other (See Comments)    Has patient had a PCN reaction causing immediate rash, facial/tongue/throat swelling, SOB or lightheadedness with hypotension: yes Has patient had a PCN reaction causing severe rash involving mucus membranes or skin necrosis: no Has patient had a PCN reaction that required hospitalization: no Has patient had a PCN reaction occurring within the last 10 years: no If all of the above answers are "NO", then may proceed with Cephalosporin use.   . Tetracyclines & Related Rash    Medications: Outpatient Encounter Medications as of 08/22/2020  Medication Sig Note  . albuterol (PROVENTIL) (2.5 MG/3ML) 0.083% nebulizer solution Take 3 mLs (2.5 mg total) by nebulization 3 (three) times daily.   Marland Kitchen albuterol (VENTOLIN HFA) 108 (90 Base) MCG/ACT inhaler Inhale 2 puffs into the lungs every 4 (four) hours as needed for wheezing or shortness of breath.   Marland Kitchen amLODipine (NORVASC) 10 MG tablet Take 1 tablet (10 mg total) by mouth daily.   Marland Kitchen aspirin EC 81 MG tablet Take 1 tablet (81 mg total) by mouth daily.   Marland Kitchen atorvastatin (LIPITOR) 40 MG tablet Take 1 tablet (40 mg total) by mouth at bedtime.   . blood glucose meter kit and supplies Dispense based on patient and insurance preference. Use up to four times daily as directed. (FOR ICD-10 E10.9, E11.9).   . calcium carbonate (CALCIUM 600) 600 MG TABS tablet Take 600 mg by mouth 2 (two) times daily with a meal.   . diphenhydrAMINE (BENADRYL) 25 MG tablet Take 50 mg by mouth at bedtime.   . diphenhydramine-acetaminophen (TYLENOL PM) 25-500 MG TABS tablet Take 2 tablets by mouth at  bedtime as needed (For pain.).   Marland Kitchen fluticasone (FLONASE) 50 MCG/ACT nasal spray Place 2 sprays into both nostrils daily.   . Fluticasone-Umeclidin-Vilant (TRELEGY ELLIPTA) 100-62.5-25 MCG/INH AEPB Inhale 1 puff into the lungs daily.   . insulin detemir (LEVEMIR) 100 UNIT/ML FlexPen Inject 20 Units into the skin daily at 10 pm.   . Melatonin 5 MG TABS Take 5 mg by mouth at bedtime as needed (sleep).    . meloxicam (MOBIC) 7.5 MG tablet Take 1 tablet (7.5 mg total) by mouth daily.   . montelukast (SINGULAIR) 10 MG tablet Take 1 tablet (10 mg total) by mouth at bedtime.   . Multiple Vitamin (MULTIVITAMIN) tablet Take 1 tablet by mouth daily.   . nitroGLYCERIN (NITROSTAT) 0.4 MG SL tablet Place 1 tablet (0.4 mg total) under the tongue every 5 (five) minutes as needed for chest pain. 01/30/2017: Patient stated that she has never used this and that her bottle expired so she threw it away.  Marland Kitchen tiZANidine (ZANAFLEX) 2 MG tablet Take 1 tablet (2 mg total) by mouth 2 (two) times daily as needed for muscle spasms.   . TRUEPLUS PEN NEEDLES 31G X 5 MM MISC USE TO INJECT 20 UNITS of levemir AT BEDTIME AND novolog as PER sliding scale   . [DISCONTINUED] budesonide (PULMICORT) 0.5 MG/2ML nebulizer solution Take 2 mLs (0.5 mg total) by nebulization 2 (two) times daily. (Patient not taking: Reported on 01/17/2020)   . [  DISCONTINUED] ipratropium-albuterol (DUONEB) 0.5-2.5 (3) MG/3ML SOLN Take 3 mLs by nebulization every 4 (four) hours as needed (shortness of breath). (Patient not taking: Reported on 01/17/2020)    No facility-administered encounter medications on file as of 08/22/2020.    Current Diagnosis: Patient Active Problem List   Diagnosis Date Noted  . Abnormal findings on diagnostic imaging of lung 03/26/2018  . Allergic rhinitis 03/26/2018  . Chronic diastolic CHF (congestive heart failure) ([place]) 04/13/2017  . Cardiomegaly 04/13/2017  . Chronic respiratory failure with hypoxia ([place]) 06/18/2015  . Routine  general medical examination at a health care facility 02/21/2015  . GOLD COPD IV D 07/06/2014  . Essential hypertension 04/27/2014  . Traumatic arthritis of right knee 04/27/2014  . Former tobacco use 03/20/2014  . Hyperlipidemia associated with type 2 diabetes mellitus ([place]) 03/20/2014  . Left carotid bruit 03/20/2014  . Steroid-induced diabetes ([place]) 03/20/2014    Goals Addressed   None     Follow-Up:  Pharmacist Review   Reviewed chart for medication changes ahead of medication coordination call.  No OVs, Consults, or hospital visits since last care coordination call / Pharmacist visit. No medication changes indicated  BP Readings from Last 3 Encounters:  03/13/20 (!) 154/94  02/21/20 136/74  09/08/19 (!) 162/88    Lab Results  Component Value Date   HGBA1C 7.1 (A) 09/08/2019     Patient obtains medications through Vials  90 Days   Last adherence delivery included (date:07/02/2020): Amlodipine 62m daily  Atorvastatin 461mdaily  Fluticasone Propionate 5041m Montelukast 46m39mrelegy Aer 100mc104mrueplus Pen needle 31 gauge x 3/16  Patient declined Amlodipine 46mg 67my  Atorvastatin 40mg d72m  Fluticasone Propionate 50mcg  35melukast 46mg  Tr58my Aer 100mcg  Tr4mus Pen needle 31 gauge x 3/16  last delivery due to: Additional supply on hand   Patient is due for next adherence delivery on: 09/30/2020. Called patient and reviewed medication needs.  Patient declined need for refills at this time. Patient is aware of how to contact pharmacist if refills are needed prior to next adherence delivery.   Deven CrosRosendo [facility] Team Manager/ CPA (Clinical Pharmacist Assistant) [phone]

## 2020-09-09 DIAGNOSIS — J441 Chronic obstructive pulmonary disease with (acute) exacerbation: Secondary | ICD-10-CM | POA: Diagnosis not present

## 2020-09-09 DIAGNOSIS — M1990 Unspecified osteoarthritis, unspecified site: Secondary | ICD-10-CM | POA: Diagnosis not present

## 2020-09-09 DIAGNOSIS — Z96651 Presence of right artificial knee joint: Secondary | ICD-10-CM | POA: Diagnosis not present

## 2020-09-28 ENCOUNTER — Telehealth: Payer: Self-pay | Admitting: Pharmacist

## 2020-09-28 NOTE — Progress Notes (Signed)
  Chronic Care Management Pharmacy Assistant   Name: Yvonne Todd  MRN: [id number] DOB: 06/27/1951  Reason for Encounter: Medication Review  Patient Questions:  1.  Have you seen any other providers since your last visit? No  2.  Any changes in your medicines or health? No   PCP : Crawford, Elizabeth A, MD  Allergies:   Allergies  Allergen Reactions  . Fish Allergy Rash and Other (See Comments)    Some types of fish with iodine causes rash  . Iodine Rash  . Penicillins Rash and Other (See Comments)    Has patient had a PCN reaction causing immediate rash, facial/tongue/throat swelling, SOB or lightheadedness with hypotension: yes Has patient had a PCN reaction causing severe rash involving mucus membranes or skin necrosis: no Has patient had a PCN reaction that required hospitalization: no Has patient had a PCN reaction occurring within the last 10 years: no If all of the above answers are "NO", then may proceed with Cephalosporin use.   . Tetracyclines & Related Rash    Medications: Outpatient Encounter Medications as of 09/28/2020  Medication Sig Note  . albuterol (PROVENTIL) (2.5 MG/3ML) 0.083% nebulizer solution Take 3 mLs (2.5 mg total) by nebulization 3 (three) times daily.   . albuterol (VENTOLIN HFA) 108 (90 Base) MCG/ACT inhaler Inhale 2 puffs into the lungs every 4 (four) hours as needed for wheezing or shortness of breath.   . amLODipine (NORVASC) 10 MG tablet Take 1 tablet (10 mg total) by mouth daily.   . aspirin EC 81 MG tablet Take 1 tablet (81 mg total) by mouth daily.   . atorvastatin (LIPITOR) 40 MG tablet Take 1 tablet (40 mg total) by mouth at bedtime.   . blood glucose meter kit and supplies Dispense based on patient and insurance preference. Use up to four times daily as directed. (FOR ICD-10 E10.9, E11.9).   . calcium carbonate (CALCIUM 600) 600 MG TABS tablet Take 600 mg by mouth 2 (two) times daily with a meal.   . diphenhydrAMINE (BENADRYL) 25 MG  tablet Take 50 mg by mouth at bedtime.   . diphenhydramine-acetaminophen (TYLENOL PM) 25-500 MG TABS tablet Take 2 tablets by mouth at bedtime as needed (For pain.).   . fluticasone (FLONASE) 50 MCG/ACT nasal spray Place 2 sprays into both nostrils daily.   . Fluticasone-Umeclidin-Vilant (TRELEGY ELLIPTA) 100-62.5-25 MCG/INH AEPB Inhale 1 puff into the lungs daily.   . insulin detemir (LEVEMIR) 100 UNIT/ML FlexPen Inject 20 Units into the skin daily at 10 pm.   . Melatonin 5 MG TABS Take 5 mg by mouth at bedtime as needed (sleep).    . meloxicam (MOBIC) 7.5 MG tablet Take 1 tablet (7.5 mg total) by mouth daily.   . montelukast (SINGULAIR) 10 MG tablet Take 1 tablet (10 mg total) by mouth at bedtime.   . Multiple Vitamin (MULTIVITAMIN) tablet Take 1 tablet by mouth daily.   . nitroGLYCERIN (NITROSTAT) 0.4 MG SL tablet Place 1 tablet (0.4 mg total) under the tongue every 5 (five) minutes as needed for chest pain. 01/30/2017: Patient stated that she has never used this and that her bottle expired so she threw it away.  . tiZANidine (ZANAFLEX) 2 MG tablet Take 1 tablet (2 mg total) by mouth 2 (two) times daily as needed for muscle spasms.   . TRUEPLUS PEN NEEDLES 31G X 5 MM MISC USE TO INJECT 20 UNITS of levemir AT BEDTIME AND novolog as PER sliding scale   . [  DISCONTINUED] budesonide (PULMICORT) 0.5 MG/2ML nebulizer solution Take 2 mLs (0.5 mg total) by nebulization 2 (two) times daily. (Patient not taking: Reported on 01/17/2020)   . [DISCONTINUED] ipratropium-albuterol (DUONEB) 0.5-2.5 (3) MG/3ML SOLN Take 3 mLs by nebulization every 4 (four) hours as needed (shortness of breath). (Patient not taking: Reported on 01/17/2020)    No facility-administered encounter medications on file as of 09/28/2020.    Current Diagnosis: Patient Active Problem List   Diagnosis Date Noted  . Abnormal findings on diagnostic imaging of lung 03/26/2018  . Allergic rhinitis 03/26/2018  . Chronic diastolic CHF (congestive  heart failure) ([place]) 04/13/2017  . Cardiomegaly 04/13/2017  . Chronic respiratory failure with hypoxia ([place]) 06/18/2015  . Routine general medical examination at a health care facility 02/21/2015  . GOLD COPD IV D 07/06/2014  . Essential hypertension 04/27/2014  . Traumatic arthritis of right knee 04/27/2014  . Former tobacco use 03/20/2014  . Hyperlipidemia associated with type 2 diabetes mellitus ([place]) 03/20/2014  . Left carotid bruit 03/20/2014  . Steroid-induced diabetes ([place]) 03/20/2014    Goals Addressed   None     Follow-Up:  Coordination of Enhanced Pharmacy Services   Reviewed chart for medication changes ahead of medication coordination call.  No OVs, Consults, or hospital visits since last care coordination call/Pharmacist visit.  No medication changes indicated.  BP Readings from Last 3 Encounters:  03/13/20 (!) 154/94  02/21/20 136/74  09/08/19 (!) 162/88    Lab Results  Component Value Date   HGBA1C 7.1 (A) 09/08/2019     Patient obtains medications through Vials  90 Days   Last adherence delivery included:   TruePlus pen needles 53m Amlodipine 168m-daily  Atorvastatin 4037maily  Montelukast Sodium 18m78mily Trelegy Aer 100mc92me puff daily  Fluticasone Spray two sprays daily  Albuterol Aer HFA two puffs every 4 hours as needed  Patient is due for next adherence delivery on: 10/01/2020 Called patient and reviewed medications and coordinated delivery.  This delivery to include: TruePlus pen needles 5ml A2mdipine 18mg -49my  Atorvastatin 40mg da52m Montelukast Sodium 18mg dai80mrelegy Aer 100mcg one61mf daily  Fluticasone Spray two sprays daily  Albuterol Aer HFA two puffs every 4 hours as needed Benadryl Allergy  Confirmed delivery date of 10/01/2020, advised patient that pharmacy will contact them the morning of delivery.   Deven CrosRosendo [facility] Team Manager/ CPA (Clinical Pharmacist Assistant) [phone]

## 2020-10-08 ENCOUNTER — Other Ambulatory Visit: Payer: Self-pay | Admitting: Internal Medicine

## 2020-10-08 DIAGNOSIS — J9601 Acute respiratory failure with hypoxia: Secondary | ICD-10-CM

## 2020-10-10 DIAGNOSIS — M1990 Unspecified osteoarthritis, unspecified site: Secondary | ICD-10-CM | POA: Diagnosis not present

## 2020-10-10 DIAGNOSIS — Z96651 Presence of right artificial knee joint: Secondary | ICD-10-CM | POA: Diagnosis not present

## 2020-10-10 DIAGNOSIS — J441 Chronic obstructive pulmonary disease with (acute) exacerbation: Secondary | ICD-10-CM | POA: Diagnosis not present

## 2020-10-23 ENCOUNTER — Telehealth: Payer: Self-pay | Admitting: Pharmacist

## 2020-10-30 NOTE — Progress Notes (Signed)
Called patient to see if we could go over medications for delivery. Attempts made to patient, no answer.     Horald Pollen, C{A [facility] [phone]

## 2020-11-07 DIAGNOSIS — J441 Chronic obstructive pulmonary disease with (acute) exacerbation: Secondary | ICD-10-CM | POA: Diagnosis not present

## 2020-11-07 DIAGNOSIS — M1990 Unspecified osteoarthritis, unspecified site: Secondary | ICD-10-CM | POA: Diagnosis not present

## 2020-11-07 DIAGNOSIS — Z96651 Presence of right artificial knee joint: Secondary | ICD-10-CM | POA: Diagnosis not present

## 2020-11-28 ENCOUNTER — Telehealth: Payer: Self-pay | Admitting: Pharmacist

## 2020-11-28 NOTE — Progress Notes (Addendum)
Chronic Care Management Pharmacy Assistant   Name: LENER VENTRESCA  MRN: [id number] DOB: Apr 22, 1951   Reason for Encounter: Medication Coordination Call    Recent office visits:  None ID  Recent consult visits:  None ID  Hospital visits:  None in previous 6 months  Medications: Outpatient Encounter Medications as of 11/28/2020  Medication Sig Note   albuterol (PROVENTIL) (2.5 MG/3ML) 0.083% nebulizer solution Take 3 mLs (2.5 mg total) by nebulization 3 (three) times daily.    albuterol (VENTOLIN HFA) 108 (90 Base) MCG/ACT inhaler INHALE TWO PUFFS INTO THE LUNGS EVERY 4 HOURS AS NEEDED FOR SHORTNESS OF BREATH OR WHEEZING    amLODipine (NORVASC) 10 MG tablet Take 1 tablet (10 mg total) by mouth daily.    aspirin EC 81 MG tablet Take 1 tablet (81 mg total) by mouth daily.    atorvastatin (LIPITOR) 40 MG tablet Take 1 tablet (40 mg total) by mouth at bedtime.    blood glucose meter kit and supplies Dispense based on patient and insurance preference. Use up to four times daily as directed. (FOR ICD-10 E10.9, E11.9).    calcium carbonate (CALCIUM 600) 600 MG TABS tablet Take 600 mg by mouth 2 (two) times daily with a meal.    diphenhydrAMINE (BENADRYL) 25 MG tablet Take 50 mg by mouth at bedtime.    diphenhydramine-acetaminophen (TYLENOL PM) 25-500 MG TABS tablet Take 2 tablets by mouth at bedtime as needed (For pain.).    fluticasone (FLONASE) 50 MCG/ACT nasal spray Place 2 sprays into both nostrils daily.    Fluticasone-Umeclidin-Vilant (TRELEGY ELLIPTA) 100-62.5-25 MCG/INH AEPB Inhale 1 puff into the lungs daily.    insulin detemir (LEVEMIR) 100 UNIT/ML FlexPen Inject 20 Units into the skin daily at 10 pm.    Melatonin 5 MG TABS Take 5 mg by mouth at bedtime as needed (sleep).     meloxicam (MOBIC) 7.5 MG tablet Take 1 tablet (7.5 mg total) by mouth daily.    montelukast (SINGULAIR) 10 MG tablet Take 1 tablet (10 mg total) by mouth at bedtime.    Multiple Vitamin (MULTIVITAMIN)  tablet Take 1 tablet by mouth daily.    nitroGLYCERIN (NITROSTAT) 0.4 MG SL tablet Place 1 tablet (0.4 mg total) under the tongue every 5 (five) minutes as needed for chest pain. 01/30/2017: Patient stated that she has never used this and that her bottle expired so she threw it away.   tiZANidine (ZANAFLEX) 2 MG tablet Take 1 tablet (2 mg total) by mouth 2 (two) times daily as needed for muscle spasms.    TRUEPLUS PEN NEEDLES 31G X 5 MM MISC USE TO INJECT 20 UNITS of levemir AT BEDTIME AND novolog as PER sliding scale    [DISCONTINUED] budesonide (PULMICORT) 0.5 MG/2ML nebulizer solution Take 2 mLs (0.5 mg total) by nebulization 2 (two) times daily. (Patient not taking: Reported on 01/17/2020)    [DISCONTINUED] ipratropium-albuterol (DUONEB) 0.5-2.5 (3) MG/3ML SOLN Take 3 mLs by nebulization every 4 (four) hours as needed (shortness of breath). (Patient not taking: Reported on 01/17/2020)    No facility-administered encounter medications on file as of 11/28/2020.    Reviewed chart for medication changes ahead of medication coordination call.  No OVs, Consults, or hospital visits since last care coordination call/Pharmacist visit. (If appropriate, list visit date, provider name)  No medication changes indicated OR if recent visit, treatment plan here.  BP Readings from Last 3 Encounters:  03/13/20 (!) 154/94  02/21/20 136/74  09/08/19 (!) 162/88  Lab Results  Component Value Date   HGBA1C 7.1 (A) 09/08/2019     Patient obtains medications through Vials  90 Days   Last adherence delivery included:  Amlodipine $RemoveBef'10mg'[id number]$  daily  Atorvastatin $RemoveBefor'40mg'CFxBPTkfLmyW$  daily  Fluticasone Propionate 54mcg  Montelukast $RemoveBefo'10mg'BfNqVpAcyub$   Trelegy Aer 171mcg  Trueplus Pen needle 31 gauge x 3/16   Patient is due for next adherence delivery on:11/29/20 Called patient and reviewed medications and coordinated delivery.  This delivery to include: Albuterol sulfate 2.5. mg/3 ml take 3 mls via nebulizer 3 times daily Levemir inj  Flextouch inject 20 units into skin daily at 10pm    Confirmed delivery date of 11/29/20, advised patient that pharmacy will contact them the morning of delivery.  Star Rating Drugs: No ACE/ARB   Ethelene Hal Clinical Pharmacist Assistant [phone]

## 2020-12-08 DIAGNOSIS — M1990 Unspecified osteoarthritis, unspecified site: Secondary | ICD-10-CM | POA: Diagnosis not present

## 2020-12-08 DIAGNOSIS — J441 Chronic obstructive pulmonary disease with (acute) exacerbation: Secondary | ICD-10-CM | POA: Diagnosis not present

## 2020-12-08 DIAGNOSIS — Z96651 Presence of right artificial knee joint: Secondary | ICD-10-CM | POA: Diagnosis not present

## 2020-12-17 ENCOUNTER — Other Ambulatory Visit: Payer: Self-pay | Admitting: Internal Medicine

## 2020-12-17 DIAGNOSIS — J9601 Acute respiratory failure with hypoxia: Secondary | ICD-10-CM

## 2021-01-07 DIAGNOSIS — J441 Chronic obstructive pulmonary disease with (acute) exacerbation: Secondary | ICD-10-CM | POA: Diagnosis not present

## 2021-01-07 DIAGNOSIS — Z96651 Presence of right artificial knee joint: Secondary | ICD-10-CM | POA: Diagnosis not present

## 2021-01-07 DIAGNOSIS — M1990 Unspecified osteoarthritis, unspecified site: Secondary | ICD-10-CM | POA: Diagnosis not present

## 2021-02-06 ENCOUNTER — Other Ambulatory Visit: Payer: Self-pay | Admitting: Internal Medicine

## 2021-02-06 DIAGNOSIS — Z1231 Encounter for screening mammogram for malignant neoplasm of breast: Secondary | ICD-10-CM

## 2021-02-07 DIAGNOSIS — M1990 Unspecified osteoarthritis, unspecified site: Secondary | ICD-10-CM | POA: Diagnosis not present

## 2021-02-07 DIAGNOSIS — J441 Chronic obstructive pulmonary disease with (acute) exacerbation: Secondary | ICD-10-CM | POA: Diagnosis not present

## 2021-02-07 DIAGNOSIS — Z96651 Presence of right artificial knee joint: Secondary | ICD-10-CM | POA: Diagnosis not present

## 2021-02-12 ENCOUNTER — Telehealth: Payer: Self-pay | Admitting: Internal Medicine

## 2021-02-12 NOTE — Telephone Encounter (Signed)
Kindra a Chief of Staff wanted to inform the provider that during he AWV today, the patients A1C was 9 which is higher than normal.  Also the patient has a mammogram scheduled in August and they want to know if she can have her bone density done during that time as well (Dexa order)  Also did a circulation screening today which showed mild impaired circulation in the lower legs.   Please advise- [phone]

## 2021-02-13 NOTE — Telephone Encounter (Signed)
I would recommend a sooner apt to talk about diabetes control. She did not come for labs after last years visit July 2021.

## 2021-02-13 NOTE — Telephone Encounter (Signed)
Needs visit to discuss sugar control other issues can be addressed then.

## 2021-02-13 NOTE — Telephone Encounter (Signed)
Appt made for 02/18/21 at 0940. Pt aware.

## 2021-02-13 NOTE — Telephone Encounter (Signed)
Notified Yvonne Todd that her message was received & provider wanted OV; which is scheduled July 13. She also wanted to let PCP know that Quantiflow result (blood flow in lower legs) - mild 0.8, 0.9  - possible peripheral vascular dz.

## 2021-02-18 ENCOUNTER — Ambulatory Visit (INDEPENDENT_AMBULATORY_CARE_PROVIDER_SITE_OTHER): Payer: Medicare Other | Admitting: Internal Medicine

## 2021-02-18 ENCOUNTER — Ambulatory Visit (INDEPENDENT_AMBULATORY_CARE_PROVIDER_SITE_OTHER): Payer: Medicare Other

## 2021-02-18 ENCOUNTER — Other Ambulatory Visit: Payer: Self-pay

## 2021-02-18 ENCOUNTER — Encounter: Payer: Self-pay | Admitting: Internal Medicine

## 2021-02-18 VITALS — BP 150/72 | HR 79 | Temp 98.1°F | Ht 62.5 in | Wt 174.0 lb

## 2021-02-18 DIAGNOSIS — I10 Essential (primary) hypertension: Secondary | ICD-10-CM

## 2021-02-18 DIAGNOSIS — T380X5D Adverse effect of glucocorticoids and synthetic analogues, subsequent encounter: Secondary | ICD-10-CM | POA: Diagnosis not present

## 2021-02-18 DIAGNOSIS — I739 Peripheral vascular disease, unspecified: Secondary | ICD-10-CM

## 2021-02-18 DIAGNOSIS — I5032 Chronic diastolic (congestive) heart failure: Secondary | ICD-10-CM | POA: Diagnosis not present

## 2021-02-18 DIAGNOSIS — E099 Drug or chemical induced diabetes mellitus without complications: Secondary | ICD-10-CM

## 2021-02-18 DIAGNOSIS — M7989 Other specified soft tissue disorders: Secondary | ICD-10-CM | POA: Diagnosis not present

## 2021-02-18 DIAGNOSIS — J9611 Chronic respiratory failure with hypoxia: Secondary | ICD-10-CM

## 2021-02-18 DIAGNOSIS — J449 Chronic obstructive pulmonary disease, unspecified: Secondary | ICD-10-CM | POA: Diagnosis not present

## 2021-02-18 DIAGNOSIS — R0602 Shortness of breath: Secondary | ICD-10-CM | POA: Diagnosis not present

## 2021-02-18 LAB — COMPREHENSIVE METABOLIC PANEL
ALT: 18 U/L (ref 0–35)
AST: 15 U/L (ref 0–37)
Albumin: 4.5 g/dL (ref 3.5–5.2)
Alkaline Phosphatase: 82 U/L (ref 39–117)
BUN: 15 mg/dL (ref 6–23)
CO2: 39 mEq/L — ABNORMAL HIGH (ref 19–32)
Calcium: 10.1 mg/dL (ref 8.4–10.5)
Chloride: 95 mEq/L — ABNORMAL LOW (ref 96–112)
Creatinine, Ser: 0.48 mg/dL (ref 0.40–1.20)
GFR: 95.58 mL/min (ref >60.00)
Glucose, Bld: 265 mg/dL — ABNORMAL HIGH (ref 70–99)
Potassium: 4.1 mEq/L (ref 3.5–5.1)
Sodium: 141 mEq/L (ref 135–145)
Total Bilirubin: 0.4 mg/dL (ref 0.2–1.2)
Total Protein: 7.2 g/dL (ref 6.0–8.3)

## 2021-02-18 LAB — CBC
HCT: 39.8 % (ref 36.0–46.0)
Hemoglobin: 13.4 g/dL (ref 12.0–15.0)
MCHC: 33.7 g/dL (ref 30.0–36.0)
MCV: 90.9 fl (ref 78.0–100.0)
Platelets: 273 10*3/uL (ref 150.0–400.0)
RBC: 4.38 Mil/uL (ref 3.87–5.11)
RDW: 12.8 % (ref 11.5–15.5)
WBC: 7.9 10*3/uL (ref 4.0–10.5)

## 2021-02-18 LAB — LIPID PANEL
Cholesterol: 196 mg/dL (ref 0–200)
HDL: 52.3 mg/dL (ref >39.00)
LDL Cholesterol: 111 mg/dL — ABNORMAL HIGH (ref 0–99)
NonHDL: 144.03
Total CHOL/HDL Ratio: 4
Triglycerides: 167 mg/dL — ABNORMAL HIGH (ref 0.0–149.0)
VLDL: 33.4 mg/dL (ref 0.0–40.0)

## 2021-02-18 LAB — VITAMIN B12: Vitamin B-12: 155 pg/mL — ABNORMAL LOW (ref 211–911)

## 2021-02-18 LAB — VITAMIN D 25 HYDROXY (VIT D DEFICIENCY, FRACTURES): VITD: 21.15 ng/mL — ABNORMAL LOW (ref 30.00–100.00)

## 2021-02-18 LAB — BRAIN NATRIURETIC PEPTIDE: Pro B Natriuretic peptide (BNP): 33 pg/mL (ref 0.0–100.0)

## 2021-02-18 LAB — MICROALBUMIN / CREATININE URINE RATIO
Creatinine,U: 148.7 mg/dL
Microalb Creat Ratio: 2.7 mg/g (ref 0.0–30.0)
Microalb, Ur: 4 mg/dL — ABNORMAL HIGH (ref 0.0–1.9)

## 2021-02-18 LAB — HEMOGLOBIN A1C: Hgb A1c MFr Bld: 9.8 % — ABNORMAL HIGH (ref 4.6–6.5)

## 2021-02-18 LAB — TSH: TSH: 1.11 u[IU]/mL (ref 0.35–4.50)

## 2021-02-18 NOTE — Progress Notes (Signed)
   Subjective:   Patient ID: Yvonne Todd, female    DOB: 23-May-1951, 70 y.o.   MRN: [id number]  HPI The patient is a 70 YO female coming in for concerns about SOB (using 4L oxygen at home as usual, getting more SOB on exertion last 4-5 months, no illness or change in health at that time, taking more breaks, denies cough or fevers or chills, denies getting covid-19 this year to her knowledge), and swelling feet (worse in the last while, used to swell some due to past injury but now much worse, accumulate through the day and better in the morning), and new PVD (likely, home testing done by home visit from her insurance with mild arterial insufficiency).   Review of Systems  Constitutional:  Positive for activity change and fatigue.  HENT: Negative.    Eyes: Negative.   Respiratory:  Positive for shortness of breath. Negative for cough and chest tightness.   Cardiovascular:  Positive for leg swelling. Negative for chest pain and palpitations.  Gastrointestinal:  Negative for abdominal distention, abdominal pain, constipation, diarrhea, nausea and vomiting.  Musculoskeletal: Negative.   Skin: Negative.   Neurological: Negative.   Psychiatric/Behavioral: Negative.     Objective:  Physical Exam Constitutional:      Appearance: She is well-developed.  HENT:     Head: Normocephalic and atraumatic.  Cardiovascular:     Rate and Rhythm: Normal rate and regular rhythm.  Pulmonary:     Effort: Pulmonary effort is normal. No respiratory distress.     Breath sounds: Normal breath sounds. No wheezing or rales.     Comments: Placed on 4L oxygen at office, O2 sats still running 80s while talking.  Abdominal:     General: Bowel sounds are normal. There is no distension.     Palpations: Abdomen is soft.     Tenderness: There is no abdominal tenderness. There is no rebound.  Musculoskeletal:     Cervical back: Normal range of motion.  Skin:    General: Skin is warm and dry.  Neurological:      Mental Status: She is alert and oriented to person, place, and time.     Coordination: Coordination normal.    Vitals:   02/18/21 0922  BP: (!) 150/72  Pulse: 79  Temp: 98.1 F (36.7 C)  TempSrc: Oral  SpO2: (!) 83%  Weight: 174 lb (78.9 kg)  Height: 5' 2.5" (1.588 m)    This visit occurred during the SARS-CoV-2 public health emergency.  Safety protocols were in place, including screening questions prior to the visit, additional usage of staff PPE, and extensive cleaning of exam room while observing appropriate contact time as indicated for disinfecting solutions.   Assessment & Plan:

## 2021-02-18 NOTE — Assessment & Plan Note (Signed)
With chronic hypoxia on 4L O2 but today persistent low oxygen levels even on oxygen (she did not bring with her clinic provided). Checking CXR stat and BNP and CBC and CMP to assess.

## 2021-02-18 NOTE — Patient Instructions (Signed)
We will check the labs and the x-ray today and may need to make some changes.

## 2021-02-18 NOTE — Assessment & Plan Note (Signed)
Concern for CHF flare and checking BNP. Checking CMP and CBC also for additional causes.

## 2021-02-18 NOTE — Assessment & Plan Note (Signed)
She is taking aspirin and lipitor currently. Home readings done with mild arterial disease in the legs. She is a former smoker. Due to other acute issues we did not investigate more today.

## 2021-02-18 NOTE — Assessment & Plan Note (Signed)
Needs CXR and BNP today as her oxygen levels are persistently low even on her home 4L oxygen which is not typical for her. With new ankle swelling concern for CHF. Checking CBC and CMP as anemia could also be causing.

## 2021-02-18 NOTE — Assessment & Plan Note (Signed)
Checking BNP and CXR today given new hypoxia and swelling.

## 2021-02-25 ENCOUNTER — Telehealth: Payer: Self-pay | Admitting: Internal Medicine

## 2021-02-25 NOTE — Telephone Encounter (Signed)
    Please call patient to discuss lab results 

## 2021-02-25 NOTE — Telephone Encounter (Signed)
Unable to get in contact with the patient due to the phone constantly ringing.

## 2021-02-26 NOTE — Telephone Encounter (Signed)
   Patient returned call. She can be reached at [phone]

## 2021-03-07 ENCOUNTER — Ambulatory Visit (INDEPENDENT_AMBULATORY_CARE_PROVIDER_SITE_OTHER): Payer: Medicare Other | Admitting: Internal Medicine

## 2021-03-07 ENCOUNTER — Encounter: Payer: Self-pay | Admitting: Internal Medicine

## 2021-03-07 ENCOUNTER — Other Ambulatory Visit: Payer: Self-pay

## 2021-03-07 VITALS — BP 130/70 | HR 86 | Temp 98.2°F | Ht 62.5 in | Wt 173.6 lb

## 2021-03-07 DIAGNOSIS — E538 Deficiency of other specified B group vitamins: Secondary | ICD-10-CM | POA: Diagnosis not present

## 2021-03-07 DIAGNOSIS — J9601 Acute respiratory failure with hypoxia: Secondary | ICD-10-CM

## 2021-03-07 DIAGNOSIS — I10 Essential (primary) hypertension: Secondary | ICD-10-CM

## 2021-03-07 DIAGNOSIS — J9611 Chronic respiratory failure with hypoxia: Secondary | ICD-10-CM

## 2021-03-07 DIAGNOSIS — J449 Chronic obstructive pulmonary disease, unspecified: Secondary | ICD-10-CM

## 2021-03-07 MED ORDER — TRUEPLUS PEN NEEDLES 31G X 5 MM MISC: 3 refills | Status: DC

## 2021-03-07 MED ORDER — MONTELUKAST SODIUM 10 MG PO TABS
10.0000 mg | ORAL_TABLET | Freq: Every day | ORAL | 3 refills | Status: DC
Start: 2021-03-07 — End: 2022-02-10

## 2021-03-07 MED ORDER — AMLODIPINE BESYLATE 10 MG PO TABS: 10.0000 mg | ORAL_TABLET | Freq: Every day | ORAL | 3 refills | Status: DC

## 2021-03-07 MED ORDER — INSULIN DETEMIR 100 UNIT/ML FLEXPEN: 20.0000 [IU] | PEN_INJECTOR | Freq: Every day | SUBCUTANEOUS | 11 refills | Status: DC

## 2021-03-07 MED ORDER — BLOOD GLUCOSE METER KIT: PACK | 0 refills | Status: DC

## 2021-03-07 MED ORDER — CYANOCOBALAMIN 1000 MCG/ML IJ SOLN
1000.0000 ug | Freq: Once | INTRAMUSCULAR | Status: AC
  Administered 2021-03-07: 1000 ug via INTRAMUSCULAR

## 2021-03-07 MED ORDER — "SYRINGE 21G X 1"" 3 ML MISC": 0 refills | Status: DC

## 2021-03-07 MED ORDER — CYANOCOBALAMIN 1000 MCG/ML IJ SOLN: 1000.0000 ug | INTRAMUSCULAR | 0 refills | Status: AC

## 2021-03-07 MED ORDER — EMPAGLIFLOZIN 10 MG PO TABS: 10.0000 mg | ORAL_TABLET | Freq: Every day | ORAL | 1 refills | Status: DC

## 2021-03-07 MED ORDER — ATORVASTATIN CALCIUM 40 MG PO TABS: 40.0000 mg | ORAL_TABLET | Freq: Every day | ORAL | 3 refills | Status: DC

## 2021-03-07 NOTE — Progress Notes (Signed)
   Subjective:   Patient ID: Yvonne Todd, female    DOB: Oct 21, 1950, 70 y.o.   MRN: [id number]  HPI The patient is a 70 YO female coming in for follow up diabetes (recent HgA1c much higher and not at goal 9.8, not taking medication currently for sugars, is supposed to be taking insulin and not checking sugars lately), and chronic respiratory failure (needs portable unit for 5L, she is unable to lit and use the portable she currently has), and B12 deficiency (unable to come often for shots, would like to do first today and then do them at home).   Review of Systems  Constitutional:  Positive for fatigue.  HENT: Negative.    Eyes: Negative.   Respiratory:  Positive for shortness of breath. Negative for cough and chest tightness.   Cardiovascular:  Negative for chest pain, palpitations and leg swelling.  Gastrointestinal:  Negative for abdominal distention, abdominal pain, constipation, diarrhea, nausea and vomiting.  Musculoskeletal: Negative.   Skin: Negative.   Neurological: Negative.   Psychiatric/Behavioral: Negative.     Objective:  Physical Exam Constitutional:      Appearance: She is well-developed.  HENT:     Head: Normocephalic and atraumatic.  Cardiovascular:     Rate and Rhythm: Normal rate and regular rhythm.  Pulmonary:     Effort: Pulmonary effort is normal. No respiratory distress.     Breath sounds: Rhonchi present. No wheezing or rales.     Comments: Not wearing oxygen today Abdominal:     General: Bowel sounds are normal. There is no distension.     Palpations: Abdomen is soft.     Tenderness: There is no abdominal tenderness. There is no rebound.  Musculoskeletal:     Cervical back: Normal range of motion.  Skin:    General: Skin is warm and dry.  Neurological:     Mental Status: She is alert and oriented to person, place, and time.     Coordination: Coordination normal.    Vitals:   03/07/21 0809  BP: 130/70  Pulse: 86  Temp: 98.2 F (36.8 C)   TempSrc: Oral  SpO2: (!) 85%  Weight: 173 lb 9.6 oz (78.7 kg)  Height: 5' 2.5" (1.588 m)    This visit occurred during the SARS-CoV-2 public health emergency.  Safety protocols were in place, including screening questions prior to the visit, additional usage of staff PPE, and extensive cleaning of exam room while observing appropriate contact time as indicated for disinfecting solutions.   Assessment & Plan:  B12 given at visit

## 2021-03-07 NOTE — Patient Instructions (Addendum)
We will order the portable oxygen for you.  We will add jardiance once a day for the sugars to help which is a 1 pill daily.  We have given you the B12 shot today and would like you to do a shot every 2 weeks for 3 shots then take an oral B12 pill 1000 mcg daily.   We have sent  refills and a new sugar meter.   Come back in 3 months

## 2021-03-08 ENCOUNTER — Encounter: Payer: Self-pay | Admitting: Internal Medicine

## 2021-03-08 DIAGNOSIS — E538 Deficiency of other specified B group vitamins: Secondary | ICD-10-CM | POA: Insufficient documentation

## 2021-03-08 NOTE — Assessment & Plan Note (Signed)
Taking amlodipine 10 mg daily. Checking CMP and adjust as needed.

## 2021-03-08 NOTE — Assessment & Plan Note (Signed)
Given B12 shot at visit and rx for syringes and B12 vials to complete every 2 weeks for 2 months at home then resume B12 1000 mcg daily.

## 2021-03-08 NOTE — Assessment & Plan Note (Signed)
Ordered oxygen for new porable unit as she cannot lift the one she has and needs to switch.

## 2021-03-08 NOTE — Assessment & Plan Note (Signed)
Adding jardiance 10 mg daily. Recent HgA1c above goal. Resume her insulin and rx for new glucose meter so she can monitor.

## 2021-03-09 DIAGNOSIS — Z96651 Presence of right artificial knee joint: Secondary | ICD-10-CM | POA: Diagnosis not present

## 2021-03-09 DIAGNOSIS — J441 Chronic obstructive pulmonary disease with (acute) exacerbation: Secondary | ICD-10-CM | POA: Diagnosis not present

## 2021-03-09 DIAGNOSIS — M1990 Unspecified osteoarthritis, unspecified site: Secondary | ICD-10-CM | POA: Diagnosis not present

## 2021-03-13 ENCOUNTER — Encounter: Payer: Medicare Other | Admitting: Internal Medicine

## 2021-03-17 ENCOUNTER — Other Ambulatory Visit: Payer: Self-pay | Admitting: Internal Medicine

## 2021-03-17 DIAGNOSIS — J449 Chronic obstructive pulmonary disease, unspecified: Secondary | ICD-10-CM

## 2021-03-17 DIAGNOSIS — I10 Essential (primary) hypertension: Secondary | ICD-10-CM

## 2021-03-17 DIAGNOSIS — J9601 Acute respiratory failure with hypoxia: Secondary | ICD-10-CM

## 2021-04-03 ENCOUNTER — Inpatient Hospital Stay: Admission: RE | Admit: 2021-04-03 | Payer: Medicare Other | Source: Ambulatory Visit

## 2021-04-09 DIAGNOSIS — M1990 Unspecified osteoarthritis, unspecified site: Secondary | ICD-10-CM | POA: Diagnosis not present

## 2021-04-09 DIAGNOSIS — Z96651 Presence of right artificial knee joint: Secondary | ICD-10-CM | POA: Diagnosis not present

## 2021-04-09 DIAGNOSIS — J441 Chronic obstructive pulmonary disease with (acute) exacerbation: Secondary | ICD-10-CM | POA: Diagnosis not present

## 2021-05-10 DIAGNOSIS — Z96651 Presence of right artificial knee joint: Secondary | ICD-10-CM | POA: Diagnosis not present

## 2021-05-10 DIAGNOSIS — M1990 Unspecified osteoarthritis, unspecified site: Secondary | ICD-10-CM | POA: Diagnosis not present

## 2021-05-10 DIAGNOSIS — J441 Chronic obstructive pulmonary disease with (acute) exacerbation: Secondary | ICD-10-CM | POA: Diagnosis not present

## 2021-05-23 ENCOUNTER — Telehealth: Payer: Self-pay | Admitting: Pharmacist

## 2021-05-23 NOTE — Progress Notes (Signed)
Chronic Care Management Pharmacy Assistant   Name: Yvonne Todd  MRN: [id number] DOB: [birth year]   Reason for Encounter: Disease State   Conditions to be addressed/monitored: DMII   Recent office visits:  03/07/21 Myrlene Broker, MD (PCP) Chronic respiratory failure with hypoxia  02/18/21 Myrlene Broker, MD (PCP) Annual Exam, Foot Swelling, ordered labs, no med changes Recent consult visits:  None noted  Hospital visits:  None in previous 6 months  Medications: Outpatient Encounter Medications as of 05/23/2021  Medication Sig Note   albuterol (PROVENTIL) (2.5 MG/3ML) 0.083% nebulizer solution Take 3 mLs (2.5 mg total) by nebulization 3 (three) times daily.    albuterol (VENTOLIN HFA) 108 (90 Base) MCG/ACT inhaler INHALE TWO PUFFS INTO THE LUNGS EVERY 4 HOURS AS NEEDED FOR SHORTNESS OF BREATH OR WHEEZING    amLODipine (NORVASC) 10 MG tablet Take 1 tablet (10 mg total) by mouth daily.    aspirin EC 81 MG tablet Take 1 tablet (81 mg total) by mouth daily.    atorvastatin (LIPITOR) 40 MG tablet Take 1 tablet (40 mg total) by mouth at bedtime.    blood glucose meter kit and supplies Dispense based on patient and insurance preference. Use up to four times daily as directed. (FOR ICD-10 E10.9, E11.9).    calcium carbonate (OS-CAL) 600 MG TABS tablet Take 600 mg by mouth 2 (two) times daily with a meal.    diphenhydrAMINE (BENADRYL) 25 MG tablet Take 50 mg by mouth at bedtime.    diphenhydramine-acetaminophen (TYLENOL PM) 25-500 MG TABS tablet Take 2 tablets by mouth at bedtime as needed (For pain.).    empagliflozin (JARDIANCE) 10 MG TABS tablet Take 1 tablet (10 mg total) by mouth daily before breakfast.    fluticasone (FLONASE) 50 MCG/ACT nasal spray PLACE TWO SPRAYS INTO BOTH NOSTRILS ONCE DAILY    insulin detemir (LEVEMIR) 100 UNIT/ML FlexPen Inject 20 Units into the skin daily at 10 pm.    Insulin Pen Needle (TRUEPLUS PEN NEEDLES) 31G X 5 MM MISC USE TO INJECT 20  UNITS of levemir AT BEDTIME    Melatonin 5 MG TABS Take 5 mg by mouth at bedtime as needed (sleep).     montelukast (SINGULAIR) 10 MG tablet Take 1 tablet (10 mg total) by mouth at bedtime.    Multiple Vitamin (MULTIVITAMIN) tablet Take 1 tablet by mouth daily.    nitroGLYCERIN (NITROSTAT) 0.4 MG SL tablet Place 1 tablet (0.4 mg total) under the tongue every 5 (five) minutes as needed for chest pain. 01/30/2017: Patient stated that she has never used this and that her bottle expired so she threw it away.   Syringe/Needle, Disp, (SYRINGE 3CC/21GX1") 21G X 1" 3 ML MISC Use for B12 injection    tiZANidine (ZANAFLEX) 2 MG tablet Take 1 tablet (2 mg total) by mouth 2 (two) times daily as needed for muscle spasms.    TRELEGY ELLIPTA 100-62.5-25 MCG/INH AEPB INHALE ONE PUFF INTO THE LUNGS ONCE DAILY    [DISCONTINUED] budesonide (PULMICORT) 0.5 MG/2ML nebulizer solution Take 2 mLs (0.5 mg total) by nebulization 2 (two) times daily. (Patient not taking: Reported on 01/17/2020)    [DISCONTINUED] ipratropium-albuterol (DUONEB) 0.5-2.5 (3) MG/3ML SOLN Take 3 mLs by nebulization every 4 (four) hours as needed (shortness of breath). (Patient not taking: Reported on 01/17/2020)    No facility-administered encounter medications on file as of 05/23/2021.     Recent Relevant Labs: Lab Results  Component Value Date/Time   HGBA1C 9.8 (H) 02/18/2021  10:00 AM   HGBA1C 7.1 (A) 09/08/2019 01:26 PM   HGBA1C 7.9 (H) 03/09/2019 08:42 AM   MICROALBUR 4.0 (H) 02/18/2021 10:00 AM    Kidney Function Lab Results  Component Value Date/Time   CREATININE 0.48 02/18/2021 10:00 AM   CREATININE 0.52 03/09/2019 08:42 AM   GFR 95.58 02/18/2021 10:00 AM   GFRNONAA >60 02/05/2018 05:56 AM   GFRAA >60 02/05/2018 05:56 AM     Contacted patient on 05/23/21 to discuss diabetes disease state.   Current antihyperglycemic regimen:  Levemir inject 20 units daily Jardiance 10 mg  10 mg daily   Patient verbally confirms she is taking  the above medications as directed. Yes  What diet changes have been made to improve diabetes control?  What recent interventions/DTPs have been made to improve glycemic control:  None noted  Have there been any recent hospitalizations or ED visits since last visit with CPP? No  Patient denies hypoglycemic symptoms, including None  Patient denies hyperglycemic symptoms, including none  How often are you checking your blood sugar? in the morning before eating or drinking  What are your blood sugars ranging? 120-145 Fasting: checks when she wakes up  During the week, how often does your blood glucose drop below 70? Never  Are you checking your feet daily/regularly? Yes  Adherence Review: Is the patient currently on a STATIN medication? Yes Is the patient currently on ACE/ARB medication? No Does the patient have >5 day gap between last estimated fill dates? No  Care Gaps: Annual wellness visit in last year? Yes Most Recent BP reading:130/70-03/07/21  If Diabetic: Most recent A1C reading: Last eye exam / retinopathy screening:05/16/2016 Last diabetic foot exam:03/13/20  Star Rating Drugs:  Medication:  Last Fill: Day Supply Atorvastatin 40 mg 03/12/21 90 Jardiance 10 mg  03/12/21 90  PCP appointment on 06/12/21    [facility] Pharmacist Assistant [phone]   Time spent:30

## 2021-05-28 ENCOUNTER — Telehealth: Payer: Self-pay | Admitting: Pharmacist

## 2021-05-28 NOTE — Progress Notes (Signed)
Chronic Care Management Pharmacy Assistant   Name: Yvonne Todd  MRN: [id number] DOB: [birth year]  Reason for Encounter: Medication Review  Office visits: None noted  Consult Visits: None noted  Hospital visits:  None in previous 6 months  Medications: Outpatient Encounter Medications as of 05/28/2021  Medication Sig Note   albuterol (PROVENTIL) (2.5 MG/3ML) 0.083% nebulizer solution Take 3 mLs (2.5 mg total) by nebulization 3 (three) times daily.    albuterol (VENTOLIN HFA) 108 (90 Base) MCG/ACT inhaler INHALE TWO PUFFS INTO THE LUNGS EVERY 4 HOURS AS NEEDED FOR SHORTNESS OF BREATH OR WHEEZING    amLODipine (NORVASC) 10 MG tablet Take 1 tablet (10 mg total) by mouth daily.    aspirin EC 81 MG tablet Take 1 tablet (81 mg total) by mouth daily.    atorvastatin (LIPITOR) 40 MG tablet Take 1 tablet (40 mg total) by mouth at bedtime.    blood glucose meter kit and supplies Dispense based on patient and insurance preference. Use up to four times daily as directed. (FOR ICD-10 E10.9, E11.9).    calcium carbonate (OS-CAL) 600 MG TABS tablet Take 600 mg by mouth 2 (two) times daily with a meal.    diphenhydrAMINE (BENADRYL) 25 MG tablet Take 50 mg by mouth at bedtime.    diphenhydramine-acetaminophen (TYLENOL PM) 25-500 MG TABS tablet Take 2 tablets by mouth at bedtime as needed (For pain.).    empagliflozin (JARDIANCE) 10 MG TABS tablet Take 1 tablet (10 mg total) by mouth daily before breakfast.    fluticasone (FLONASE) 50 MCG/ACT nasal spray PLACE TWO SPRAYS INTO BOTH NOSTRILS ONCE DAILY    insulin detemir (LEVEMIR) 100 UNIT/ML FlexPen Inject 20 Units into the skin daily at 10 pm.    Insulin Pen Needle (TRUEPLUS PEN NEEDLES) 31G X 5 MM MISC USE TO INJECT 20 UNITS of levemir AT BEDTIME    Melatonin 5 MG TABS Take 5 mg by mouth at bedtime as needed (sleep).     montelukast (SINGULAIR) 10 MG tablet Take 1 tablet (10 mg total) by mouth at bedtime.    Multiple Vitamin (MULTIVITAMIN) tablet  Take 1 tablet by mouth daily.    nitroGLYCERIN (NITROSTAT) 0.4 MG SL tablet Place 1 tablet (0.4 mg total) under the tongue every 5 (five) minutes as needed for chest pain. 01/30/2017: Patient stated that she has never used this and that her bottle expired so she threw it away.   Syringe/Needle, Disp, (SYRINGE 3CC/21GX1") 21G X 1" 3 ML MISC Use for B12 injection    tiZANidine (ZANAFLEX) 2 MG tablet Take 1 tablet (2 mg total) by mouth 2 (two) times daily as needed for muscle spasms.    TRELEGY ELLIPTA 100-62.5-25 MCG/INH AEPB INHALE ONE PUFF INTO THE LUNGS ONCE DAILY    [DISCONTINUED] budesonide (PULMICORT) 0.5 MG/2ML nebulizer solution Take 2 mLs (0.5 mg total) by nebulization 2 (two) times daily. (Patient not taking: Reported on 01/17/2020)    [DISCONTINUED] ipratropium-albuterol (DUONEB) 0.5-2.5 (3) MG/3ML SOLN Take 3 mLs by nebulization every 4 (four) hours as needed (shortness of breath). (Patient not taking: Reported on 01/17/2020)    No facility-administered encounter medications on file as of 05/28/2021.   BP Readings from Last 3 Encounters:  03/07/21 130/70  02/18/21 (!) 150/72  03/13/20 (!) 154/94    Lab Results  Component Value Date   HGBA1C 9.8 (H) 02/18/2021    Patient is due for next adherence delivery on: 06/07/21  Spoke with patient on 05/28/21 reviewed medications and coordinated  delivery.  This delivery to include: Vials  90 Days  Amlodipine 32m daily  Atorvastatin 466mdaily  Jardiance 10 mg Trueplus Pen needle 31 gauge x 3/16 Levemir 100 unit Ventolin HFA 108 inhaler  Patient declined the following medications this month: Montelukast - patient states she buys OTC now.  Any concerns about your medications? No  How often do you forget or accidentally miss a dose? Rarely  Is patient in packaging: No  No refill request needed.  Confirmed delivery date of 06/07/21, advised patient that pharmacy will contact them the morning of delivery.  Annual wellness visit in  last year? Yes Most Recent BP reading: 130/70   P-86  If Diabetic: Most recent A1C reading: 9.8 Last eye exam / retinopathy screening: 05/16/16 Last diabetic foot exam:03/13/20  Star Rating Drugs: Atorvastatin 40 mg      03/12/21            90 Jardiance 10 mg         03/12/21            90[facility] [phone] Spent: [id number]

## 2021-06-09 DIAGNOSIS — J441 Chronic obstructive pulmonary disease with (acute) exacerbation: Secondary | ICD-10-CM | POA: Diagnosis not present

## 2021-06-09 DIAGNOSIS — Z96651 Presence of right artificial knee joint: Secondary | ICD-10-CM | POA: Diagnosis not present

## 2021-06-09 DIAGNOSIS — M1990 Unspecified osteoarthritis, unspecified site: Secondary | ICD-10-CM | POA: Diagnosis not present

## 2021-06-10 ENCOUNTER — Telehealth: Payer: Self-pay | Admitting: Internal Medicine

## 2021-06-10 NOTE — Telephone Encounter (Signed)
Left message for patient to call me back at [phone] to schedule Medicare Annual Wellness Visit   Last AWV  03/13/20  Please schedule at anytime with [facility] Advisor if patient calls the office back.    40 Minutes appointment   Any questions, please call me at [phone]

## 2021-06-12 ENCOUNTER — Ambulatory Visit: Payer: Medicare Other | Admitting: Internal Medicine

## 2021-06-17 ENCOUNTER — Other Ambulatory Visit: Payer: Self-pay

## 2021-06-17 ENCOUNTER — Ambulatory Visit (INDEPENDENT_AMBULATORY_CARE_PROVIDER_SITE_OTHER): Payer: Medicare Other

## 2021-06-17 VITALS — BP 140/70 | HR 63 | Temp 98.3°F | Ht 63.0 in | Wt 177.4 lb

## 2021-06-17 DIAGNOSIS — E118 Type 2 diabetes mellitus with unspecified complications: Secondary | ICD-10-CM | POA: Diagnosis not present

## 2021-06-17 DIAGNOSIS — Z0000 Encounter for general adult medical examination without abnormal findings: Secondary | ICD-10-CM

## 2021-06-17 NOTE — Patient Instructions (Signed)
Yvonne Todd , Thank you for taking time to come for your Medicare Wellness Visit. I appreciate your ongoing commitment to your health goals. Please review the following plan we discussed and let me know if I can assist you in the future.   Screening recommendations/referrals: Colonoscopy: declined Mammogram: rescheduled due to Covid vaccine Bone Density: never done Recommended yearly ophthalmology/optometry visit for glaucoma screening and checkup Recommended yearly dental visit for hygiene and checkup  Vaccinations: Influenza vaccine: 06/14/2021 Pneumococcal vaccine: 07/01/2016, 09/08/2019 Tdap vaccine: 12/01/2017; due every 10 years Shingles vaccine: need documentation   Covid-19: 10/15/2019, 11/07/2019, 06/14/2021  Advanced directives: Please bring a copy of your health care power of attorney and living will to the office at your convenience.  Conditions/risks identified: Yes; Client understands the importance of follow-up with providers by attending scheduled visits and discussed goals to eat healthier, increase physical activity, exercise the brain, socialize more, get enough sleep and make time for laughter.  Next appointment: Please schedule your next Medicare Wellness Visit with your Nurse Health Advisor in 1 year by calling [phone].   Preventive Care 71 Years and Older, Female Preventive care refers to lifestyle choices and visits with your health care provider that can promote health and wellness. What does preventive care include? A yearly physical exam. This is also called an annual well check. Dental exams once or twice a year. Routine eye exams. Ask your health care provider how often you should have your eyes checked. Personal lifestyle choices, including: Daily care of your teeth and gums. Regular physical activity. Eating a healthy diet. Avoiding tobacco and drug use. Limiting alcohol use. Practicing safe sex. Taking low-dose aspirin every day. Taking vitamin and  mineral supplements as recommended by your health care provider. What happens during an annual well check? The services and screenings done by your health care provider during your annual well check will depend on your age, overall health, lifestyle risk factors, and family history of disease. Counseling  Your health care provider may ask you questions about your: Alcohol use. Tobacco use. Drug use. Emotional well-being. Home and relationship well-being. Sexual activity. Eating habits. History of falls. Memory and ability to understand (cognition). Work and work Astronomer. Reproductive health. Screening  You may have the following tests or measurements: Height, weight, and BMI. Blood pressure. Lipid and cholesterol levels. These may be checked every 5 years, or more frequently if you are over 22 years old. Skin check. Lung cancer screening. You may have this screening every year starting at age 71 if you have a 30-pack-year history of smoking and currently smoke or have quit within the past 15 years. Fecal occult blood test (FOBT) of the stool. You may have this test every year starting at age 71. Flexible sigmoidoscopy or colonoscopy. You may have a sigmoidoscopy every 5 years or a colonoscopy every 10 years starting at age 71. Hepatitis C blood test. Hepatitis B blood test. Sexually transmitted disease (STD) testing. Diabetes screening. This is done by checking your blood sugar (glucose) after you have not eaten for a while (fasting). You may have this done every 1-3 years. Bone density scan. This is done to screen for osteoporosis. You may have this done starting at age 71. Mammogram. This may be done every 1-2 years. Talk to your health care provider about how often you should have regular mammograms. Talk with your health care provider about your test results, treatment options, and if necessary, the need for more tests. Vaccines  Your health care provider  may recommend  certain vaccines, such as: Influenza vaccine. This is recommended every year. Tetanus, diphtheria, and acellular pertussis (Tdap, Td) vaccine. You may need a Td booster every 10 years. Zoster vaccine. You may need this after age 71. Pneumococcal 13-valent conjugate (PCV13) vaccine. One dose is recommended after age 71. Pneumococcal polysaccharide (PPSV23) vaccine. One dose is recommended after age 71. Talk to your health care provider about which screenings and vaccines you need and how often you need them. This information is not intended to replace advice given to you by your health care provider. Make sure you discuss any questions you have with your health care provider. Document Released: 09/14/2015 Document Revised: 05/07/2016 Document Reviewed: 06/19/2015 Elsevier Interactive Patient Education  2017 [place] Prevention in the Home Falls can cause injuries. They can happen to people of all ages. There are many things you can do to make your home safe and to help prevent falls. What can I do on the outside of my home? Regularly fix the edges of walkways and driveways and fix any cracks. Remove anything that might make you trip as you walk through a door, such as a raised step or threshold. Trim any bushes or trees on the path to your home. Use bright outdoor lighting. Clear any walking paths of anything that might make someone trip, such as rocks or tools. Regularly check to see if handrails are loose or broken. Make sure that both sides of any steps have handrails. Any raised decks and porches should have guardrails on the edges. Have any leaves, snow, or ice cleared regularly. Use sand or salt on walking paths during winter. Clean up any spills in your garage right away. This includes oil or grease spills. What can I do in the bathroom? Use night lights. Install grab bars by the toilet and in the tub and shower. Do not use towel bars as grab bars. Use non-skid mats or  decals in the tub or shower. If you need to sit down in the shower, use a plastic, non-slip stool. Keep the floor dry. Clean up any water that spills on the floor as soon as it happens. Remove soap buildup in the tub or shower regularly. Attach bath mats securely with double-sided non-slip rug tape. Do not have throw rugs and other things on the floor that can make you trip. What can I do in the bedroom? Use night lights. Make sure that you have a light by your bed that is easy to reach. Do not use any sheets or blankets that are too big for your bed. They should not hang down onto the floor. Have a firm chair that has side arms. You can use this for support while you get dressed. Do not have throw rugs and other things on the floor that can make you trip. What can I do in the kitchen? Clean up any spills right away. Avoid walking on wet floors. Keep items that you use a lot in easy-to-reach places. If you need to reach something above you, use a strong step stool that has a grab bar. Keep electrical cords out of the way. Do not use floor polish or wax that makes floors slippery. If you must use wax, use non-skid floor wax. Do not have throw rugs and other things on the floor that can make you trip. What can I do with my stairs? Do not leave any items on the stairs. Make sure that there are handrails on both sides  of the stairs and use them. Fix handrails that are broken or loose. Make sure that handrails are as long as the stairways. Check any carpeting to make sure that it is firmly attached to the stairs. Fix any carpet that is loose or worn. Avoid having throw rugs at the top or bottom of the stairs. If you do have throw rugs, attach them to the floor with carpet tape. Make sure that you have a light switch at the top of the stairs and the bottom of the stairs. If you do not have them, ask someone to add them for you. What else can I do to help prevent falls? Wear shoes that: Do not  have high heels. Have rubber bottoms. Are comfortable and fit you well. Are closed at the toe. Do not wear sandals. If you use a stepladder: Make sure that it is fully opened. Do not climb a closed stepladder. Make sure that both sides of the stepladder are locked into place. Ask someone to hold it for you, if possible. Clearly mark and make sure that you can see: Any grab bars or handrails. First and last steps. Where the edge of each step is. Use tools that help you move around (mobility aids) if they are needed. These include: Canes. Walkers. Scooters. Crutches. Turn on the lights when you go into a dark area. Replace any light bulbs as soon as they burn out. Set up your furniture so you have a clear path. Avoid moving your furniture around. If any of your floors are uneven, fix them. If there are any pets around you, be aware of where they are. Review your medicines with your doctor. Some medicines can make you feel dizzy. This can increase your chance of falling. Ask your doctor what other things that you can do to help prevent falls. This information is not intended to replace advice given to you by your health care provider. Make sure you discuss any questions you have with your health care provider. Document Released: 06/14/2009 Document Revised: 01/24/2016 Document Reviewed: 09/22/2014 Elsevier Interactive Patient Education  2017 [employer].
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
## 2021-08-21 ENCOUNTER — Other Ambulatory Visit: Payer: Self-pay | Admitting: Internal Medicine

## 2021-08-21 DIAGNOSIS — J9601 Acute respiratory failure with hypoxia: Secondary | ICD-10-CM

## 2021-09-04 ENCOUNTER — Other Ambulatory Visit: Payer: Self-pay | Admitting: Internal Medicine

## 2021-09-20 ENCOUNTER — Ambulatory Visit (INDEPENDENT_AMBULATORY_CARE_PROVIDER_SITE_OTHER): Payer: Medicare Other | Admitting: Internal Medicine

## 2021-09-20 ENCOUNTER — Other Ambulatory Visit: Payer: Self-pay

## 2021-09-20 ENCOUNTER — Encounter: Payer: Self-pay | Admitting: Internal Medicine

## 2021-09-20 VITALS — BP 130/80 | HR 88 | Resp 18 | Ht 63.0 in | Wt 188.8 lb

## 2021-09-20 DIAGNOSIS — E118 Type 2 diabetes mellitus with unspecified complications: Secondary | ICD-10-CM

## 2021-09-20 LAB — POCT GLYCOSYLATED HEMOGLOBIN (HGB A1C): Hemoglobin A1C: 11.4 % — AB (ref 4.0–5.6)

## 2021-09-20 MED ORDER — EMPAGLIFLOZIN 25 MG PO TABS: 25.0000 mg | ORAL_TABLET | Freq: Every day | ORAL | 3 refills | Status: DC

## 2021-09-20 MED ORDER — GLIMEPIRIDE 1 MG PO TABS: 1.0000 mg | ORAL_TABLET | Freq: Every day | ORAL | 0 refills | Status: DC

## 2021-09-20 NOTE — Progress Notes (Signed)
° °  Subjective:   Patient ID: Yvonne Todd, female    DOB: 1950-12-06, 71 y.o.   MRN: [id number]  HPI The patient is a 71 YO female coming in for diabetes follow up. Still checking morning sugars 80-130. No lows and no high. Does not monitor at other times of day. Taking jardiance 10 mg daily and levemir 20 units qhs did not follow up as recommended.   Review of Systems  Constitutional: Negative.   HENT: Negative.    Eyes: Negative.   Respiratory:  Negative for cough, chest tightness and shortness of breath.   Cardiovascular:  Negative for chest pain, palpitations and leg swelling.  Gastrointestinal:  Negative for abdominal distention, abdominal pain, constipation, diarrhea, nausea and vomiting.  Musculoskeletal: Negative.   Skin: Negative.   Neurological: Negative.   Psychiatric/Behavioral: Negative.     Objective:  Physical Exam Constitutional:      Appearance: She is well-developed.  HENT:     Head: Normocephalic and atraumatic.  Cardiovascular:     Rate and Rhythm: Normal rate and regular rhythm.  Pulmonary:     Effort: Pulmonary effort is normal. No respiratory distress.     Breath sounds: Normal breath sounds. No wheezing or rales.  Abdominal:     General: Bowel sounds are normal. There is no distension.     Palpations: Abdomen is soft.     Tenderness: There is no abdominal tenderness. There is no rebound.  Musculoskeletal:     Cervical back: Normal range of motion.  Skin:    General: Skin is warm and dry.     Comments: Foot exam done  Neurological:     Mental Status: She is alert and oriented to person, place, and time.     Coordination: Coordination normal.    Vitals:   09/20/21 0851  BP: 130/80  Pulse: 88  Resp: 18  SpO2: 92%  Weight: 188 lb 12.8 oz (85.6 kg)  Height: 5\' 3"  (1.6 m)    This visit occurred during the SARS-CoV-2 public health emergency.  Safety protocols were in place, including screening questions prior to the visit, additional usage of  staff PPE, and extensive cleaning of exam room while observing appropriate contact time as indicated for disinfecting solutions.   Assessment & Plan:  Visit time 20 minutes in face to face communication with patient and coordination of care, additional 10 minutes spent in record review, coordination or care, ordering tests, communicating/referring to other healthcare professionals, documenting in medical records all on the same day of the visit for total time 30 minutes spent on the visit.

## 2021-09-20 NOTE — Assessment & Plan Note (Signed)
POC HgA1c done today which was 11.4. Severe exacerbation. She did start jardiance 10 mg daily but did not follow up as advised. We will increase jardiance to 25 mg daily and add amaryl 1 mg daily. Continue levemir 20 units daily and no room for titration with normal fasting sugars. Needs visit in 3 months and close follow up. Foot exam done today.

## 2021-09-20 NOTE — Patient Instructions (Addendum)
We will increase the dose of the jardiance to 25 mg daily (1 pill). We will also add glimepiride to take 1 pill daily with first meal of day.  Your HgA1c is 11.4 today which is not good. We need to see you back in 3 months.

## 2021-10-23 ENCOUNTER — Telehealth: Payer: Medicare Other

## 2021-11-21 ENCOUNTER — Other Ambulatory Visit: Payer: Self-pay | Admitting: Internal Medicine

## 2021-11-21 ENCOUNTER — Telehealth: Payer: Self-pay | Admitting: Internal Medicine

## 2021-11-21 DIAGNOSIS — J9601 Acute respiratory failure with hypoxia: Secondary | ICD-10-CM

## 2021-11-25 NOTE — Telephone Encounter (Signed)
Pharmacy inquiring why 90 day supply was not prescribed  ? ?Informed caller per cma, pt will need consistent future ov to obtain a 90 day supply  ? ?Caller states they will relay mssg to pt ?

## 2021-11-25 NOTE — Telephone Encounter (Signed)
Pharmacy calling in ? ?Says they received rx for glimepiride (AMARYL) 1 MG tablet w/ 30DS ? ?Wants to know if provider can send in new rx but instead make it for a 90DS ? ?[facility] - [place], [place] - [address] Phone:  [phone]  ?Fax:  [phone]  ?  ? ? ?

## 2021-11-26 LAB — HEMOGLOBIN A1C: Hemoglobin A1C: 6.5

## 2021-11-29 ENCOUNTER — Encounter: Payer: Self-pay | Admitting: Internal Medicine

## 2021-11-29 ENCOUNTER — Ambulatory Visit (INDEPENDENT_AMBULATORY_CARE_PROVIDER_SITE_OTHER): Payer: Medicare Other | Admitting: Internal Medicine

## 2021-11-29 DIAGNOSIS — J9611 Chronic respiratory failure with hypoxia: Secondary | ICD-10-CM

## 2021-11-29 DIAGNOSIS — J9601 Acute respiratory failure with hypoxia: Secondary | ICD-10-CM

## 2021-11-29 DIAGNOSIS — E118 Type 2 diabetes mellitus with unspecified complications: Secondary | ICD-10-CM | POA: Diagnosis not present

## 2021-11-29 MED ORDER — NEBULIZER/TUBING/MOUTHPIECE KIT: PACK | 3 refills | Status: AC

## 2021-11-29 MED ORDER — ALBUTEROL SULFATE (2.5 MG/3ML) 0.083% IN NEBU: 2.5000 mg | INHALATION_SOLUTION | Freq: Three times a day (TID) | RESPIRATORY_TRACT | 2 refills | Status: DC

## 2021-11-29 MED ORDER — GLIMEPIRIDE 1 MG PO TABS: ORAL_TABLET | ORAL | 3 refills | Status: DC

## 2021-11-29 NOTE — Assessment & Plan Note (Signed)
Needs new supplies for nebulizer so rx done today.  ?

## 2021-11-29 NOTE — Assessment & Plan Note (Signed)
She has started glimepiride 1 mg daily and no low sugars. Continue glimepiride 1 mg daily and jardiance 25 mg daily (dose increased at last visit) and levemir 20 units daily. She had HgA1c checked this week and brought results 6.5 which we discussed today. Keep regimen same.  ?

## 2021-11-29 NOTE — Patient Instructions (Addendum)
Great job with the sugars.We have sent in for the nebulizer supplies. ? ? ?

## 2021-11-29 NOTE — Progress Notes (Signed)
? ?  Subjective:  ? ?Patient ID: Yvonne Todd, female    DOB: April 26, 1951, 71 y.o.   MRN: [id number] ? ?HPI ?The patient is a 71 YO female coming in for follow up.  ? ?Review of Systems  ?Constitutional: Negative.   ?HENT: Negative.    ?Eyes: Negative.   ?Respiratory:  Negative for cough, chest tightness and shortness of breath.   ?Cardiovascular:  Negative for chest pain, palpitations and leg swelling.  ?Gastrointestinal:  Negative for abdominal distention, abdominal pain, constipation, diarrhea, nausea and vomiting.  ?Musculoskeletal: Negative.   ?Skin: Negative.   ?Neurological: Negative.   ?Psychiatric/Behavioral: Negative.    ? ?Objective:  ?Physical Exam ?Constitutional:   ?   Appearance: She is well-developed.  ?HENT:  ?   Head: Normocephalic and atraumatic.  ?Cardiovascular:  ?   Rate and Rhythm: Normal rate and regular rhythm.  ?Pulmonary:  ?   Effort: Pulmonary effort is normal. No respiratory distress.  ?   Breath sounds: Wheezing present. No rales.  ?   Comments: Stable lung exam ?Abdominal:  ?   General: Bowel sounds are normal. There is no distension.  ?   Palpations: Abdomen is soft.  ?   Tenderness: There is no abdominal tenderness. There is no rebound.  ?Musculoskeletal:  ?   Cervical back: Normal range of motion.  ?Skin: ?   General: Skin is warm and dry.  ?Neurological:  ?   Mental Status: She is alert and oriented to person, place, and time.  ?   Coordination: Coordination normal.  ? ? ?Vitals:  ? 11/29/21 1500  ?BP: 130/78  ?Pulse: 78  ?Resp: 18  ?SpO2: 90%  ?Weight: 199 lb 9.6 oz (90.5 kg)  ?Height: 5\' 3"  (1.6 m)  ? ?HgA1c 6.5 with home visit 11/26/21 ? ?This visit occurred during the SARS-CoV-2 public health emergency.  Safety protocols were in place, including screening questions prior to the visit, additional usage of staff PPE, and extensive cleaning of exam room while observing appropriate contact time as indicated for disinfecting solutions.  ? ?Assessment & Plan:  ? ?

## 2021-12-23 ENCOUNTER — Ambulatory Visit (INDEPENDENT_AMBULATORY_CARE_PROVIDER_SITE_OTHER): Payer: Medicare Other | Admitting: Pulmonary Disease

## 2021-12-23 ENCOUNTER — Encounter: Payer: Self-pay | Admitting: Pulmonary Disease

## 2021-12-23 VITALS — BP 138/78 | HR 89 | Temp 98.0°F | Ht 62.0 in | Wt 193.2 lb

## 2021-12-23 DIAGNOSIS — J4540 Moderate persistent asthma, uncomplicated: Secondary | ICD-10-CM

## 2021-12-23 DIAGNOSIS — Z87891 Personal history of nicotine dependence: Secondary | ICD-10-CM

## 2021-12-23 DIAGNOSIS — J449 Chronic obstructive pulmonary disease, unspecified: Secondary | ICD-10-CM

## 2021-12-23 NOTE — Progress Notes (Signed)
? ?      ?Yvonne Todd    [id number]    1950/09/19 ? ?Primary Care Physician:Crawford, Real Cons, MD ? ?Referring Physician: Hoyt Koch, MD ?[address],  [place] [address] ? ?Chief complaint:  ?Follow-up for COPD, asthma ? ?HPI: ?71 year old with history of COPD, asthma.  Evaluated in the pulmonary clinic in 2016 after hospitalization for COPD exacerbation.  He was lost to follow-up and then seen back in clinic in 20019 for recurrent COPD exacerbations, trouble breathing over few months.  Quit smoking around July 2019.  She was started on trelegy inhaler.  Reports improvement in symptoms.  She likes this better than Anoro which she was on prior. ? ?She had a follow-up CT of the chest for lung nodules which showed resolution of previously observed nodules. ? ?Interim history: ?Seen back in clinic after gap of 4 years.  She wants a Marine scientist for travel as she is planning a trip to [place] to visit some friends. ?Continues on Trelegy inhaler and supplemental oxygen ? ?Outpatient Encounter Medications as of 12/23/2021  ?Medication Sig  ? albuterol (PROVENTIL) (2.5 MG/3ML) 0.083% nebulizer solution Take 3 mLs (2.5 mg total) by nebulization 3 (three) times daily.  ? albuterol (VENTOLIN HFA) 108 (90 Base) MCG/ACT inhaler INHALE TWO PUFFS INTO THE LUNGS EVERY 4 HOURS AS NEEDED FOR SHORTNESS OF BREATH OR WHEEZING  ? amLODipine (NORVASC) 10 MG tablet Take 1 tablet (10 mg total) by mouth daily.  ? aspirin EC 81 MG tablet Take 1 tablet (81 mg total) by mouth daily.  ? atorvastatin (LIPITOR) 40 MG tablet Take 1 tablet (40 mg total) by mouth at bedtime.  ? blood glucose meter kit and supplies Dispense based on patient and insurance preference. Use up to four times daily as directed. (FOR ICD-10 E10.9, E11.9).  ? calcium carbonate (OS-CAL) 600 MG TABS tablet Take 600 mg by mouth 2 (two) times daily with a meal.  ? diphenhydrAMINE (BENADRYL) 25 MG tablet Take 50 mg by mouth at bedtime.  ?  diphenhydramine-acetaminophen (TYLENOL PM) 25-500 MG TABS tablet Take 2 tablets by mouth at bedtime as needed (For pain.).  ? empagliflozin (JARDIANCE) 25 MG TABS tablet Take 1 tablet (25 mg total) by mouth daily before breakfast.  ? fluticasone (FLONASE) 50 MCG/ACT nasal spray PLACE TWO SPRAYS INTO BOTH NOSTRILS ONCE DAILY  ? glimepiride (AMARYL) 1 MG tablet TAKE ONE TABLET BY MOUTH WITH BREAKFAST  ? insulin detemir (LEVEMIR) 100 UNIT/ML FlexPen Inject 20 Units into the skin daily at 10 pm.  ? Insulin Pen Needle (TRUEPLUS PEN NEEDLES) 31G X 5 MM MISC USE TO INJECT 20 UNITS of levemir AT BEDTIME  ? Melatonin 5 MG TABS Take 5 mg by mouth at bedtime as needed (sleep).   ? montelukast (SINGULAIR) 10 MG tablet Take 1 tablet (10 mg total) by mouth at bedtime.  ? Multiple Vitamin (MULTIVITAMIN) tablet Take 1 tablet by mouth daily.  ? Respiratory Therapy Supplies (NEBULIZER/TUBING/MOUTHPIECE) KIT Use with nebulizer  ? Syringe/Needle, Disp, (SYRINGE 3CC/21GX1") 21G X 1" 3 ML MISC Use for B12 injection  ? tiZANidine (ZANAFLEX) 2 MG tablet Take 1 tablet (2 mg total) by mouth 2 (two) times daily as needed for muscle spasms.  ? TRELEGY ELLIPTA 100-62.5-25 MCG/ACT AEPB INHALE ONE PUFF into THE lungs ONCE DAILY  ? [DISCONTINUED] budesonide (PULMICORT) 0.5 MG/2ML nebulizer solution Take 2 mLs (0.5 mg total) by nebulization 2 (two) times daily. (Patient not taking: Reported on 01/17/2020)  ? [DISCONTINUED]  ipratropium-albuterol (DUONEB) 0.5-2.5 (3) MG/3ML SOLN Take 3 mLs by nebulization every 4 (four) hours as needed (shortness of breath). (Patient not taking: Reported on 01/17/2020)  ? ?No facility-administered encounter medications on file as of 12/23/2021.  ? ? ?Physical Exam: ?Blood pressure 138/78, pulse 89, temperature 98 ?F (36.7 ?C), temperature source Oral, height $RemoveBefo'5\' 2"'oAEalyyGiPq$  (1.575 m), weight 193 lb 3.2 oz (87.6 kg), SpO2 90 %. ?Gen:      No acute distress ?HEENT:  EOMI, sclera anicteric ?Neck:     No masses; no  thyromegaly ?Lungs:    Clear to auscultation bilaterally; normal respiratory effort ?CV:         Regular rate and rhythm; no murmurs ?Abd:      + bowel sounds; soft, non-tender; no palpable masses, no distension ?Ext:    No edema; adequate peripheral perfusion ?Skin:      Warm and dry; no rash ?Neuro: alert and oriented x 3 ?Psych: normal mood and affect  ? ?Data Reviewed: ?Imaging: ?CT 06/21/2015- emphysema, 6 mm left lower lobe nodule. ?CT 03/31/2018- emphysema, resolution of 6 mm left lower lobe nodule.  Coronary atherosclerosis. ?Reviewed the images personally. ? ?PFTs: ?05/23/2014 ?FVC 1.71 (50%], FEV1 0.73 [32%], F/F 43, TLC 115%, DLCO 47% ?Severe obstruction, little response to bronchodilator, air trapping ?Severe diffusion defect ? ?Labs: ?CBC 02/08/2018-WBC 8.6, eos 0% ? ?Sleep:  ?PSG 09/04/2015- ?No obstructive sleep apnea, AHI 4.1, moderate oxygen desaturation to 81%.  Corrected by 2 L oxygen. ? ?Assessment:  ?Severe COPD ?Symptoms improved with trelegy.  Continue the same ?Continue supplemental oxygen.  Evaluate for portable concentrator ? ?Subcentimeter pulmonary nodule ?Resolved on follow-up CT.  She will need to start low-dose screening CT ? ?Health maintenance ?07/01/2016-Prevnar ?07/06/2014-Pneumovax ? ?Plan/Recommendations: ?- Renew trelegy, continue supplemental oxygen ?- Evaluate for portable concentrator ?- Refer for low-dose screening CT of chest. ? ?Marshell Garfinkel MD ?[facility] ?12/23/2021, 11:13 AM ? ?CC: Hoyt Koch, * ? ? ?

## 2021-12-23 NOTE — Addendum Note (Signed)
Addended by: Elton Sin on: 12/23/2021 12:01 PM ? ? Modules accepted: Orders ? ?

## 2021-12-23 NOTE — Addendum Note (Signed)
Addended by: Elton Sin on: 12/23/2021 11:34 AM ? ? Modules accepted: Orders ? ?

## 2021-12-23 NOTE — Patient Instructions (Signed)
Continue Trelegy inhaler ?We will try to qualify you for a portable concentrator ?Refer for low-dose screening CT ?Follow-up in 1 year ?

## 2021-12-23 NOTE — Addendum Note (Signed)
Addended byChilton Greathouse on: 12/23/2021 11:37 AM ? ? Modules accepted: Level of Service ? ?

## 2021-12-26 ENCOUNTER — Ambulatory Visit (INDEPENDENT_AMBULATORY_CARE_PROVIDER_SITE_OTHER): Payer: Medicare Other

## 2021-12-26 ENCOUNTER — Other Ambulatory Visit: Payer: Self-pay | Admitting: Internal Medicine

## 2021-12-26 DIAGNOSIS — E1169 Type 2 diabetes mellitus with other specified complication: Secondary | ICD-10-CM

## 2021-12-26 DIAGNOSIS — J9601 Acute respiratory failure with hypoxia: Secondary | ICD-10-CM

## 2021-12-26 DIAGNOSIS — I10 Essential (primary) hypertension: Secondary | ICD-10-CM

## 2021-12-26 DIAGNOSIS — I5032 Chronic diastolic (congestive) heart failure: Secondary | ICD-10-CM

## 2021-12-26 DIAGNOSIS — E118 Type 2 diabetes mellitus with unspecified complications: Secondary | ICD-10-CM

## 2021-12-26 NOTE — Progress Notes (Signed)
? ?Chronic Care Management ?Pharmacy Note ? ?12/26/2021 ?Name:  Yvonne Todd MRN:  [id number] DOB:  09-24-1950 ? ?Summary: ?-Patient reports compliance to current medications, denies any issues with medications at this time  ?-Feels breathing is under control, using trelegy as directed, using albuterol ~2 times per day in warmer months / allergy season ?-BP/ HR at goal, well controlled with most recent office visits ?-A1c at goal since increasing jardiance and starting glimepiride  ? ?Recommendations/Changes made from today's visit: ?-Recommended for patient to avoid using tylenol PM and benadryl together due to concern for excess sedation / increased risk of fall ?-Recommended for patient to increase lean protein / avoid fried and fatty foods to help lower cholesterol, would recommend repeat lipid panel with next PCP visit, consider increase in atorvastatin should LDL remain elevated  ? ?Plan: ?-F/u in 8 months  ? ?Subjective: ?Yvonne Todd is an 71 y.o. year old female who is a primary patient of Hoyt Koch, MD.  The CCM team was consulted for assistance with disease management and care coordination needs.   ? ?Engaged with patient by telephone for follow up visit in response to provider referral for pharmacy case management and/or care coordination services.  ? ?Consent to Services:  ?The patient was given the following information about Chronic Care Management services today, agreed to services, and gave verbal consent: 1. CCM service includes personalized support from designated clinical staff supervised by the primary care provider, including individualized plan of care and coordination with other care providers 2. 24/7 contact phone numbers for assistance for urgent and routine care needs. 3. Service will only be billed when office clinical staff spend 20 minutes or more in a month to coordinate care. 4. Only one practitioner may furnish and bill the service in a calendar month. 5.The patient may  stop CCM services at any time (effective at the end of the month) by phone call to the office staff. 6. The patient will be responsible for cost sharing (co-pay) of up to 20% of the service fee (after annual deductible is met). Patient agreed to services and consent obtained. ? ?Patient Care Team: ?Hoyt Koch, MD as PCP - General (Internal Medicine) ?Tomasa Blase, [facility] (Pharmacist) ? ?Recent office visits: ?11/29/2021 - Dr. Sharlet Salina - no changes to medications  ?09/20/2021 - Dr. Sharlet Salina - increase jardiance to $RemoveBefo'25mg'CWCBlNKHTHM$  daily, start glimepiride $RemoveBeforeDEI'1mg'ZdGRUCKXgrsedrGt$  daily  ? ?Recent consult visits: ?12/23/2021 - Dr. Vaughan Browner - Pulmonology - to be evaluated for portable concentrator - no changes to medications  ? ?Hospital visits: ?None in previous 6 months ? ?Objective: ? ?Lab Results  ?Component Value Date  ? CREATININE 0.48 02/18/2021  ? BUN 15 02/18/2021  ? GFR 95.58 02/18/2021  ? GFRNONAA >60 02/05/2018  ? GFRAA >60 02/05/2018  ? NA 141 02/18/2021  ? K 4.1 02/18/2021  ? CALCIUM 10.1 02/18/2021  ? CO2 39 (H) 02/18/2021  ? GLUCOSE 265 (H) 02/18/2021  ? ? ?Lab Results  ?Component Value Date/Time  ? HGBA1C 6.5 11/26/2021 12:00 AM  ? HGBA1C 11.4 (A) 09/20/2021 08:58 AM  ? HGBA1C 9.8 (H) 02/18/2021 10:00 AM  ? HGBA1C 7.1 (A) 09/08/2019 01:26 PM  ? HGBA1C 7.9 (H) 03/09/2019 08:42 AM  ? GFR 95.58 02/18/2021 10:00 AM  ? GFR 116.90 03/09/2019 08:42 AM  ? MICROALBUR 4.0 (H) 02/18/2021 10:00 AM  ?  ?Last diabetic Eye exam:  ?No results found for: HMDIABEYEEXA  ?Last diabetic Foot exam:  ?No results found for: HMDIABFOOTEX  ? ?  Lab Results  ?Component Value Date  ? CHOL 196 02/18/2021  ? HDL 52.30 02/18/2021  ? LDLCALC 111 (H) 02/18/2021  ? TRIG 167.0 (H) 02/18/2021  ? CHOLHDL 4 02/18/2021  ? ? ? ?  Latest Ref Rng & Units 02/18/2021  ? 10:00 AM 03/09/2019  ?  8:42 AM 02/02/2018  ? 10:42 AM  ?Hepatic Function  ?Total Protein 6.0 - 8.3 g/dL 7.2   6.8   7.4    ?Albumin 3.5 - 5.2 g/dL 4.5   4.3   4.0    ?AST 0 - 37 U/L $Remo'15   14   23    'DcDGc$ ?ALT 0 - 35  U/L $Re'18   16   15    'BgA$ ?Alk Phosphatase 39 - 117 U/L 82   71   65    ?Total Bilirubin 0.2 - 1.2 mg/dL 0.4   0.4   0.6    ? ? ?Lab Results  ?Component Value Date/Time  ? TSH 1.11 02/18/2021 10:00 AM  ? TSH 0.318 (L) 04/14/2017 04:53 AM  ? TSH 0.688 03/19/2014 06:53 PM  ? FREET4 0.78 04/15/2017 04:47 AM  ? ? ? ?  Latest Ref Rng & Units 02/18/2021  ? 10:00 AM 03/09/2019  ?  8:42 AM 02/05/2018  ?  5:56 AM  ?CBC  ?WBC 4.0 - 10.5 K/uL 7.9   5.4   8.6    ?Hemoglobin 12.0 - 15.0 g/dL 13.4   13.5   14.4    ?Hematocrit 36.0 - 46.0 % 39.8   40.4   44.2    ?Platelets 150.0 - 400.0 K/uL 273.0   243.0   284    ? ? ?Lab Results  ?Component Value Date/Time  ? VD25OH 21.15 (L) 02/18/2021 10:00 AM  ? ? ?Clinical ASCVD: No  ?The 10-year ASCVD risk score (Arnett DK, et al., 2019) is: 28.8% ?  Values used to calculate the score: ?    Age: 39 years ?    Sex: Female ?    Is Non-Hispanic African American: No ?    Diabetic: Yes ?    Tobacco smoker: No ?    Systolic Blood Pressure: 800 mmHg ?    Is BP treated: Yes ?    HDL Cholesterol: 52.3 mg/dL ?    Total Cholesterol: 196 mg/dL   ? ? ?  06/17/2021  ?  4:25 PM 03/13/2020  ?  1:36 PM 03/09/2019  ?  8:25 AM  ?Depression screen PHQ 2/9  ?Decreased Interest 0 0 0  ?Down, Depressed, Hopeless 0 0 0  ?PHQ - 2 Score 0 0 0  ?  ? ?Social History  ? ?Tobacco Use  ?Smoking Status Former  ? Packs/day: 1.00  ? Years: 40.00  ? Pack years: 40.00  ? Types: Cigarettes  ? Start date: 09/01/1973  ? Quit date: 03/19/2018  ? Years since quitting: 3.7  ?Smokeless Tobacco Never  ? ?BP Readings from Last 3 Encounters:  ?12/23/21 138/78  ?11/29/21 130/78  ?09/20/21 130/80  ? ?Pulse Readings from Last 3 Encounters:  ?12/23/21 89  ?11/29/21 78  ?09/20/21 88  ? ?Wt Readings from Last 3 Encounters:  ?12/23/21 193 lb 3.2 oz (87.6 kg)  ?11/29/21 199 lb 9.6 oz (90.5 kg)  ?09/20/21 188 lb 12.8 oz (85.6 kg)  ? ?BMI Readings from Last 3 Encounters:  ?12/23/21 35.34 kg/m?  ?11/29/21 35.36 kg/m?  ?09/20/21 33.44 kg/m?   ? ? ?Assessment/Interventions: Review of patient past medical history, allergies, medications, health status, including  review of consultants reports, laboratory and other test data, was performed as part of comprehensive evaluation and provision of chronic care management services.  ? ?SDOH:  (Social Determinants of Health) assessments and interventions performed: Yes ? ?SDOH Screenings  ? ?Alcohol Screen: Low Risk   ? Last Alcohol Screening Score (AUDIT): 0  ?Depression (PHQ2-9): Low Risk   ? PHQ-2 Score: 0  ?Financial Resource Strain: Low Risk   ? Difficulty of Paying Living Expenses: Not hard at all  ?Food Insecurity: No Food Insecurity  ? Worried About Charity fundraiser in the Last Year: Never true  ? Ran Out of Food in the Last Year: Never true  ?Housing: Low Risk   ? Last Housing Risk Score: 0  ?Physical Activity: Inactive  ? Days of Exercise per Week: 0 days  ? Minutes of Exercise per Session: 0 min  ?Social Connections: Moderately Integrated  ? Frequency of Communication with Friends and Family: More than three times a week  ? Frequency of Social Gatherings with Friends and Family: More than three times a week  ? Attends Religious Services: 1 to 4 times per year  ? Active Member of Clubs or Organizations: Yes  ? Attends Archivist Meetings: 1 to 4 times per year  ? Marital Status: Never married  ?Stress: No Stress Concern Present  ? Feeling of Stress : Not at all  ?Tobacco Use: Medium Risk  ? Smoking Tobacco Use: Former  ? Smokeless Tobacco Use: Never  ? Passive Exposure: Not on file  ?Transportation Needs: No Transportation Needs  ? Lack of Transportation (Medical): No  ? Lack of Transportation (Non-Medical): No  ? ? ?CCM Care Plan ? ?Allergies  ?Allergen Reactions  ? Fish Allergy Rash and Other (See Comments)  ?  Some types of fish with iodine causes rash  ? Iodine Rash  ? Penicillins Rash and Other (See Comments)  ?  Has patient had a PCN reaction causing immediate rash, facial/tongue/throat  swelling, SOB or lightheadedness with hypotension: yes ?Has patient had a PCN reaction causing severe rash involving mucus membranes or skin necrosis: no ?Has patient had a PCN reaction that required hospitalization: no ?Lamonte Sakai

## 2021-12-26 NOTE — Patient Instructions (Signed)
Visit Information ? ?Following are the goals we discussed today:  ? ?Manage My Medicine  ? ?Timeframe:  Long-Range Goal ?Priority:  Medium ?Start Date:   12/26/2021                         ?Expected End Date:   12/27/2022                   ? ?Follow Up Date 08/27/2022 ?  ?- call for medicine refill 2 or 3 days before it runs out ?- call if I am sick and can't take my medicine ?- keep a list of all the medicines I take; vitamins and herbals too ?- learn to read medicine labels  ?  ?Why is this important?   ?These steps will help you keep on track with your medicines. ? ?Plan: Telephone follow up appointment with care management team member scheduled for:  8 months ?The patient has been provided with contact information for the care management team and has been advised to call with any health related questions or concerns.  ? ?Ellin Saba, PharmD ?Clinical Pharmacist,  [facility]  ? ?Please call the care guide team at [phone] if you need to cancel or reschedule your appointment.  ? ?Patient verbalizes understanding of instructions and care plan provided today and agrees to view in MyChart. Active MyChart status confirmed with patient.   ? ?

## 2021-12-29 DIAGNOSIS — E785 Hyperlipidemia, unspecified: Secondary | ICD-10-CM | POA: Diagnosis not present

## 2021-12-29 DIAGNOSIS — E1159 Type 2 diabetes mellitus with other circulatory complications: Secondary | ICD-10-CM

## 2021-12-29 DIAGNOSIS — Z7984 Long term (current) use of oral hypoglycemic drugs: Secondary | ICD-10-CM | POA: Diagnosis not present

## 2021-12-29 DIAGNOSIS — I110 Hypertensive heart disease with heart failure: Secondary | ICD-10-CM | POA: Diagnosis not present

## 2021-12-29 DIAGNOSIS — I5030 Unspecified diastolic (congestive) heart failure: Secondary | ICD-10-CM | POA: Diagnosis not present

## 2021-12-29 DIAGNOSIS — J449 Chronic obstructive pulmonary disease, unspecified: Secondary | ICD-10-CM

## 2021-12-29 DIAGNOSIS — Z794 Long term (current) use of insulin: Secondary | ICD-10-CM | POA: Diagnosis not present

## 2022-01-02 DIAGNOSIS — J9611 Chronic respiratory failure with hypoxia: Secondary | ICD-10-CM | POA: Diagnosis not present

## 2022-01-02 DIAGNOSIS — J449 Chronic obstructive pulmonary disease, unspecified: Secondary | ICD-10-CM | POA: Diagnosis not present

## 2022-02-02 DIAGNOSIS — J449 Chronic obstructive pulmonary disease, unspecified: Secondary | ICD-10-CM | POA: Diagnosis not present

## 2022-02-02 DIAGNOSIS — J9611 Chronic respiratory failure with hypoxia: Secondary | ICD-10-CM | POA: Diagnosis not present

## 2022-02-04 ENCOUNTER — Other Ambulatory Visit: Payer: Self-pay

## 2022-02-04 ENCOUNTER — Other Ambulatory Visit: Payer: Self-pay | Admitting: Internal Medicine

## 2022-02-04 DIAGNOSIS — Z87891 Personal history of nicotine dependence: Secondary | ICD-10-CM

## 2022-02-04 DIAGNOSIS — J9601 Acute respiratory failure with hypoxia: Secondary | ICD-10-CM

## 2022-02-04 DIAGNOSIS — Z122 Encounter for screening for malignant neoplasm of respiratory organs: Secondary | ICD-10-CM

## 2022-02-09 ENCOUNTER — Other Ambulatory Visit: Payer: Self-pay | Admitting: Internal Medicine

## 2022-02-09 DIAGNOSIS — J9601 Acute respiratory failure with hypoxia: Secondary | ICD-10-CM

## 2022-02-09 DIAGNOSIS — I10 Essential (primary) hypertension: Secondary | ICD-10-CM

## 2022-02-09 DIAGNOSIS — J449 Chronic obstructive pulmonary disease, unspecified: Secondary | ICD-10-CM

## 2022-03-04 DIAGNOSIS — J9611 Chronic respiratory failure with hypoxia: Secondary | ICD-10-CM | POA: Diagnosis not present

## 2022-03-04 DIAGNOSIS — J449 Chronic obstructive pulmonary disease, unspecified: Secondary | ICD-10-CM | POA: Diagnosis not present

## 2022-03-05 ENCOUNTER — Encounter: Payer: Self-pay | Admitting: Acute Care

## 2022-03-05 ENCOUNTER — Ambulatory Visit (INDEPENDENT_AMBULATORY_CARE_PROVIDER_SITE_OTHER): Payer: Medicare Other | Admitting: Acute Care

## 2022-03-05 DIAGNOSIS — Z87891 Personal history of nicotine dependence: Secondary | ICD-10-CM

## 2022-03-05 NOTE — Progress Notes (Signed)
Virtual Visit via Telephone Note  I connected with [name] on 07/16/21 at  2:00 PM EST by telephone and verified that I am speaking with the correct person using two identifiers.  Location: Patient: Home  Provider: Working form home   I discussed the limitations, risks, security and privacy concerns of performing an evaluation and management service by telephone and the availability of in person appointments. I also discussed with the patient that there may be a patient responsible charge related to this service. The patient expressed understanding and agreed to proceed.  Shared Decision Making Visit Lung Cancer Screening Program [phone])   Eligibility: Age 71 y.o. Pack Years Smoking History Calculation 51 (# packs/per year x # years smoked) Recent History of coughing up blood  no Unexplained weight loss? no ( >Than 15 pounds within the last 6 months ) Prior History Lung / other cancer no (Diagnosis within the last 5 years already requiring surveillance chest CT Scans). Smoking Status Former Smoker Former Smokers: Years since quit: 4 years  Quit Date: 2019  Visit Components: Discussion included one or more decision making aids. yes Discussion included risk/benefits of screening. yes Discussion included potential follow up diagnostic testing for abnormal scans. yes Discussion included meaning and risk of over diagnosis. yes Discussion included meaning and risk of False Positives. yes Discussion included meaning of total radiation exposure. yes  Counseling Included: Importance of adherence to annual lung cancer LDCT screening. yes Impact of comorbidities on ability to participate in the program. yes Ability and willingness to under diagnostic treatment. yes  Smoking Cessation Counseling: Current Smokers:  Discussed importance of smoking cessation. yes Information about tobacco cessation classes and interventions provided to patient. yes Patient provided with "ticket"  for LDCT Scan. yes Symptomatic Patient. no  Counseling NA Diagnosis Code: Tobacco Use Z72.0 Asymptomatic Patient yes  Counseling NA Former Smokers:  Discussed the importance of maintaining cigarette abstinence. yes Diagnosis Code: Personal History of Nicotine Dependence. B01.751 Information about tobacco cessation classes and interventions provided to patient. Yes Patient provided with "ticket" for LDCT Scan. yes Written Order for Lung Cancer Screening with LDCT placed in Epic. Yes (CT Chest Lung Cancer Screening Low Dose W/O CM) [id number] Z12.2-Screening of respiratory organs Z87.891-Personal history of nicotine dependence   I spent 25 minutes of face to face time with her discussing the risks and benefits of lung cancer screening. We viewed a power point together that explained in detail the above noted topics. We took the time to pause the power point at intervals to allow for questions to be asked and answered to ensure understanding. We discussed that she had taken the single most powerful action possible to decrease her risk of developing lung cancer when she quit smoking. I counseled her to remain smoke free, and to contact me if she ever had the desire to smoke again so that I can provide resources and tools to help support the effort to remain smoke free. We discussed the time and location of the scan, and that either  Abigail Miyamoto RN or I will call with the results within  24-48 hours of receiving them. She has my card and contact information in the event she needs to speak with me, in addition to a copy of the power point we reviewed as a resource. She verbalized understanding of all of the above and had no further questions upon leaving the office.     I explained to the patient that there has been a high incidence  of coronary artery disease noted on these exams. I explained that this is a non-gated exam therefore degree or severity cannot be determined. This patient is on statin  therapy. I have asked the patient to follow-up with their PCP regarding any incidental finding of coronary artery disease and management with diet or medication as they feel is clinically indicated. The patient verbalized understanding of the above and had no further questions.   Annelise Mccoy D. Tiburcio Pea, NP-C [facility] Personal contact information can be found on Amion  03/05/2022, 9:39 AM

## 2022-03-05 NOTE — Patient Instructions (Signed)
Thank you for participating in the [facility]. It was our pleasure to meet you today. We will call you with the results of your scan within the next few days. Your scan will be assigned a Lung RADS category score by the physicians reading the scans.  This Lung RADS score determines follow up scanning.  See below for description of categories, and follow up screening recommendations. We will be in touch to schedule your follow up screening annually or based on recommendations of our providers. We will fax a copy of your scan results to your Primary Care Physician, or the physician who referred you to the program, to ensure they have the results. Please call the office if you have any questions or concerns regarding your scanning experience or results.  Our office number is [phone]. Please speak with Denise Phelps, RN. , or  Denise Buckner RN, They are  our Lung Cancer Screening RN.'s If They are unavailable when you call, Please leave a message on the voice mail. We will return your call at our earliest convenience.This voice mail is monitored several times a day.  Remember, if your scan is normal, we will scan you annually as long as you continue to meet the criteria for the program. (Age 55-77, Current smoker or smoker who has quit within the last 15 years). If you are a smoker, remember, quitting is the single most powerful action that you can take to decrease your risk of lung cancer and other pulmonary, breathing related problems. We know quitting is hard, and we are here to help.  Please let us know if there is anything we can do to help you meet your goal of quitting. If you are a former smoker, congratulations. We are proud of you! Remain smoke free! Remember you can refer friends or family members through the number above.  We will screen them to make sure they meet criteria for the program. Thank you for helping us take better care of you by  participating in Lung Screening.  You can receive free nicotine replacement therapy ( patches, gum or mints) by calling 1-800-QUIT NOW. Please call so we can get you on the path to becoming  a non-smoker. I know it is hard, but you can do this!  Lung RADS Categories:  Lung RADS 1: no nodules or definitely non-concerning nodules.  Recommendation is for a repeat annual scan in 12 months.  Lung RADS 2:  nodules that are non-concerning in appearance and behavior with a very low likelihood of becoming an active cancer. Recommendation is for a repeat annual scan in 12 months.  Lung RADS 3: nodules that are probably non-concerning , includes nodules with a low likelihood of becoming an active cancer.  Recommendation is for a 6-month repeat screening scan. Often noted after an upper respiratory illness. We will be in touch to make sure you have no questions, and to schedule your 6-month scan.  Lung RADS 4 A: nodules with concerning findings, recommendation is most often for a follow up scan in 3 months or additional testing based on our provider's assessment of the scan. We will be in touch to make sure you have no questions and to schedule the recommended 3 month follow up scan.  Lung RADS 4 B:  indicates findings that are concerning. We will be in touch with you to schedule additional diagnostic testing based on our provider's  assessment of the scan.  Other options for assistance in smoking cessation (   As covered by your insurance benefits)  Hypnosis for smoking cessation  [employer] [phone]  Acupuncture for smoking cessation  [facility] [phone]   

## 2022-03-06 ENCOUNTER — Ambulatory Visit
Admission: RE | Admit: 2022-03-06 | Discharge: 2022-03-06 | Disposition: A | Discharge status: Home / Self Care | Payer: Medicare Other | Source: Ambulatory Visit | Attending: Acute Care | Admitting: Acute Care

## 2022-03-06 DIAGNOSIS — Z122 Encounter for screening for malignant neoplasm of respiratory organs: Secondary | ICD-10-CM

## 2022-03-06 DIAGNOSIS — K8020 Calculus of gallbladder without cholecystitis without obstruction: Secondary | ICD-10-CM | POA: Diagnosis not present

## 2022-03-06 DIAGNOSIS — Z87891 Personal history of nicotine dependence: Secondary | ICD-10-CM | POA: Diagnosis not present

## 2022-03-06 DIAGNOSIS — I2510 Atherosclerotic heart disease of native coronary artery without angina pectoris: Secondary | ICD-10-CM | POA: Diagnosis not present

## 2022-03-06 DIAGNOSIS — J432 Centrilobular emphysema: Secondary | ICD-10-CM | POA: Diagnosis not present

## 2022-03-07 ENCOUNTER — Other Ambulatory Visit: Payer: Self-pay | Admitting: Acute Care

## 2022-03-07 DIAGNOSIS — Z122 Encounter for screening for malignant neoplasm of respiratory organs: Secondary | ICD-10-CM

## 2022-03-07 DIAGNOSIS — Z87891 Personal history of nicotine dependence: Secondary | ICD-10-CM

## 2022-03-16 ENCOUNTER — Other Ambulatory Visit: Payer: Self-pay | Admitting: Internal Medicine

## 2022-03-16 DIAGNOSIS — J9601 Acute respiratory failure with hypoxia: Secondary | ICD-10-CM

## 2022-04-02 ENCOUNTER — Other Ambulatory Visit: Payer: Self-pay | Admitting: Internal Medicine

## 2022-04-02 DIAGNOSIS — J9601 Acute respiratory failure with hypoxia: Secondary | ICD-10-CM

## 2022-04-04 DIAGNOSIS — J9611 Chronic respiratory failure with hypoxia: Secondary | ICD-10-CM | POA: Diagnosis not present

## 2022-04-04 DIAGNOSIS — J449 Chronic obstructive pulmonary disease, unspecified: Secondary | ICD-10-CM | POA: Diagnosis not present

## 2022-05-05 DIAGNOSIS — J449 Chronic obstructive pulmonary disease, unspecified: Secondary | ICD-10-CM | POA: Diagnosis not present

## 2022-05-05 DIAGNOSIS — J9611 Chronic respiratory failure with hypoxia: Secondary | ICD-10-CM | POA: Diagnosis not present

## 2022-05-06 ENCOUNTER — Ambulatory Visit (INDEPENDENT_AMBULATORY_CARE_PROVIDER_SITE_OTHER): Payer: Medicare Other | Admitting: Internal Medicine

## 2022-05-06 ENCOUNTER — Other Ambulatory Visit: Payer: Self-pay | Admitting: Internal Medicine

## 2022-05-06 ENCOUNTER — Encounter: Payer: Self-pay | Admitting: Internal Medicine

## 2022-05-06 VITALS — BP 142/80 | HR 71 | Temp 98.6°F | Ht 62.0 in | Wt 200.0 lb

## 2022-05-06 DIAGNOSIS — E118 Type 2 diabetes mellitus with unspecified complications: Secondary | ICD-10-CM

## 2022-05-06 DIAGNOSIS — M7989 Other specified soft tissue disorders: Secondary | ICD-10-CM | POA: Diagnosis not present

## 2022-05-06 DIAGNOSIS — E1169 Type 2 diabetes mellitus with other specified complication: Secondary | ICD-10-CM | POA: Diagnosis not present

## 2022-05-06 DIAGNOSIS — J9611 Chronic respiratory failure with hypoxia: Secondary | ICD-10-CM

## 2022-05-06 DIAGNOSIS — E785 Hyperlipidemia, unspecified: Secondary | ICD-10-CM | POA: Diagnosis not present

## 2022-05-06 LAB — LIPID PANEL
Cholesterol: 199 mg/dL (ref 0–200)
HDL: 43.4 mg/dL (ref >39.00)
NonHDL: 155.85
Total CHOL/HDL Ratio: 5
Triglycerides: 282 mg/dL — ABNORMAL HIGH (ref 0.0–149.0)
VLDL: 56.4 mg/dL — ABNORMAL HIGH (ref 0.0–40.0)

## 2022-05-06 LAB — MICROALBUMIN / CREATININE URINE RATIO
Creatinine,U: 34 mg/dL
Microalb Creat Ratio: 8.2 mg/g (ref 0.0–30.0)
Microalb, Ur: 2.8 mg/dL — ABNORMAL HIGH (ref 0.0–1.9)

## 2022-05-06 LAB — COMPREHENSIVE METABOLIC PANEL
ALT: 30 U/L (ref 0–35)
AST: 23 U/L (ref 0–37)
Albumin: 4.3 g/dL (ref 3.5–5.2)
Alkaline Phosphatase: 87 U/L (ref 39–117)
BUN: 15 mg/dL (ref 6–23)
CO2: 32 mEq/L (ref 19–32)
Calcium: 9.8 mg/dL (ref 8.4–10.5)
Chloride: 103 mEq/L (ref 96–112)
Creatinine, Ser: 0.56 mg/dL (ref 0.40–1.20)
GFR: 91.31 mL/min (ref >60.00)
Glucose, Bld: 211 mg/dL — ABNORMAL HIGH (ref 70–99)
Potassium: 5 mEq/L (ref 3.5–5.1)
Sodium: 142 mEq/L (ref 135–145)
Total Bilirubin: 0.3 mg/dL (ref 0.2–1.2)
Total Protein: 7.3 g/dL (ref 6.0–8.3)

## 2022-05-06 LAB — CBC
HCT: 38.1 % (ref 36.0–46.0)
Hemoglobin: 12.9 g/dL (ref 12.0–15.0)
MCHC: 33.9 g/dL (ref 30.0–36.0)
MCV: 93.7 fl (ref 78.0–100.0)
Platelets: 260 10*3/uL (ref 150.0–400.0)
RBC: 4.06 Mil/uL (ref 3.87–5.11)
RDW: 13 % (ref 11.5–15.5)
WBC: 5.9 10*3/uL (ref 4.0–10.5)

## 2022-05-06 LAB — HEMOGLOBIN A1C: Hgb A1c MFr Bld: 11 % — ABNORMAL HIGH (ref 4.6–6.5)

## 2022-05-06 LAB — BRAIN NATRIURETIC PEPTIDE: Pro B Natriuretic peptide (BNP): 25 pg/mL (ref 0.0–100.0)

## 2022-05-06 LAB — LDL CHOLESTEROL, DIRECT: Direct LDL: 111 mg/dL

## 2022-05-06 MED ORDER — VALSARTAN 80 MG PO TABS: 80.0000 mg | ORAL_TABLET | Freq: Every day | ORAL | 3 refills | Status: DC

## 2022-05-06 NOTE — Assessment & Plan Note (Signed)
Stable without flare today and no change in oxygen requirement.

## 2022-05-06 NOTE — Assessment & Plan Note (Signed)
Checking lipid panel and adjust lipitor 40 mg daily as needed. 

## 2022-05-06 NOTE — Assessment & Plan Note (Signed)
Checking CMP and BNP to rule out metabolic causes. Could be amlodipine. Will stop amlodipine 10 mg daily and change this with valsartan 80 mg daily instead. Follow up 1-2 months for BP and labs. If labs today abnormal will adjust plan.

## 2022-05-06 NOTE — Patient Instructions (Addendum)
We will check the labs today.  Stop amlodipine.  We have sent in valsartan instead to take 1 pill daily. This should help blood pressure and swelling.  Come back in 1-2 months for a physical to make sure the blood pressure is doing well.

## 2022-05-06 NOTE — Assessment & Plan Note (Signed)
Checking HgA1c, lipid panel and microalbumin to creatinine ratio. Stopping amlodipine and starting valsartan 80 mg daily. Adjust jardiance 25 mg daily and amaryl 1 mg daily as needed. Is on statin.

## 2022-05-06 NOTE — Progress Notes (Signed)
   Subjective:   Patient ID: Yvonne Todd, female    DOB: Feb 05, 1951, 71 y.o.   MRN: [id number]  HPI The patient is a 71 YO female coming in for leg and ankle swelling. Some swelling previously but worse lately.   Review of Systems  Constitutional: Negative.   HENT: Negative.    Eyes: Negative.   Respiratory:  Positive for shortness of breath. Negative for cough and chest tightness.        Stable to baseline  Cardiovascular:  Positive for leg swelling. Negative for chest pain and palpitations.  Gastrointestinal:  Negative for abdominal distention, abdominal pain, constipation, diarrhea, nausea and vomiting.  Musculoskeletal: Negative.   Skin: Negative.   Neurological: Negative.   Psychiatric/Behavioral: Negative.      Objective:  Physical Exam Constitutional:      Appearance: She is well-developed.  HENT:     Head: Normocephalic and atraumatic.  Cardiovascular:     Rate and Rhythm: Normal rate and regular rhythm.  Pulmonary:     Effort: Pulmonary effort is normal. No respiratory distress.     Breath sounds: Normal breath sounds. No wheezing or rales.     Comments: Oxygen via canula Abdominal:     General: Bowel sounds are normal. There is no distension.     Palpations: Abdomen is soft.     Tenderness: There is no abdominal tenderness. There is no rebound.  Musculoskeletal:     Cervical back: Normal range of motion.     Right lower leg: Edema present.     Left lower leg: Edema present.     Comments: 1+ edema pitting  Skin:    General: Skin is warm and dry.  Neurological:     Mental Status: She is alert and oriented to person, place, and time.     Coordination: Coordination normal.     Vitals:   05/06/22 0846 05/06/22 0853  BP: (!) 142/80 (!) 142/80  Pulse: 71   Temp: 98.6 F (37 C)   TempSrc: Oral   SpO2: 97%   Weight: 200 lb (90.7 kg)   Height: 5\' 2"  (1.575 m)     Assessment & Plan:

## 2022-05-14 ENCOUNTER — Telehealth: Payer: Self-pay | Admitting: Internal Medicine

## 2022-05-14 NOTE — Telephone Encounter (Signed)
Pt is requesting a nurse call her back to discuss her most recent labwork from 9.5.23  Please call Yvonne Todd at: [phone]

## 2022-05-15 ENCOUNTER — Telehealth: Payer: Self-pay | Admitting: *Deleted

## 2022-05-15 NOTE — Telephone Encounter (Signed)
Please call patient - she called again today and states that she needs to speak with some concerning her pharmacy

## 2022-05-15 NOTE — Telephone Encounter (Deleted)
Notified pt--Short Term Disability Form-is ready to pick up at the front office.         

## 2022-05-15 NOTE — Telephone Encounter (Signed)
Spoke to pt regarding lab results

## 2022-05-16 ENCOUNTER — Other Ambulatory Visit: Payer: Self-pay | Admitting: Internal Medicine

## 2022-05-16 MED ORDER — ATORVASTATIN CALCIUM 80 MG PO TABS
80.0000 mg | ORAL_TABLET | Freq: Every day | ORAL | 3 refills | Status: DC
Start: 2022-05-16 — End: 2023-05-11

## 2022-05-16 MED ORDER — GLIMEPIRIDE 2 MG PO TABS: 2.0000 mg | ORAL_TABLET | Freq: Every day | ORAL | 1 refills | Status: DC

## 2022-05-23 ENCOUNTER — Ambulatory Visit: Payer: Medicare Other | Admitting: Internal Medicine

## 2022-05-26 ENCOUNTER — Telehealth: Payer: Self-pay | Admitting: Internal Medicine

## 2022-05-26 NOTE — Telephone Encounter (Signed)
PT calls today in need of a refill on their valsartan (DIOVAN) 80 MG tablet. They were able to get a partial form their pharmacy the last time they visited but are not completely out. They did refill PT's amLODipine but PT is afraid to take this as it caused swelling in their feet the last time and they were advised against this.  CB: [phone]

## 2022-05-28 NOTE — Telephone Encounter (Signed)
Notified pt --valsartan have 3 refills and call the pharmacy. Pt voiced understanding.

## 2022-05-29 ENCOUNTER — Other Ambulatory Visit: Payer: Self-pay | Admitting: Internal Medicine

## 2022-05-29 DIAGNOSIS — Z1231 Encounter for screening mammogram for malignant neoplasm of breast: Secondary | ICD-10-CM

## 2022-06-04 DIAGNOSIS — J449 Chronic obstructive pulmonary disease, unspecified: Secondary | ICD-10-CM | POA: Diagnosis not present

## 2022-06-04 DIAGNOSIS — J9611 Chronic respiratory failure with hypoxia: Secondary | ICD-10-CM | POA: Diagnosis not present

## 2022-06-17 ENCOUNTER — Encounter: Payer: Self-pay | Admitting: Internal Medicine

## 2022-06-17 ENCOUNTER — Ambulatory Visit (INDEPENDENT_AMBULATORY_CARE_PROVIDER_SITE_OTHER): Payer: Medicare Other | Admitting: Internal Medicine

## 2022-06-17 VITALS — BP 120/84 | HR 77 | Temp 98.2°F | Ht 62.0 in | Wt 206.0 lb

## 2022-06-17 DIAGNOSIS — E118 Type 2 diabetes mellitus with unspecified complications: Secondary | ICD-10-CM

## 2022-06-17 DIAGNOSIS — M7989 Other specified soft tissue disorders: Secondary | ICD-10-CM

## 2022-06-17 DIAGNOSIS — Z23 Encounter for immunization: Secondary | ICD-10-CM

## 2022-06-17 MED ORDER — MELOXICAM 15 MG PO TABS: 15.0000 mg | ORAL_TABLET | Freq: Every day | ORAL | 0 refills | Status: DC

## 2022-06-17 MED ORDER — FUROSEMIDE 20 MG PO TABS: 20.0000 mg | ORAL_TABLET | Freq: Every day | ORAL | 3 refills | Status: DC

## 2022-06-17 NOTE — Progress Notes (Addendum)
   Subjective:   Patient ID: [name], female    DOB: [birth year], 71 y.o.   MRN: [id number]  HPI The patient is a 71 YO female coming in for foot swelling. Stopped amlodipine and started valsartan about 2-3 weeks ago.   Review of Systems  Constitutional: Negative.   HENT: Negative.    Eyes: Negative.   Respiratory:  Negative for cough, chest tightness and shortness of breath.   Cardiovascular:  Negative for chest pain, palpitations and leg swelling.  Gastrointestinal:  Negative for abdominal distention, abdominal pain, constipation, diarrhea, nausea and vomiting.  Musculoskeletal:  Positive for arthralgias, joint swelling and myalgias.  Skin: Negative.   Neurological: Negative.   Psychiatric/Behavioral: Negative.      Objective:  Physical Exam Constitutional:      Appearance: She is well-developed.  HENT:     Head: Normocephalic and atraumatic.  Cardiovascular:     Rate and Rhythm: Normal rate and regular rhythm.  Pulmonary:     Effort: Pulmonary effort is normal. No respiratory distress.     Breath sounds: Normal breath sounds. No wheezing or rales.  Abdominal:     General: Bowel sounds are normal. There is no distension.     Palpations: Abdomen is soft.     Tenderness: There is no abdominal tenderness. There is no rebound.  Musculoskeletal:        General: Tenderness present.     Cervical back: Normal range of motion.     Right lower leg: Edema present.     Left lower leg: Edema present.     Comments: Ankle swelling and left leg swelling to mid shin 1-2+  Skin:    General: Skin is warm and dry.     Comments: Foot exam done  Neurological:     Mental Status: She is alert and oriented to person, place, and time.     Coordination: Coordination normal.     Vitals:   06/17/22 1346 06/17/22 1419  BP: (!) 120/94 120/84  Pulse: 77   Temp: 98.2 F (36.8 C)   TempSrc: Oral   SpO2: 96%   Weight: 206 lb (93.4 kg)   Height: 5\' 2"  (1.575 m)     Assessment & Plan:   Flu shot given at visit

## 2022-06-17 NOTE — Patient Instructions (Signed)
We have sent in lasix which is the fluid pill to take 1 pill daily for 5 days and then as needed.  We have sent in meloxicam to take 1 pill daily for pain.

## 2022-06-17 NOTE — Assessment & Plan Note (Signed)
Appears improved with stopping amlodipine and starting valsartan 80 mg daily. BP at goal. She does have persistent fluid so rx lasix 20 mg daily for 5 days then daily prn for fluid. Rx meloxicam 15 mg daily for pain. Given last Hga1c of 11 would not do prednisone course.

## 2022-06-18 ENCOUNTER — Ambulatory Visit (INDEPENDENT_AMBULATORY_CARE_PROVIDER_SITE_OTHER): Payer: Medicare Other

## 2022-06-18 DIAGNOSIS — Z0000 Encounter for general adult medical examination without abnormal findings: Secondary | ICD-10-CM
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
## 2022-06-18 NOTE — Patient Instructions (Addendum)
Yvonne Todd , Thank you for taking time to come for your Medicare Wellness Visit. I appreciate your ongoing commitment to your health goals. Please review the following plan we discussed and let me know if I can assist you in the future.   These are the goals we discussed:  Goals       Manage My Medicine      Timeframe:  Long-Range Goal Priority:  Medium Start Date:   12/26/2021                         Expected End Date:   12/27/2022                    Follow Up Date 08/27/2022   - call for medicine refill 2 or 3 days before it runs out - call if I am sick and can't take my medicine - keep a list of all the medicines I take; vitamins and herbals too - learn to read medicine labels    Why is this important?   These steps will help you keep on track with your medicines.   Notes:       Patient Stated (pt-stated)      My goal is to still be here and continue to eat healthier.        This is a list of the screening recommended for you and due dates:  Health Maintenance  Topic Date Due   Colon Cancer Screening  Never done   DEXA scan (bone density measurement)  Never done   Eye exam for diabetics  05/16/2017   COVID-19 Vaccine (4 - Pfizer series) 08/09/2021   Mammogram  05/07/2023*   Hemoglobin A1C  11/04/2022   Yearly kidney function blood test for diabetes  05/07/2023   Yearly kidney health urinalysis for diabetes  05/07/2023   Complete foot exam   06/18/2023   Tetanus Vaccine  12/02/2027   Pneumonia Vaccine  Completed   Flu Shot  Completed   Hepatitis C Screening: USPSTF Recommendation to screen - Ages 18-79 yo.  Completed   HPV Vaccine  Aged Out   Zoster (Shingles) Vaccine  Discontinued  *Topic was postponed. The date shown is not the original due date.    Advanced directives: Yes; Please bring a copy of your health care power of attorney and living will to the office at your convenience.  Conditions/risks identified: Yes; Type II Diabetes Mellitus  Next appointment:  Follow up in one year for your annual wellness visit on 06/22/2023 at 1:00 p.m. with Nurse Mignon Pine via telephone.  If you need to cancel or reschedule please call [phone].   Preventive Care 72 Years and Older, Female Preventive care refers to lifestyle choices and visits with your health care provider that can promote health and wellness. What does preventive care include? A yearly physical exam. This is also called an annual well check. Dental exams once or twice a year. Routine eye exams. Ask your health care provider how often you should have your eyes checked. Personal lifestyle choices, including: Daily care of your teeth and gums. Regular physical activity. Eating a healthy diet. Avoiding tobacco and drug use. Limiting alcohol use. Practicing safe sex. Taking low-dose aspirin every day. Taking vitamin and mineral supplements as recommended by your health care provider. What happens during an annual well check? The services and screenings done by your health care provider during your annual well check will depend on your age, overall  health, lifestyle risk factors, and family history of disease. Counseling  Your health care provider may ask you questions about your: Alcohol use. Tobacco use. Drug use. Emotional well-being. Home and relationship well-being. Sexual activity. Eating habits. History of falls. Memory and ability to understand (cognition). Work and work Statistician. Reproductive health. Screening  You may have the following tests or measurements: Height, weight, and BMI. Blood pressure. Lipid and cholesterol levels. These may be checked every 5 years, or more frequently if you are over 26 years old. Skin check. Lung cancer screening. You may have this screening every year starting at age 72 if you have a 30-pack-year history of smoking and currently smoke or have quit within the past 15 years. Fecal occult blood test (FOBT) of the stool. You may have this  test every year starting at age 11. Flexible sigmoidoscopy or colonoscopy. You may have a sigmoidoscopy every 5 years or a colonoscopy every 10 years starting at age 72. Hepatitis C blood test. Hepatitis B blood test. Sexually transmitted disease (STD) testing. Diabetes screening. This is done by checking your blood sugar (glucose) after you have not eaten for a while (fasting). You may have this done every 1-3 years. Bone density scan. This is done to screen for osteoporosis. You may have this done starting at age 72. Mammogram. This may be done every 1-2 years. Talk to your health care provider about how often you should have regular mammograms. Talk with your health care provider about your test results, treatment options, and if necessary, the need for more tests. Vaccines  Your health care provider may recommend certain vaccines, such as: Influenza vaccine. This is recommended every year. Tetanus, diphtheria, and acellular pertussis (Tdap, Td) vaccine. You may need a Td booster every 10 years. Zoster vaccine. You may need this after age 72. Pneumococcal 13-valent conjugate (PCV13) vaccine. One dose is recommended after age 72. Pneumococcal polysaccharide (PPSV23) vaccine. One dose is recommended after age 72. Talk to your health care provider about which screenings and vaccines you need and how often you need them. This information is not intended to replace advice given to you by your health care provider. Make sure you discuss any questions you have with your health care provider. Document Released: 09/14/2015 Document Revised: 05/07/2016 Document Reviewed: 06/19/2015 Elsevier Interactive Patient Education  2017 [place] Prevention in the Home Falls can cause injuries. They can happen to people of all ages. There are many things you can do to make your home safe and to help prevent falls. What can I do on the outside of my home? Regularly fix the edges of walkways and  driveways and fix any cracks. Remove anything that might make you trip as you walk through a door, such as a raised step or threshold. Trim any bushes or trees on the path to your home. Use bright outdoor lighting. Clear any walking paths of anything that might make someone trip, such as rocks or tools. Regularly check to see if handrails are loose or broken. Make sure that both sides of any steps have handrails. Any raised decks and porches should have guardrails on the edges. Have any leaves, snow, or ice cleared regularly. Use sand or salt on walking paths during winter. Clean up any spills in your garage right away. This includes oil or grease spills. What can I do in the bathroom? Use night lights. Install grab bars by the toilet and in the tub and shower. Do not use towel bars  as grab bars. Use non-skid mats or decals in the tub or shower. If you need to sit down in the shower, use a plastic, non-slip stool. Keep the floor dry. Clean up any water that spills on the floor as soon as it happens. Remove soap buildup in the tub or shower regularly. Attach bath mats securely with double-sided non-slip rug tape. Do not have throw rugs and other things on the floor that can make you trip. What can I do in the bedroom? Use night lights. Make sure that you have a light by your bed that is easy to reach. Do not use any sheets or blankets that are too big for your bed. They should not hang down onto the floor. Have a firm chair that has side arms. You can use this for support while you get dressed. Do not have throw rugs and other things on the floor that can make you trip. What can I do in the kitchen? Clean up any spills right away. Avoid walking on wet floors. Keep items that you use a lot in easy-to-reach places. If you need to reach something above you, use a strong step stool that has a grab bar. Keep electrical cords out of the way. Do not use floor polish or wax that makes floors  slippery. If you must use wax, use non-skid floor wax. Do not have throw rugs and other things on the floor that can make you trip. What can I do with my stairs? Do not leave any items on the stairs. Make sure that there are handrails on both sides of the stairs and use them. Fix handrails that are broken or loose. Make sure that handrails are as long as the stairways. Check any carpeting to make sure that it is firmly attached to the stairs. Fix any carpet that is loose or worn. Avoid having throw rugs at the top or bottom of the stairs. If you do have throw rugs, attach them to the floor with carpet tape. Make sure that you have a light switch at the top of the stairs and the bottom of the stairs. If you do not have them, ask someone to add them for you. What else can I do to help prevent falls? Wear shoes that: Do not have high heels. Have rubber bottoms. Are comfortable and fit you well. Are closed at the toe. Do not wear sandals. If you use a stepladder: Make sure that it is fully opened. Do not climb a closed stepladder. Make sure that both sides of the stepladder are locked into place. Ask someone to hold it for you, if possible. Clearly mark and make sure that you can see: Any grab bars or handrails. First and last steps. Where the edge of each step is. Use tools that help you move around (mobility aids) if they are needed. These include: Canes. Walkers. Scooters. Crutches. Turn on the lights when you go into a dark area. Replace any light bulbs as soon as they burn out. Set up your furniture so you have a clear path. Avoid moving your furniture around. If any of your floors are uneven, fix them. If there are any pets around you, be aware of where they are. Review your medicines with your doctor. Some medicines can make you feel dizzy. This can increase your chance of falling. Ask your doctor what other things that you can do to help prevent falls. This information is not  intended to replace advice given to you  by your health care provider. Make sure you discuss any questions you have with your health care provider. Document Released: 06/14/2009 Document Revised: 01/24/2016 Document Reviewed: 09/22/2014 Elsevier Interactive Patient Education  2017 [employer].

## 2022-06-26 ENCOUNTER — Ambulatory Visit: Payer: Medicare Other

## 2022-07-05 DIAGNOSIS — J449 Chronic obstructive pulmonary disease, unspecified: Secondary | ICD-10-CM | POA: Diagnosis not present

## 2022-07-05 DIAGNOSIS — J9611 Chronic respiratory failure with hypoxia: Secondary | ICD-10-CM | POA: Diagnosis not present

## 2022-07-09 ENCOUNTER — Ambulatory Visit: Payer: Medicare Other

## 2022-07-21 LAB — HM DIABETES EYE EXAM

## 2022-08-04 ENCOUNTER — Encounter: Payer: Self-pay | Admitting: Internal Medicine

## 2022-08-04 ENCOUNTER — Ambulatory Visit (INDEPENDENT_AMBULATORY_CARE_PROVIDER_SITE_OTHER): Payer: Medicare Other | Admitting: Internal Medicine

## 2022-08-04 VITALS — BP 140/100 | HR 66 | Temp 97.9°F | Ht 62.0 in | Wt 206.0 lb

## 2022-08-04 DIAGNOSIS — I739 Peripheral vascular disease, unspecified: Secondary | ICD-10-CM

## 2022-08-04 DIAGNOSIS — I5032 Chronic diastolic (congestive) heart failure: Secondary | ICD-10-CM | POA: Diagnosis not present

## 2022-08-04 DIAGNOSIS — Z0000 Encounter for general adult medical examination without abnormal findings: Secondary | ICD-10-CM

## 2022-08-04 DIAGNOSIS — J449 Chronic obstructive pulmonary disease, unspecified: Secondary | ICD-10-CM | POA: Diagnosis not present

## 2022-08-04 DIAGNOSIS — J9611 Chronic respiratory failure with hypoxia: Secondary | ICD-10-CM

## 2022-08-04 DIAGNOSIS — E118 Type 2 diabetes mellitus with unspecified complications: Secondary | ICD-10-CM | POA: Diagnosis not present

## 2022-08-04 DIAGNOSIS — E785 Hyperlipidemia, unspecified: Secondary | ICD-10-CM | POA: Diagnosis not present

## 2022-08-04 DIAGNOSIS — I10 Essential (primary) hypertension: Secondary | ICD-10-CM

## 2022-08-04 DIAGNOSIS — E1169 Type 2 diabetes mellitus with other specified complication: Secondary | ICD-10-CM | POA: Diagnosis not present

## 2022-08-04 LAB — POCT GLYCOSYLATED HEMOGLOBIN (HGB A1C): HbA1c POC (<> result, manual entry): 10.8 % (ref 4.0–5.6)

## 2022-08-04 MED ORDER — FUROSEMIDE 20 MG PO TABS: 20.0000 mg | ORAL_TABLET | Freq: Every day | ORAL | 3 refills | Status: DC

## 2022-08-04 MED ORDER — GLIMEPIRIDE 4 MG PO TABS: 4.0000 mg | ORAL_TABLET | Freq: Every day | ORAL | 1 refills | Status: DC

## 2022-08-04 MED ORDER — MELOXICAM 15 MG PO TABS: 15.0000 mg | ORAL_TABLET | Freq: Every day | ORAL | 3 refills | Status: DC

## 2022-08-04 NOTE — Progress Notes (Signed)
   Subjective:   Patient ID: Yvonne Todd, female    DOB: [birth year], 71 y.o.   MRN: [id number]  HPI The patient is here for physical. Also needs follow up poorly controlled diabetes.   PMH, [facility], social history reviewed and updated  Review of Systems  Constitutional:  Positive for activity change.  HENT: Negative.    Eyes: Negative.   Respiratory:  Positive for shortness of breath. Negative for cough and chest tightness.   Cardiovascular:  Negative for chest pain, palpitations and leg swelling.  Gastrointestinal:  Negative for abdominal distention, abdominal pain, constipation, diarrhea, nausea and vomiting.  Musculoskeletal:  Positive for arthralgias and myalgias.  Skin: Negative.   Neurological: Negative.   Psychiatric/Behavioral: Negative.      Objective:  Physical Exam Constitutional:      Appearance: She is well-developed. She is obese.  HENT:     Head: Normocephalic and atraumatic.  Cardiovascular:     Rate and Rhythm: Normal rate and regular rhythm.  Pulmonary:     Effort: Pulmonary effort is normal. No respiratory distress.     Breath sounds: Normal breath sounds. No wheezing or rales.  Abdominal:     General: Bowel sounds are normal. There is no distension.     Palpations: Abdomen is soft.     Tenderness: There is no abdominal tenderness. There is no rebound.  Musculoskeletal:        General: Tenderness present.     Cervical back: Normal range of motion.  Skin:    General: Skin is warm and dry.  Neurological:     Mental Status: She is alert and oriented to person, place, and time.     Coordination: Coordination normal.     Vitals:   08/04/22 1309 08/04/22 1320  BP: (!) 160/100 (!) 140/100  Pulse: 66   Temp: 97.9 F (36.6 C)   TempSrc: Oral   SpO2: 97%   Weight: 206 lb (93.4 kg)   Height: 5\' 2"  (1.575 m)     Assessment & Plan:

## 2022-08-04 NOTE — Assessment & Plan Note (Signed)
Flu shot up to date. Covid-19 counseled. Pneumonia complete. Shingrix complete. Tetanus up to date. Colonoscopy up to date. Mammogram up to date, pap smear aged out and dexa unsure if done. Counseled about sun safety and mole surveillance. Counseled about the dangers of distracted driving. Given 10 year screening recommendations.

## 2022-08-04 NOTE — Assessment & Plan Note (Signed)
POC HgA1c done today at 10.8 which is still severely elevated. Not much change from prior. Continue jardiance 25 mg daily and increase amaryl to 4 mg daily and continue levemir 20 units daily. She is not having low sugars. Needs follow up in 3 months and adjust more then if needed.

## 2022-08-04 NOTE — Assessment & Plan Note (Signed)
BP elevated at visit and has not taken fluid pills yet today. Continue valsartan 80 mg daily and lasix 20 mg daily and recent labs normal. Continue at current dosing.

## 2022-08-04 NOTE — Assessment & Plan Note (Signed)
No flare today and is taking jardiance 25 mg daily lasix 20 mg daily and valsartan 80 mg daily. Recent labs so continue at current dosing.

## 2022-08-04 NOTE — Assessment & Plan Note (Signed)
Recent change to lipitor to 80 mg daily and tolerating well. Will recheck at next visit in 3 months for efficacy.

## 2022-08-04 NOTE — Assessment & Plan Note (Signed)
Stable overall and taking aspirin 81 mg daily and lipitor 80 mg daily. Not limiting activity.

## 2022-08-04 NOTE — Patient Instructions (Signed)
We will increase the dose of glimepiride to 4 mg daily and have sent in the new prescription.

## 2022-08-04 NOTE — Assessment & Plan Note (Signed)
Uses O2 2L all the time. Uses portable device which is convenient for her.

## 2022-08-09 ENCOUNTER — Other Ambulatory Visit: Payer: Self-pay | Admitting: Internal Medicine

## 2022-08-27 ENCOUNTER — Telehealth: Payer: Medicare Other

## 2022-08-30 ENCOUNTER — Other Ambulatory Visit: Payer: Self-pay | Admitting: Internal Medicine

## 2022-08-30 DIAGNOSIS — J9601 Acute respiratory failure with hypoxia: Secondary | ICD-10-CM

## 2022-09-04 DIAGNOSIS — J449 Chronic obstructive pulmonary disease, unspecified: Secondary | ICD-10-CM | POA: Diagnosis not present

## 2022-09-04 DIAGNOSIS — J9611 Chronic respiratory failure with hypoxia: Secondary | ICD-10-CM | POA: Diagnosis not present

## 2022-09-15 ENCOUNTER — Ambulatory Visit (INDEPENDENT_AMBULATORY_CARE_PROVIDER_SITE_OTHER): Payer: 59 | Admitting: Internal Medicine

## 2022-09-15 ENCOUNTER — Encounter: Payer: Self-pay | Admitting: Internal Medicine

## 2022-09-15 VITALS — BP 140/98 | HR 71 | Temp 98.3°F | Wt 203.0 lb

## 2022-09-15 DIAGNOSIS — E1140 Type 2 diabetes mellitus with diabetic neuropathy, unspecified: Secondary | ICD-10-CM | POA: Diagnosis not present

## 2022-09-15 MED ORDER — GABAPENTIN 300 MG PO CAPS: 300.0000 mg | ORAL_CAPSULE | Freq: Every day | ORAL | 1 refills | Status: DC

## 2022-09-15 NOTE — Assessment & Plan Note (Signed)
Rx gabapentin 300 mg qhs to help with neuropathy pains. Her sugars are not well controlled and she has made changes to help with that. Due for HgA1c March 2024 or April 2024.

## 2022-09-15 NOTE — Patient Instructions (Signed)
We have sent in gabapentin to take at night time to help the nerve pain.  We will keep working on the sugars.

## 2022-09-15 NOTE — Progress Notes (Signed)
   Subjective:   Patient ID: Yvonne Todd, female    DOB: 1950-11-25, 72 y.o.   MRN: [id number]  HPI The patient is a 72 YO female coming in for new/worsening neuropathy.  Review of Systems  Constitutional: Negative.   HENT: Negative.    Eyes: Negative.   Respiratory:  Negative for cough, chest tightness and shortness of breath.   Cardiovascular:  Negative for chest pain, palpitations and leg swelling.  Gastrointestinal:  Negative for abdominal distention, abdominal pain, constipation, diarrhea, nausea and vomiting.  Musculoskeletal: Negative.   Skin: Negative.   Neurological:  Positive for numbness.  Psychiatric/Behavioral: Negative.      Objective:  Physical Exam Constitutional:      Appearance: She is well-developed.  HENT:     Head: Normocephalic and atraumatic.  Cardiovascular:     Rate and Rhythm: Normal rate and regular rhythm.  Pulmonary:     Effort: Pulmonary effort is normal. No respiratory distress.     Breath sounds: Normal breath sounds. No wheezing or rales.  Abdominal:     General: Bowel sounds are normal. There is no distension.     Palpations: Abdomen is soft.     Tenderness: There is no abdominal tenderness. There is no rebound.  Musculoskeletal:     Cervical back: Normal range of motion.  Skin:    General: Skin is warm and dry.  Neurological:     Mental Status: She is alert and oriented to person, place, and time.     Sensory: Sensory deficit present.     Coordination: Coordination normal.     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

## 2022-10-02 DIAGNOSIS — J9611 Chronic respiratory failure with hypoxia: Secondary | ICD-10-CM | POA: Diagnosis not present

## 2022-10-02 DIAGNOSIS — J449 Chronic obstructive pulmonary disease, unspecified: Secondary | ICD-10-CM | POA: Diagnosis not present

## 2022-10-05 DIAGNOSIS — J9611 Chronic respiratory failure with hypoxia: Secondary | ICD-10-CM | POA: Diagnosis not present

## 2022-10-05 DIAGNOSIS — J449 Chronic obstructive pulmonary disease, unspecified: Secondary | ICD-10-CM | POA: Diagnosis not present

## 2022-11-03 DIAGNOSIS — J9611 Chronic respiratory failure with hypoxia: Secondary | ICD-10-CM | POA: Diagnosis not present

## 2022-11-03 DIAGNOSIS — J449 Chronic obstructive pulmonary disease, unspecified: Secondary | ICD-10-CM | POA: Diagnosis not present

## 2022-11-13 ENCOUNTER — Other Ambulatory Visit: Payer: Self-pay | Admitting: Internal Medicine

## 2022-12-04 DIAGNOSIS — J9611 Chronic respiratory failure with hypoxia: Secondary | ICD-10-CM | POA: Diagnosis not present

## 2022-12-04 DIAGNOSIS — J449 Chronic obstructive pulmonary disease, unspecified: Secondary | ICD-10-CM | POA: Diagnosis not present

## 2022-12-27 ENCOUNTER — Other Ambulatory Visit: Payer: Self-pay | Admitting: Internal Medicine

## 2022-12-27 DIAGNOSIS — J9601 Acute respiratory failure with hypoxia: Secondary | ICD-10-CM

## 2023-01-03 DIAGNOSIS — J449 Chronic obstructive pulmonary disease, unspecified: Secondary | ICD-10-CM | POA: Diagnosis not present

## 2023-01-03 DIAGNOSIS — J9611 Chronic respiratory failure with hypoxia: Secondary | ICD-10-CM | POA: Diagnosis not present

## 2023-02-03 DIAGNOSIS — J9611 Chronic respiratory failure with hypoxia: Secondary | ICD-10-CM | POA: Diagnosis not present

## 2023-02-03 DIAGNOSIS — J449 Chronic obstructive pulmonary disease, unspecified: Secondary | ICD-10-CM | POA: Diagnosis not present

## 2023-02-05 ENCOUNTER — Other Ambulatory Visit: Payer: Self-pay | Admitting: Internal Medicine

## 2023-02-05 DIAGNOSIS — J4489 Other specified chronic obstructive pulmonary disease: Secondary | ICD-10-CM

## 2023-02-05 DIAGNOSIS — J9601 Acute respiratory failure with hypoxia: Secondary | ICD-10-CM

## 2023-02-10 ENCOUNTER — Encounter: Payer: Self-pay | Admitting: Internal Medicine

## 2023-02-10 ENCOUNTER — Ambulatory Visit (INDEPENDENT_AMBULATORY_CARE_PROVIDER_SITE_OTHER): Payer: 59 | Admitting: Internal Medicine

## 2023-02-10 ENCOUNTER — Other Ambulatory Visit: Payer: Self-pay | Admitting: Internal Medicine

## 2023-02-10 VITALS — BP 138/86 | HR 63 | Temp 98.0°F | Ht 62.0 in | Wt 204.0 lb

## 2023-02-10 DIAGNOSIS — Z7984 Long term (current) use of oral hypoglycemic drugs: Secondary | ICD-10-CM

## 2023-02-10 DIAGNOSIS — I5032 Chronic diastolic (congestive) heart failure: Secondary | ICD-10-CM

## 2023-02-10 DIAGNOSIS — E1140 Type 2 diabetes mellitus with diabetic neuropathy, unspecified: Secondary | ICD-10-CM | POA: Diagnosis not present

## 2023-02-10 DIAGNOSIS — I739 Peripheral vascular disease, unspecified: Secondary | ICD-10-CM

## 2023-02-10 DIAGNOSIS — Z7985 Long-term (current) use of injectable non-insulin antidiabetic drugs: Secondary | ICD-10-CM | POA: Diagnosis not present

## 2023-02-10 DIAGNOSIS — J9611 Chronic respiratory failure with hypoxia: Secondary | ICD-10-CM | POA: Diagnosis not present

## 2023-02-10 DIAGNOSIS — E118 Type 2 diabetes mellitus with unspecified complications: Secondary | ICD-10-CM | POA: Diagnosis not present

## 2023-02-10 DIAGNOSIS — I10 Essential (primary) hypertension: Secondary | ICD-10-CM

## 2023-02-10 LAB — POCT GLYCOSYLATED HEMOGLOBIN (HGB A1C): HbA1c POC (<> result, manual entry): 12.3 % (ref 4.0–5.6)

## 2023-02-10 MED ORDER — GABAPENTIN 300 MG PO CAPS: 300.0000 mg | ORAL_CAPSULE | Freq: Three times a day (TID) | ORAL | 1 refills | Status: DC

## 2023-02-10 MED ORDER — GLIMEPIRIDE 4 MG PO TABS: 4.0000 mg | ORAL_TABLET | Freq: Every day | ORAL | 3 refills | Status: DC

## 2023-02-10 MED ORDER — LANTUS SOLOSTAR 100 UNIT/ML ~~LOC~~ SOPN: 25.0000 [IU] | PEN_INJECTOR | Freq: Every day | SUBCUTANEOUS | 11 refills | Status: DC

## 2023-02-10 MED ORDER — FUROSEMIDE 20 MG PO TABS: 20.0000 mg | ORAL_TABLET | Freq: Every day | ORAL | 3 refills | Status: DC

## 2023-02-10 MED ORDER — RYBELSUS 7 MG PO TABS: 7.0000 mg | ORAL_TABLET | Freq: Every day | ORAL | 2 refills | Status: DC

## 2023-02-10 NOTE — Assessment & Plan Note (Signed)
Worsening pain in legs. POC HgA1c done and worsening control and severe exacerbation with 12.3. She is not checking sugars. Evaristo Bury is not being made anymore. Change to lantus pen and increase to 25 units daily. Keep jardiance 25 mg daily and glimepiride 4 mg daily. Add rybelsus 3 mg daily for 1 week then increase to 7 mg daily until return in 3 months. She is on statin and ARB

## 2023-02-10 NOTE — Assessment & Plan Note (Signed)
With some increase in swelling of feet recently and I suspect not a true flare but more pain and less activity. Increase lasix to 40 mg daily for 1 week then return to 20 mg daily.

## 2023-02-10 NOTE — Assessment & Plan Note (Signed)
No flare today and will continue oxygen 24/7. She is using trelegy and albuterol prn inhaler and nebs.

## 2023-02-10 NOTE — Assessment & Plan Note (Signed)
Initially elevated but recheck normal. Continue valsartan 80 mg daily and increase lasix to 40 mg daily for 1 week then resume 20 mg daily.

## 2023-02-10 NOTE — Patient Instructions (Addendum)
We have sent in gabapentin to take up to 3 times a day.   We have sent in an alternative lantus for the insulin and will increase to 25 units daily.  We have sent in rybelsus to take 1/2 pill daily for 1 week then increase to 1 pill daily.  Come back in 3 months to check the sugars.   We will get the ultrasound to check the leg blood flow.  Take the lasix to 2 pills in the morning for 1 week then decrease to 1 pill daily again.

## 2023-02-10 NOTE — Progress Notes (Signed)
   Subjective:   Patient ID: Yvonne Todd, female    DOB: 11/30/1950, 72 y.o.   MRN: [id number]  HPI The patient is a 71 YO female coming in for leg swelling and pain. Needs follow up as well. Has not been checking sugars she thinks dog took this and buried it in yard.  Review of Systems  Constitutional: Negative.   HENT: Negative.    Eyes: Negative.   Respiratory:  Negative for cough, chest tightness and shortness of breath.   Cardiovascular:  Positive for leg swelling. Negative for chest pain and palpitations.  Gastrointestinal:  Negative for abdominal distention, abdominal pain, constipation, diarrhea, nausea and vomiting.  Musculoskeletal:  Positive for arthralgias and myalgias.  Skin:  Positive for wound.  Neurological:  Positive for numbness.  Psychiatric/Behavioral: Negative.      Objective:  Physical Exam Constitutional:      Appearance: She is well-developed. She is obese.  HENT:     Head: Normocephalic and atraumatic.  Cardiovascular:     Rate and Rhythm: Normal rate and regular rhythm.  Pulmonary:     Effort: Pulmonary effort is normal. No respiratory distress.     Breath sounds: Normal breath sounds. No wheezing or rales.     Comments: Oxygen in place Abdominal:     General: Bowel sounds are normal. There is no distension.     Palpations: Abdomen is soft.     Tenderness: There is no abdominal tenderness. There is no rebound.  Musculoskeletal:        General: Tenderness present.     Cervical back: Normal range of motion.     Right lower leg: Edema present.     Left lower leg: Edema present.     Comments: 1-2+ edema to knees bilaterally, wound with scab on the top surface of left and right foot both about 3 mm circular  Skin:    General: Skin is warm and dry.  Neurological:     Mental Status: She is alert and oriented to person, place, and time.     Coordination: Coordination abnormal.     Vitals:   02/10/23 0946  BP: (!) 160/86  Pulse: 63  Temp: 98 F  (36.7 C)  TempSrc: Oral  SpO2: 94%  Weight: 204 lb (92.5 kg)  Height: 5\' 2"  (1.575 m)    Assessment & Plan:

## 2023-02-10 NOTE — Assessment & Plan Note (Signed)
Increasing due to poor control of diabetes and severe exacerbation. We will increase gabapentin from 300 mg qhs to 300 mg TID prn. New rx done today.

## 2023-02-10 NOTE — Assessment & Plan Note (Signed)
Needs [place] to check for circulation given wounds on feet and home screening with some disease needs to be clarified.

## 2023-02-11 ENCOUNTER — Other Ambulatory Visit: Payer: Self-pay | Admitting: Acute Care

## 2023-02-11 DIAGNOSIS — Z87891 Personal history of nicotine dependence: Secondary | ICD-10-CM

## 2023-02-11 DIAGNOSIS — Z122 Encounter for screening for malignant neoplasm of respiratory organs: Secondary | ICD-10-CM

## 2023-02-25 ENCOUNTER — Ambulatory Visit (HOSPITAL_COMMUNITY)
Admission: RE | Admit: 2023-02-25 | Discharge: 2023-02-25 | Disposition: A | Discharge status: Home / Self Care | Payer: 59 | Source: Ambulatory Visit | Attending: Internal Medicine | Admitting: Internal Medicine

## 2023-02-25 DIAGNOSIS — E118 Type 2 diabetes mellitus with unspecified complications: Secondary | ICD-10-CM

## 2023-02-25 DIAGNOSIS — I739 Peripheral vascular disease, unspecified: Secondary | ICD-10-CM

## 2023-02-26 ENCOUNTER — Ambulatory Visit
Admission: RE | Admit: 2023-02-26 | Discharge: 2023-02-26 | Disposition: A | Discharge status: Home / Self Care | Payer: 59 | Source: Ambulatory Visit | Attending: Internal Medicine | Admitting: Internal Medicine

## 2023-02-26 ENCOUNTER — Other Ambulatory Visit: Payer: Self-pay | Admitting: Internal Medicine

## 2023-02-26 DIAGNOSIS — I739 Peripheral vascular disease, unspecified: Secondary | ICD-10-CM

## 2023-02-26 DIAGNOSIS — Z1231 Encounter for screening mammogram for malignant neoplasm of breast: Secondary | ICD-10-CM

## 2023-02-26 LAB — VAS US LOWER EXT ART SEG MULTI (SEGMENTALS & LE RAYNAUDS)
Left ABI: 0.44
Right ABI: 0.44

## 2023-03-05 DIAGNOSIS — J9611 Chronic respiratory failure with hypoxia: Secondary | ICD-10-CM | POA: Diagnosis not present

## 2023-03-05 DIAGNOSIS — J449 Chronic obstructive pulmonary disease, unspecified: Secondary | ICD-10-CM | POA: Diagnosis not present

## 2023-03-09 ENCOUNTER — Ambulatory Visit
Admission: RE | Admit: 2023-03-09 | Discharge: 2023-03-09 | Disposition: A | Discharge status: Home / Self Care | Payer: 59 | Source: Ambulatory Visit | Attending: Internal Medicine | Admitting: Internal Medicine

## 2023-03-09 DIAGNOSIS — I700 Atherosclerosis of aorta: Secondary | ICD-10-CM | POA: Diagnosis not present

## 2023-03-09 DIAGNOSIS — Z136 Encounter for screening for cardiovascular disorders: Secondary | ICD-10-CM | POA: Diagnosis not present

## 2023-03-09 DIAGNOSIS — J439 Emphysema, unspecified: Secondary | ICD-10-CM | POA: Diagnosis not present

## 2023-03-09 DIAGNOSIS — Z87891 Personal history of nicotine dependence: Secondary | ICD-10-CM | POA: Diagnosis not present

## 2023-03-09 DIAGNOSIS — Z122 Encounter for screening for malignant neoplasm of respiratory organs: Secondary | ICD-10-CM

## 2023-03-16 ENCOUNTER — Ambulatory Visit (HOSPITAL_COMMUNITY)
Admission: RE | Admit: 2023-03-16 | Discharge: 2023-03-16 | Disposition: A | Discharge status: Home / Self Care | Payer: 59 | Source: Ambulatory Visit | Attending: Internal Medicine | Admitting: Internal Medicine

## 2023-03-16 DIAGNOSIS — I739 Peripheral vascular disease, unspecified: Secondary | ICD-10-CM | POA: Insufficient documentation

## 2023-04-05 DIAGNOSIS — J449 Chronic obstructive pulmonary disease, unspecified: Secondary | ICD-10-CM | POA: Diagnosis not present

## 2023-04-05 DIAGNOSIS — J9611 Chronic respiratory failure with hypoxia: Secondary | ICD-10-CM | POA: Diagnosis not present

## 2023-04-08 ENCOUNTER — Encounter: Payer: Self-pay | Admitting: Vascular Surgery

## 2023-04-08 ENCOUNTER — Ambulatory Visit (INDEPENDENT_AMBULATORY_CARE_PROVIDER_SITE_OTHER): Payer: 59 | Admitting: Vascular Surgery

## 2023-04-08 VITALS — BP 149/74 | HR 73 | Temp 98.2°F | Resp 20 | Ht 62.0 in | Wt 206.8 lb

## 2023-04-08 DIAGNOSIS — I739 Peripheral vascular disease, unspecified: Secondary | ICD-10-CM

## 2023-04-08 DIAGNOSIS — M7989 Other specified soft tissue disorders: Secondary | ICD-10-CM | POA: Diagnosis not present
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
## 2023-04-15 ENCOUNTER — Other Ambulatory Visit: Payer: Self-pay

## 2023-04-15 DIAGNOSIS — I739 Peripheral vascular disease, unspecified: Secondary | ICD-10-CM

## 2023-04-28 ENCOUNTER — Encounter: Payer: Self-pay | Admitting: Podiatry

## 2023-04-28 ENCOUNTER — Ambulatory Visit (INDEPENDENT_AMBULATORY_CARE_PROVIDER_SITE_OTHER): Payer: 59 | Admitting: Podiatry

## 2023-04-28 VITALS — BP 194/78

## 2023-04-28 DIAGNOSIS — B351 Tinea unguium: Secondary | ICD-10-CM

## 2023-04-28 DIAGNOSIS — M79676 Pain in unspecified toe(s): Secondary | ICD-10-CM

## 2023-04-28 DIAGNOSIS — E118 Type 2 diabetes mellitus with unspecified complications: Secondary | ICD-10-CM | POA: Diagnosis not present

## 2023-04-28 DIAGNOSIS — I739 Peripheral vascular disease, unspecified: Secondary | ICD-10-CM | POA: Diagnosis not present

## 2023-04-28 DIAGNOSIS — M7989 Other specified soft tissue disorders: Secondary | ICD-10-CM | POA: Diagnosis not present

## 2023-04-28 NOTE — Progress Notes (Signed)
This patient presents to the office with chief complaint of long thick nails and diabetic feet.  This patient  says there  is  no pain and discomfort in their feet.  This patient says there are long thick painful nails.  These nails are painful walking and wearing shoes.  Patient has no history of infection or drainage from both feet.  Patient is unable to  self treat his own nails . This patient presents  to the office today for treatment of the  long nails and a foot evaluation due to history of  diabetes.  General Appearance  Alert, conversant and in no acute stress.  Vascular  Dorsalis pedis and posterior tibial  pulses are  not palpable   due to swelling.bilaterally.  Capillary return is within normal limits  bilaterally. Temperature is within normal limits  bilaterally. Severe feet swelling.  Neurologic  Senn-Weinstein monofilament wire test within normal limits  bilaterally. Muscle power within normal limits bilaterally.  Nails Thick disfigured discolored nails with subungual debris  hallux nails bilaterally. No evidence of bacterial infection or drainage bilaterally.  Orthopedic  No limitations of motion of motion feet .  No crepitus or effusions noted.  No bony pathology or digital deformities noted.  Skin  normotropic skin with no porokeratosis noted bilaterally.  No signs of infections or ulcers noted.     Onychomycosis  Diabetes with no foot complications  IE  Debride nails x 10.  A diabetic foot exam was performed and there is evidence of any vascular pathology.   RTC 3 months.   Helane Gunther DPM

## 2023-05-06 DIAGNOSIS — J449 Chronic obstructive pulmonary disease, unspecified: Secondary | ICD-10-CM | POA: Diagnosis not present

## 2023-05-06 DIAGNOSIS — J9611 Chronic respiratory failure with hypoxia: Secondary | ICD-10-CM | POA: Diagnosis not present

## 2023-05-11 ENCOUNTER — Ambulatory Visit (INDEPENDENT_AMBULATORY_CARE_PROVIDER_SITE_OTHER): Payer: 59 | Admitting: Internal Medicine

## 2023-05-11 ENCOUNTER — Other Ambulatory Visit (HOSPITAL_COMMUNITY): Payer: Self-pay

## 2023-05-11 VITALS — BP 126/68 | HR 83 | Temp 99.0°F | Ht 62.0 in | Wt 207.0 lb

## 2023-05-11 DIAGNOSIS — Z23 Encounter for immunization: Secondary | ICD-10-CM | POA: Diagnosis not present

## 2023-05-11 DIAGNOSIS — Z7984 Long term (current) use of oral hypoglycemic drugs: Secondary | ICD-10-CM | POA: Diagnosis not present

## 2023-05-11 DIAGNOSIS — E785 Hyperlipidemia, unspecified: Secondary | ICD-10-CM

## 2023-05-11 DIAGNOSIS — E1140 Type 2 diabetes mellitus with diabetic neuropathy, unspecified: Secondary | ICD-10-CM | POA: Diagnosis not present

## 2023-05-11 DIAGNOSIS — Z794 Long term (current) use of insulin: Secondary | ICD-10-CM

## 2023-05-11 DIAGNOSIS — E1169 Type 2 diabetes mellitus with other specified complication: Secondary | ICD-10-CM | POA: Diagnosis not present

## 2023-05-11 DIAGNOSIS — E118 Type 2 diabetes mellitus with unspecified complications: Secondary | ICD-10-CM | POA: Diagnosis not present

## 2023-05-11 DIAGNOSIS — J4489 Other specified chronic obstructive pulmonary disease: Secondary | ICD-10-CM

## 2023-05-11 DIAGNOSIS — J9601 Acute respiratory failure with hypoxia: Secondary | ICD-10-CM | POA: Diagnosis not present

## 2023-05-11 LAB — POCT GLYCOSYLATED HEMOGLOBIN (HGB A1C): HbA1c POC (<> result, manual entry): 11.6 % (ref 4.0–5.6)

## 2023-05-11 MED ORDER — MELOXICAM 15 MG PO TABS
15.0000 mg | ORAL_TABLET | Freq: Every day | ORAL | 3 refills | Status: DC
  Filled 2023-05-11 – 2023-05-17 (×2): qty 90, 90d supply, fill #0
  Filled 2023-08-11: qty 90, 90d supply, fill #1
  Filled 2023-11-06: qty 90, 90d supply, fill #2
  Filled 2024-01-27: qty 90, 90d supply, fill #3

## 2023-05-11 MED ORDER — ALBUTEROL SULFATE (2.5 MG/3ML) 0.083% IN NEBU
2.5000 mg | INHALATION_SOLUTION | Freq: Three times a day (TID) | RESPIRATORY_TRACT | 2 refills | Status: DC
  Filled 2023-05-11: qty 90, 10d supply, fill #0
  Filled 2023-05-12: qty 75, 9d supply, fill #0
  Filled 2023-05-17: qty 75, 9d supply, fill #1
  Filled 2023-06-09: qty 90, 10d supply, fill #1
  Filled 2023-07-11 – 2023-08-07 (×2): qty 90, 10d supply, fill #2

## 2023-05-11 MED ORDER — COMFORT EZ PEN NEEDLES 31G X 5 MM MISC
3 refills | Status: DC
  Filled 2023-05-11: qty 100, 30d supply, fill #0
  Filled 2023-05-12: qty 100, 90d supply, fill #0
  Filled 2023-08-07: qty 100, 90d supply, fill #1
  Filled 2023-11-02: qty 100, 90d supply, fill #2
  Filled 2024-01-27: qty 100, 90d supply, fill #3

## 2023-05-11 MED ORDER — FUROSEMIDE 20 MG PO TABS
20.0000 mg | ORAL_TABLET | Freq: Every day | ORAL | 3 refills | Status: DC
  Filled 2023-05-11 – 2023-05-17 (×2): qty 180, 90d supply, fill #0

## 2023-05-11 MED ORDER — ATORVASTATIN CALCIUM 80 MG PO TABS
80.0000 mg | ORAL_TABLET | Freq: Every day | ORAL | 3 refills | Status: DC
  Filled 2023-05-11 – 2023-05-12 (×2): qty 90, 90d supply, fill #0
  Filled 2023-08-07: qty 90, 90d supply, fill #1
  Filled 2023-11-02: qty 90, 90d supply, fill #2
  Filled 2024-01-27: qty 90, 90d supply, fill #3

## 2023-05-11 MED ORDER — EMPAGLIFLOZIN 25 MG PO TABS
25.0000 mg | ORAL_TABLET | Freq: Every day | ORAL | 3 refills | Status: DC
  Filled 2023-05-11 – 2023-05-17 (×2): qty 90, 90d supply, fill #0
  Filled 2023-08-11: qty 90, 90d supply, fill #1
  Filled 2023-11-06: qty 90, 90d supply, fill #2
  Filled 2024-01-27: qty 90, 90d supply, fill #3

## 2023-05-11 MED ORDER — FLUTICASONE PROPIONATE 50 MCG/ACT NA SUSP
2.0000 | Freq: Every day | NASAL | 2 refills | Status: DC
  Filled 2023-05-11 – 2023-05-12 (×2): qty 48, 90d supply, fill #0
  Filled 2023-08-07: qty 48, 90d supply, fill #1
  Filled 2023-11-02: qty 48, 90d supply, fill #2

## 2023-05-11 MED ORDER — VALSARTAN 80 MG PO TABS
80.0000 mg | ORAL_TABLET | Freq: Every day | ORAL | 3 refills | Status: DC
  Filled 2023-05-11 – 2023-05-17 (×2): qty 90, 90d supply, fill #0
  Filled 2023-08-11: qty 90, 90d supply, fill #1
  Filled 2023-11-06: qty 90, 90d supply, fill #2
  Filled 2024-01-27: qty 90, 90d supply, fill #3

## 2023-05-11 MED ORDER — GLIMEPIRIDE 4 MG PO TABS
4.0000 mg | ORAL_TABLET | Freq: Every day | ORAL | 3 refills | Status: DC
  Filled 2023-05-11 – 2023-05-12 (×2): qty 90, 90d supply, fill #0
  Filled 2023-08-03: qty 90, 90d supply, fill #1
  Filled 2023-11-02: qty 90, 90d supply, fill #2
  Filled 2024-01-27: qty 90, 90d supply, fill #3

## 2023-05-11 MED ORDER — ALBUTEROL SULFATE HFA 108 (90 BASE) MCG/ACT IN AERS
1.0000 | INHALATION_SPRAY | RESPIRATORY_TRACT | 3 refills | Status: DC | PRN
Start: 2023-05-11 — End: 2024-04-21
  Filled 2023-05-11: qty 6.7, 25d supply, fill #0
  Filled 2023-05-17: qty 6.7, 17d supply, fill #0
  Filled 2023-08-31: qty 6.7, 17d supply, fill #1
  Filled 2023-11-02: qty 6.7, 17d supply, fill #2
  Filled 2024-01-27: qty 6.7, 17d supply, fill #3

## 2023-05-11 MED ORDER — GABAPENTIN 300 MG PO CAPS
300.0000 mg | ORAL_CAPSULE | Freq: Three times a day (TID) | ORAL | 3 refills | Status: DC
  Filled 2023-05-11 – 2023-05-12 (×2): qty 270, 90d supply, fill #0
  Filled 2023-08-07: qty 270, 90d supply, fill #1
  Filled 2023-11-02: qty 270, 90d supply, fill #2
  Filled 2024-02-01: qty 270, 90d supply, fill #3

## 2023-05-11 MED ORDER — LANTUS SOLOSTAR 100 UNIT/ML ~~LOC~~ SOPN
30.0000 [IU] | PEN_INJECTOR | Freq: Every day | SUBCUTANEOUS | 11 refills | Status: DC
  Filled 2023-05-11 – 2023-05-12 (×2): qty 15, 50d supply, fill #0
  Filled 2023-06-23: qty 15, 50d supply, fill #1
  Filled 2023-08-16: qty 15, 50d supply, fill #2
  Filled 2023-10-08: qty 15, 50d supply, fill #3
  Filled 2023-11-09: qty 3, 10d supply, fill #4
  Filled 2023-11-25: qty 15, 50d supply, fill #5
  Filled 2023-11-25: qty 9, 30d supply, fill #5
  Filled 2024-01-01 – 2024-01-02 (×4): qty 15, 50d supply, fill #6

## 2023-05-11 MED ORDER — TRELEGY ELLIPTA 100-62.5-25 MCG/ACT IN AEPB
1.0000 | INHALATION_SPRAY | Freq: Every day | RESPIRATORY_TRACT | 3 refills | Status: DC
Start: 2023-05-11 — End: 2023-08-31
  Filled 2023-05-11: qty 60, 60d supply, fill #0
  Filled 2023-05-14: qty 60, 30d supply, fill #0
  Filled 2023-06-09: qty 60, 30d supply, fill #1
  Filled 2023-07-11: qty 60, 30d supply, fill #2
  Filled 2023-08-03: qty 60, 30d supply, fill #3

## 2023-05-11 MED ORDER — MONTELUKAST SODIUM 10 MG PO TABS
10.0000 mg | ORAL_TABLET | Freq: Every day | ORAL | 3 refills | Status: DC
Start: 2023-05-11 — End: 2024-04-30
  Filled 2023-05-11 – 2023-05-17 (×2): qty 90, 90d supply, fill #0
  Filled 2023-08-11: qty 90, 90d supply, fill #1
  Filled 2023-11-06: qty 90, 90d supply, fill #2
  Filled 2024-01-27: qty 90, 90d supply, fill #3

## 2023-05-11 NOTE — Progress Notes (Unsigned)
   Subjective:   Patient ID: Yvonne Todd, female    DOB: 09/21/1950, 72 y.o.   MRN: [id number]  HPI The patient is a 72 YO female coming in for follow up diabetes. Has not been taking meds due to cost and so only taking amaryl currently. Not taking jardiance Is taking lantus 25 units. Not checking sugars. Needs a new delivery pharmacy  Review of Systems  Constitutional: Negative.   HENT: Negative.    Eyes: Negative.   Respiratory:  Negative for cough, chest tightness and shortness of breath.   Cardiovascular:  Negative for chest pain, palpitations and leg swelling.  Gastrointestinal:  Negative for abdominal distention, abdominal pain, constipation, diarrhea, nausea and vomiting.  Musculoskeletal:  Positive for arthralgias.  Skin: Negative.   Neurological: Negative.   Psychiatric/Behavioral: Negative.      Objective:  Physical Exam Constitutional:      Appearance: She is well-developed.  HENT:     Head: Normocephalic and atraumatic.  Cardiovascular:     Rate and Rhythm: Normal rate and regular rhythm.  Pulmonary:     Effort: Pulmonary effort is normal. No respiratory distress.     Breath sounds: Normal breath sounds. No wheezing or rales.  Abdominal:     General: Bowel sounds are normal. There is no distension.     Palpations: Abdomen is soft.     Tenderness: There is no abdominal tenderness. There is no rebound.  Musculoskeletal:        General: Tenderness present.     Cervical back: Normal range of motion.  Skin:    General: Skin is warm and dry.  Neurological:     Mental Status: She is alert and oriented to person, place, and time.     Coordination: Coordination normal.     Vitals:   05/11/23 1554  BP: 126/68  Pulse: 83  Temp: 99 F (37.2 C)  TempSrc: Oral  SpO2: 95%  Weight: 207 lb (93.9 kg)  Height: 5\' 2"  (1.575 m)    Assessment & Plan:  Flu shot given at visit

## 2023-05-11 NOTE — Patient Instructions (Addendum)
We will go up on the lantus to 30 units once a day to help.  If your prescription is at a pharmacy outside of [facility], please call the [facility] at [place] at [phone] to provide pharmacy name and phone number to transfer the medication

## 2023-05-12 ENCOUNTER — Other Ambulatory Visit (HOSPITAL_COMMUNITY): Payer: Self-pay

## 2023-05-13 ENCOUNTER — Other Ambulatory Visit: Payer: Self-pay

## 2023-05-13 NOTE — Assessment & Plan Note (Signed)
Overall stable to mild worsening and counseled that her poorly controlled diabetes will cause worsening with time.

## 2023-05-13 NOTE — Assessment & Plan Note (Signed)
Refilled trelegy and uses this inconsistently and albuterol as needed as well.

## 2023-05-13 NOTE — Assessment & Plan Note (Signed)
POC HgA1c done and consistent with prior >11 and uncontrolled. She is unable to afford certain medications. Will continue amaryl and increase lantus to 30 units daily. Refilled jardiance 25 mg daily to see if this is covered better. Follow up 3 months.

## 2023-05-13 NOTE — Assessment & Plan Note (Signed)
Will check with next labs and adjust atorvastatin 80 mg daily as needed.

## 2023-05-14 ENCOUNTER — Other Ambulatory Visit: Payer: Self-pay

## 2023-05-17 ENCOUNTER — Other Ambulatory Visit (HOSPITAL_COMMUNITY): Payer: Self-pay

## 2023-05-18 ENCOUNTER — Other Ambulatory Visit: Payer: Self-pay

## 2023-05-18 ENCOUNTER — Other Ambulatory Visit (HOSPITAL_COMMUNITY): Payer: Self-pay

## 2023-05-25 ENCOUNTER — Other Ambulatory Visit (HOSPITAL_COMMUNITY): Payer: Self-pay

## 2023-06-05 DIAGNOSIS — J9611 Chronic respiratory failure with hypoxia: Secondary | ICD-10-CM | POA: Diagnosis not present

## 2023-06-05 DIAGNOSIS — J449 Chronic obstructive pulmonary disease, unspecified: Secondary | ICD-10-CM | POA: Diagnosis not present

## 2023-06-09 ENCOUNTER — Other Ambulatory Visit: Payer: Self-pay

## 2023-06-22 ENCOUNTER — Ambulatory Visit (INDEPENDENT_AMBULATORY_CARE_PROVIDER_SITE_OTHER): Payer: 59 | Admitting: Internal Medicine

## 2023-06-22 ENCOUNTER — Encounter: Payer: Self-pay | Admitting: Internal Medicine

## 2023-06-22 ENCOUNTER — Other Ambulatory Visit (HOSPITAL_COMMUNITY): Payer: Self-pay

## 2023-06-22 ENCOUNTER — Ambulatory Visit (INDEPENDENT_AMBULATORY_CARE_PROVIDER_SITE_OTHER): Payer: 59

## 2023-06-22 VITALS — BP 120/68 | HR 88 | Temp 98.6°F | Ht 62.0 in | Wt 207.0 lb

## 2023-06-22 VITALS — BP 120/68 | HR 88 | Temp 98.3°F | Ht 62.0 in | Wt 207.0 lb

## 2023-06-22 DIAGNOSIS — L03119 Cellulitis of unspecified part of limb: Secondary | ICD-10-CM | POA: Diagnosis not present

## 2023-06-22 DIAGNOSIS — Z0000 Encounter for general adult medical examination without abnormal findings: Secondary | ICD-10-CM

## 2023-06-22 DIAGNOSIS — L02419 Cutaneous abscess of limb, unspecified: Secondary | ICD-10-CM

## 2023-06-22 MED ORDER — SULFAMETHOXAZOLE-TRIMETHOPRIM 800-160 MG PO TABS
1.0000 | ORAL_TABLET | Freq: Two times a day (BID) | ORAL | 0 refills | Status: AC
  Filled 2023-06-22: qty 28, 14d supply, fill #0

## 2023-06-22 NOTE — Patient Instructions (Signed)
We have sent in bactrim to take 1 pill twice a day for 2 weeks. Come back in 2 weeks so we can see how this is doing. If it is worsening before them call or return.

## 2023-06-22 NOTE — Patient Instructions (Signed)
Ms. Poteat , Thank you for taking time to come for your Medicare Wellness Visit. I appreciate your ongoing commitment to your health goals. Please review the following plan we discussed and let me know if I can assist you in the future.   Referrals/Orders/Follow-Ups/Clinician Recommendations: No  This is a list of the screening recommended for you and due dates:  Health Maintenance  Topic Date Due   DEXA scan (bone density measurement)  Never done   Yearly kidney function blood test for diabetes  05/07/2023   Yearly kidney health urinalysis for diabetes  05/07/2023   Medicare Annual Wellness Visit  06/19/2023   Complete foot exam   06/18/2023   Colon Cancer Screening  02/10/2024*   Eye exam for diabetics  07/22/2023   COVID-19 Vaccine (6 - 2023-24 season) 08/16/2023   Hemoglobin A1C  11/08/2023   Screening for Lung Cancer  03/08/2024   Mammogram  02/25/2025   DTaP/Tdap/Td vaccine (2 - Td or Tdap) 12/02/2027   Pneumonia Vaccine  Completed   Flu Shot  Completed   Hepatitis C Screening  Completed   HPV Vaccine  Aged Out   Zoster (Shingles) Vaccine  Discontinued  *Topic was postponed. The date shown is not the original due date.    Advanced directives: (Copy Requested) Please bring a copy of your health care power of attorney and living will to the office to be added to your chart at your convenience.  Next Medicare Annual Wellness Visit scheduled for next year: No

## 2023-06-22 NOTE — Progress Notes (Signed)
   Subjective:   Patient ID: Yvonne Todd, female    DOB: [birth year], 72 y.o.   MRN: [id number]  HPI The patient is a 72 YO female coming in for wounds on legs. Severe PAD and seeing vascular last visit August 2024. Has critical stenosis. MOre redness and swelling in the last few days and wounds on both legs  Review of Systems  Constitutional:  Positive for activity change, chills and fatigue. Negative for appetite change.  HENT: Negative.    Eyes: Negative.   Respiratory:  Positive for cough and shortness of breath. Negative for chest tightness.   Cardiovascular:  Positive for leg swelling. Negative for chest pain and palpitations.  Gastrointestinal:  Negative for abdominal distention, abdominal pain, constipation, diarrhea, nausea and vomiting.  Musculoskeletal:  Positive for gait problem.  Skin:  Positive for wound.  Psychiatric/Behavioral: Negative.      Objective:  Physical Exam Constitutional:      Appearance: She is well-developed.  HENT:     Head: Normocephalic and atraumatic.  Cardiovascular:     Rate and Rhythm: Normal rate and regular rhythm.  Pulmonary:     Effort: Pulmonary effort is normal. No respiratory distress.     Breath sounds: No wheezing or rales.     Comments: On oxygen Abdominal:     General: Bowel sounds are normal. There is no distension.     Palpations: Abdomen is soft.     Tenderness: There is no abdominal tenderness. There is no rebound.  Musculoskeletal:     Cervical back: Normal range of motion.  Skin:    General: Skin is warm and dry.     Comments: Wound both legs frontal with redness surrounding.   Neurological:     Mental Status: She is alert and oriented to person, place, and time.     Sensory: Sensory deficit present.     Coordination: Coordination abnormal.     Vitals:   06/22/23 1351  BP: 120/68  Pulse: 88  Temp: 98.3 F (36.8 C)  TempSrc: Oral  SpO2: 94%  Weight: 207 lb (93.9 kg)  Height: 5\' 2"  (1.575 m)    Assessment &  Plan:
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
## 2023-06-23 ENCOUNTER — Other Ambulatory Visit (HOSPITAL_COMMUNITY): Payer: Self-pay

## 2023-06-24 ENCOUNTER — Other Ambulatory Visit (HOSPITAL_COMMUNITY): Payer: Self-pay

## 2023-06-26 ENCOUNTER — Encounter: Payer: Self-pay | Admitting: Internal Medicine

## 2023-06-26 DIAGNOSIS — L02419 Cutaneous abscess of limb, unspecified: Secondary | ICD-10-CM | POA: Insufficient documentation

## 2023-06-26 NOTE — Assessment & Plan Note (Signed)
Since diabetes and severe PAD will need early treatment. Rx bactrim 2 week course and needs follow up 1-2 weeks to assess progress. She has follow  up in the next month with vascular and I suspect she will need intervention sooner rather than later for her legs.

## 2023-06-30 ENCOUNTER — Ambulatory Visit (INDEPENDENT_AMBULATORY_CARE_PROVIDER_SITE_OTHER): Payer: 59 | Admitting: Internal Medicine

## 2023-06-30 ENCOUNTER — Encounter: Payer: Self-pay | Admitting: Internal Medicine

## 2023-06-30 VITALS — BP 138/80 | HR 107 | Temp 98.2°F | Ht 62.0 in | Wt 209.0 lb

## 2023-06-30 DIAGNOSIS — I739 Peripheral vascular disease, unspecified: Secondary | ICD-10-CM | POA: Diagnosis not present

## 2023-06-30 DIAGNOSIS — E1169 Type 2 diabetes mellitus with other specified complication: Secondary | ICD-10-CM

## 2023-06-30 DIAGNOSIS — E118 Type 2 diabetes mellitus with unspecified complications: Secondary | ICD-10-CM

## 2023-06-30 DIAGNOSIS — L03119 Cellulitis of unspecified part of limb: Secondary | ICD-10-CM

## 2023-06-30 DIAGNOSIS — L02419 Cutaneous abscess of limb, unspecified: Secondary | ICD-10-CM | POA: Diagnosis not present

## 2023-06-30 DIAGNOSIS — E785 Hyperlipidemia, unspecified: Secondary | ICD-10-CM

## 2023-06-30 MED ORDER — CEPHALEXIN 500 MG PO CAPS
500.0000 mg | ORAL_CAPSULE | Freq: Two times a day (BID) | ORAL | 0 refills | Status: AC
  Filled 2023-07-02: qty 28, 14d supply, fill #0

## 2023-06-30 NOTE — Progress Notes (Unsigned)
   Subjective:   Patient ID: Yvonne Todd, female    DOB: 1951-05-05, 72 y.o.   MRN: [id number]  HPI The patient is a 72 YO female coming in for check of cellulitis with drainage legs. Started bactrim about 1 week ago and this is helping some. More drainage from the two wounds but redness surrounding is decreasing. No fevers or chills. Painful if touched.   Review of Systems  Constitutional: Negative.   HENT: Negative.    Eyes: Negative.   Respiratory:  Negative for cough, chest tightness and shortness of breath.   Cardiovascular:  Positive for leg swelling. Negative for chest pain and palpitations.  Gastrointestinal:  Negative for abdominal distention, abdominal pain, constipation, diarrhea, nausea and vomiting.  Musculoskeletal:  Positive for gait problem.  Skin:  Positive for rash and wound.  Psychiatric/Behavioral: Negative.      Objective:  Physical Exam Constitutional:      Appearance: She is well-developed. She is obese.  HENT:     Head: Normocephalic and atraumatic.  Cardiovascular:     Rate and Rhythm: Normal rate and regular rhythm.  Pulmonary:     Effort: Pulmonary effort is normal. No respiratory distress.     Breath sounds: Normal breath sounds. No wheezing or rales.     Comments: Oxygen via canula Abdominal:     General: Bowel sounds are normal. There is no distension.     Palpations: Abdomen is soft.     Tenderness: There is no abdominal tenderness. There is no rebound.  Musculoskeletal:     Cervical back: Normal range of motion.     Right lower leg: Edema present.     Left lower leg: Edema present.  Skin:    General: Skin is warm and dry.     Comments: Wound left shin about 2 cm circular with some drainage and mild surrounding erythema compared to 1 week ago, right shin with wound about 1.5 cm circular mild surrounding erythema improving compared to 1 week ago  Neurological:     Mental Status: She is alert and oriented to person, place, and time.      Coordination: Coordination abnormal.     Comments: walker     Vitals:   06/30/23 1553  BP: 138/80  Pulse: (!) 107  Temp: 98.2 F (36.8 C)  TempSrc: Oral  SpO2: 93%  Weight: 209 lb (94.8 kg)  Height: 5\' 2"  (1.575 m)    Assessment & Plan:

## 2023-06-30 NOTE — Patient Instructions (Signed)
We have sent in keflex to take 1 pill twice a day for 2 weeks. Keep taking the bactrim as well until that is gone.

## 2023-07-01 ENCOUNTER — Encounter: Payer: Self-pay | Admitting: Internal Medicine

## 2023-07-01 LAB — COMPREHENSIVE METABOLIC PANEL
ALT: 36 U/L — ABNORMAL HIGH (ref 0–35)
AST: 29 U/L (ref 0–37)
Albumin: 4.1 g/dL (ref 3.5–5.2)
Alkaline Phosphatase: 119 U/L — ABNORMAL HIGH (ref 39–117)
BUN: 23 mg/dL (ref 6–23)
CO2: 31 meq/L (ref 19–32)
Calcium: 9.6 mg/dL (ref 8.4–10.5)
Chloride: 96 meq/L (ref 96–112)
Creatinine, Ser: 1.11 mg/dL (ref 0.40–1.20)
GFR: 49.36 mL/min — ABNORMAL LOW (ref >60.00)
Glucose, Bld: 400 mg/dL — ABNORMAL HIGH (ref 70–99)
Potassium: 4.7 meq/L (ref 3.5–5.1)
Sodium: 137 meq/L (ref 135–145)
Total Bilirubin: 0.3 mg/dL (ref 0.2–1.2)
Total Protein: 7.3 g/dL (ref 6.0–8.3)

## 2023-07-01 LAB — CBC
HCT: 38.4 % (ref 36.0–46.0)
Hemoglobin: 12.6 g/dL (ref 12.0–15.0)
MCHC: 32.7 g/dL (ref 30.0–36.0)
MCV: 94.9 fL (ref 78.0–100.0)
Platelets: 253 10*3/uL (ref 150.0–400.0)
RBC: 4.04 Mil/uL (ref 3.87–5.11)
RDW: 13.5 % (ref 11.5–15.5)
WBC: 4.1 10*3/uL (ref 4.0–10.5)

## 2023-07-01 LAB — LDL CHOLESTEROL, DIRECT: Direct LDL: 74 mg/dL

## 2023-07-01 LAB — MICROALBUMIN / CREATININE URINE RATIO
Creatinine,U: 25.1 mg/dL
Microalb Creat Ratio: 2.8 mg/g (ref 0.0–30.0)
Microalb, Ur: <0.7 mg/dL (ref 0.0–1.9)

## 2023-07-01 LAB — LIPID PANEL
Cholesterol: 183 mg/dL (ref 0–200)
HDL: 33.5 mg/dL — ABNORMAL LOW (ref >39.00)
Total CHOL/HDL Ratio: 5
Triglycerides: 467 mg/dL — ABNORMAL HIGH (ref 0.0–149.0)

## 2023-07-01 NOTE — Assessment & Plan Note (Signed)
Cannot palpate pulses in ankles due to swelling but there is cap refill at the feet both feet on exam. She is taking aspirin 81 mg daily and lipitor 80 mg daily.

## 2023-07-01 NOTE — Assessment & Plan Note (Signed)
Checking lipid panel and adjust lipitor 80 mg daily as needed. Goal LDL<70

## 2023-07-01 NOTE — Assessment & Plan Note (Signed)
Improving with less redness on the legs. Will add keflex 500 mg BID (has tolerated cephalosporins previously pcn allergy rash) for 2 weeks and finish last week of bactrim. She has visit with vascular in 1-2 weeks and they can check legs and follow up here 1 month or sooner if worsening or lack of improvement.

## 2023-07-01 NOTE — Assessment & Plan Note (Signed)
Checking labs since here with lipid panel, microalbumin to creatinine ratio, CBC, CMP. Adjust as needed. She has current wounds on legs which we anticipate to be slow healing given diabetes and poor circulation both legs.

## 2023-07-02 ENCOUNTER — Other Ambulatory Visit (HOSPITAL_COMMUNITY): Payer: Self-pay

## 2023-07-02 ENCOUNTER — Other Ambulatory Visit: Payer: Self-pay

## 2023-07-06 DIAGNOSIS — J9611 Chronic respiratory failure with hypoxia: Secondary | ICD-10-CM | POA: Diagnosis not present

## 2023-07-06 DIAGNOSIS — J449 Chronic obstructive pulmonary disease, unspecified: Secondary | ICD-10-CM | POA: Diagnosis not present
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
## 2023-07-09 ENCOUNTER — Encounter: Payer: Self-pay | Admitting: Vascular Surgery

## 2023-07-09 ENCOUNTER — Ambulatory Visit (HOSPITAL_COMMUNITY)
Admission: RE | Admit: 2023-07-09 | Discharge: 2023-07-09 | Disposition: A | Discharge status: Home / Self Care | Payer: 59 | Source: Ambulatory Visit | Attending: Vascular Surgery | Admitting: Vascular Surgery

## 2023-07-09 ENCOUNTER — Ambulatory Visit: Payer: 59 | Admitting: Vascular Surgery

## 2023-07-09 VITALS — BP 130/77 | HR 73 | Temp 98.1°F | Resp 20 | Ht 62.0 in | Wt 206.0 lb

## 2023-07-09 DIAGNOSIS — M7989 Other specified soft tissue disorders: Secondary | ICD-10-CM | POA: Diagnosis not present

## 2023-07-09 DIAGNOSIS — I739 Peripheral vascular disease, unspecified: Secondary | ICD-10-CM

## 2023-07-09 LAB — VAS US ABI WITH/WO TBI
Left ABI: 0.46
Right ABI: 0.5

## 2023-07-10 ENCOUNTER — Encounter (HOSPITAL_COMMUNITY): Payer: 59

## 2023-07-10 ENCOUNTER — Ambulatory Visit: Payer: 59 | Admitting: Vascular Surgery

## 2023-07-10 DIAGNOSIS — Z4800 Encounter for change or removal of nonsurgical wound dressing: Secondary | ICD-10-CM | POA: Diagnosis not present

## 2023-07-10 DIAGNOSIS — I10 Essential (primary) hypertension: Secondary | ICD-10-CM | POA: Diagnosis not present

## 2023-07-10 DIAGNOSIS — L97811 Non-pressure chronic ulcer of other part of right lower leg limited to breakdown of skin: Secondary | ICD-10-CM | POA: Diagnosis not present

## 2023-07-10 DIAGNOSIS — Z791 Long term (current) use of non-steroidal anti-inflammatories (NSAID): Secondary | ICD-10-CM | POA: Diagnosis not present

## 2023-07-10 DIAGNOSIS — Z79899 Other long term (current) drug therapy: Secondary | ICD-10-CM | POA: Diagnosis not present

## 2023-07-10 DIAGNOSIS — Z604 Social exclusion and rejection: Secondary | ICD-10-CM | POA: Diagnosis not present

## 2023-07-10 DIAGNOSIS — Z7984 Long term (current) use of oral hypoglycemic drugs: Secondary | ICD-10-CM | POA: Diagnosis not present

## 2023-07-10 DIAGNOSIS — Z794 Long term (current) use of insulin: Secondary | ICD-10-CM | POA: Diagnosis not present

## 2023-07-10 DIAGNOSIS — L97821 Non-pressure chronic ulcer of other part of left lower leg limited to breakdown of skin: Secondary | ICD-10-CM | POA: Diagnosis not present

## 2023-07-10 DIAGNOSIS — Z556 Problems related to health literacy: Secondary | ICD-10-CM | POA: Diagnosis not present

## 2023-07-10 DIAGNOSIS — Z9981 Dependence on supplemental oxygen: Secondary | ICD-10-CM | POA: Diagnosis not present

## 2023-07-10 DIAGNOSIS — Z87891 Personal history of nicotine dependence: Secondary | ICD-10-CM | POA: Diagnosis not present

## 2023-07-10 DIAGNOSIS — M1611 Unilateral primary osteoarthritis, right hip: Secondary | ICD-10-CM | POA: Diagnosis not present

## 2023-07-10 DIAGNOSIS — J4489 Other specified chronic obstructive pulmonary disease: Secondary | ICD-10-CM | POA: Diagnosis not present

## 2023-07-10 DIAGNOSIS — E1151 Type 2 diabetes mellitus with diabetic peripheral angiopathy without gangrene: Secondary | ICD-10-CM | POA: Diagnosis not present

## 2023-07-10 DIAGNOSIS — E785 Hyperlipidemia, unspecified: Secondary | ICD-10-CM | POA: Diagnosis not present

## 2023-07-10 DIAGNOSIS — Z7951 Long term (current) use of inhaled steroids: Secondary | ICD-10-CM | POA: Diagnosis not present

## 2023-07-10 DIAGNOSIS — Z96651 Presence of right artificial knee joint: Secondary | ICD-10-CM | POA: Diagnosis not present

## 2023-07-11 ENCOUNTER — Other Ambulatory Visit (HOSPITAL_COMMUNITY): Payer: Self-pay

## 2023-07-13 ENCOUNTER — Other Ambulatory Visit: Payer: Self-pay

## 2023-07-14 ENCOUNTER — Telehealth: Payer: Self-pay

## 2023-07-14 ENCOUNTER — Telehealth: Payer: Self-pay | Admitting: Internal Medicine

## 2023-07-14 NOTE — Telephone Encounter (Signed)
Called pt regarding her questionnaire submission. She states she is still in pain and is having a hard time walking. She has not spoken to Dr. Okey Dupre to increase her Lasix, as her and Dr. Karin Lieu discussed last week. I have advised her to call their office this morning and try increasing the Lasix to see if that helps, and to continue elevation and compression. If anything worsens or does not improve, she understands she is to call us back.

## 2023-07-14 NOTE — Telephone Encounter (Signed)
Pt called wanting to let Dr. Okey Dupre to  know Dr. Karin Lieu wants Dr. Okey Dupre to increase her Lasix and also pt stated she need another round of the antibiotics because she is completely out. Please give pt a call.

## 2023-07-14 NOTE — Telephone Encounter (Signed)
Please confirm current dose of lasix. Also she is due for follow up with [place] end of November not scheduled yet. Their note did not seem to think she needed antibiotics just less swelling and possibly wound care referral. Did she want to do this?

## 2023-07-15 DIAGNOSIS — E1151 Type 2 diabetes mellitus with diabetic peripheral angiopathy without gangrene: Secondary | ICD-10-CM | POA: Diagnosis not present

## 2023-07-15 DIAGNOSIS — Z9981 Dependence on supplemental oxygen: Secondary | ICD-10-CM | POA: Diagnosis not present

## 2023-07-15 DIAGNOSIS — L97811 Non-pressure chronic ulcer of other part of right lower leg limited to breakdown of skin: Secondary | ICD-10-CM | POA: Diagnosis not present

## 2023-07-15 DIAGNOSIS — Z79899 Other long term (current) drug therapy: Secondary | ICD-10-CM | POA: Diagnosis not present

## 2023-07-15 DIAGNOSIS — Z791 Long term (current) use of non-steroidal anti-inflammatories (NSAID): Secondary | ICD-10-CM | POA: Diagnosis not present

## 2023-07-15 DIAGNOSIS — Z556 Problems related to health literacy: Secondary | ICD-10-CM | POA: Diagnosis not present

## 2023-07-15 DIAGNOSIS — L97829 Non-pressure chronic ulcer of other part of left lower leg with unspecified severity: Secondary | ICD-10-CM | POA: Diagnosis not present

## 2023-07-15 DIAGNOSIS — J4489 Other specified chronic obstructive pulmonary disease: Secondary | ICD-10-CM | POA: Diagnosis not present

## 2023-07-15 DIAGNOSIS — M1611 Unilateral primary osteoarthritis, right hip: Secondary | ICD-10-CM | POA: Diagnosis not present

## 2023-07-15 DIAGNOSIS — Z96651 Presence of right artificial knee joint: Secondary | ICD-10-CM | POA: Diagnosis not present

## 2023-07-15 DIAGNOSIS — Z87891 Personal history of nicotine dependence: Secondary | ICD-10-CM | POA: Diagnosis not present

## 2023-07-15 DIAGNOSIS — Z7984 Long term (current) use of oral hypoglycemic drugs: Secondary | ICD-10-CM | POA: Diagnosis not present

## 2023-07-15 DIAGNOSIS — E785 Hyperlipidemia, unspecified: Secondary | ICD-10-CM | POA: Diagnosis not present

## 2023-07-15 DIAGNOSIS — Z4800 Encounter for change or removal of nonsurgical wound dressing: Secondary | ICD-10-CM | POA: Diagnosis not present

## 2023-07-15 DIAGNOSIS — I10 Essential (primary) hypertension: Secondary | ICD-10-CM | POA: Diagnosis not present

## 2023-07-15 DIAGNOSIS — Z604 Social exclusion and rejection: Secondary | ICD-10-CM | POA: Diagnosis not present

## 2023-07-15 DIAGNOSIS — L97819 Non-pressure chronic ulcer of other part of right lower leg with unspecified severity: Secondary | ICD-10-CM | POA: Diagnosis not present

## 2023-07-15 DIAGNOSIS — Z794 Long term (current) use of insulin: Secondary | ICD-10-CM | POA: Diagnosis not present

## 2023-07-15 DIAGNOSIS — Z7951 Long term (current) use of inhaled steroids: Secondary | ICD-10-CM | POA: Diagnosis not present

## 2023-07-15 DIAGNOSIS — L97821 Non-pressure chronic ulcer of other part of left lower leg limited to breakdown of skin: Secondary | ICD-10-CM | POA: Diagnosis not present

## 2023-07-16 ENCOUNTER — Encounter: Payer: Self-pay | Admitting: Podiatry

## 2023-07-16 NOTE — Telephone Encounter (Signed)
She states that she already have wound care coming to see her about her leg they come in and clean it up for her and redress it but she wanted another round of antibiotic to have the infection completed cleared out and she takes 20mg  of lasix

## 2023-07-17 NOTE — Telephone Encounter (Signed)
Increase lasix to 40 mg daily and no further antibiotics were indicated at her last vascular surgery visit.

## 2023-07-17 NOTE — Telephone Encounter (Signed)
Called patient and the phone continuously rung

## 2023-07-17 NOTE — Telephone Encounter (Signed)
Relayed message to patient and there were no questions or concerns

## 2023-07-24 DIAGNOSIS — L97811 Non-pressure chronic ulcer of other part of right lower leg limited to breakdown of skin: Secondary | ICD-10-CM | POA: Diagnosis not present

## 2023-07-24 DIAGNOSIS — Z9981 Dependence on supplemental oxygen: Secondary | ICD-10-CM | POA: Diagnosis not present

## 2023-07-24 DIAGNOSIS — Z794 Long term (current) use of insulin: Secondary | ICD-10-CM | POA: Diagnosis not present

## 2023-07-24 DIAGNOSIS — Z791 Long term (current) use of non-steroidal anti-inflammatories (NSAID): Secondary | ICD-10-CM | POA: Diagnosis not present

## 2023-07-24 DIAGNOSIS — Z87891 Personal history of nicotine dependence: Secondary | ICD-10-CM | POA: Diagnosis not present

## 2023-07-24 DIAGNOSIS — E1151 Type 2 diabetes mellitus with diabetic peripheral angiopathy without gangrene: Secondary | ICD-10-CM | POA: Diagnosis not present

## 2023-07-24 DIAGNOSIS — Z604 Social exclusion and rejection: Secondary | ICD-10-CM | POA: Diagnosis not present

## 2023-07-24 DIAGNOSIS — M1611 Unilateral primary osteoarthritis, right hip: Secondary | ICD-10-CM | POA: Diagnosis not present

## 2023-07-24 DIAGNOSIS — L97821 Non-pressure chronic ulcer of other part of left lower leg limited to breakdown of skin: Secondary | ICD-10-CM | POA: Diagnosis not present

## 2023-07-24 DIAGNOSIS — J4489 Other specified chronic obstructive pulmonary disease: Secondary | ICD-10-CM | POA: Diagnosis not present

## 2023-07-24 DIAGNOSIS — E785 Hyperlipidemia, unspecified: Secondary | ICD-10-CM | POA: Diagnosis not present

## 2023-07-24 DIAGNOSIS — Z7984 Long term (current) use of oral hypoglycemic drugs: Secondary | ICD-10-CM | POA: Diagnosis not present

## 2023-07-24 DIAGNOSIS — Z7951 Long term (current) use of inhaled steroids: Secondary | ICD-10-CM | POA: Diagnosis not present

## 2023-07-24 DIAGNOSIS — Z4800 Encounter for change or removal of nonsurgical wound dressing: Secondary | ICD-10-CM | POA: Diagnosis not present

## 2023-07-24 DIAGNOSIS — Z556 Problems related to health literacy: Secondary | ICD-10-CM | POA: Diagnosis not present

## 2023-07-24 DIAGNOSIS — Z79899 Other long term (current) drug therapy: Secondary | ICD-10-CM | POA: Diagnosis not present

## 2023-07-24 DIAGNOSIS — I10 Essential (primary) hypertension: Secondary | ICD-10-CM | POA: Diagnosis not present

## 2023-07-24 DIAGNOSIS — Z96651 Presence of right artificial knee joint: Secondary | ICD-10-CM | POA: Diagnosis not present

## 2023-07-29 ENCOUNTER — Ambulatory Visit: Payer: 59 | Admitting: Podiatry

## 2023-07-29 DIAGNOSIS — M1611 Unilateral primary osteoarthritis, right hip: Secondary | ICD-10-CM | POA: Diagnosis not present

## 2023-07-29 DIAGNOSIS — Z4800 Encounter for change or removal of nonsurgical wound dressing: Secondary | ICD-10-CM | POA: Diagnosis not present

## 2023-07-29 DIAGNOSIS — E785 Hyperlipidemia, unspecified: Secondary | ICD-10-CM | POA: Diagnosis not present

## 2023-07-29 DIAGNOSIS — I10 Essential (primary) hypertension: Secondary | ICD-10-CM | POA: Diagnosis not present

## 2023-07-29 DIAGNOSIS — Z556 Problems related to health literacy: Secondary | ICD-10-CM | POA: Diagnosis not present

## 2023-07-29 DIAGNOSIS — J4489 Other specified chronic obstructive pulmonary disease: Secondary | ICD-10-CM | POA: Diagnosis not present

## 2023-07-29 DIAGNOSIS — Z79899 Other long term (current) drug therapy: Secondary | ICD-10-CM | POA: Diagnosis not present

## 2023-07-29 DIAGNOSIS — L97821 Non-pressure chronic ulcer of other part of left lower leg limited to breakdown of skin: Secondary | ICD-10-CM | POA: Diagnosis not present

## 2023-07-29 DIAGNOSIS — Z794 Long term (current) use of insulin: Secondary | ICD-10-CM | POA: Diagnosis not present

## 2023-07-29 DIAGNOSIS — Z87891 Personal history of nicotine dependence: Secondary | ICD-10-CM | POA: Diagnosis not present

## 2023-07-29 DIAGNOSIS — Z7984 Long term (current) use of oral hypoglycemic drugs: Secondary | ICD-10-CM | POA: Diagnosis not present

## 2023-07-29 DIAGNOSIS — Z9981 Dependence on supplemental oxygen: Secondary | ICD-10-CM | POA: Diagnosis not present

## 2023-07-29 DIAGNOSIS — Z791 Long term (current) use of non-steroidal anti-inflammatories (NSAID): Secondary | ICD-10-CM | POA: Diagnosis not present

## 2023-07-29 DIAGNOSIS — Z7951 Long term (current) use of inhaled steroids: Secondary | ICD-10-CM | POA: Diagnosis not present

## 2023-07-29 DIAGNOSIS — Z604 Social exclusion and rejection: Secondary | ICD-10-CM | POA: Diagnosis not present

## 2023-07-29 DIAGNOSIS — L97811 Non-pressure chronic ulcer of other part of right lower leg limited to breakdown of skin: Secondary | ICD-10-CM | POA: Diagnosis not present

## 2023-07-29 DIAGNOSIS — E1151 Type 2 diabetes mellitus with diabetic peripheral angiopathy without gangrene: Secondary | ICD-10-CM | POA: Diagnosis not present

## 2023-07-29 DIAGNOSIS — Z96651 Presence of right artificial knee joint: Secondary | ICD-10-CM | POA: Diagnosis not present

## 2023-08-03 ENCOUNTER — Other Ambulatory Visit: Payer: Self-pay | Admitting: Internal Medicine

## 2023-08-03 ENCOUNTER — Other Ambulatory Visit (HOSPITAL_COMMUNITY): Payer: Self-pay

## 2023-08-03 ENCOUNTER — Other Ambulatory Visit: Payer: Self-pay

## 2023-08-03 MED ORDER — FUROSEMIDE 20 MG PO TABS
20.0000 mg | ORAL_TABLET | Freq: Every day | ORAL | 3 refills | Status: DC
  Filled 2023-08-03: qty 180, 90d supply, fill #0
  Filled 2023-11-06: qty 180, 90d supply, fill #1
  Filled 2024-01-27: qty 180, 90d supply, fill #2
  Filled 2024-04-30: qty 180, 90d supply, fill #3

## 2023-08-05 DIAGNOSIS — E1151 Type 2 diabetes mellitus with diabetic peripheral angiopathy without gangrene: Secondary | ICD-10-CM | POA: Diagnosis not present

## 2023-08-05 DIAGNOSIS — Z87891 Personal history of nicotine dependence: Secondary | ICD-10-CM | POA: Diagnosis not present

## 2023-08-05 DIAGNOSIS — Z604 Social exclusion and rejection: Secondary | ICD-10-CM | POA: Diagnosis not present

## 2023-08-05 DIAGNOSIS — L97811 Non-pressure chronic ulcer of other part of right lower leg limited to breakdown of skin: Secondary | ICD-10-CM | POA: Diagnosis not present

## 2023-08-05 DIAGNOSIS — I10 Essential (primary) hypertension: Secondary | ICD-10-CM | POA: Diagnosis not present

## 2023-08-05 DIAGNOSIS — Z7951 Long term (current) use of inhaled steroids: Secondary | ICD-10-CM | POA: Diagnosis not present

## 2023-08-05 DIAGNOSIS — Z791 Long term (current) use of non-steroidal anti-inflammatories (NSAID): Secondary | ICD-10-CM | POA: Diagnosis not present

## 2023-08-05 DIAGNOSIS — J449 Chronic obstructive pulmonary disease, unspecified: Secondary | ICD-10-CM | POA: Diagnosis not present

## 2023-08-05 DIAGNOSIS — Z96651 Presence of right artificial knee joint: Secondary | ICD-10-CM | POA: Diagnosis not present

## 2023-08-05 DIAGNOSIS — Z556 Problems related to health literacy: Secondary | ICD-10-CM | POA: Diagnosis not present

## 2023-08-05 DIAGNOSIS — Z4800 Encounter for change or removal of nonsurgical wound dressing: Secondary | ICD-10-CM | POA: Diagnosis not present

## 2023-08-05 DIAGNOSIS — L97821 Non-pressure chronic ulcer of other part of left lower leg limited to breakdown of skin: Secondary | ICD-10-CM | POA: Diagnosis not present

## 2023-08-05 DIAGNOSIS — Z794 Long term (current) use of insulin: Secondary | ICD-10-CM | POA: Diagnosis not present

## 2023-08-05 DIAGNOSIS — M1611 Unilateral primary osteoarthritis, right hip: Secondary | ICD-10-CM | POA: Diagnosis not present

## 2023-08-05 DIAGNOSIS — J4489 Other specified chronic obstructive pulmonary disease: Secondary | ICD-10-CM | POA: Diagnosis not present

## 2023-08-05 DIAGNOSIS — E785 Hyperlipidemia, unspecified: Secondary | ICD-10-CM | POA: Diagnosis not present

## 2023-08-05 DIAGNOSIS — Z79899 Other long term (current) drug therapy: Secondary | ICD-10-CM | POA: Diagnosis not present

## 2023-08-05 DIAGNOSIS — J9611 Chronic respiratory failure with hypoxia: Secondary | ICD-10-CM | POA: Diagnosis not present

## 2023-08-05 DIAGNOSIS — Z7984 Long term (current) use of oral hypoglycemic drugs: Secondary | ICD-10-CM | POA: Diagnosis not present

## 2023-08-05 DIAGNOSIS — Z9981 Dependence on supplemental oxygen: Secondary | ICD-10-CM | POA: Diagnosis not present

## 2023-08-06 ENCOUNTER — Telehealth: Payer: Self-pay

## 2023-08-06 NOTE — Telephone Encounter (Signed)
Returned pt's call from triage voicemail-two attempts made to connect with pt, as no voicemail was set up.

## 2023-08-07 ENCOUNTER — Other Ambulatory Visit: Payer: Self-pay | Admitting: Internal Medicine

## 2023-08-07 ENCOUNTER — Other Ambulatory Visit: Payer: Self-pay

## 2023-08-07 ENCOUNTER — Other Ambulatory Visit (HOSPITAL_COMMUNITY): Payer: Self-pay

## 2023-08-07 DIAGNOSIS — J9601 Acute respiratory failure with hypoxia: Secondary | ICD-10-CM

## 2023-08-11 ENCOUNTER — Ambulatory Visit: Payer: 59 | Admitting: Physician Assistant

## 2023-08-11 ENCOUNTER — Other Ambulatory Visit: Payer: Self-pay

## 2023-08-13 ENCOUNTER — Ambulatory Visit: Payer: 59 | Admitting: Physician Assistant

## 2023-08-13 VITALS — BP 130/80 | HR 73 | Temp 98.3°F | Resp 24 | Ht 62.0 in | Wt 205.9 lb

## 2023-08-13 DIAGNOSIS — I739 Peripheral vascular disease, unspecified: Secondary | ICD-10-CM

## 2023-08-13 DIAGNOSIS — I872 Venous insufficiency (chronic) (peripheral): Secondary | ICD-10-CM | POA: Diagnosis not present

## 2023-08-13 DIAGNOSIS — I87319 Chronic venous hypertension (idiopathic) with ulcer of unspecified lower extremity: Secondary | ICD-10-CM | POA: Diagnosis not present
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
## 2023-08-14 DIAGNOSIS — Z556 Problems related to health literacy: Secondary | ICD-10-CM | POA: Diagnosis not present

## 2023-08-14 DIAGNOSIS — Z4800 Encounter for change or removal of nonsurgical wound dressing: Secondary | ICD-10-CM | POA: Diagnosis not present

## 2023-08-14 DIAGNOSIS — L97811 Non-pressure chronic ulcer of other part of right lower leg limited to breakdown of skin: Secondary | ICD-10-CM | POA: Diagnosis not present

## 2023-08-14 DIAGNOSIS — Z87891 Personal history of nicotine dependence: Secondary | ICD-10-CM | POA: Diagnosis not present

## 2023-08-14 DIAGNOSIS — Z7951 Long term (current) use of inhaled steroids: Secondary | ICD-10-CM | POA: Diagnosis not present

## 2023-08-14 DIAGNOSIS — E785 Hyperlipidemia, unspecified: Secondary | ICD-10-CM | POA: Diagnosis not present

## 2023-08-14 DIAGNOSIS — I10 Essential (primary) hypertension: Secondary | ICD-10-CM | POA: Diagnosis not present

## 2023-08-14 DIAGNOSIS — Z9981 Dependence on supplemental oxygen: Secondary | ICD-10-CM | POA: Diagnosis not present

## 2023-08-14 DIAGNOSIS — M1611 Unilateral primary osteoarthritis, right hip: Secondary | ICD-10-CM | POA: Diagnosis not present

## 2023-08-14 DIAGNOSIS — E1151 Type 2 diabetes mellitus with diabetic peripheral angiopathy without gangrene: Secondary | ICD-10-CM | POA: Diagnosis not present

## 2023-08-14 DIAGNOSIS — Z791 Long term (current) use of non-steroidal anti-inflammatories (NSAID): Secondary | ICD-10-CM | POA: Diagnosis not present

## 2023-08-14 DIAGNOSIS — J4489 Other specified chronic obstructive pulmonary disease: Secondary | ICD-10-CM | POA: Diagnosis not present

## 2023-08-14 DIAGNOSIS — Z79899 Other long term (current) drug therapy: Secondary | ICD-10-CM | POA: Diagnosis not present

## 2023-08-14 DIAGNOSIS — Z794 Long term (current) use of insulin: Secondary | ICD-10-CM | POA: Diagnosis not present

## 2023-08-14 DIAGNOSIS — L97821 Non-pressure chronic ulcer of other part of left lower leg limited to breakdown of skin: Secondary | ICD-10-CM | POA: Diagnosis not present

## 2023-08-14 DIAGNOSIS — Z96651 Presence of right artificial knee joint: Secondary | ICD-10-CM | POA: Diagnosis not present

## 2023-08-14 DIAGNOSIS — Z7984 Long term (current) use of oral hypoglycemic drugs: Secondary | ICD-10-CM | POA: Diagnosis not present

## 2023-08-14 DIAGNOSIS — Z604 Social exclusion and rejection: Secondary | ICD-10-CM | POA: Diagnosis not present

## 2023-08-17 ENCOUNTER — Other Ambulatory Visit: Payer: Self-pay

## 2023-08-18 ENCOUNTER — Ambulatory Visit: Payer: 59 | Admitting: Podiatry

## 2023-08-20 ENCOUNTER — Other Ambulatory Visit: Payer: Self-pay

## 2023-08-20 DIAGNOSIS — I739 Peripheral vascular disease, unspecified: Secondary | ICD-10-CM

## 2023-08-21 DIAGNOSIS — Z7951 Long term (current) use of inhaled steroids: Secondary | ICD-10-CM | POA: Diagnosis not present

## 2023-08-21 DIAGNOSIS — Z604 Social exclusion and rejection: Secondary | ICD-10-CM | POA: Diagnosis not present

## 2023-08-21 DIAGNOSIS — E1151 Type 2 diabetes mellitus with diabetic peripheral angiopathy without gangrene: Secondary | ICD-10-CM | POA: Diagnosis not present

## 2023-08-21 DIAGNOSIS — M1611 Unilateral primary osteoarthritis, right hip: Secondary | ICD-10-CM | POA: Diagnosis not present

## 2023-08-21 DIAGNOSIS — Z96651 Presence of right artificial knee joint: Secondary | ICD-10-CM | POA: Diagnosis not present

## 2023-08-21 DIAGNOSIS — L97821 Non-pressure chronic ulcer of other part of left lower leg limited to breakdown of skin: Secondary | ICD-10-CM | POA: Diagnosis not present

## 2023-08-21 DIAGNOSIS — Z7984 Long term (current) use of oral hypoglycemic drugs: Secondary | ICD-10-CM | POA: Diagnosis not present

## 2023-08-21 DIAGNOSIS — E785 Hyperlipidemia, unspecified: Secondary | ICD-10-CM | POA: Diagnosis not present

## 2023-08-21 DIAGNOSIS — I10 Essential (primary) hypertension: Secondary | ICD-10-CM | POA: Diagnosis not present

## 2023-08-21 DIAGNOSIS — Z556 Problems related to health literacy: Secondary | ICD-10-CM | POA: Diagnosis not present

## 2023-08-21 DIAGNOSIS — Z794 Long term (current) use of insulin: Secondary | ICD-10-CM | POA: Diagnosis not present

## 2023-08-21 DIAGNOSIS — Z4800 Encounter for change or removal of nonsurgical wound dressing: Secondary | ICD-10-CM | POA: Diagnosis not present

## 2023-08-21 DIAGNOSIS — Z87891 Personal history of nicotine dependence: Secondary | ICD-10-CM | POA: Diagnosis not present

## 2023-08-21 DIAGNOSIS — Z9981 Dependence on supplemental oxygen: Secondary | ICD-10-CM | POA: Diagnosis not present

## 2023-08-21 DIAGNOSIS — Z791 Long term (current) use of non-steroidal anti-inflammatories (NSAID): Secondary | ICD-10-CM | POA: Diagnosis not present

## 2023-08-21 DIAGNOSIS — Z79899 Other long term (current) drug therapy: Secondary | ICD-10-CM | POA: Diagnosis not present

## 2023-08-21 DIAGNOSIS — J4489 Other specified chronic obstructive pulmonary disease: Secondary | ICD-10-CM | POA: Diagnosis not present

## 2023-08-21 DIAGNOSIS — L97811 Non-pressure chronic ulcer of other part of right lower leg limited to breakdown of skin: Secondary | ICD-10-CM | POA: Diagnosis not present

## 2023-08-24 ENCOUNTER — Other Ambulatory Visit (HOSPITAL_COMMUNITY): Payer: Self-pay

## 2023-08-24 MED ORDER — ALBUTEROL SULFATE (2.5 MG/3ML) 0.083% IN NEBU
2.5000 mg | INHALATION_SOLUTION | Freq: Three times a day (TID) | RESPIRATORY_TRACT | 2 refills | Status: DC
  Filled 2023-08-24 – 2023-08-25 (×2): qty 75, 9d supply, fill #0
  Filled 2023-10-07 – 2023-12-09 (×3): qty 75, 9d supply, fill #1
  Filled 2024-02-01: qty 75, 9d supply, fill #2

## 2023-08-25 ENCOUNTER — Other Ambulatory Visit: Payer: Self-pay

## 2023-08-28 DIAGNOSIS — Z96651 Presence of right artificial knee joint: Secondary | ICD-10-CM | POA: Diagnosis not present

## 2023-08-28 DIAGNOSIS — Z79899 Other long term (current) drug therapy: Secondary | ICD-10-CM | POA: Diagnosis not present

## 2023-08-28 DIAGNOSIS — E1151 Type 2 diabetes mellitus with diabetic peripheral angiopathy without gangrene: Secondary | ICD-10-CM | POA: Diagnosis not present

## 2023-08-28 DIAGNOSIS — Z791 Long term (current) use of non-steroidal anti-inflammatories (NSAID): Secondary | ICD-10-CM | POA: Diagnosis not present

## 2023-08-28 DIAGNOSIS — Z7951 Long term (current) use of inhaled steroids: Secondary | ICD-10-CM | POA: Diagnosis not present

## 2023-08-28 DIAGNOSIS — Z87891 Personal history of nicotine dependence: Secondary | ICD-10-CM | POA: Diagnosis not present

## 2023-08-28 DIAGNOSIS — L97811 Non-pressure chronic ulcer of other part of right lower leg limited to breakdown of skin: Secondary | ICD-10-CM | POA: Diagnosis not present

## 2023-08-28 DIAGNOSIS — Z4800 Encounter for change or removal of nonsurgical wound dressing: Secondary | ICD-10-CM | POA: Diagnosis not present

## 2023-08-28 DIAGNOSIS — L97821 Non-pressure chronic ulcer of other part of left lower leg limited to breakdown of skin: Secondary | ICD-10-CM | POA: Diagnosis not present

## 2023-08-28 DIAGNOSIS — J4489 Other specified chronic obstructive pulmonary disease: Secondary | ICD-10-CM | POA: Diagnosis not present

## 2023-08-28 DIAGNOSIS — Z7984 Long term (current) use of oral hypoglycemic drugs: Secondary | ICD-10-CM | POA: Diagnosis not present

## 2023-08-28 DIAGNOSIS — Z604 Social exclusion and rejection: Secondary | ICD-10-CM | POA: Diagnosis not present

## 2023-08-28 DIAGNOSIS — I10 Essential (primary) hypertension: Secondary | ICD-10-CM | POA: Diagnosis not present

## 2023-08-28 DIAGNOSIS — M1611 Unilateral primary osteoarthritis, right hip: Secondary | ICD-10-CM | POA: Diagnosis not present

## 2023-08-28 DIAGNOSIS — E785 Hyperlipidemia, unspecified: Secondary | ICD-10-CM | POA: Diagnosis not present

## 2023-08-28 DIAGNOSIS — Z9981 Dependence on supplemental oxygen: Secondary | ICD-10-CM | POA: Diagnosis not present

## 2023-08-28 DIAGNOSIS — Z794 Long term (current) use of insulin: Secondary | ICD-10-CM | POA: Diagnosis not present

## 2023-08-28 DIAGNOSIS — Z556 Problems related to health literacy: Secondary | ICD-10-CM | POA: Diagnosis not present

## 2023-08-31 ENCOUNTER — Telehealth: Payer: Self-pay | Admitting: *Deleted

## 2023-08-31 ENCOUNTER — Other Ambulatory Visit: Payer: Self-pay | Admitting: Internal Medicine

## 2023-08-31 ENCOUNTER — Other Ambulatory Visit (HOSPITAL_BASED_OUTPATIENT_CLINIC_OR_DEPARTMENT_OTHER): Payer: Self-pay

## 2023-08-31 ENCOUNTER — Other Ambulatory Visit: Payer: Self-pay

## 2023-08-31 DIAGNOSIS — Z604 Social exclusion and rejection: Secondary | ICD-10-CM | POA: Diagnosis not present

## 2023-08-31 DIAGNOSIS — M1611 Unilateral primary osteoarthritis, right hip: Secondary | ICD-10-CM | POA: Diagnosis not present

## 2023-08-31 DIAGNOSIS — L97821 Non-pressure chronic ulcer of other part of left lower leg limited to breakdown of skin: Secondary | ICD-10-CM | POA: Diagnosis not present

## 2023-08-31 DIAGNOSIS — Z7984 Long term (current) use of oral hypoglycemic drugs: Secondary | ICD-10-CM | POA: Diagnosis not present

## 2023-08-31 DIAGNOSIS — Z96651 Presence of right artificial knee joint: Secondary | ICD-10-CM | POA: Diagnosis not present

## 2023-08-31 DIAGNOSIS — Z9981 Dependence on supplemental oxygen: Secondary | ICD-10-CM | POA: Diagnosis not present

## 2023-08-31 DIAGNOSIS — Z4800 Encounter for change or removal of nonsurgical wound dressing: Secondary | ICD-10-CM | POA: Diagnosis not present

## 2023-08-31 DIAGNOSIS — J9601 Acute respiratory failure with hypoxia: Secondary | ICD-10-CM

## 2023-08-31 DIAGNOSIS — Z79899 Other long term (current) drug therapy: Secondary | ICD-10-CM | POA: Diagnosis not present

## 2023-08-31 DIAGNOSIS — Z794 Long term (current) use of insulin: Secondary | ICD-10-CM | POA: Diagnosis not present

## 2023-08-31 DIAGNOSIS — J4489 Other specified chronic obstructive pulmonary disease: Secondary | ICD-10-CM | POA: Diagnosis not present

## 2023-08-31 DIAGNOSIS — Z87891 Personal history of nicotine dependence: Secondary | ICD-10-CM | POA: Diagnosis not present

## 2023-08-31 DIAGNOSIS — L97811 Non-pressure chronic ulcer of other part of right lower leg limited to breakdown of skin: Secondary | ICD-10-CM | POA: Diagnosis not present

## 2023-08-31 DIAGNOSIS — E785 Hyperlipidemia, unspecified: Secondary | ICD-10-CM | POA: Diagnosis not present

## 2023-08-31 DIAGNOSIS — Z7951 Long term (current) use of inhaled steroids: Secondary | ICD-10-CM | POA: Diagnosis not present

## 2023-08-31 DIAGNOSIS — Z556 Problems related to health literacy: Secondary | ICD-10-CM | POA: Diagnosis not present

## 2023-08-31 DIAGNOSIS — I10 Essential (primary) hypertension: Secondary | ICD-10-CM | POA: Diagnosis not present

## 2023-08-31 DIAGNOSIS — E1151 Type 2 diabetes mellitus with diabetic peripheral angiopathy without gangrene: Secondary | ICD-10-CM | POA: Diagnosis not present

## 2023-08-31 DIAGNOSIS — Z791 Long term (current) use of non-steroidal anti-inflammatories (NSAID): Secondary | ICD-10-CM | POA: Diagnosis not present

## 2023-08-31 MED ORDER — TRELEGY ELLIPTA 100-62.5-25 MCG/ACT IN AEPB
1.0000 | INHALATION_SPRAY | Freq: Every day | RESPIRATORY_TRACT | 3 refills | Status: DC
Start: 2023-08-31 — End: 2024-01-14
  Filled 2023-08-31: qty 60, 30d supply, fill #0
  Filled 2023-10-07: qty 60, 30d supply, fill #1
  Filled 2023-11-03: qty 60, 30d supply, fill #2
  Filled 2023-12-08: qty 60, 30d supply, fill #3

## 2023-08-31 NOTE — Telephone Encounter (Signed)
Yvonne Todd from [facility] called and states patients wounds are reoccurring she is not wearing her compression due to this. They are requesting to continue [facility] wound care once weekly to monitor and treat patients wounds. Spoke with Dalzell PA ok to continue [facility] at this time patient due for follow up 10/05/23

## 2023-09-01 ENCOUNTER — Other Ambulatory Visit (HOSPITAL_COMMUNITY): Payer: Self-pay

## 2023-09-01 ENCOUNTER — Other Ambulatory Visit: Payer: Self-pay

## 2023-09-03 ENCOUNTER — Telehealth: Payer: Self-pay

## 2023-09-03 DIAGNOSIS — L97811 Non-pressure chronic ulcer of other part of right lower leg limited to breakdown of skin: Secondary | ICD-10-CM | POA: Diagnosis not present

## 2023-09-03 DIAGNOSIS — Z87891 Personal history of nicotine dependence: Secondary | ICD-10-CM | POA: Diagnosis not present

## 2023-09-03 DIAGNOSIS — L97829 Non-pressure chronic ulcer of other part of left lower leg with unspecified severity: Secondary | ICD-10-CM | POA: Diagnosis not present

## 2023-09-03 DIAGNOSIS — Z604 Social exclusion and rejection: Secondary | ICD-10-CM | POA: Diagnosis not present

## 2023-09-03 DIAGNOSIS — E1151 Type 2 diabetes mellitus with diabetic peripheral angiopathy without gangrene: Secondary | ICD-10-CM | POA: Diagnosis not present

## 2023-09-03 DIAGNOSIS — L97821 Non-pressure chronic ulcer of other part of left lower leg limited to breakdown of skin: Secondary | ICD-10-CM | POA: Diagnosis not present

## 2023-09-03 DIAGNOSIS — E785 Hyperlipidemia, unspecified: Secondary | ICD-10-CM | POA: Diagnosis not present

## 2023-09-03 DIAGNOSIS — Z79899 Other long term (current) drug therapy: Secondary | ICD-10-CM | POA: Diagnosis not present

## 2023-09-03 DIAGNOSIS — Z9981 Dependence on supplemental oxygen: Secondary | ICD-10-CM | POA: Diagnosis not present

## 2023-09-03 DIAGNOSIS — J4489 Other specified chronic obstructive pulmonary disease: Secondary | ICD-10-CM | POA: Diagnosis not present

## 2023-09-03 DIAGNOSIS — Z791 Long term (current) use of non-steroidal anti-inflammatories (NSAID): Secondary | ICD-10-CM | POA: Diagnosis not present

## 2023-09-03 DIAGNOSIS — M1611 Unilateral primary osteoarthritis, right hip: Secondary | ICD-10-CM | POA: Diagnosis not present

## 2023-09-03 DIAGNOSIS — Z7951 Long term (current) use of inhaled steroids: Secondary | ICD-10-CM | POA: Diagnosis not present

## 2023-09-03 DIAGNOSIS — Z794 Long term (current) use of insulin: Secondary | ICD-10-CM | POA: Diagnosis not present

## 2023-09-03 DIAGNOSIS — Z7984 Long term (current) use of oral hypoglycemic drugs: Secondary | ICD-10-CM | POA: Diagnosis not present

## 2023-09-03 DIAGNOSIS — Z4800 Encounter for change or removal of nonsurgical wound dressing: Secondary | ICD-10-CM | POA: Diagnosis not present

## 2023-09-03 DIAGNOSIS — Z556 Problems related to health literacy: Secondary | ICD-10-CM | POA: Diagnosis not present

## 2023-09-03 DIAGNOSIS — I10 Essential (primary) hypertension: Secondary | ICD-10-CM | POA: Diagnosis not present

## 2023-09-03 DIAGNOSIS — Z96651 Presence of right artificial knee joint: Secondary | ICD-10-CM | POA: Diagnosis not present

## 2023-09-03 NOTE — Telephone Encounter (Signed)
 Caller: Channing Sharps, RN with [facility]  Concern: New wound on anterior leg, redness, warmth, weeping, and pain  Today was first home visit by this RN, but her handoff stated that the leg wounds were healed. Today she notes significant worsening and pt admits not cohering to POC with compressions.  Location: left leg, right leg  Treatments:  HH RN asked to change wet-to-dry dressings to xeroform with ACE for compression. Will wait for PA eval before changing orders as an Radio broadcast assistant may be recommended, which she was in total agreement for a hopefully more beneficial POC.  Resolution: Appointment scheduled for next available triage appt with PA  Next Appt: Appointment scheduled for 09/04/23 @ 1415

## 2023-09-04 ENCOUNTER — Ambulatory Visit (INDEPENDENT_AMBULATORY_CARE_PROVIDER_SITE_OTHER): Payer: 59 | Admitting: Physician Assistant

## 2023-09-04 VITALS — BP 170/72 | HR 73 | Temp 98.1°F | Resp 22 | Ht 62.0 in | Wt 203.8 lb

## 2023-09-04 DIAGNOSIS — I87319 Chronic venous hypertension (idiopathic) with ulcer of unspecified lower extremity: Secondary | ICD-10-CM
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
## 2023-09-05 DIAGNOSIS — J449 Chronic obstructive pulmonary disease, unspecified: Secondary | ICD-10-CM | POA: Diagnosis not present

## 2023-09-05 DIAGNOSIS — J9611 Chronic respiratory failure with hypoxia: Secondary | ICD-10-CM | POA: Diagnosis not present

## 2023-09-07 DIAGNOSIS — L97229 Non-pressure chronic ulcer of left calf with unspecified severity: Secondary | ICD-10-CM | POA: Diagnosis not present

## 2023-09-07 DIAGNOSIS — I87319 Chronic venous hypertension (idiopathic) with ulcer of unspecified lower extremity: Secondary | ICD-10-CM | POA: Diagnosis not present

## 2023-09-08 ENCOUNTER — Encounter: Payer: Self-pay | Admitting: Podiatry

## 2023-09-08 ENCOUNTER — Ambulatory Visit (INDEPENDENT_AMBULATORY_CARE_PROVIDER_SITE_OTHER): Payer: 59 | Admitting: Podiatry

## 2023-09-08 DIAGNOSIS — M79674 Pain in right toe(s): Secondary | ICD-10-CM | POA: Diagnosis not present

## 2023-09-08 DIAGNOSIS — I739 Peripheral vascular disease, unspecified: Secondary | ICD-10-CM | POA: Diagnosis not present

## 2023-09-08 DIAGNOSIS — E118 Type 2 diabetes mellitus with unspecified complications: Secondary | ICD-10-CM | POA: Diagnosis not present

## 2023-09-08 DIAGNOSIS — B351 Tinea unguium: Secondary | ICD-10-CM | POA: Diagnosis not present

## 2023-09-08 DIAGNOSIS — M79675 Pain in left toe(s): Secondary | ICD-10-CM

## 2023-09-08 NOTE — Progress Notes (Signed)
 This patient returns to my office for at risk foot care.  This patient requires this care by a professional since this patient will be at risk due to having diabetes and pvd. This patient is unable to cut nails himself since the patient cannot reach his nails.These nails are painful walking and wearing shoes.  This patient presents for at risk foot care today.  General Appearance  Alert, conversant and in no acute stress.  Vascular  Dorsalis pedis and posterior tibial  pulses are  not palpable  due to swelling. bilaterally.  Capillary return is within normal limits  bilaterally. Temperature is within normal limits  bilaterally.  Neurologic  Senn-Weinstein monofilament wire test within normal limits  bilaterally. Muscle power within normal limits bilaterally.  Nails Thick disfigured discolored nails with subungual debris  from hallux to fifth toes bilaterally. No evidence of bacterial infection or drainage bilaterally.  Orthopedic  No limitations of motion  feet .  No crepitus or effusions noted.  No bony pathology or digital deformities noted.  Skin  normotropic skin with no porokeratosis noted bilaterally.  No signs of infections or ulcers noted.     Onychomycosis  Pain in right toes  Pain in left toes  Consent was obtained for treatment procedures.   Mechanical debridement of nails 1-5  bilaterally performed with a nail nipper.  Filed with dremel without incident.    Return office visit    3 months                  Told patient to return for periodic foot care and evaluation due to potential at risk complications.   Cordella Bold DPM

## 2023-09-09 ENCOUNTER — Telehealth: Payer: Self-pay

## 2023-09-09 DIAGNOSIS — E1151 Type 2 diabetes mellitus with diabetic peripheral angiopathy without gangrene: Secondary | ICD-10-CM | POA: Diagnosis not present

## 2023-09-09 DIAGNOSIS — L97821 Non-pressure chronic ulcer of other part of left lower leg limited to breakdown of skin: Secondary | ICD-10-CM | POA: Diagnosis not present

## 2023-09-09 DIAGNOSIS — Z87891 Personal history of nicotine dependence: Secondary | ICD-10-CM | POA: Diagnosis not present

## 2023-09-09 DIAGNOSIS — Z7951 Long term (current) use of inhaled steroids: Secondary | ICD-10-CM | POA: Diagnosis not present

## 2023-09-09 DIAGNOSIS — Z794 Long term (current) use of insulin: Secondary | ICD-10-CM | POA: Diagnosis not present

## 2023-09-09 DIAGNOSIS — Z9981 Dependence on supplemental oxygen: Secondary | ICD-10-CM | POA: Diagnosis not present

## 2023-09-09 DIAGNOSIS — E785 Hyperlipidemia, unspecified: Secondary | ICD-10-CM | POA: Diagnosis not present

## 2023-09-09 DIAGNOSIS — M1611 Unilateral primary osteoarthritis, right hip: Secondary | ICD-10-CM | POA: Diagnosis not present

## 2023-09-09 DIAGNOSIS — Z556 Problems related to health literacy: Secondary | ICD-10-CM | POA: Diagnosis not present

## 2023-09-09 DIAGNOSIS — Z79899 Other long term (current) drug therapy: Secondary | ICD-10-CM | POA: Diagnosis not present

## 2023-09-09 DIAGNOSIS — Z4800 Encounter for change or removal of nonsurgical wound dressing: Secondary | ICD-10-CM | POA: Diagnosis not present

## 2023-09-09 DIAGNOSIS — Z96651 Presence of right artificial knee joint: Secondary | ICD-10-CM | POA: Diagnosis not present

## 2023-09-09 DIAGNOSIS — J4489 Other specified chronic obstructive pulmonary disease: Secondary | ICD-10-CM | POA: Diagnosis not present

## 2023-09-09 DIAGNOSIS — Z7984 Long term (current) use of oral hypoglycemic drugs: Secondary | ICD-10-CM | POA: Diagnosis not present

## 2023-09-09 DIAGNOSIS — I10 Essential (primary) hypertension: Secondary | ICD-10-CM | POA: Diagnosis not present

## 2023-09-09 DIAGNOSIS — Z791 Long term (current) use of non-steroidal anti-inflammatories (NSAID): Secondary | ICD-10-CM | POA: Diagnosis not present

## 2023-09-09 DIAGNOSIS — Z604 Social exclusion and rejection: Secondary | ICD-10-CM | POA: Diagnosis not present

## 2023-09-09 NOTE — Telephone Encounter (Signed)
 Tina from [facility] called and requested verbal orders for patient's once weekly UNA boot change.  Reviewed pt's chart, returned call for clarification, two identifiers used.Spoke with Ellouise and gave her the verbal orders per Lucie, PA's last office visit note. Patient is to have una boot changed once weekly. Verbal orders read back with understanding.

## 2023-09-16 DIAGNOSIS — Z79899 Other long term (current) drug therapy: Secondary | ICD-10-CM | POA: Diagnosis not present

## 2023-09-16 DIAGNOSIS — Z9981 Dependence on supplemental oxygen: Secondary | ICD-10-CM | POA: Diagnosis not present

## 2023-09-16 DIAGNOSIS — E1151 Type 2 diabetes mellitus with diabetic peripheral angiopathy without gangrene: Secondary | ICD-10-CM | POA: Diagnosis not present

## 2023-09-16 DIAGNOSIS — Z794 Long term (current) use of insulin: Secondary | ICD-10-CM | POA: Diagnosis not present

## 2023-09-16 DIAGNOSIS — Z604 Social exclusion and rejection: Secondary | ICD-10-CM | POA: Diagnosis not present

## 2023-09-16 DIAGNOSIS — Z96651 Presence of right artificial knee joint: Secondary | ICD-10-CM | POA: Diagnosis not present

## 2023-09-16 DIAGNOSIS — Z556 Problems related to health literacy: Secondary | ICD-10-CM | POA: Diagnosis not present

## 2023-09-16 DIAGNOSIS — Z4800 Encounter for change or removal of nonsurgical wound dressing: Secondary | ICD-10-CM | POA: Diagnosis not present

## 2023-09-16 DIAGNOSIS — Z791 Long term (current) use of non-steroidal anti-inflammatories (NSAID): Secondary | ICD-10-CM | POA: Diagnosis not present

## 2023-09-16 DIAGNOSIS — I10 Essential (primary) hypertension: Secondary | ICD-10-CM | POA: Diagnosis not present

## 2023-09-16 DIAGNOSIS — Z87891 Personal history of nicotine dependence: Secondary | ICD-10-CM | POA: Diagnosis not present

## 2023-09-16 DIAGNOSIS — L97821 Non-pressure chronic ulcer of other part of left lower leg limited to breakdown of skin: Secondary | ICD-10-CM | POA: Diagnosis not present

## 2023-09-16 DIAGNOSIS — J4489 Other specified chronic obstructive pulmonary disease: Secondary | ICD-10-CM | POA: Diagnosis not present

## 2023-09-16 DIAGNOSIS — E785 Hyperlipidemia, unspecified: Secondary | ICD-10-CM | POA: Diagnosis not present

## 2023-09-16 DIAGNOSIS — Z7984 Long term (current) use of oral hypoglycemic drugs: Secondary | ICD-10-CM | POA: Diagnosis not present

## 2023-09-16 DIAGNOSIS — Z7951 Long term (current) use of inhaled steroids: Secondary | ICD-10-CM | POA: Diagnosis not present

## 2023-09-16 DIAGNOSIS — M1611 Unilateral primary osteoarthritis, right hip: Secondary | ICD-10-CM | POA: Diagnosis not present

## 2023-09-22 DIAGNOSIS — Z556 Problems related to health literacy: Secondary | ICD-10-CM | POA: Diagnosis not present

## 2023-09-22 DIAGNOSIS — E785 Hyperlipidemia, unspecified: Secondary | ICD-10-CM | POA: Diagnosis not present

## 2023-09-22 DIAGNOSIS — Z9981 Dependence on supplemental oxygen: Secondary | ICD-10-CM | POA: Diagnosis not present

## 2023-09-22 DIAGNOSIS — Z7984 Long term (current) use of oral hypoglycemic drugs: Secondary | ICD-10-CM | POA: Diagnosis not present

## 2023-09-22 DIAGNOSIS — I10 Essential (primary) hypertension: Secondary | ICD-10-CM | POA: Diagnosis not present

## 2023-09-22 DIAGNOSIS — M1611 Unilateral primary osteoarthritis, right hip: Secondary | ICD-10-CM | POA: Diagnosis not present

## 2023-09-22 DIAGNOSIS — Z87891 Personal history of nicotine dependence: Secondary | ICD-10-CM | POA: Diagnosis not present

## 2023-09-22 DIAGNOSIS — Z604 Social exclusion and rejection: Secondary | ICD-10-CM | POA: Diagnosis not present

## 2023-09-22 DIAGNOSIS — Z96651 Presence of right artificial knee joint: Secondary | ICD-10-CM | POA: Diagnosis not present

## 2023-09-22 DIAGNOSIS — Z794 Long term (current) use of insulin: Secondary | ICD-10-CM | POA: Diagnosis not present

## 2023-09-22 DIAGNOSIS — Z79899 Other long term (current) drug therapy: Secondary | ICD-10-CM | POA: Diagnosis not present

## 2023-09-22 DIAGNOSIS — E1151 Type 2 diabetes mellitus with diabetic peripheral angiopathy without gangrene: Secondary | ICD-10-CM | POA: Diagnosis not present

## 2023-09-22 DIAGNOSIS — Z7951 Long term (current) use of inhaled steroids: Secondary | ICD-10-CM | POA: Diagnosis not present

## 2023-09-22 DIAGNOSIS — Z791 Long term (current) use of non-steroidal anti-inflammatories (NSAID): Secondary | ICD-10-CM | POA: Diagnosis not present

## 2023-09-22 DIAGNOSIS — J4489 Other specified chronic obstructive pulmonary disease: Secondary | ICD-10-CM | POA: Diagnosis not present

## 2023-09-22 DIAGNOSIS — Z4800 Encounter for change or removal of nonsurgical wound dressing: Secondary | ICD-10-CM | POA: Diagnosis not present

## 2023-09-22 DIAGNOSIS — L97821 Non-pressure chronic ulcer of other part of left lower leg limited to breakdown of skin: Secondary | ICD-10-CM | POA: Diagnosis not present

## 2023-09-29 DIAGNOSIS — J4489 Other specified chronic obstructive pulmonary disease: Secondary | ICD-10-CM | POA: Diagnosis not present

## 2023-09-29 DIAGNOSIS — M1611 Unilateral primary osteoarthritis, right hip: Secondary | ICD-10-CM | POA: Diagnosis not present

## 2023-09-29 DIAGNOSIS — Z9981 Dependence on supplemental oxygen: Secondary | ICD-10-CM | POA: Diagnosis not present

## 2023-09-29 DIAGNOSIS — L97821 Non-pressure chronic ulcer of other part of left lower leg limited to breakdown of skin: Secondary | ICD-10-CM | POA: Diagnosis not present

## 2023-09-29 DIAGNOSIS — Z79899 Other long term (current) drug therapy: Secondary | ICD-10-CM | POA: Diagnosis not present

## 2023-09-29 DIAGNOSIS — Z4800 Encounter for change or removal of nonsurgical wound dressing: Secondary | ICD-10-CM | POA: Diagnosis not present

## 2023-09-29 DIAGNOSIS — Z87891 Personal history of nicotine dependence: Secondary | ICD-10-CM | POA: Diagnosis not present

## 2023-09-29 DIAGNOSIS — Z791 Long term (current) use of non-steroidal anti-inflammatories (NSAID): Secondary | ICD-10-CM | POA: Diagnosis not present

## 2023-09-29 DIAGNOSIS — I10 Essential (primary) hypertension: Secondary | ICD-10-CM | POA: Diagnosis not present

## 2023-09-29 DIAGNOSIS — Z96651 Presence of right artificial knee joint: Secondary | ICD-10-CM | POA: Diagnosis not present

## 2023-09-29 DIAGNOSIS — Z794 Long term (current) use of insulin: Secondary | ICD-10-CM | POA: Diagnosis not present

## 2023-09-29 DIAGNOSIS — Z7984 Long term (current) use of oral hypoglycemic drugs: Secondary | ICD-10-CM | POA: Diagnosis not present

## 2023-09-29 DIAGNOSIS — E1151 Type 2 diabetes mellitus with diabetic peripheral angiopathy without gangrene: Secondary | ICD-10-CM | POA: Diagnosis not present

## 2023-09-29 DIAGNOSIS — Z7951 Long term (current) use of inhaled steroids: Secondary | ICD-10-CM | POA: Diagnosis not present

## 2023-09-29 DIAGNOSIS — Z604 Social exclusion and rejection: Secondary | ICD-10-CM | POA: Diagnosis not present

## 2023-09-29 DIAGNOSIS — Z556 Problems related to health literacy: Secondary | ICD-10-CM | POA: Diagnosis not present

## 2023-09-29 DIAGNOSIS — E785 Hyperlipidemia, unspecified: Secondary | ICD-10-CM | POA: Diagnosis not present

## 2023-10-05 ENCOUNTER — Ambulatory Visit (HOSPITAL_COMMUNITY): Payer: 59

## 2023-10-05 ENCOUNTER — Ambulatory Visit: Payer: 59

## 2023-10-06 DIAGNOSIS — J9611 Chronic respiratory failure with hypoxia: Secondary | ICD-10-CM | POA: Diagnosis not present

## 2023-10-06 DIAGNOSIS — J449 Chronic obstructive pulmonary disease, unspecified: Secondary | ICD-10-CM | POA: Diagnosis not present

## 2023-10-07 ENCOUNTER — Other Ambulatory Visit (HOSPITAL_COMMUNITY): Payer: Self-pay

## 2023-10-08 ENCOUNTER — Other Ambulatory Visit: Payer: Self-pay

## 2023-10-08 ENCOUNTER — Encounter: Payer: Self-pay | Admitting: Pharmacist

## 2023-10-08 ENCOUNTER — Other Ambulatory Visit (HOSPITAL_COMMUNITY): Payer: Self-pay

## 2023-10-09 ENCOUNTER — Other Ambulatory Visit: Payer: Self-pay

## 2023-10-13 ENCOUNTER — Other Ambulatory Visit (HOSPITAL_COMMUNITY): Payer: Self-pay

## 2023-10-16 DIAGNOSIS — Z87891 Personal history of nicotine dependence: Secondary | ICD-10-CM | POA: Diagnosis not present

## 2023-10-16 DIAGNOSIS — J4489 Other specified chronic obstructive pulmonary disease: Secondary | ICD-10-CM | POA: Diagnosis not present

## 2023-10-16 DIAGNOSIS — I10 Essential (primary) hypertension: Secondary | ICD-10-CM | POA: Diagnosis not present

## 2023-10-16 DIAGNOSIS — Z604 Social exclusion and rejection: Secondary | ICD-10-CM | POA: Diagnosis not present

## 2023-10-16 DIAGNOSIS — Z96651 Presence of right artificial knee joint: Secondary | ICD-10-CM | POA: Diagnosis not present

## 2023-10-16 DIAGNOSIS — Z556 Problems related to health literacy: Secondary | ICD-10-CM | POA: Diagnosis not present

## 2023-10-16 DIAGNOSIS — E1151 Type 2 diabetes mellitus with diabetic peripheral angiopathy without gangrene: Secondary | ICD-10-CM | POA: Diagnosis not present

## 2023-10-16 DIAGNOSIS — Z4800 Encounter for change or removal of nonsurgical wound dressing: Secondary | ICD-10-CM | POA: Diagnosis not present

## 2023-10-16 DIAGNOSIS — Z9981 Dependence on supplemental oxygen: Secondary | ICD-10-CM | POA: Diagnosis not present

## 2023-10-16 DIAGNOSIS — Z7984 Long term (current) use of oral hypoglycemic drugs: Secondary | ICD-10-CM | POA: Diagnosis not present

## 2023-10-16 DIAGNOSIS — E785 Hyperlipidemia, unspecified: Secondary | ICD-10-CM | POA: Diagnosis not present

## 2023-10-16 DIAGNOSIS — Z791 Long term (current) use of non-steroidal anti-inflammatories (NSAID): Secondary | ICD-10-CM | POA: Diagnosis not present

## 2023-10-16 DIAGNOSIS — Z79899 Other long term (current) drug therapy: Secondary | ICD-10-CM | POA: Diagnosis not present

## 2023-10-16 DIAGNOSIS — Z794 Long term (current) use of insulin: Secondary | ICD-10-CM | POA: Diagnosis not present

## 2023-10-16 DIAGNOSIS — L97821 Non-pressure chronic ulcer of other part of left lower leg limited to breakdown of skin: Secondary | ICD-10-CM | POA: Diagnosis not present

## 2023-10-16 DIAGNOSIS — Z7951 Long term (current) use of inhaled steroids: Secondary | ICD-10-CM | POA: Diagnosis not present

## 2023-10-16 DIAGNOSIS — M1611 Unilateral primary osteoarthritis, right hip: Secondary | ICD-10-CM | POA: Diagnosis not present

## 2023-11-02 ENCOUNTER — Other Ambulatory Visit (HOSPITAL_COMMUNITY): Payer: Self-pay

## 2023-11-03 ENCOUNTER — Other Ambulatory Visit (HOSPITAL_COMMUNITY): Payer: Self-pay

## 2023-11-03 DIAGNOSIS — J449 Chronic obstructive pulmonary disease, unspecified: Secondary | ICD-10-CM | POA: Diagnosis not present

## 2023-11-03 DIAGNOSIS — J9611 Chronic respiratory failure with hypoxia: Secondary | ICD-10-CM | POA: Diagnosis not present

## 2023-11-06 ENCOUNTER — Other Ambulatory Visit (HOSPITAL_BASED_OUTPATIENT_CLINIC_OR_DEPARTMENT_OTHER): Payer: Self-pay

## 2023-11-06 ENCOUNTER — Other Ambulatory Visit (HOSPITAL_COMMUNITY): Payer: Self-pay

## 2023-11-09 ENCOUNTER — Other Ambulatory Visit: Payer: Self-pay

## 2023-11-09 ENCOUNTER — Other Ambulatory Visit (HOSPITAL_COMMUNITY): Payer: Self-pay

## 2023-11-10 ENCOUNTER — Other Ambulatory Visit: Payer: Self-pay

## 2023-11-10 ENCOUNTER — Other Ambulatory Visit (HOSPITAL_COMMUNITY): Payer: Self-pay

## 2023-11-16 ENCOUNTER — Encounter: Payer: Self-pay | Admitting: Physician Assistant

## 2023-11-16 ENCOUNTER — Ambulatory Visit (HOSPITAL_COMMUNITY)
Admission: RE | Admit: 2023-11-16 | Discharge: 2023-11-16 | Disposition: A | Discharge status: Home / Self Care | Payer: 59 | Source: Ambulatory Visit | Attending: Vascular Surgery | Admitting: Vascular Surgery

## 2023-11-16 ENCOUNTER — Ambulatory Visit (INDEPENDENT_AMBULATORY_CARE_PROVIDER_SITE_OTHER): Payer: 59 | Admitting: Physician Assistant

## 2023-11-16 VITALS — BP 135/60 | HR 56 | Temp 97.7°F | Ht 62.0 in | Wt 201.0 lb

## 2023-11-16 DIAGNOSIS — I739 Peripheral vascular disease, unspecified: Secondary | ICD-10-CM

## 2023-11-16 DIAGNOSIS — I872 Venous insufficiency (chronic) (peripheral): Secondary | ICD-10-CM

## 2023-11-16 LAB — VAS US ABI WITH/WO TBI
Left ABI: 0.53
Right ABI: 0.56
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
## 2023-11-19 ENCOUNTER — Other Ambulatory Visit: Payer: Self-pay | Admitting: *Deleted

## 2023-11-19 DIAGNOSIS — I739 Peripheral vascular disease, unspecified: Secondary | ICD-10-CM

## 2023-11-20 ENCOUNTER — Ambulatory Visit: Admitting: Internal Medicine

## 2023-11-24 ENCOUNTER — Ambulatory Visit (INDEPENDENT_AMBULATORY_CARE_PROVIDER_SITE_OTHER): Admitting: Internal Medicine

## 2023-11-24 ENCOUNTER — Other Ambulatory Visit (HOSPITAL_COMMUNITY): Payer: Self-pay

## 2023-11-24 ENCOUNTER — Encounter: Payer: Self-pay | Admitting: Internal Medicine

## 2023-11-24 VITALS — BP 158/82 | HR 88 | Temp 98.3°F | Ht 62.0 in | Wt 200.0 lb

## 2023-11-24 DIAGNOSIS — E118 Type 2 diabetes mellitus with unspecified complications: Secondary | ICD-10-CM

## 2023-11-24 DIAGNOSIS — Z7984 Long term (current) use of oral hypoglycemic drugs: Secondary | ICD-10-CM | POA: Diagnosis not present

## 2023-11-24 DIAGNOSIS — I739 Peripheral vascular disease, unspecified: Secondary | ICD-10-CM

## 2023-11-24 DIAGNOSIS — J9611 Chronic respiratory failure with hypoxia: Secondary | ICD-10-CM

## 2023-11-24 DIAGNOSIS — E1140 Type 2 diabetes mellitus with diabetic neuropathy, unspecified: Secondary | ICD-10-CM

## 2023-11-24 LAB — POCT GLYCOSYLATED HEMOGLOBIN (HGB A1C): HbA1c POC (<> result, manual entry): 10 % (ref 4.0–5.6)

## 2023-11-24 MED ORDER — DEXCOM G6 SENSOR MISC
11 refills | Status: DC
  Filled 2023-11-24: qty 3, 30d supply, fill #0

## 2023-11-24 MED ORDER — DEXCOM G6 RECEIVER DEVI
0 refills | Status: DC
  Filled 2023-11-24: qty 1, 1d supply, fill #0

## 2023-11-24 NOTE — Assessment & Plan Note (Signed)
 Using oxygen 24/7 and stable oxygen levels. She has avoiding URI this fall which is great.

## 2023-11-24 NOTE — Assessment & Plan Note (Signed)
 Stable overall and using gabapentin 300 mg TID for pain.

## 2023-11-24 NOTE — Patient Instructions (Addendum)
 We will send in the dexcom to help you monitor the sugars.   I will have you talk to the pharmacist in about 1 month to help [place] check the sugars.  Follow up 3 months with me and talk to pharmacist in between

## 2023-11-24 NOTE — Assessment & Plan Note (Signed)
 Stable and reviewed recent ABI with her moderate disease bilaterally.

## 2023-11-24 NOTE — Assessment & Plan Note (Signed)
 Ordered dexcom monitoring and suspect dose change needed for lantus 30 units at bedtime. She is not checking sugars so unsure if this is safe. Advised follow up with pharmacist to review cgm data in 30 days and referral done she prefers telephone visit. Taking amaryl 4 mg daily and jardiance 25 mg daily as well. She does not want to add another agent unless needed.

## 2023-11-24 NOTE — Progress Notes (Signed)
   Subjective:   Patient ID: Yvonne Todd, female    DOB: March 03, 1951, 73 y.o.   MRN: [id number]  HPI The patient is a 73 YO female coming in for follow up diabetes. Eating more fruit lately. Is taking insulin consistently now. Is not checking sugars does not have a meter. Wants to use cgm.   Review of Systems  Constitutional: Negative.   HENT: Negative.    Eyes: Negative.   Respiratory:  Negative for cough, chest tightness and shortness of breath.   Cardiovascular:  Negative for chest pain, palpitations and leg swelling.  Gastrointestinal:  Negative for abdominal distention, abdominal pain, constipation, diarrhea, nausea and vomiting.  Musculoskeletal: Negative.   Skin: Negative.   Neurological: Negative.   Psychiatric/Behavioral: Negative.      Objective:  Physical Exam Constitutional:      Appearance: She is well-developed.  HENT:     Head: Normocephalic and atraumatic.  Cardiovascular:     Rate and Rhythm: Normal rate and regular rhythm.  Pulmonary:     Effort: Pulmonary effort is normal. No respiratory distress.     Breath sounds: Normal breath sounds. No wheezing or rales.  Abdominal:     General: Bowel sounds are normal. There is no distension.     Palpations: Abdomen is soft.     Tenderness: There is no abdominal tenderness. There is no rebound.  Musculoskeletal:     Cervical back: Normal range of motion.  Skin:    General: Skin is warm and dry.  Neurological:     Mental Status: She is alert and oriented to person, place, and time.     Coordination: Coordination normal.     Vitals:   11/24/23 1442 11/24/23 1449  BP: (!) 158/82 (!) 158/82  Pulse: 88   Temp: 98.3 F (36.8 C)   TempSrc: Oral   SpO2: 96%   Weight: 200 lb (90.7 kg)   Height: 5\' 2"  (1.575 m)     Assessment & Plan:

## 2023-11-25 ENCOUNTER — Other Ambulatory Visit (HOSPITAL_COMMUNITY): Payer: Self-pay

## 2023-11-26 ENCOUNTER — Telehealth: Payer: Self-pay | Admitting: *Deleted

## 2023-11-26 NOTE — Progress Notes (Signed)
 Care Guide Pharmacy Note  11/26/2023 Name: Yvonne Todd MRN: [id number] DOB: 25-Jun-1951  Referred By: Myrlene Broker, MD Reason for referral: Care Coordination (Outreach to schedule referral with pharmacist )   Yvonne Todd is a 73 y.o. year old female who is a primary care patient of Myrlene Broker, MD.  Yvonne Todd was referred to the pharmacist for assistance related to: DMII  Successful contact was made with the patient to discuss pharmacy services including being ready for the pharmacist to call at least 5 minutes before the scheduled appointment time and to have medication bottles and any blood pressure readings ready for review. The patient agreed to meet with the pharmacist via telephone visit on 12/22/2023  Burman Nieves, CMA [place]  [facility], [facility] Guide Direct Dial: [phone]  Fax: [phone] Website: [email or web address]

## 2023-12-04 DIAGNOSIS — J449 Chronic obstructive pulmonary disease, unspecified: Secondary | ICD-10-CM | POA: Diagnosis not present

## 2023-12-04 DIAGNOSIS — J9611 Chronic respiratory failure with hypoxia: Secondary | ICD-10-CM | POA: Diagnosis not present

## 2023-12-08 ENCOUNTER — Ambulatory Visit: Payer: 59 | Admitting: Podiatry

## 2023-12-09 ENCOUNTER — Other Ambulatory Visit (HOSPITAL_COMMUNITY): Payer: Self-pay

## 2023-12-22 ENCOUNTER — Other Ambulatory Visit (HOSPITAL_COMMUNITY): Payer: Self-pay

## 2023-12-22 ENCOUNTER — Other Ambulatory Visit (INDEPENDENT_AMBULATORY_CARE_PROVIDER_SITE_OTHER): Admitting: Pharmacist

## 2023-12-22 ENCOUNTER — Other Ambulatory Visit: Payer: Self-pay

## 2023-12-22 DIAGNOSIS — Z794 Long term (current) use of insulin: Secondary | ICD-10-CM

## 2023-12-22 DIAGNOSIS — E118 Type 2 diabetes mellitus with unspecified complications: Secondary | ICD-10-CM

## 2023-12-22 MED ORDER — DEXCOM G7 SENSOR MISC
11 refills | Status: AC
  Filled 2023-12-22: qty 3, 30d supply, fill #0
  Filled 2024-01-19: qty 3, 30d supply, fill #1
  Filled 2024-02-19: qty 3, 30d supply, fill #2
  Filled 2024-03-22: qty 3, 30d supply, fill #3
  Filled 2024-04-15: qty 3, 30d supply, fill #4
  Filled 2024-05-11: qty 3, 30d supply, fill #5
  Filled 2024-06-14: qty 3, 30d supply, fill #6
  Filled 2024-08-05: qty 3, 30d supply, fill #7
  Filled 2024-08-26 – 2024-08-31 (×4): qty 3, 30d supply, fill #8
  Filled 2024-09-18 – 2024-09-21 (×2): qty 3, 30d supply, fill #9

## 2023-12-22 MED ORDER — DEXCOM G7 RECEIVER DEVI
0 refills | Status: DC
Start: 2023-12-22 — End: 2024-06-21
  Filled 2023-12-22: qty 1, 90d supply, fill #0

## 2023-12-22 NOTE — Patient Instructions (Signed)
 It was a pleasure speaking with you today!  Wait for Dexcom G7 to arrive in the mail from your pharmacy. Get Dexcom G7 set up and I will call on 5/2 to review readings.   If you have trouble getting it set up, please give me a call.  Feel free to call with any questions or concerns!  Rainelle Bur, PharmD, BCPS, CPP Clinical Pharmacist Practitioner [place] [phone]

## 2023-12-22 NOTE — Progress Notes (Signed)
   12/22/2023 Name: Yvonne Todd MRN: [id number] DOB: 07/02/1951  Chief Complaint  Patient presents with   Diabetes   Medication Management    Yvonne Todd is a 73 y.o. year old female who presented for a telephone visit.   They were referred to the pharmacist by their PCP for assistance in managing diabetes.    Subjective:  Care Team: Primary Care Provider: Adelia Homestead, MD ; Next Scheduled Visit: not scheduled   Medication Access/Adherence  Current Pharmacy:  [id number] - [place],  - [address] AT [facility] [address] [place] [address] Phone: [phone] Fax: [phone]  [place] [facility] [address] [place] [address] Phone: [phone] Fax: [phone]   Patient reports affordability concerns with their medications: No  Patient reports access/transportation concerns to their pharmacy: No  Patient reports adherence concerns with their medications:  No     Diabetes:  Current medications: Jardiance  25 mg daily, glimepiride  4 mg daily, Lantus  30 units daily Medications tried in the past: Rybelsus   Current glucose readings: does not check at home She received Dexcom G6 Receiver and Sensor however has not gotten it set up yet because she needed to get Wifi set up in her home. She notes her sister will be coming tomorrow to help her get the monitor set up.   Objective:  Lab Results  Component Value Date   HGBA1C 10 11/24/2023    Lab Results  Component Value Date   CREATININE 1.11 06/30/2023   BUN 23 06/30/2023   NA 137 06/30/2023   K 4.7 06/30/2023   CL 96 06/30/2023   CO2 31 06/30/2023    Lab Results  Component Value Date   CHOL 183 06/30/2023   HDL 33.50 (L) 06/30/2023   LDLCALC 111 (H) 02/18/2021   LDLDIRECT 74.0 06/30/2023   TRIG (H) 06/30/2023    467.0 Triglyceride is over 400; calculations on Lipids are invalid.   CHOLHDL 5  06/30/2023    Medications Reviewed Today   Medications were not reviewed in this encounter       Assessment/Plan:   Diabetes: - Currently uncontrolled, A1c goal <8% - Dexcom G6 transmitter was not ordered which is needed. The G6 tends to be a lot more complicated for patients due to more parts. The Dexcom G7 is also on Medicaid formulary, therefore sent G7 Sensor and Receiver to [facility] and was informed these went through her insurance no problem. They are mailing it to her. Informed pt to use G7 instead. Will f/u in 1.5 weeks to see if she has the monitor working to go over BG readings. - If she is unable to get it set up, we can schedule a OV to get Dexcom placed and set up for her    Follow Up Plan: 5/2  Rainelle Bur, PharmD, BCPS, CPP Clinical Pharmacist Practitioner [facility] [phone]

## 2024-01-01 ENCOUNTER — Other Ambulatory Visit (HOSPITAL_COMMUNITY): Payer: Self-pay

## 2024-01-01 ENCOUNTER — Other Ambulatory Visit (INDEPENDENT_AMBULATORY_CARE_PROVIDER_SITE_OTHER): Admitting: Pharmacist

## 2024-01-01 DIAGNOSIS — E118 Type 2 diabetes mellitus with unspecified complications: Secondary | ICD-10-CM

## 2024-01-01 NOTE — Patient Instructions (Signed)
 It was a pleasure speaking with you today!  Lantus  will be ready for pick up tomorrow at [facility].  I will call back 5/14 to see if you were able to get your Dexcom G7 set up.  Feel free to call with any questions or concerns!  Rainelle Bur, PharmD, BCPS, CPP Clinical Pharmacist Practitioner [place] at [facility] [phone]

## 2024-01-01 NOTE — Progress Notes (Signed)
 01/01/2024 Name: Yvonne Todd MRN: [id number] DOB: [birth year]  Chief Complaint  Patient presents with   Diabetes   Medication Management    Yvonne Todd is a 73 y.o. year old female who presented for a telephone visit.   They were referred to the pharmacist by their PCP for assistance in managing diabetes.    Subjective:  Care Team: Primary Care Provider: Adelia Homestead, MD ; Next Scheduled Visit: not scheduled   Medication Access/Adherence  Current Pharmacy:  [id number] - [place], [place] - [address] AT [facility] [address] [place] [address] Phone: [phone] Fax: [phone]  [place] [facility] [address] [place] [address] Phone: [phone] Fax: [phone]   Patient reports affordability concerns with their medications: No  Patient reports access/transportation concerns to their pharmacy: No  Patient reports adherence concerns with their medications:  No     Diabetes:  Current medications: Jardiance  25 mg daily, glimepiride  4 mg daily, Lantus  30 units daily Medications tried in the past: Rybelsus   *Has been having issues with Lantus  pens not working before full pen is used. Started her last pen today, thinks she will run out before it will be due through insurance  Current glucose readings: does not check at home She received Dexcom G7 Reciver and Sensors however she has not gotten it set up yet. She notes her sister will be coming next wek to help her get the monitor set up.   Objective:  Lab Results  Component Value Date   HGBA1C 10 11/24/2023    Lab Results  Component Value Date   CREATININE 1.11 06/30/2023   BUN 23 06/30/2023   NA 137 06/30/2023   K 4.7 06/30/2023   CL 96 06/30/2023   CO2 31 06/30/2023    Lab Results  Component Value Date   CHOL 183 06/30/2023   HDL 33.50 (L) 06/30/2023   LDLCALC 111 (H) 02/18/2021   LDLDIRECT 74.0  06/30/2023   TRIG (H) 06/30/2023    467.0 Triglyceride is over 400; calculations on Lipids are invalid.   CHOLHDL 5 06/30/2023    Medications Reviewed Today     Reviewed by Yvonne Todd, [facility] (Pharmacist) on 01/01/24 at 1623  Med List Status: <None>   Medication Order Taking? Sig Documenting Provider Last Dose Status Informant  albuterol  (PROVENTIL ) (2.5 MG/3ML) 0.083% nebulizer solution [id number] No Take 3 mLs (2.5 mg total) by nebulization 3 (three) times daily. Yvonne Homestead, MD Taking Active   albuterol  (VENTOLIN  HFA) 108 [phone] Base) MCG/ACT inhaler [id number] No Inhale 1-2 puffs into the lungs every 4 (four) hours as needed for wheezing or shortness of breath. Yvonne Homestead, MD Taking Active   Apoaequorin (PREVAGEN PO) [id number] No Take by mouth. [provider] Taking Active   aspirin  EC 81 MG tablet [id number] No Take 1 tablet (81 mg total) by mouth daily.  Patient not taking: Reported on 11/16/2023   Colin Dawley, MD Not Taking Active Self  atorvastatin  (LIPITOR) 80 MG tablet [id number] No Take 1 tablet (80 mg total) by mouth at bedtime. Yvonne Homestead, MD Taking Active   blood glucose meter kit and supplies [id number] No Dispense based on patient and insurance preference. Use up to four times daily as directed. (FOR ICD-10 E10.9, E11.9). Yvonne Homestead, MD Taking Active   Patient not taking:  Discontinued 02/21/20 1415   co-enzyme Q-10 30 MG capsule [id number] No Take  30 mg by mouth 3 (three) times daily. [provider] Taking Active   Continuous Glucose Receiver Yvonna Herder G7 [place]) DEVI [id number]  To use with G7 sensors Yvonne Homestead, MD  Active   Continuous Glucose Sensor (DEXCOM G7 [place]) MISC [id number]  Apply sensor every 10 days Yvonne Homestead, MD  Active   diphenhydrAMINE  (BENADRYL ) 25 MG tablet [id number] No Take 50 mg by mouth at bedtime. [provider] Taking Active Self   diphenhydramine -acetaminophen  (TYLENOL  PM) 25-500 MG TABS tablet [id number] No Take 2 tablets by mouth at bedtime as needed (For pain.). [provider] Taking Active Self  empagliflozin  (JARDIANCE ) 25 MG TABS tablet [id number] No Take 1 tablet (25 mg total) by mouth daily. Yvonne Homestead, MD Taking Active   fluticasone  (FLONASE ) 50 MCG/ACT nasal spray [id number] No Place 2 sprays into both nostrils daily. Yvonne Homestead, MD Taking Active   Fluticasone -Umeclidin-Vilant (TRELEGY ELLIPTA ) 100-62.5-25 MCG/ACT AEPB [id number] No Inhale 1 puff into the lungs daily. Yvonne Homestead, MD Taking Active   furosemide  (LASIX ) 20 MG tablet [id number] No Take 1-2 tablets (20-40 mg total) by mouth daily. Yvonne Homestead, MD Taking Active   gabapentin  (NEURONTIN ) 300 MG capsule [id number] No Take 1 capsule (300 mg total) by mouth 3 (three) times daily. Yvonne Homestead, MD Taking Active   glimepiride  (AMARYL ) 4 MG tablet [id number] No Take 1 tablet (4 mg total) by mouth daily with breakfast. Yvonne Homestead, MD Taking Active   insulin  glargine (LANTUS  SOLOSTAR) 100 UNIT/ML Solostar Pen [id number] No Inject 30 Units into the skin daily as directed Yvonne Homestead, MD Taking Active   Insulin  Pen Needle (COMFORT EZ PEN NEEDLES) 31G X 5 MM MISC [id number] No Use daily as directed to inject lantus  Yvonne Homestead, MD Taking Active   Patient not taking:  Discontinued 02/21/20 1415   meloxicam  (MOBIC ) 15 MG tablet [id number] No Take 1 tablet (15 mg total) by mouth daily. Yvonne Homestead, MD Taking Active   montelukast  (SINGULAIR ) 10 MG tablet [id number] No Take 1 tablet (10 mg total) by mouth at bedtime. Yvonne Homestead, MD Taking Active   Multiple Vitamin (MULTIVITAMIN) tablet [id number] No Take 1 tablet by mouth daily. [provider] Taking Active Self  Respiratory Therapy Supplies (NEBULIZER/TUBING/MOUTHPIECE) KIT [id number] No Use with nebulizer  Yvonne Homestead, MD Taking Active   valsartan  (DIOVAN ) 80 MG tablet [id number] No Take 1 tablet (80 mg total) by mouth daily. Yvonne Homestead, MD Taking Active   vitamin C (ASCORBIC ACID) 500 MG tablet [id number] No Take 500 mg by mouth daily. [provider] Taking Active               Assessment/Plan:   Diabetes: - Currently uncontrolled, A1c goal <8% - Waiting for her Dexcom to be set up so we can see how her BG are doing on her current regimen. - Sent message to [facility] regarding when her Lantus  can be filled next - it can be filled tomorrow. Pt informed and noted she'd like to pick it up tomorrow; pharmacy notified   Follow Up Plan: 5/14  Rainelle Bur, PharmD, BCPS, CPP Clinical Pharmacist Practitioner [facility] [phone]

## 2024-01-02 ENCOUNTER — Other Ambulatory Visit (HOSPITAL_COMMUNITY): Payer: Self-pay

## 2024-01-03 DIAGNOSIS — J9611 Chronic respiratory failure with hypoxia: Secondary | ICD-10-CM | POA: Diagnosis not present

## 2024-01-03 DIAGNOSIS — J449 Chronic obstructive pulmonary disease, unspecified: Secondary | ICD-10-CM | POA: Diagnosis not present

## 2024-01-11 ENCOUNTER — Ambulatory Visit: Admitting: Pharmacist

## 2024-01-11 DIAGNOSIS — Z7984 Long term (current) use of oral hypoglycemic drugs: Secondary | ICD-10-CM | POA: Diagnosis not present

## 2024-01-11 DIAGNOSIS — E118 Type 2 diabetes mellitus with unspecified complications: Secondary | ICD-10-CM | POA: Diagnosis not present

## 2024-01-11 NOTE — Patient Instructions (Signed)
 It was a pleasure speaking with you today!    Feel free to call with any questions or concerns!  Arbutus Leas, PharmD, BCPS, CPP Clinical Pharmacist Practitioner [place] [facility] [phone]

## 2024-01-11 NOTE — Progress Notes (Signed)
   01/11/2024 Name: Yvonne Todd MRN: [id number] DOB: 07/31/1951  Chief Complaint  Patient presents with   Dexcom G7 Set Up    Yvonne Todd is a 73 y.o. year old female who presented for an office visit with her sister.   They were referred to the pharmacist by their PCP for assistance in managing diabetes.    Subjective:  Care Team: Primary Care Provider: Adelia Homestead, MD ; Next Scheduled Visit: not scheduled   Medication Access/Adherence  Current Pharmacy:  [id number] - [place], [place] - [address] AT [facility] [address] [place] [address] Phone: [phone] Fax: [phone]  [place] - [facility] [address] [place] [address] Phone: [phone] Fax: [phone]   Patient reports affordability concerns with their medications: No  Patient reports access/transportation concerns to their pharmacy: No  Patient reports adherence concerns with their medications:  No     Diabetes:  Current medications: Jardiance  25 mg daily, glimepiride  4 mg daily, Lantus  30 units daily Medications tried in the past: Rybelsus   Current glucose readings: does not check at home She received Dexcom G7 Reciver and Sensors however she has not gotten it set up yet. She brought it with her to the office today to get it set up.   Objective:  Lab Results  Component Value Date   HGBA1C 10 11/24/2023    Lab Results  Component Value Date   CREATININE 1.11 06/30/2023   BUN 23 06/30/2023   NA 137 06/30/2023   K 4.7 06/30/2023   CL 96 06/30/2023   CO2 31 06/30/2023    Lab Results  Component Value Date   CHOL 183 06/30/2023   HDL 33.50 (L) 06/30/2023   LDLCALC 111 (H) 02/18/2021   LDLDIRECT 74.0 06/30/2023   TRIG (H) 06/30/2023    467.0 Triglyceride is over 400; calculations on Lipids are invalid.   CHOLHDL 5 06/30/2023    Medications Reviewed Today   Medications were not  reviewed in this encounter       Assessment/Plan:   Diabetes: - Currently uncontrolled, A1c goal <8% - Applied and set up Dexcom G7 with reader. Counseled on how to use and replace sensors. - Counseled on balanced diet and dietary modifications. Provided diet handout.   Follow Up Plan: 5/27  Rainelle Bur, PharmD, BCPS, CPP Clinical Pharmacist Practitioner [facility] [phone]

## 2024-01-13 ENCOUNTER — Ambulatory Visit: Payer: Self-pay

## 2024-01-13 ENCOUNTER — Other Ambulatory Visit

## 2024-01-13 ENCOUNTER — Other Ambulatory Visit: Payer: Self-pay

## 2024-01-13 ENCOUNTER — Emergency Department (HOSPITAL_COMMUNITY)
Admission: EM | Admit: 2024-01-13 | Discharge: 2024-01-13 | Disposition: A | Discharge status: Home / Self Care | Attending: Emergency Medicine | Admitting: Emergency Medicine

## 2024-01-13 ENCOUNTER — Encounter (HOSPITAL_COMMUNITY): Payer: Self-pay

## 2024-01-13 ENCOUNTER — Emergency Department (HOSPITAL_COMMUNITY)

## 2024-01-13 DIAGNOSIS — J4489 Other specified chronic obstructive pulmonary disease: Secondary | ICD-10-CM | POA: Insufficient documentation

## 2024-01-13 DIAGNOSIS — Z79899 Other long term (current) drug therapy: Secondary | ICD-10-CM | POA: Insufficient documentation

## 2024-01-13 DIAGNOSIS — R5383 Other fatigue: Secondary | ICD-10-CM | POA: Diagnosis not present

## 2024-01-13 DIAGNOSIS — I10 Essential (primary) hypertension: Secondary | ICD-10-CM | POA: Insufficient documentation

## 2024-01-13 DIAGNOSIS — E1165 Type 2 diabetes mellitus with hyperglycemia: Secondary | ICD-10-CM | POA: Diagnosis not present

## 2024-01-13 DIAGNOSIS — Z794 Long term (current) use of insulin: Secondary | ICD-10-CM | POA: Insufficient documentation

## 2024-01-13 DIAGNOSIS — Z7951 Long term (current) use of inhaled steroids: Secondary | ICD-10-CM | POA: Diagnosis not present

## 2024-01-13 DIAGNOSIS — R42 Dizziness and giddiness: Secondary | ICD-10-CM | POA: Insufficient documentation

## 2024-01-13 DIAGNOSIS — J9811 Atelectasis: Secondary | ICD-10-CM | POA: Diagnosis not present

## 2024-01-13 DIAGNOSIS — R0989 Other specified symptoms and signs involving the circulatory and respiratory systems: Secondary | ICD-10-CM | POA: Diagnosis not present

## 2024-01-13 DIAGNOSIS — R739 Hyperglycemia, unspecified: Secondary | ICD-10-CM

## 2024-01-13 LAB — CBC WITH DIFFERENTIAL/PLATELET
Abs Immature Granulocytes: 0.03 10*3/uL (ref 0.00–0.07)
Basophils Absolute: 0 10*3/uL (ref 0.0–0.1)
Basophils Relative: 0 %
Eosinophils Absolute: 0.2 10*3/uL (ref 0.0–0.5)
Eosinophils Relative: 3 %
HCT: 40.1 % (ref 36.0–46.0)
Hemoglobin: 13.2 g/dL (ref 12.0–15.0)
Immature Granulocytes: 0 %
Lymphocytes Relative: 25 %
Lymphs Abs: 1.7 10*3/uL (ref 0.7–4.0)
MCH: 31.8 pg (ref 26.0–34.0)
MCHC: 32.9 g/dL (ref 30.0–36.0)
MCV: 96.6 fL (ref 80.0–100.0)
Monocytes Absolute: 0.3 10*3/uL (ref 0.1–1.0)
Monocytes Relative: 5 %
Neutro Abs: 4.5 10*3/uL (ref 1.7–7.7)
Neutrophils Relative %: 67 %
Platelets: 255 10*3/uL (ref 150–400)
RBC: 4.15 MIL/uL (ref 3.87–5.11)
RDW: 13.2 % (ref 11.5–15.5)
WBC: 6.8 10*3/uL (ref 4.0–10.5)
nRBC: 0 % (ref 0.0–0.2)

## 2024-01-13 LAB — URINALYSIS, W/ REFLEX TO CULTURE (INFECTION SUSPECTED)
Bacteria, UA: NONE SEEN
Bilirubin Urine: NEGATIVE
Glucose, UA: >=500 mg/dL — AB
Hgb urine dipstick: NEGATIVE
Ketones, ur: NEGATIVE mg/dL
Nitrite: NEGATIVE
Protein, ur: NEGATIVE mg/dL
Specific Gravity, Urine: 1.023 (ref 1.005–1.030)
pH: 5 (ref 5.0–8.0)

## 2024-01-13 LAB — COMPREHENSIVE METABOLIC PANEL WITH GFR
ALT: 21 U/L (ref 0–44)
AST: 29 U/L (ref 15–41)
Albumin: 3.8 g/dL (ref 3.5–5.0)
Alkaline Phosphatase: 83 U/L (ref 38–126)
Anion gap: 11 (ref 5–15)
BUN: 19 mg/dL (ref 8–23)
CO2: 26 mmol/L (ref 22–32)
Calcium: 8.8 mg/dL — ABNORMAL LOW (ref 8.9–10.3)
Chloride: 103 mmol/L (ref 98–111)
Creatinine, Ser: 0.66 mg/dL (ref 0.44–1.00)
GFR, Estimated: >60 mL/min (ref >60)
Glucose, Bld: 293 mg/dL — ABNORMAL HIGH (ref 70–99)
Potassium: 4.2 mmol/L (ref 3.5–5.1)
Sodium: 140 mmol/L (ref 135–145)
Total Bilirubin: 1 mg/dL (ref 0.0–1.2)
Total Protein: 7.2 g/dL (ref 6.5–8.1)

## 2024-01-13 LAB — RESP PANEL BY RT-PCR (RSV, FLU A&B, COVID)  RVPGX2
Influenza A by PCR: NEGATIVE
Influenza B by PCR: NEGATIVE
Resp Syncytial Virus by PCR: NEGATIVE
SARS Coronavirus 2 by RT PCR: NEGATIVE

## 2024-01-13 LAB — VITAMIN B12: Vitamin B-12: 590 pg/mL (ref 180–914)

## 2024-01-13 LAB — CBG MONITORING, ED: Glucose-Capillary: 283 mg/dL — ABNORMAL HIGH (ref 70–99)

## 2024-01-13 LAB — BRAIN NATRIURETIC PEPTIDE: B Natriuretic Peptide: 37.4 pg/mL (ref 0.0–100.0)

## 2024-01-13 NOTE — ED Provider Triage Note (Signed)
 Emergency Medicine Provider Triage Evaluation Note  Yvonne Todd , a 73 y.o. female  was evaluated in triage.  Pt complains of multiple vague complaints.  Review of Systems  Positive:  Negative:   Physical Exam  BP (!) 174/82   Pulse 99   Temp 98.3 F (36.8 C) (Oral)   Resp 18   Ht 5\' 2"  (1.575 m)   Wt 80.7 kg   SpO2 92%   BMI 32.56 kg/m  Gen:   Awake, no distress   Resp:  Normal effort  MSK:   Moves extremities without difficulty  Other:    Medical Decision Making  Medically screening exam initiated at 12:49 PM.  Appropriate orders placed.  Steele Edelson was informed that the remainder of the evaluation will be completed by another provider, this initial triage assessment does not replace that evaluation, and the importance of remaining in the ED until their evaluation is complete.  Fatigued x2 weeks. Patient stating that the last time this happened she was given B12 - patient no longer on B12. Intermittent lack of appetite but then sometimes very hungry. Also concerned for hyperglycemia around 317 this morning.   Denies fever, chest pain, cough, nausea, vomiting, diarrhea, dysuria, hematuria.    Kendall [place], [place] 01/13/24 1252

## 2024-01-13 NOTE — ED Triage Notes (Addendum)
 Patient said her sugar this morning was 317. Feels fatigued and dizzy. Has no appetite and then is starving. Took 30 units of lantus  last night. This morning she took jardiance . Denies nausea.

## 2024-01-13 NOTE — Discharge Instructions (Signed)
 Thank you for coming to [facility] Emergency Department. You were seen for dizziness/fatigue. We did an exam, labs, and imaging, and these showed no acute findings. Please follow up with your diabetes management provider as previously scheduled in 2 weeks. Please call your primary doctor to make a follow up within 1-2 weeks if possible.   Do not hesitate to return to the ED or call 911 if you experience: -Worsening symptoms -Chest pain, shortness of breath -Abdominal pain, significant nausea/vomiting -Lightheadedness, passing out -Fevers/chills -Anything else that concerns you

## 2024-01-13 NOTE — Telephone Encounter (Signed)
  Chief Complaint: weakness, high blood sugars Symptoms: weakness, dizziness, high sugars Frequency: couple weeks Pertinent Negatives: Patient denies SOB Disposition: [x] ED /[] Urgent Care (no appt availability in office) / [] Appointment(In office/virtual)/ []  [facility]/ [] Home Care/ [] Refused Recommended Disposition /[] [facility]/ []  Follow-up with PCP Additional Notes: pt states that her avg BGM is 300-350. Pt states that she has been to weak to stand. Marvell Slider this weekend. Pt states that she is also dizzy. Pt advised to go to ED. Pt agreeable.  Copied from CRM [id number]. Topic: Clinical - Red Word Triage >> Jan 13, 2024 11:28 AM Freya Jesus wrote: Red Word that prompted transfer to Nurse Triage: High blood sugar levels and is not sure if she should go to the hospital. Feeling weak and can't even stand up and walk and hungry all the time. Reason for Disposition  Patient sounds very sick or weak to the triager  Answer Assessment - Initial Assessment Questions 1. BLOOD GLUCOSE: "What is your blood glucose level?"      344 2. ONSET: "When did you check the blood glucose?"     Couple weeks 3. USUAL RANGE: "What is your glucose level usually?" (e.g., usual fasting morning value, usual evening value)     Unsure because she does not normally check her sugar 4. KETONES: "Do you check for ketones (urine or blood test strips)?" If Yes, ask: "What does the test show now?"      denies 5. TYPE 1 or 2:  "Do you know what type of diabetes you have?"  (e.g., Type 1, Type 2, Gestational; doesn't know)      Type 2 6. INSULIN : "Do you take insulin ?" "What type of insulin (s) do you use? What is the mode of delivery? (syringe, pen; injection or pump)?"      Lantus , pen 7. DIABETES PILLS: "Do you take any pills for your diabetes?" If Yes, ask: "Have you missed taking any pills recently?"     Jardiance , taken as should 8. OTHER SYMPTOMS: "Do you have any symptoms?" (e.g., fever, frequent  urination, difficulty breathing, dizziness, weakness, vomiting)     Weakness,  Protocols used: Diabetes - High Blood Sugar-A-AH

## 2024-01-13 NOTE — ED Provider Notes (Signed)
 [place] EMERGENCY DEPARTMENT AT [facility] Provider Note   CSN: [id number] Arrival date & time: 01/13/24  1227     History  Chief Complaint  Patient presents with   Hyperglycemia    [name] is a 73 y.o. female with PMH as listed below who presents with CBG 317. Feels fatigued and dizzy. Has no appetite and then is starving. Took 30 units of lantus  last night. This morning she took jardiance . Denies nausea/vomiting, abdominal pain, f/c, cough, flu-like sxs, CP, SOB, orthopnea, N/V/D. Has had constipation but takes colace. Wears 3L [place] all the time for COPD. Endorses recent increased leg swelling x3 months but it has improved somewhat recently.     Past Medical History:  Diagnosis Date   Allergy    Iodine, Penicillin, Tetracycline.  Patient reports reaction of hives, rash, no sob, no wheezing    Arthritis    .Right knee, right hip   Asthma    COPD (chronic obstructive pulmonary disease) (HCC)    Depression    Past year depression not sleeping after fiancee deceased 23-Jan-2013   Diabetes (HCC)    Hyperlipidemia    Dx 03/2014   Hypertension    Dx 03/2014   Peripheral arterial disease (HCC)    Pneumonia    hx of recently po antibiotics   Primary localized osteoarthritis of right knee        Home Medications Prior to Admission medications   Medication Sig Start Date End Date Taking? Authorizing Provider  albuterol  (PROVENTIL ) (2.5 MG/3ML) 0.083% nebulizer solution Take 3 mLs (2.5 mg total) by nebulization 3 (three) times daily. Patient taking differently: Take 2.5 mg by nebulization 3 (three) times daily as needed for wheezing or shortness of breath (RESPIRATORY FLARES). 08/24/23  Yes Adelia Homestead, MD  albuterol  (VENTOLIN  HFA) 108 [phone] Base) MCG/ACT inhaler Inhale 1-2 puffs into the lungs every 4 (four) hours as needed for wheezing or shortness of breath. 05/11/23  Yes Adelia Homestead, MD  aspirin  EC 81 MG tablet Take 1 tablet (81 mg total) by mouth  daily. 10/17/17  Yes Emokpae, Courage, MD  atorvastatin  (LIPITOR) 80 MG tablet Take 1 tablet (80 mg total) by mouth at bedtime. 05/11/23  Yes Adelia Homestead, MD  Continuous Glucose Sensor (DEXCOM G7 SENSOR) MISC Apply sensor every 10 days 12/22/23  Yes Adelia Homestead, MD  diphenhydrAMINE  (BENADRYL ) 25 MG tablet Take 50 mg by mouth at bedtime.   Yes [provider]  diphenhydramine -acetaminophen  (TYLENOL  PM) 25-500 MG TABS tablet Take 2 tablets by mouth at bedtime as needed (for pain or sleep).   Yes [provider]  empagliflozin  (JARDIANCE ) 25 MG TABS tablet Take 1 tablet (25 mg total) by mouth daily. 05/11/23  Yes Adelia Homestead, MD  fluticasone  (FLONASE ) 50 MCG/ACT nasal spray Place 2 sprays into both nostrils daily. Patient taking differently: Place 2 sprays into both nostrils daily as needed for allergies or rhinitis. 05/11/23  Yes Adelia Homestead, MD  Fluticasone -Umeclidin-Vilant (TRELEGY ELLIPTA ) 100-62.5-25 MCG/ACT AEPB Inhale 1 puff into the lungs daily. Patient taking differently: Inhale 1 puff into the lungs at bedtime. 08/31/23  Yes Adelia Homestead, MD  furosemide  (LASIX ) 20 MG tablet Take 1-2 tablets (20-40 mg total) by mouth daily. Patient taking differently: Take 20 mg by mouth See admin instructions. Take 20 mg by mouth in the morning and at 12 NOON 08/03/23  Yes Adelia Homestead, MD  gabapentin  (NEURONTIN ) 300 MG capsule Take 1 capsule (300  mg total) by mouth 3 (three) times daily. 05/11/23  Yes Adelia Homestead, MD  insulin  glargine (LANTUS  SOLOSTAR) 100 UNIT/ML Solostar Pen Inject 30 Units into the skin daily as directed Patient taking differently: Inject 30 Units into the skin at bedtime. 05/11/23  Yes Adelia Homestead, MD  Multiple Vitamins-Minerals (CENTRUM ULTRA WOMENS) TABS Take 1 tablet by mouth daily with breakfast.   Yes [provider]  OXYGEN  Inhale 3-5 L/min into the lungs continuous.   Yes [provider]  blood glucose meter kit and supplies Dispense based on patient and insurance preference. Use up to four times daily as directed. (FOR ICD-10 E10.9, E11.9). 03/07/21   Adelia Homestead, MD  co-enzyme Q-10 30 MG capsule Take 30 mg by mouth daily.   Yes [provider]  Continuous Glucose Receiver (DEXCOM G7 RECEIVER) DEVI To use with G7 sensors 12/22/23   Adelia Homestead, MD  glimepiride  (AMARYL ) 4 MG tablet Take 1 tablet (4 mg total) by mouth daily with breakfast. 05/11/23  Yes Adelia Homestead, MD  Insulin  Pen Needle (COMFORT EZ PEN NEEDLES) 31G X 5 MM MISC Use daily as directed to inject lantus  05/11/23   Adelia Homestead, MD  meloxicam  (MOBIC ) 15 MG tablet Take 1 tablet (15 mg total) by mouth daily. 05/11/23  Yes Adelia Homestead, MD  montelukast  (SINGULAIR ) 10 MG tablet Take 1 tablet (10 mg total) by mouth at bedtime. 05/11/23  Yes Adelia Homestead, MD  Respiratory Therapy Supplies (NEBULIZER/TUBING/MOUTHPIECE) KIT Use with nebulizer 11/29/21   Adelia Homestead, MD  valsartan  (DIOVAN ) 80 MG tablet Take 1 tablet (80 mg total) by mouth daily. Patient taking differently: Take 80 mg by mouth at bedtime. 05/11/23  Yes Adelia Homestead, MD  budesonide  (PULMICORT ) 0.5 MG/2ML nebulizer solution Take 2 mLs (0.5 mg total) by nebulization 2 (two) times daily. Patient not taking: Reported on 01/17/2020 05/10/18 02/21/20  Mannam, Praveen, MD  ipratropium-albuterol  (DUONEB) 0.5-2.5 (3) MG/3ML SOLN Take 3 mLs by nebulization every 4 (four) hours as needed (shortness of breath). Patient not taking: Reported on 01/17/2020 02/06/18 02/21/20  Audria Leather, MD      Allergies    Fish allergy, Iodine, Penicillins, and Tetracyclines & related    Review of Systems   Review of Systems A 10 point review of systems was performed and is negative unless otherwise reported in HPI.  Physical Exam Updated Vital Signs BP (!) 142/62   Pulse 66   Temp 98.4 F (36.9 C)  (Oral)   Resp 20   Ht 5\' 2"  (1.575 m)   Wt 80.7 kg   SpO2 100%   BMI 32.56 kg/m  Physical Exam General: Normal appearing obese female, in a chair.  HEENT: EOMI, no nystagmus PERRLA, Sclera anicteric, MMM, trachea midline.  Cardiology: RRR, no murmurs/rubs/gallops.   Resp: Normal respiratory rate and effort. CTAB, no wheezes, rhonchi, crackles.  On 3 L nasal cannula. Abd: Soft, non-tender, non-distended. No rebound tenderness or guarding.  GU: Deferred. MSK: No peripheral edema or signs of trauma. Extremities without deformity or TTP. No cyanosis or clubbing. Skin: warm, dry. Back: No CVA tenderness Neuro: A&Ox4, CNs II-XII grossly intact. MAEs. Sensation grossly intact.  Psych: Normal mood and affect.   ED Results / Procedures / Treatments   Labs (all labs ordered are listed, but only abnormal results are displayed) Labs Reviewed  COMPREHENSIVE METABOLIC PANEL WITH GFR - Abnormal; Notable for the following components:      Result Value  Glucose, Bld 293 (*)    Calcium  8.8 (*)    All other components within normal limits  URINALYSIS, W/ REFLEX TO CULTURE (INFECTION SUSPECTED) - Abnormal; Notable for the following components:   Color, Urine STRAW (*)    Glucose, UA >=500 (*)    Leukocytes,Ua TRACE (*)    All other components within normal limits  CBG MONITORING, ED - Abnormal; Notable for the following components:   Glucose-Capillary 283 (*)    All other components within normal limits  RESP PANEL BY RT-PCR (RSV, FLU A&B, COVID)  RVPGX2  CBC WITH DIFFERENTIAL/PLATELET  VITAMIN B12  BRAIN NATRIURETIC PEPTIDE    EKG EKG Interpretation Date/Time:  Wednesday Jan 13 2024 13:00:45 EDT Ventricular Rate:  76 PR Interval:  99 QRS Duration:  94 QT Interval:  407 QTC Calculation: 458 R Axis:   73  Text Interpretation: Sinus rhythm Short PR interval Anteroseptal infarct, old Confirmed by Annita Kindle [phone]) on 01/13/2024 3:05:11 PM  Radiology DG Chest Portable 1  View Result Date: 01/13/2024 CLINICAL DATA:  Fatigue and dizziness.  Hyperglycemia. EXAM: PORTABLE CHEST 1 VIEW COMPARISON:  Radiographs 02/18/2021 and 02/02/2018. Chest CT 03/09/2023 and 03/06/2022. FINDINGS: 1546 hours. The heart size and mediastinal contours are stable with aortic atherosclerosis. There are lower lung volumes with mildly increased atelectasis at both lung bases. No confluent airspace disease, pleural effusion or pneumothorax. The aeration of the right lung base appears mildly improved compared with the most recent CT. No acute osseous findings are evident. IMPRESSION: Lower lung volumes with mildly increased atelectasis at both lung bases. No definite acute cardiopulmonary process. Please refer to chest CT report 03/09/2023 for follow-up recommendations. Electronically Signed   By: Elmon Hagedorn M.D.   On: 01/13/2024 15:55    Procedures Procedures    Medications Ordered in ED Medications - No data to display  ED Course/ Medical Decision Making/ A&P                          Medical Decision Making Amount and/or Complexity of Data Reviewed Labs: ordered. Decision-making details documented in ED Course. Radiology: ordered. Decision-making details documented in ED Course.    This patient presents to the ED for concern of fatigue/dizziness/malaise, this involves an extensive number of treatment options, and is a complaint that carries with it a high risk of complications and morbidity.  I considered the following differential and admission for this acute, potentially life threatening condition.   MDM:    DDX for generalized weakness includes but is not limited to:   No nystagmus or dizziness/vertigo, low c/f intracranial cause of sxs. Improved leg swelling from before and BNP within normal limits, no concern for heart failure exacerbation.  No chest pain, EKG without arrhythmia or ischemic signs, no concern for ACS.  Patient with no metabolic derangements or electrolyte  abnormalities.  She has no fever or chills, no increased cough or urinary symptoms with no UTI on UA, low concern for infectious process as well.  Her respiratory panel is negative also for viral syndrome though other viruses could be the cause.  Patient seems to relay some of her symptoms to her labile blood sugar.  Here now it is in the 200s which she states is improved from before.  Advised following up with her endocrine provider as scheduled next week and with her PCP.  Patient is overall on her normal oxygen  requirement without any increased work of breathing or hypoxia, otherwise feels  okay.  Doubt an acute emergent pathology causing patient's symptoms.   Clinical Course as of 01/13/24 February 08, 2232  Wed Jan 13, 2024  1504 Urinalysis, w/ Reflex to Culture (Infection Suspected) -Urine, Clean Catch(!) No UTI, no ketonuria, only glucosuria [HN]  1536 Vitamin B12: 590 Ordered from triage, wnl [HN]  1623 DG Chest Portable 1 View Lower lung volumes with mildly increased atelectasis at both lung bases. No definite acute cardiopulmonary process.   [HN]  1701 B Natriuretic Peptide: 37.4 wnl [HN]  1707 Resp panel by RT-PCR (RSV, Flu A&B, Covid) Anterior Nasal Swab neg [HN]  1707 Patient with no acute or emergent findings noted in her workup. Has follow-up in two weeks with her endocrinology provider. Has f/u with PCP planned for June, instructed her to call and move up if possible or get on cancellation list. Patient and her sister report understanding. DC w/ discharge instructions/return precautions. All questions answered to patient's satisfaction.   [HN]    Clinical Course User Index [HN] Merdis Stalling, MD    Labs: I Ordered, and personally interpreted labs.  The pertinent results include:  those listed above  Imaging Studies ordered: I ordered imaging studies including CXR I independently visualized and interpreted imaging. I agree with the radiologist interpretation  Additional history  obtained from chart review, sister at bedside.    Reevaluation: After the interventions noted above, I reevaluated the patient and found that they have :improved  Social Determinants of Health: Lives independently  Disposition: DC  Co morbidities that complicate the patient evaluation  Past Medical History:  Diagnosis Date   Allergy    Iodine, Penicillin, Tetracycline.  Patient reports reaction of hives, rash, no sob, no wheezing    Arthritis    .Right knee, right hip   Asthma    COPD (chronic obstructive pulmonary disease) (HCC)    Depression    Past year depression not sleeping after fiancee deceased 02-07-2013   Diabetes (HCC)    Hyperlipidemia    Dx 03/2014   Hypertension    Dx 03/2014   Peripheral arterial disease (HCC)    Pneumonia    hx of recently po antibiotics   Primary localized osteoarthritis of right knee      Medicines No orders of the defined types were placed in this encounter.   I have reviewed the patients home medicines and have made adjustments as needed  Problem List / ED Course: Problem List Items Addressed This Visit   None Visit Diagnoses       Fatigue, unspecified type    -  Primary     Hyperglycemia                       This note was created using dictation software, which may contain spelling or grammatical errors.    Merdis Stalling, MD 01/13/24 Feb 08, 2235

## 2024-01-13 NOTE — ED Notes (Signed)
Chest xray done at bedside.

## 2024-01-14 ENCOUNTER — Other Ambulatory Visit: Payer: Self-pay

## 2024-01-14 ENCOUNTER — Other Ambulatory Visit: Payer: Self-pay | Admitting: Internal Medicine

## 2024-01-14 ENCOUNTER — Telehealth: Payer: Self-pay

## 2024-01-14 DIAGNOSIS — J9601 Acute respiratory failure with hypoxia: Secondary | ICD-10-CM

## 2024-01-14 MED ORDER — TRELEGY ELLIPTA 100-62.5-25 MCG/ACT IN AEPB
1.0000 | INHALATION_SPRAY | Freq: Every day | RESPIRATORY_TRACT | 3 refills | Status: DC
  Filled 2024-01-14: qty 60, 30d supply, fill #0
  Filled 2024-02-07: qty 60, 30d supply, fill #1
  Filled 2024-03-18: qty 60, 30d supply, fill #2
  Filled 2024-04-15: qty 60, 30d supply, fill #3

## 2024-01-14 NOTE — Transitions of Care (Post Inpatient/ED Visit) (Signed)
 01/14/2024  Name: Yvonne Todd MRN: [id number] DOB: 07-22-1951  Today's TOC FU Call Status: Today's TOC FU Call Status:: Successful TOC FU Call Completed TOC FU Call Complete Date: 01/14/24 Patient's Name and Date of Birth confirmed.  Transition Care Management Follow-up Telephone Call Date of Discharge: 01/13/24 Discharge Facility: [facility]) Type of Discharge: Emergency Department Reason for ED Visit: Other: (fatigue) How have you been since you were released from the hospital?: Better Any questions or concerns?: No  Items Reviewed: Did you receive and understand the discharge instructions provided?: Yes Medications obtained,verified, and reconciled?: Yes (Medications Reviewed) Any new allergies since your discharge?: No Dietary orders reviewed?: Yes Do you have support at home?: No  Medications Reviewed Today: Medications Reviewed Today     Reviewed by Darrall Ellison, LPN (Licensed Practical Nurse) on 01/14/24 at 1533  Med List Status: <None>   Medication Order Taking? Sig Documenting Provider Last Dose Status Informant  albuterol  (PROVENTIL ) (2.5 MG/3ML) 0.083% nebulizer solution [id number] No Take 3 mLs (2.5 mg total) by nebulization 3 (three) times daily.  Patient taking differently: Take 2.5 mg by nebulization 3 (three) times daily as needed for wheezing or shortness of breath (RESPIRATORY FLARES).   Adelia Homestead, MD Past Month Active Self  albuterol  (VENTOLIN  HFA) 108 [phone] Base) MCG/ACT inhaler [id number] No Inhale 1-2 puffs into the lungs every 4 (four) hours as needed for wheezing or shortness of breath. Adelia Homestead, MD Unknown Active Self  aspirin  EC 81 MG tablet [id number] No Take 1 tablet (81 mg total) by mouth daily. Colin Dawley, MD Past Week Active Self  atorvastatin  (LIPITOR) 80 MG tablet [id number] No Take 1 tablet (80 mg total) by mouth at bedtime. Adelia Homestead, MD 01/12/2024 Bedtime Active Self  blood glucose meter kit and  supplies [id number] No Dispense based on patient and insurance preference. Use up to four times daily as directed. (FOR ICD-10 E10.9, E11.9). Adelia Homestead, MD Taking Active   Patient not taking:  Discontinued 02/21/20 1415   co-enzyme Q-10 30 MG capsule [id number] No Take 30 mg by mouth daily. [provider] 01/13/2024 Morning Active Self  Continuous Glucose Receiver Yvonna Herder G7 [place]) DEVI [id number]  To use with G7 sensors Adelia Homestead, MD  Active   Continuous Glucose Sensor (DEXCOM G7 SENSOR) MISC [id number] No Apply sensor every 10 days Adelia Homestead, MD 01/11/2024 Active Self  diphenhydrAMINE  (BENADRYL ) 25 MG tablet [id number] No Take 50 mg by mouth at bedtime. [provider] 01/12/2024 Bedtime Active Self  diphenhydramine -acetaminophen  (TYLENOL  PM) 25-500 MG TABS tablet [id number] No Take 2 tablets by mouth at bedtime as needed (for pain or sleep). [provider] Unknown Active Self  empagliflozin  (JARDIANCE ) 25 MG TABS tablet [id number] No Take 1 tablet (25 mg total) by mouth daily. Adelia Homestead, MD 01/13/2024 Active Self  fluticasone  (FLONASE ) 50 MCG/ACT nasal spray [id number] No Place 2 sprays into both nostrils daily.  Patient taking differently: Place 2 sprays into both nostrils daily as needed for allergies or rhinitis.   Adelia Homestead, MD Unknown Active Self  Fluticasone -Umeclidin-Vilant (TRELEGY ELLIPTA ) 100-62.5-25 MCG/ACT AEPB [id number]  Inhale 1 puff into the lungs daily. Adelia Homestead, MD  Active   furosemide  (LASIX ) 20 MG tablet [id number] No Take 1-2 tablets (20-40 mg total) by mouth daily.  Patient taking differently: Take 20 mg by mouth See admin instructions. Take 20 mg by mouth in the morning and at 12 NOON   [place], Nellie Banas A,  MD 01/13/2024 Morning Active Self  gabapentin  (NEURONTIN ) 300 MG capsule [id number] No Take 1 capsule (300 mg total) by mouth 3 (three) times daily. Adelia Homestead, MD  01/13/2024 Morning Active Self  glimepiride  (AMARYL ) 4 MG tablet [id number] No Take 1 tablet (4 mg total) by mouth daily with breakfast. Adelia Homestead, MD 01/13/2024 Morning Active Self  insulin  glargine (LANTUS  SOLOSTAR) 100 UNIT/ML Solostar Pen [id number] No Inject 30 Units into the skin daily as directed  Patient taking differently: Inject 30 Units into the skin at bedtime.   Adelia Homestead, MD 01/12/2024 Bedtime Active Self  Insulin  Pen Needle (COMFORT EZ PEN NEEDLES) 31G X 5 MM MISC [id number] No Use daily as directed to inject lantus  Adelia Homestead, MD Taking Active   Patient not taking:  Discontinued 02/21/20 1415   meloxicam  (MOBIC ) 15 MG tablet [id number] No Take 1 tablet (15 mg total) by mouth daily. Adelia Homestead, MD 01/13/2024 Morning Active Self  montelukast  (SINGULAIR ) 10 MG tablet [id number] No Take 1 tablet (10 mg total) by mouth at bedtime. Adelia Homestead, MD 01/12/2024 Bedtime Active Self  Multiple Vitamins-Minerals (CENTRUM ULTRA WOMENS) TABS [id number] No Take 1 tablet by mouth daily with breakfast. [provider] 01/13/2024 Morning Active Self  OXYGEN  [id number] No Inhale 3-5 L/min into the lungs continuous. [provider] 01/13/2024 Morning Active Self  Respiratory Therapy Supplies (NEBULIZER/TUBING/MOUTHPIECE) KIT [id number] No Use with nebulizer Adelia Homestead, MD Taking Active   valsartan  (DIOVAN ) 80 MG tablet [id number] No Take 1 tablet (80 mg total) by mouth daily.  Patient taking differently: Take 80 mg by mouth at bedtime.   Adelia Homestead, MD 01/12/2024 Bedtime Active Self            Home Care and Equipment/Supplies: Were Home Health Services Ordered?: NA Any new equipment or medical supplies ordered?: NA  Functional Questionnaire: Do you need assistance with bathing/showering or dressing?: No Do you need assistance with meal preparation?: No Do you need assistance with eating?: No Do you have difficulty  maintaining continence: No Do you need assistance with getting out of bed/getting out of a chair/moving?: No Do you have difficulty managing or taking your medications?: No  Follow up appointments reviewed: PCP Follow-up appointment confirmed?: No (declined) MD Provider Line Number:[phone] Given: No Specialist Hospital Follow-up appointment confirmed?: NA Do you need transportation to your follow-up appointment?: No Do you understand care options if your condition(s) worsen?: Yes-patient verbalized understanding    SIGNATURE Darrall Ellison, LPN [facility] Nurse Health Advisor Direct Dial [phone]

## 2024-01-19 ENCOUNTER — Other Ambulatory Visit (HOSPITAL_COMMUNITY): Payer: Self-pay

## 2024-01-19 ENCOUNTER — Other Ambulatory Visit: Payer: Self-pay

## 2024-01-20 ENCOUNTER — Other Ambulatory Visit: Payer: Self-pay

## 2024-01-26 ENCOUNTER — Other Ambulatory Visit (INDEPENDENT_AMBULATORY_CARE_PROVIDER_SITE_OTHER)

## 2024-01-26 ENCOUNTER — Telehealth: Payer: Self-pay | Admitting: Internal Medicine

## 2024-01-26 ENCOUNTER — Other Ambulatory Visit (HOSPITAL_COMMUNITY): Payer: Self-pay

## 2024-01-26 ENCOUNTER — Other Ambulatory Visit: Payer: Self-pay

## 2024-01-26 DIAGNOSIS — E118 Type 2 diabetes mellitus with unspecified complications: Secondary | ICD-10-CM

## 2024-01-26 NOTE — Patient Instructions (Signed)
 It was a pleasure speaking with you today!  Increase your Lantus  to 40 units at bedtime. Start taking metformin 500 mg twice daily with meals. We'll call you in a couple weeks to check in on your blood sugars with the Dexcom.  Feel free to call with any questions or concerns!  Abelina Abide, PharmD PGY1 Pharmacy Resident 01/26/2024 4:33 PM  Rainelle Bur, PharmD, BCPS, CPP Clinical Pharmacist Practitioner [facility] [phone]

## 2024-01-26 NOTE — Telephone Encounter (Signed)
 Patient contacted. See patient outreach encounter from today.  Abelina Abide, PharmD PGY1 Pharmacy Resident 01/26/2024 4:39 PM

## 2024-01-26 NOTE — Progress Notes (Unsigned)
 01/26/2024 Name: Yvonne Todd MRN: [id number] DOB: [birth year]  Chief Complaint  Patient presents with   Diabetes   Medication Management    Yvonne Todd is a 73 y.o. year old female who presented for a telephone visit with her sister.   They were referred to the pharmacist by their PCP for assistance in managing diabetes.   Subjective:  Care Team: Primary Care Provider: Adelia Homestead, MD ; Next Scheduled Visit: 02/24/24  Medication Access/Adherence  Current Pharmacy:  Yvonne Todd - [facility] [address] [place] [address] Phone: [phone] Fax: [phone]  Patient reports affordability concerns with their medications: No  Patient reports access/transportation concerns to their pharmacy: No  Patient reports adherence concerns with their medications:  No    Diabetes:  Current medications: Jardiance  25 mg daily, glimepiride  4 mg daily, Lantus  30 units daily at bedtime Medications tried in the past: Rybelsus  (pt thinks it was bad stomach upset & broke out in rash)  Uses Dexcom G7 Receiver 7-day Avg glucose 240 Time in range 17% Time very high 42% Time high 41%  3-day Avg glucose 218 Time in range 25% Time very high 30% Time high 45%  Patient unable to recall what fasting BGs have been in the AM and unable to locate in [facility] receiver.  Objective:  Lab Results  Component Value Date   HGBA1C 10 11/24/2023    Lab Results  Component Value Date   CREATININE 0.66 01/13/2024   BUN 19 01/13/2024   NA 140 01/13/2024   K 4.2 01/13/2024   CL 103 01/13/2024   CO2 26 01/13/2024    Lab Results  Component Value Date   CHOL 183 06/30/2023   HDL 33.50 (L) 06/30/2023   LDLCALC 111 (H) 02/18/2021   LDLDIRECT 74.0 06/30/2023   TRIG (H) 06/30/2023    467.0 Triglyceride is over 400; calculations on Lipids are invalid.   CHOLHDL 5 06/30/2023    Medications Reviewed Today     Reviewed by Yvonne Todd, [facility] (Pharmacist)  on 01/26/24 at 1351  Med List Status: <None>   Medication Order Taking? Sig Documenting Provider Last Dose Status Informant  albuterol  (PROVENTIL ) (2.5 MG/3ML) 0.083% nebulizer solution [id number]  Take 3 mLs (2.5 mg total) by nebulization 3 (three) times daily.  Patient taking differently: Take 2.5 mg by nebulization 3 (three) times daily as needed for wheezing or shortness of breath (RESPIRATORY FLARES).   Yvonne Homestead, MD  Active Self  albuterol  (VENTOLIN  HFA) 108 [phone] Base) MCG/ACT inhaler [id number]  Inhale 1-2 puffs into the lungs every 4 (four) hours as needed for wheezing or shortness of breath. Yvonne Homestead, MD  Active Self  aspirin  EC 81 MG tablet [id number]  Take 1 tablet (81 mg total) by mouth daily. Colin Dawley, MD  Active Self  atorvastatin  (LIPITOR) 80 MG tablet [id number]  Take 1 tablet (80 mg total) by mouth at bedtime. Yvonne Homestead, MD  Active Self  blood glucose meter kit and supplies [id number]  Dispense based on patient and insurance preference. Use up to four times daily as directed. (FOR ICD-10 E10.9, E11.9). Yvonne Homestead, MD  Active    Patient not taking:   Discontinued 02/21/20 1415   co-enzyme Q-10 30 MG capsule [id number]  Take 30 mg by mouth daily. [provider]  Active Self  Continuous Glucose Receiver Yvonna Herder G7 [place]) DEVI [id number]  To use with G7 sensors Yvonne Homestead, MD  Active  Continuous Glucose Sensor (DEXCOM G7 SENSOR) MISC [id number]  Apply sensor every 10 days Yvonne Homestead, MD  Active Self  diphenhydrAMINE  (BENADRYL ) 25 MG tablet [id number]  Take 50 mg by mouth at bedtime. [provider]  Active Self  diphenhydramine -acetaminophen  (TYLENOL  PM) 25-500 MG TABS tablet [id number]  Take 2 tablets by mouth at bedtime as needed (for pain or sleep). [provider]  Active Self  empagliflozin  (JARDIANCE ) 25 MG TABS tablet [id number] Yes Take 1 tablet (25 mg total) by mouth daily.  Yvonne Homestead, MD Taking Active Self  fluticasone  (FLONASE ) 50 MCG/ACT nasal spray [id number]  Place 2 sprays into both nostrils daily.  Patient taking differently: Place 2 sprays into both nostrils daily as needed for allergies or rhinitis.   Yvonne Homestead, MD  Active Self  Fluticasone -Umeclidin-Vilant (TRELEGY ELLIPTA ) 100-62.5-25 MCG/ACT AEPB [id number]  Inhale 1 puff into the lungs daily. Yvonne Homestead, MD  Active   furosemide  (LASIX ) 20 MG tablet [id number]  Take 1-2 tablets (20-40 mg total) by mouth daily.  Patient taking differently: Take 20 mg by mouth See admin instructions. Take 20 mg by mouth in the morning and at 12 Annabel Kettle, Marjory Signs, MD  Active Self  gabapentin  (NEURONTIN ) 300 MG capsule [id number]  Take 1 capsule (300 mg total) by mouth 3 (three) times daily. Yvonne Homestead, MD  Active Self  glimepiride  (AMARYL ) 4 MG tablet [id number] Yes Take 1 tablet (4 mg total) by mouth daily with breakfast. Yvonne Homestead, MD Taking Active Self  insulin  glargine (LANTUS  SOLOSTAR) 100 UNIT/ML Solostar Pen [id number] Yes Inject 30 Units into the skin daily as directed  Patient taking differently: Inject 30 Units into the skin at bedtime.   Yvonne Homestead, MD Taking Active Self  Insulin  Pen Needle (COMFORT EZ PEN NEEDLES) 31G X 5 MM MISC [id number]  Use daily as directed to inject lantus  Yvonne Homestead, MD  Active    Patient not taking:   Discontinued 02/21/20 1415   meloxicam  (MOBIC ) 15 MG tablet [id number]  Take 1 tablet (15 mg total) by mouth daily. Yvonne Homestead, MD  Active Self  montelukast  (SINGULAIR ) 10 MG tablet [id number]  Take 1 tablet (10 mg total) by mouth at bedtime. Yvonne Homestead, MD  Active Self  Multiple Vitamins-Minerals (CENTRUM ULTRA WOMENS) TABS [id number]  Take 1 tablet by mouth daily with breakfast. [provider]  Active Self  OXYGEN  [id number]  Inhale 3-5 L/min into the lungs continuous. [provider]  Active Self  Respiratory Therapy Supplies (NEBULIZER/TUBING/MOUTHPIECE) KIT [id number]  Use with nebulizer Yvonne Homestead, MD  Active   valsartan  (DIOVAN ) 80 MG tablet [id number]  Take 1 tablet (80 mg total) by mouth daily.  Patient taking differently: Take 80 mg by mouth at bedtime.   Yvonne Homestead, MD  Active Self            Assessment/Plan:   Diabetes: - Currently uncontrolled, A1c goal <8% & CGM readings are often very high - Hesitant to start GLP-1 given unexplained 22lb weight loss from March 25th to May 14th ED visit. - Increase Lantus  to 40 units daily at bedtime - Start metformin  ER 500 mg BID with meals - Continue to check BG with Dexcom  Follow Up Plan: 6/10  Abelina Abide, PharmD PGY1 Pharmacy Resident 01/26/2024 2:31 PM  Rainelle Bur, PharmD, BCPS, CPP Clinical Pharmacist Practitioner [facility] at [facility] [phone]

## 2024-01-26 NOTE — Telephone Encounter (Signed)
 Copied from CRM [id number]. Topic: General - Other >> Jan 26, 2024  2:17 PM Kita Perish H wrote: Reason for CRM: Patient was speaking with Camilo Cella the Pharmacist during her appointment today and phone died while they were talking and patient is calling back, please reach back out, thanks.  Orinda Birkenhead [phone] cell phone

## 2024-01-27 ENCOUNTER — Other Ambulatory Visit: Payer: Self-pay

## 2024-01-27 ENCOUNTER — Other Ambulatory Visit (HOSPITAL_COMMUNITY): Payer: Self-pay

## 2024-01-27 MED ORDER — LANTUS SOLOSTAR 100 UNIT/ML ~~LOC~~ SOPN
40.0000 [IU] | PEN_INJECTOR | Freq: Every day | SUBCUTANEOUS | Status: DC
Start: 2024-01-27 — End: 2024-03-08

## 2024-01-27 MED ORDER — METFORMIN HCL ER 500 MG PO TB24
500.0000 mg | ORAL_TABLET | Freq: Two times a day (BID) | ORAL | 2 refills | Status: DC
  Filled 2024-01-27: qty 60, 30d supply, fill #0
  Filled 2024-02-24: qty 60, 30d supply, fill #1
  Filled 2024-03-22: qty 60, 30d supply, fill #2

## 2024-02-01 ENCOUNTER — Other Ambulatory Visit: Payer: Self-pay | Admitting: Internal Medicine

## 2024-02-02 ENCOUNTER — Other Ambulatory Visit: Payer: Self-pay

## 2024-02-02 ENCOUNTER — Other Ambulatory Visit (HOSPITAL_COMMUNITY): Payer: Self-pay

## 2024-02-02 MED ORDER — FLUTICASONE PROPIONATE 50 MCG/ACT NA SUSP
2.0000 | Freq: Every day | NASAL | 2 refills | Status: AC
  Filled 2024-02-02: qty 48, 90d supply, fill #0
  Filled 2024-05-28: qty 48, 90d supply, fill #1

## 2024-02-03 DIAGNOSIS — J9611 Chronic respiratory failure with hypoxia: Secondary | ICD-10-CM | POA: Diagnosis not present

## 2024-02-03 DIAGNOSIS — J449 Chronic obstructive pulmonary disease, unspecified: Secondary | ICD-10-CM | POA: Diagnosis not present

## 2024-02-08 ENCOUNTER — Other Ambulatory Visit (HOSPITAL_COMMUNITY): Payer: Self-pay

## 2024-02-09 ENCOUNTER — Other Ambulatory Visit (HOSPITAL_COMMUNITY): Payer: Self-pay

## 2024-02-09 ENCOUNTER — Other Ambulatory Visit (INDEPENDENT_AMBULATORY_CARE_PROVIDER_SITE_OTHER): Admitting: Pharmacist

## 2024-02-09 ENCOUNTER — Other Ambulatory Visit: Payer: Self-pay

## 2024-02-09 DIAGNOSIS — Z7984 Long term (current) use of oral hypoglycemic drugs: Secondary | ICD-10-CM

## 2024-02-09 DIAGNOSIS — E118 Type 2 diabetes mellitus with unspecified complications: Secondary | ICD-10-CM

## 2024-02-09 MED ORDER — GLIPIZIDE 2.5 MG PO TABS
1.0000 | ORAL_TABLET | Freq: Two times a day (BID) | ORAL | 0 refills | Status: DC
Start: 2024-02-09 — End: 2024-02-23
  Filled 2024-02-09 – 2024-02-11 (×2): qty 60, 30d supply, fill #0
  Filled 2024-02-19: qty 180, 90d supply, fill #0

## 2024-02-09 NOTE — Patient Instructions (Signed)
 It was a pleasure speaking with you today!  STOP glimepiride  and START glipizide  2.5 mg twice daily with breakfast and supper.   Continue Lantus  40 units daily, metformin  500 mg twice daily, and Jardiance  25 mg daily.  Feel free to call with any questions or concerns!  Rainelle Bur, PharmD, BCPS, CPP Clinical Pharmacist Practitioner [facility] at [facility] [phone]

## 2024-02-09 NOTE — Progress Notes (Signed)
   02/09/2024 Name: KOLLINS FENTER MRN: [id number] DOB: 07-30-1951  No chief complaint on file.   TORIANNA Todd is a 73 y.o. year old female who presented for a telephone visit with her sister.   They were referred to the pharmacist by their PCP for assistance in managing diabetes.   Subjective:  Care Team: Primary Care Provider: Adelia Homestead, MD ; Next Scheduled Visit: 02/24/24  Medication Access/Adherence  Current Pharmacy:  Maryan Smalling - [facility] [address] [place] [address] Phone: [phone] Fax: [phone]  Patient reports affordability concerns with their medications: No  Patient reports access/transportation concerns to their pharmacy: No  Patient reports adherence concerns with their medications:  No    Diabetes:  Current medications: Jardiance  25 mg daily, glimepiride  4 mg daily, Lantus  40 units daily at bedtime, metformin  XR 500 mg BID Medications tried in the past: Rybelsus  (pt thinks it was bad stomach upset & broke out in rash)  -She reports nausea with metformin  but feels it has been improving the longer she has been on it  Uses Dexcom G7 Receiver 14 day report Average Glucose 198 GMI 8% Time in Range 16% very high 43% high 39% in range 1% low 1% very low -Pt reports lows have happened in the early AM hours/overnight  Prior to starting metformin  and increasing Lantus : 7-day Avg glucose 240 Time in range 17% Time very high 42% Time high 41%  Objective:  Lab Results  Component Value Date   HGBA1C 10 11/24/2023    Lab Results  Component Value Date   CREATININE 0.66 01/13/2024   BUN 19 01/13/2024   NA 140 01/13/2024   K 4.2 01/13/2024   CL 103 01/13/2024   CO2 26 01/13/2024    Lab Results  Component Value Date   CHOL 183 06/30/2023   HDL 33.50 (L) 06/30/2023   LDLCALC 111 (H) 02/18/2021   LDLDIRECT 74.0 06/30/2023   TRIG (H) 06/30/2023    467.0 Triglyceride is over 400; calculations on  Lipids are invalid.   CHOLHDL 5 06/30/2023    Medications Reviewed Today   Medications were not reviewed in this encounter     Assessment/Plan:   Diabetes: - Currently uncontrolled, A1c goal <8% & CGM readings are often high but have improved. Now experiencing some hypoglycemia - Hesitant to start GLP-1 given unexplained 22lb weight loss from March 25th to May 14th ED visit. - Continue Lantus  40 units, metformin  500 mg BID, and Jardiance  25 mg daily - Due to hypoglycemia, recommend discontinuing glimepiride  and start glipizide  2.5 mg twice daily with food - Continue to check BG with Dexcom  Follow Up Plan: 6/24   Rainelle Bur, PharmD, BCPS, CPP Clinical Pharmacist Practitioner [facility] at [facility] [phone]

## 2024-02-10 ENCOUNTER — Other Ambulatory Visit (HOSPITAL_COMMUNITY): Payer: Self-pay

## 2024-02-10 ENCOUNTER — Other Ambulatory Visit: Payer: Self-pay

## 2024-02-10 ENCOUNTER — Telehealth: Payer: Self-pay | Admitting: Internal Medicine

## 2024-02-10 NOTE — Telephone Encounter (Signed)
 Copied from CRM #[id number]. Topic: Clinical - Prescription Issue >> Feb 10, 2024  3:40 PM Magdalene School wrote: Reason for CRM: Patient called stating that her pharmacy contacted her regarding glipiZIDE  2.5 MG TABS stating that it is not covered by insurance and it will cost over $100 out-of-pocket. Patient would like to check if there are any alternatives to the medication that would be covered by her insurance.

## 2024-02-11 ENCOUNTER — Other Ambulatory Visit (HOSPITAL_COMMUNITY): Payer: Self-pay

## 2024-02-11 ENCOUNTER — Telehealth: Payer: Self-pay

## 2024-02-11 NOTE — Telephone Encounter (Signed)
 Pharmacy Patient Advocate Encounter   Received notification from CoverMyMeds that prior authorization for Glipizide  2.5MG  tablets is required/requested.   Insurance verification completed.   The patient is insured through [facility] .   Per test claim: PA required; PA submitted to above mentioned insurance via CoverMyMeds Key/confirmation #/EOC [facility] Status is pending

## 2024-02-15 NOTE — Telephone Encounter (Signed)
 Pharmacy Patient Advocate Encounter  Received notification from [facility] that Prior Authorization for glipiZIDE  2.5MG  tablets  has been DENIED.  See denial reason below. No denial letter attached in CMM. Will attach denial letter to Media tab once received.   PA #/Case ID/Reference #:  [id number]

## 2024-02-19 ENCOUNTER — Other Ambulatory Visit: Payer: Self-pay | Admitting: Internal Medicine

## 2024-02-19 ENCOUNTER — Other Ambulatory Visit: Payer: Self-pay

## 2024-02-19 ENCOUNTER — Other Ambulatory Visit (HOSPITAL_COMMUNITY): Payer: Self-pay

## 2024-02-19 DIAGNOSIS — J9601 Acute respiratory failure with hypoxia: Secondary | ICD-10-CM

## 2024-02-22 ENCOUNTER — Other Ambulatory Visit (HOSPITAL_COMMUNITY): Payer: Self-pay

## 2024-02-22 MED ORDER — ALBUTEROL SULFATE (2.5 MG/3ML) 0.083% IN NEBU
2.5000 mg | INHALATION_SOLUTION | Freq: Three times a day (TID) | RESPIRATORY_TRACT | 2 refills | Status: AC
  Filled 2024-02-22: qty 75, 9d supply, fill #0

## 2024-02-23 ENCOUNTER — Other Ambulatory Visit (INDEPENDENT_AMBULATORY_CARE_PROVIDER_SITE_OTHER): Admitting: Pharmacist

## 2024-02-23 ENCOUNTER — Other Ambulatory Visit: Payer: Self-pay

## 2024-02-23 DIAGNOSIS — E118 Type 2 diabetes mellitus with unspecified complications: Secondary | ICD-10-CM

## 2024-02-23 MED ORDER — GLIPIZIDE 5 MG PO TABS
2.5000 mg | ORAL_TABLET | Freq: Two times a day (BID) | ORAL | 0 refills | Status: DC
  Filled 2024-02-23: qty 45, 45d supply, fill #0

## 2024-02-23 NOTE — Progress Notes (Unsigned)
 02/23/2024 Name: Yvonne Todd MRN: [id number] DOB: 1951/05/17  Chief Complaint  Patient presents with   Diabetes   Medication Management    Yvonne Todd is a 73 y.o. year old female who presented for a telephone visit with her sister.   They were referred to the pharmacist by their PCP for assistance in managing diabetes.   Subjective:  Care Team: Primary Care Provider: Rollene Almarie LABOR, MD ; Next Scheduled Visit: 02/26/24  Medication Access/Adherence  Current Pharmacy:  DARRYLE LAW - [facility] [address] [place] [address] Phone: [phone] Fax: [phone]  Patient reports affordability concerns with their medications: No  Patient reports access/transportation concerns to their pharmacy: No  Patient reports adherence concerns with their medications:  No    Diabetes:  Current medications: Jardiance  25 mg daily, glimepiride  4 mg daily, Lantus  40 units daily at bedtime, metformin  XR 500 mg BID Medications tried in the past: Rybelsus  (pt thinks it was bad stomach upset & broke out in rash)  -She reports nausea with metformin  but feels it has been improving the longer she has been on it *Has not started glipizide  due to glipizide  2.5 mg being denied therefore pharmacy did not fill.   Uses Dexcom G7 Receiver 14 day report Average Glucose 170 GMI 8% Time in Range 5% very high 36% high 58% in range 1% low 0% very low  Prior to starting metformin  and increasing Lantus : 7-day Avg glucose 240 Time in range 17% Time very high 42% Time high 41%  Objective:  Lab Results  Component Value Date   HGBA1C 10 11/24/2023    Lab Results  Component Value Date   CREATININE 0.66 01/13/2024   BUN 19 01/13/2024   NA 140 01/13/2024   K 4.2 01/13/2024   CL 103 01/13/2024   CO2 26 01/13/2024    Lab Results  Component Value Date   CHOL 183 06/30/2023   HDL 33.50 (L) 06/30/2023   LDLCALC 111 (H) 02/18/2021   LDLDIRECT 74.0  06/30/2023   TRIG (H) 06/30/2023    467.0 Triglyceride is over 400; calculations on Lipids are invalid.   CHOLHDL 5 06/30/2023    Medications Reviewed Today     Reviewed by Merceda Lela SAUNDERS, [facility] (Pharmacist) on 02/23/24 at 1659  Med List Status: <None>   Medication Order Taking? Sig Documenting Provider Last Dose Status Informant  albuterol  (PROVENTIL ) (2.5 MG/3ML) 0.083% nebulizer solution [id number]  Inhale 3 mLs (2.5 mg total) by nebulization 3 (three) times daily. Rollene Almarie LABOR, MD  Active   albuterol  (VENTOLIN  HFA) 108 [phone] Base) MCG/ACT inhaler [id number]  Inhale 1-2 puffs into the lungs every 4 (four) hours as needed for wheezing or shortness of breath. Rollene Almarie LABOR, MD  Active Self  aspirin  EC 81 MG tablet [id number]  Take 1 tablet (81 mg total) by mouth daily. Pearlean Manus, MD  Active Self  atorvastatin  (LIPITOR) 80 MG tablet [id number]  Take 1 tablet (80 mg total) by mouth at bedtime. Rollene Almarie LABOR, MD  Active Self  blood glucose meter kit and supplies [id number]  Dispense based on patient and insurance preference. Use up to four times daily as directed. (FOR ICD-10 E10.9, E11.9). Rollene Almarie LABOR, MD  Active    Patient not taking:   Discontinued 02/21/20 1415   co-enzyme Q-10 30 MG capsule [id number]  Take 30 mg by mouth daily. [provider]  Active Self  Continuous Glucose Receiver [facility] G7 RECEIVER) [place] [id number]  To use with G7 sensors Rollene Almarie LABOR, MD  Active   Continuous Glucose Sensor (DEXCOM G7 [place]) [place] [id number]  Apply sensor every 10 days Rollene Almarie LABOR, MD  Active Self  diphenhydrAMINE  (BENADRYL ) 25 MG tablet [id number]  Take 50 mg by mouth at bedtime. [provider]  Active Self  diphenhydramine -acetaminophen  (TYLENOL  PM) 25-500 MG TABS tablet [id number]  Take 2 tablets by mouth at bedtime as needed (for pain or sleep). [provider]  Active Self  empagliflozin  (JARDIANCE ) 25 MG TABS tablet  [id number] Yes Take 1 tablet (25 mg total) by mouth daily. Rollene Almarie LABOR, MD  Active Self  fluticasone  (FLONASE ) 50 MCG/ACT nasal spray [id number]  Place 2 sprays into both nostrils daily. Rollene Almarie LABOR, MD  Active   Fluticasone -Umeclidin-Vilant (TRELEGY ELLIPTA ) 100-62.5-25 MCG/ACT AEPB [id number]  Inhale 1 puff into the lungs daily. Rollene Almarie LABOR, MD  Active   furosemide  (LASIX ) 20 MG tablet [id number]  Take 1-2 tablets (20-40 mg total) by mouth daily.  Patient taking differently: Take 20 mg by mouth See admin instructions. Take 20 mg by mouth in the morning and at 12 CURLY Rollene, Almarie LABOR, MD  Active Self  gabapentin  (NEURONTIN ) 300 MG capsule [id number]  Take 1 capsule (300 mg total) by mouth 3 (three) times daily. Rollene Almarie LABOR, MD  Active Self  glipiZIDE  2.5 MG TABS [id number]  Take 1 tablet by mouth 2 (two) times daily with a meal.  Patient not taking: Reported on 02/23/2024   Rollene Almarie LABOR, MD  Active   insulin  glargine (LANTUS  SOLOSTAR) 100 UNIT/ML Solostar Pen [id number] Yes Inject 40 Units into the skin daily. Rollene Almarie LABOR, MD  Active   Insulin  Pen Needle (COMFORT EZ PEN NEEDLES) 31G X 5 MM MISC [id number]  Use daily as directed to inject lantus  Rollene Almarie LABOR, MD  Active    Patient not taking:   Discontinued 02/21/20 1415   meloxicam  (MOBIC ) 15 MG tablet [id number]  Take 1 tablet (15 mg total) by mouth daily. Rollene Almarie LABOR, MD  Active Self  metFORMIN  (GLUCOPHAGE -XR) 500 MG 24 hr tablet [id number] Yes Take 1 tablet (500 mg total) by mouth 2 (two) times daily with a meal. Rollene Almarie LABOR, MD  Active   montelukast  (SINGULAIR ) 10 MG tablet [id number]  Take 1 tablet (10 mg total) by mouth at bedtime. Rollene Almarie LABOR, MD  Active Self  Multiple Vitamins-Minerals (CENTRUM ULTRA WOMENS) TABS [id number]  Take 1 tablet by mouth daily with breakfast. [provider]  Active Self  OXYGEN  [id number]  Inhale 3-5 L/min into  the lungs continuous. [provider]  Active Self  Respiratory Therapy Supplies (NEBULIZER/TUBING/MOUTHPIECE) KIT [id number]  Use with nebulizer Rollene Almarie LABOR, MD  Active   valsartan  (DIOVAN ) 80 MG tablet [id number]  Take 1 tablet (80 mg total) by mouth daily.  Patient taking differently: Take 80 mg by mouth at bedtime.   Rollene Almarie LABOR, MD  Active Self            Assessment/Plan:   Diabetes: - Currently uncontrolled, A1c goal <8% & CGM readings have continued to improve from 17% in range to 58% in range after starting metformin  and increasing Lantus  - Hesitant to start GLP-1 given unexplained 22lb weight loss from March 25th to May 14th ED visit. - Continue Lantus  40 units, metformin  500 mg BID, and Jardiance  25 mg daily - Due to hypoglycemia, recommend discontinuing glimepiride  and start glipizide  5mg  1/2 tablet twice daily with  food - Continue to check BG with Dexcom  Follow Up Plan: 7/8   Darrelyn Drum, PharmD, BCPS, CPP Clinical Pharmacist Practitioner [place] [facility] [phone]

## 2024-02-24 ENCOUNTER — Other Ambulatory Visit: Payer: Self-pay

## 2024-02-24 ENCOUNTER — Other Ambulatory Visit (HOSPITAL_COMMUNITY): Payer: Self-pay

## 2024-02-24 ENCOUNTER — Ambulatory Visit: Admitting: Internal Medicine

## 2024-02-24 NOTE — Patient Instructions (Signed)
 It was a pleasure speaking with you today!  STOP glimepiride  when STARTING glipizide  5 mg 1/2 tablet twice daily with food. Continue all other medications.  Feel free to call with any questions or concerns!  Darrelyn Drum, PharmD, BCPS, CPP Clinical Pharmacist Practitioner [facility] at [facility] [phone]

## 2024-02-26 ENCOUNTER — Encounter: Payer: Self-pay | Admitting: Internal Medicine

## 2024-02-26 ENCOUNTER — Ambulatory Visit (INDEPENDENT_AMBULATORY_CARE_PROVIDER_SITE_OTHER): Admitting: Internal Medicine

## 2024-02-26 VITALS — BP 120/100 | HR 95 | Temp 98.2°F | Ht 62.0 in | Wt 196.0 lb

## 2024-02-26 DIAGNOSIS — I10 Essential (primary) hypertension: Secondary | ICD-10-CM | POA: Diagnosis not present

## 2024-02-26 DIAGNOSIS — Z794 Long term (current) use of insulin: Secondary | ICD-10-CM

## 2024-02-26 DIAGNOSIS — Z7984 Long term (current) use of oral hypoglycemic drugs: Secondary | ICD-10-CM

## 2024-02-26 DIAGNOSIS — E118 Type 2 diabetes mellitus with unspecified complications: Secondary | ICD-10-CM

## 2024-02-26 LAB — POCT GLYCOSYLATED HEMOGLOBIN (HGB A1C): HbA1c POC (<> result, manual entry): 7.6 % (ref 4.0–5.6)

## 2024-02-26 NOTE — Assessment & Plan Note (Signed)
 Hypertensive response to activity. Normal at rest. Continue lasix , valsartan  and recent labs upto date.

## 2024-02-26 NOTE — Patient Instructions (Signed)
 Your HgA1c is 7.6 today which is better.   We will keep the plan for the medicines the same.

## 2024-02-26 NOTE — Assessment & Plan Note (Addendum)
 POC HgA1c done and improving to 7.6 today. She is getting glipizide  to change to 2.5 mg BID from amaryl  daily due to spiking sugars and is taking metformin  and lantus  40 units daily. Seeing pharmacy for co-management and will continue. Follow up 3-6 months. Given moderate PAD we should shoot for as close to 7 as possible for goal HgA1c.

## 2024-02-26 NOTE — Progress Notes (Signed)
   Subjective:   Patient ID: Yvonne Todd, female    DOB: [birth year], 73 y.o.   MRN: [id number]  HPI The patient is a 73 YO female coming in for medical management (see A/P for details)  Review of Systems  Constitutional: Negative.   HENT: Negative.    Eyes: Negative.   Respiratory:  Positive for shortness of breath. Negative for cough and chest tightness.   Cardiovascular:  Negative for chest pain, palpitations and leg swelling.  Gastrointestinal:  Negative for abdominal distention, abdominal pain, constipation, diarrhea, nausea and vomiting.  Musculoskeletal: Negative.   Skin: Negative.   Neurological: Negative.   Psychiatric/Behavioral: Negative.      Objective:  Physical Exam Constitutional:      Appearance: She is well-developed.  HENT:     Head: Normocephalic and atraumatic.   Cardiovascular:     Rate and Rhythm: Normal rate and regular rhythm.  Pulmonary:     Effort: Pulmonary effort is normal. No respiratory distress.     Breath sounds: Normal breath sounds. No wheezing or rales.     Comments: On oxygen  via nasal canula Abdominal:     General: Bowel sounds are normal. There is no distension.     Palpations: Abdomen is soft.     Tenderness: There is no abdominal tenderness. There is no rebound.   Musculoskeletal:     Cervical back: Normal range of motion.   Skin:    General: Skin is warm and dry.   Neurological:     Mental Status: She is alert and oriented to person, place, and time.     Coordination: Coordination normal.     Vitals:   02/26/24 1145 02/26/24 1153  BP: (!) 120/100 (!) 120/100  Pulse: 95   Temp: 98.2 F (36.8 C)   TempSrc: Oral   SpO2: 98%   Weight: 196 lb (88.9 kg)   Height: 5' 2 (1.575 m)     Assessment & Plan:

## 2024-03-04 DIAGNOSIS — J9611 Chronic respiratory failure with hypoxia: Secondary | ICD-10-CM | POA: Diagnosis not present

## 2024-03-04 DIAGNOSIS — J449 Chronic obstructive pulmonary disease, unspecified: Secondary | ICD-10-CM | POA: Diagnosis not present

## 2024-03-08 ENCOUNTER — Other Ambulatory Visit (INDEPENDENT_AMBULATORY_CARE_PROVIDER_SITE_OTHER): Admitting: Pharmacist

## 2024-03-08 DIAGNOSIS — E118 Type 2 diabetes mellitus with unspecified complications: Secondary | ICD-10-CM

## 2024-03-08 MED ORDER — LANTUS SOLOSTAR 100 UNIT/ML ~~LOC~~ SOPN
35.0000 [IU] | PEN_INJECTOR | Freq: Every day | SUBCUTANEOUS | 1 refills | Status: DC
Start: 2024-03-08 — End: 2024-04-29
  Filled 2024-03-08: qty 15, 42d supply, fill #0
  Filled 2024-04-15: qty 15, 42d supply, fill #1

## 2024-03-08 MED ORDER — GLIPIZIDE 5 MG PO TABS
5.0000 mg | ORAL_TABLET | Freq: Two times a day (BID) | ORAL | 0 refills | Status: DC
  Filled 2024-03-08 – 2024-03-18 (×3): qty 60, 30d supply, fill #0

## 2024-03-08 NOTE — Patient Instructions (Signed)
 It was a pleasure speaking with you today!  - Decrease Lantus  to 35 units daily due to fasting hypoglycemia - Continue metformin  500 mg BID, and Jardiance  25 mg daily - Increase glipizide  to 5 mg 1 tablet twice daily with food - Continue to check BG with Dexcom  Feel free to call with any questions or concerns!  Darrelyn Drum, PharmD, BCPS, CPP Clinical Pharmacist Practitioner [place] [facility] at [facility] [phone]

## 2024-03-08 NOTE — Progress Notes (Signed)
 03/08/2024 Name: Yvonne Todd MRN: [id number] DOB: 1950-11-30  Chief Complaint  Patient presents with   Diabetes   Medication Management    JOUD PETTINATO is a 73 y.o. year old female who presented for a telephone visit with her sister.   They were referred to the pharmacist by their PCP for assistance in managing diabetes.   Subjective:  Care Team: Primary Care Provider: Rollene Almarie LABOR, MD ; Next Scheduled Visit: 02/26/24  Medication Access/Adherence  Current Pharmacy:  DARRYLE LAW - [facility] [address] [place] [address] Phone: [phone] Fax: [phone]  Patient reports affordability concerns with their medications: No  Patient reports access/transportation concerns to their pharmacy: No  Patient reports adherence concerns with their medications:  No    Diabetes:  Current medications: Jardiance  25 mg daily, glipizide  5 mg 1/2 tab twice daily with food, Lantus  40 units daily at bedtime, metformin  XR 500 mg BID Medications tried in the past: Rybelsus  (pt thinks it was bad stomach upset & broke out in rash)  -She reports nausea with metformin  but feels it has been improving the longer she has been on it   Uses Dexcom G7 Receiver 14 day report Average Glucose 193 GMI 7.9% Time in Range 18% very high 37% high 44% in range 1% low 0% very low *Having lows in the AM/fasting down to 60-90s  14 day report Average Glucose 170 GMI 8% Time in Range 5% very high 36% high 58% in range 1% low 0% very low    Prior to starting metformin  and increasing Lantus : 7-day Avg glucose 240 Time in range 17% Time very high 42% Time high 41%  Objective:  Lab Results  Component Value Date   HGBA1C 7.6 02/26/2024    Lab Results  Component Value Date   CREATININE 0.66 01/13/2024   BUN 19 01/13/2024   NA 140 01/13/2024   K 4.2 01/13/2024   CL 103 01/13/2024   CO2 26 01/13/2024    Lab Results  Component Value Date   CHOL  183 06/30/2023   HDL 33.50 (L) 06/30/2023   LDLCALC 111 (H) 02/18/2021   LDLDIRECT 74.0 06/30/2023   TRIG (H) 06/30/2023    467.0 Triglyceride is over 400; calculations on Lipids are invalid.   CHOLHDL 5 06/30/2023    Medications Reviewed Today     Reviewed by Merceda Lela SAUNDERS, [facility] (Pharmacist) on 03/08/24 at 1444  Med List Status: <None>   Medication Order Taking? Sig Documenting Provider Last Dose Status Informant  albuterol  (PROVENTIL ) (2.5 MG/3ML) 0.083% nebulizer solution [id number]  Inhale 3 mLs (2.5 mg total) by nebulization 3 (three) times daily. Rollene Almarie LABOR, MD  Active   albuterol  (VENTOLIN  HFA) 108 [phone] Base) MCG/ACT inhaler [id number]  Inhale 1-2 puffs into the lungs every 4 (four) hours as needed for wheezing or shortness of breath. Rollene Almarie LABOR, MD  Active Self  aspirin  EC 81 MG tablet [id number]  Take 1 tablet (81 mg total) by mouth daily. Pearlean Manus, MD  Active Self  atorvastatin  (LIPITOR) 80 MG tablet [id number]  Take 1 tablet (80 mg total) by mouth at bedtime. Rollene Almarie LABOR, MD  Active Self  blood glucose meter kit and supplies [id number]  Dispense based on patient and insurance preference. Use up to four times daily as directed. (FOR ICD-10 E10.9, E11.9). Rollene Almarie LABOR, MD  Active    Patient not taking:   Discontinued 02/21/20 1415   co-enzyme Q-10 30 MG capsule  [id number]  Take 30 mg by mouth daily. [provider]  Active Self  Continuous Glucose Receiver WILMOT G7 [place]) DEVI [id number]  To use with G7 sensors Rollene Almarie LABOR, MD  Active   Continuous Glucose Sensor (DEXCOM G7 [place]) [place] [id number]  Apply sensor every 10 days Rollene Almarie LABOR, MD  Active Self  diphenhydrAMINE  (BENADRYL ) 25 MG tablet [id number]  Take 50 mg by mouth at bedtime. [provider]  Active Self  diphenhydramine -acetaminophen  (TYLENOL  PM) 25-500 MG TABS tablet [id number]  Take 2 tablets by mouth at bedtime as needed (for pain or  sleep). [provider]  Active Self  empagliflozin  (JARDIANCE ) 25 MG TABS tablet [id number] Yes Take 1 tablet (25 mg total) by mouth daily. Rollene Almarie LABOR, MD  Active Self  fluticasone  (FLONASE ) 50 MCG/ACT nasal spray [id number]  Place 2 sprays into both nostrils daily. Rollene Almarie LABOR, MD  Active   Fluticasone -Umeclidin-Vilant (TRELEGY ELLIPTA ) 100-62.5-25 MCG/ACT AEPB [id number]  Inhale 1 puff into the lungs daily. Rollene Almarie LABOR, MD  Active   furosemide  (LASIX ) 20 MG tablet [id number]  Take 1-2 tablets (20-40 mg total) by mouth daily.  Patient taking differently: Take 20 mg by mouth See admin instructions. Take 20 mg by mouth in the morning and at 12 CURLY Rollene, Almarie LABOR, MD  Active Self  gabapentin  (NEURONTIN ) 300 MG capsule [id number]  Take 1 capsule (300 mg total) by mouth 3 (three) times daily. Rollene Almarie LABOR, MD  Active Self  glipiZIDE  (GLUCOTROL ) 5 MG tablet [id number] Yes Take 0.5 tablets (2.5 mg total) by mouth 2 (two) times daily before a meal. Rollene Almarie LABOR, MD  Active   insulin  glargine (LANTUS  SOLOSTAR) 100 UNIT/ML Solostar Pen [id number] Yes Inject 40 Units into the skin daily. Rollene Almarie LABOR, MD  Active   Insulin  Pen Needle (COMFORT EZ PEN NEEDLES) 31G X 5 MM MISC [id number]  Use daily as directed to inject lantus  Rollene Almarie LABOR, MD  Active    Patient not taking:   Discontinued 02/21/20 1415   meloxicam  (MOBIC ) 15 MG tablet [id number]  Take 1 tablet (15 mg total) by mouth daily. Rollene Almarie LABOR, MD  Active Self  metFORMIN  (GLUCOPHAGE -XR) 500 MG 24 hr tablet [id number] Yes Take 1 tablet (500 mg total) by mouth 2 (two) times daily with a meal. Rollene Almarie LABOR, MD  Active   montelukast  (SINGULAIR ) 10 MG tablet [id number]  Take 1 tablet (10 mg total) by mouth at bedtime. Rollene Almarie LABOR, MD  Active Self  Multiple Vitamins-Minerals (CENTRUM ULTRA WOMENS) TABS [id number]  Take 1 tablet by mouth daily with breakfast.  [provider]  Active Self  OXYGEN  [id number]  Inhale 3-5 L/min into the lungs continuous. [provider]  Active Self  Respiratory Therapy Supplies (NEBULIZER/TUBING/MOUTHPIECE) KIT [id number]  Use with nebulizer Rollene Almarie LABOR, MD  Active   valsartan  (DIOVAN ) 80 MG tablet [id number]  Take 1 tablet (80 mg total) by mouth daily.  Patient taking differently: Take 80 mg by mouth at bedtime.   Rollene Almarie LABOR, MD  Active Self            Assessment/Plan:   Diabetes: - Currently uncontrolled, A1c goal <7.5%. A1c has improved however BG have increased since d/c glimepiride  and starting glipizide  2.5 mg - Decrease Lantus  to 35 units daily due to fasting hypoglycemia - Continue metformin  500 mg BID, and Jardiance  25 mg daily - Increase glipizide  to 5 mg 1 tablet twice daily with food -  Continue to check BG with Dexcom  Follow Up Plan: 7/22  Darrelyn Drum, PharmD, BCPS, CPP Clinical Pharmacist Practitioner [facility] [phone]

## 2024-03-09 ENCOUNTER — Other Ambulatory Visit: Payer: Self-pay

## 2024-03-09 ENCOUNTER — Other Ambulatory Visit (HOSPITAL_COMMUNITY): Payer: Self-pay

## 2024-03-10 ENCOUNTER — Other Ambulatory Visit: Payer: Self-pay

## 2024-03-10 ENCOUNTER — Other Ambulatory Visit (HOSPITAL_COMMUNITY): Payer: Self-pay

## 2024-03-16 ENCOUNTER — Telehealth: Payer: Self-pay | Admitting: Pharmacy Technician

## 2024-03-16 NOTE — Progress Notes (Signed)
   03/16/2024 Name: Yvonne Todd MRN: [id number] DOB: [birth year]  Patient is appearing on a report for [facility] [place] Metric Diabetes and last engaged with the clinical pharmacist to discuss diabetes on 03/08/2024. Contacted patient today to discuss diabetes management and completed medication review.   Diabetes Plan from last clinical pharmacist appointment:  Diabetes: - Currently uncontrolled, A1c goal <7.5%. A1c has improved however BG have increased since d/c glimepiride  and starting glipizide  2.5 mg - Decrease Lantus  to 35 units daily due to fasting hypoglycemia - Continue metformin  500 mg BID, and Jardiance  25 mg daily - Increase glipizide  to 5 mg 1 tablet twice daily with food - Continue to check BG with Dexcom -Follow Up Plan: 7/22(    Medication Adherence Barriers Identified:  Patient made recommended medication changes per plan: Yes Patient informs she decreased Lantus  to 35 units daily and increased Glipizide  to 5mg  twice a day. She informs she is also taking Metformin  500mg  twice a day and Jardiance  25mg  daily. Access issues with any new medication or testing device: No Per Dr Annemarie, Lantus  last filled on 7/9 for42 day supply, Glipizide  on 6/25 for 45 day supply, Metformin  on 6/25 for 30 days supply and Jardiance  on 5/28 for 90 day supply.  Patient is checking blood sugars as prescribed: Yes Patient uses CGM to check her blood sugar and reports blood sugars are all over te place. She informs she has not changed her diet or times of eating. She reports eating hamburger, chicken, vegetables and fruits like oranges, banana, apples (sometimes), strawberry, blueberries and blackberries (when in season). She does ont pair the fruit with protein as she usually eats it as a dessert after a meal or as a midnight snack. She informs her blood sugars typically rise after she eats. She is able to get her blood sugar to come down after eating some protein such as peanuts, peanut butter or a Boos protein  shake.  She informs the low blood sugars in the 60s have gotten better since last visit. She informs the lowest has been 89 but the highest has been 285. She reports this morning her blood sugar is 170.   Reviewed instructions for monitoring blood sugars at home and reminded patient to keep a written log to review with pharmacist Reminded patient of date/time of upcoming clinical pharmacist follow up and any upcoming PCP/specialists visits. Patient denies transportation barriers to the appointment. Yes   Taniqua Issa, CPhT [facility] Office: [phone] Email: [email or web address]  Next clinical pharmacist appointment is scheduled for: 03/22/2024

## 2024-03-19 ENCOUNTER — Other Ambulatory Visit (HOSPITAL_COMMUNITY): Payer: Self-pay

## 2024-03-21 ENCOUNTER — Other Ambulatory Visit: Payer: Self-pay

## 2024-03-22 ENCOUNTER — Other Ambulatory Visit (HOSPITAL_COMMUNITY): Payer: Self-pay

## 2024-03-22 ENCOUNTER — Other Ambulatory Visit (INDEPENDENT_AMBULATORY_CARE_PROVIDER_SITE_OTHER): Admitting: Pharmacist

## 2024-03-22 DIAGNOSIS — E118 Type 2 diabetes mellitus with unspecified complications: Secondary | ICD-10-CM

## 2024-03-22 MED ORDER — TIRZEPATIDE 2.5 MG/0.5ML ~~LOC~~ SOAJ
2.5000 mg | SUBCUTANEOUS | 0 refills | Status: DC
  Filled 2024-03-22: qty 2, 28d supply, fill #0

## 2024-03-22 NOTE — Progress Notes (Signed)
 03/22/2024 Name: Yvonne Todd MRN: [id number] DOB: 10-11-1950  Chief Complaint  Patient presents with   Diabetes   Medication Management    Yvonne Todd is a 73 y.o. year old female who presented for a telephone visit with her sister.   They were referred to the pharmacist by their PCP for assistance in managing diabetes.   Subjective:  Care Team: Primary Care Provider: Rollene Almarie LABOR, MD ; Next Scheduled Visit: 02/26/24  Medication Access/Adherence  Current Pharmacy:  DARRYLE LAW - [facility] [address] [place] [address] Phone: [phone] Fax: [phone]  Patient reports affordability concerns with their medications: No  Patient reports access/transportation concerns to their pharmacy: No  Patient reports adherence concerns with their medications:  No    Diabetes:  Current medications: Jardiance  25 mg daily, glipizide  5 mg 1/2 tab twice daily with food, Lantus  40 units daily at bedtime, metformin  XR 500 mg BID Medications tried in the past: Rybelsus  (pt thinks it was bad stomach upset & broke out in rash)  Uses Dexcom G7 Receiver 14 day report Average Glucose 218 GMI 8.5% Time in Range 35% very high 30% high 35% in range 0% low 0% very low  Previous appt: 14 day report Average Glucose 193 GMI 7.9% Time in Range 18% very high 37% high 44% in range 1% low 0% very low *Having lows in the AM/fasting down to 60-90s  Objective:  Lab Results  Component Value Date   HGBA1C 7.6 02/26/2024    Lab Results  Component Value Date   CREATININE 0.66 01/13/2024   BUN 19 01/13/2024   NA 140 01/13/2024   K 4.2 01/13/2024   CL 103 01/13/2024   CO2 26 01/13/2024    Lab Results  Component Value Date   CHOL 183 06/30/2023   HDL 33.50 (L) 06/30/2023   LDLCALC 111 (H) 02/18/2021   LDLDIRECT 74.0 06/30/2023   TRIG (H) 06/30/2023    467.0 Triglyceride is over 400; calculations on Lipids are invalid.   CHOLHDL 5  06/30/2023    Medications Reviewed Today     Reviewed by Merceda Lela SAUNDERS, [facility] (Pharmacist) on 03/22/24 at 1430  Med List Status: <None>   Medication Order Taking? Sig Documenting Provider Last Dose Status Informant  albuterol  (PROVENTIL ) (2.5 MG/3ML) 0.083% nebulizer solution [id number]  Inhale 3 mLs (2.5 mg total) by nebulization 3 (three) times daily. Rollene Almarie LABOR, MD  Active   albuterol  (VENTOLIN  HFA) 108 [phone] Base) MCG/ACT inhaler [id number]  Inhale 1-2 puffs into the lungs every 4 (four) hours as needed for wheezing or shortness of breath. Rollene Almarie LABOR, MD  Active Self  aspirin  EC 81 MG tablet [id number]  Take 1 tablet (81 mg total) by mouth daily. Pearlean Manus, MD  Active Self  atorvastatin  (LIPITOR) 80 MG tablet [id number]  Take 1 tablet (80 mg total) by mouth at bedtime. Rollene Almarie LABOR, MD  Active Self  blood glucose meter kit and supplies [id number]  Dispense based on patient and insurance preference. Use up to four times daily as directed. (FOR ICD-10 E10.9, E11.9). Rollene Almarie LABOR, MD  Active    Patient not taking:   Discontinued 02/21/20 1415   co-enzyme Q-10 30 MG capsule [id number]  Take 30 mg by mouth daily. [provider]  Active Self  Continuous Glucose Receiver WILMOT G7 [place]) DEVI [id number]  To use with G7 sensors Rollene Almarie LABOR, MD  Active   Continuous Glucose Sensor (DEXCOM G7 SENSOR)  MISC [id number]  Apply sensor every 10 days Rollene Almarie LABOR, MD  Active Self  diphenhydrAMINE  (BENADRYL ) 25 MG tablet [id number]  Take 50 mg by mouth at bedtime. [provider]  Active Self  diphenhydramine -acetaminophen  (TYLENOL  PM) 25-500 MG TABS tablet [id number]  Take 2 tablets by mouth at bedtime as needed (for pain or sleep). [provider]  Active Self  empagliflozin  (JARDIANCE ) 25 MG TABS tablet [id number] Yes Take 1 tablet (25 mg total) by mouth daily. Rollene Almarie LABOR, MD  Active Self  fluticasone   (FLONASE ) 50 MCG/ACT nasal spray [id number]  Place 2 sprays into both nostrils daily. Rollene Almarie LABOR, MD  Active   Fluticasone -Umeclidin-Vilant (TRELEGY ELLIPTA ) 100-62.5-25 MCG/ACT AEPB [id number]  Inhale 1 puff into the lungs daily. Rollene Almarie LABOR, MD  Active   furosemide  (LASIX ) 20 MG tablet [id number]  Take 1-2 tablets (20-40 mg total) by mouth daily.  Patient taking differently: Take 20 mg by mouth See admin instructions. Take 20 mg by mouth in the morning and at 12 CURLY Rollene, Almarie LABOR, MD  Active Self  gabapentin  (NEURONTIN ) 300 MG capsule [id number]  Take 1 capsule (300 mg total) by mouth 3 (three) times daily. Rollene Almarie LABOR, MD  Active Self  glipiZIDE  (GLUCOTROL ) 5 MG tablet [id number] Yes Take 1 tablet (5 mg total) by mouth 2 (two) times daily before a meal. Rollene Almarie LABOR, MD  Active   insulin  glargine (LANTUS  SOLOSTAR) 100 UNIT/ML Solostar Pen [id number] Yes Inject 35 Units into the skin daily. Rollene Almarie LABOR, MD  Active   Insulin  Pen Needle (COMFORT EZ PEN NEEDLES) 31G X 5 MM MISC [id number]  Use daily as directed to inject lantus  Rollene Almarie LABOR, MD  Active    Patient not taking:   Discontinued 02/21/20 1415   meloxicam  (MOBIC ) 15 MG tablet [id number]  Take 1 tablet (15 mg total) by mouth daily. Rollene Almarie LABOR, MD  Active Self  metFORMIN  (GLUCOPHAGE -XR) 500 MG 24 hr tablet [id number] Yes Take 1 tablet (500 mg total) by mouth 2 (two) times daily with a meal. Rollene Almarie LABOR, MD  Active   montelukast  (SINGULAIR ) 10 MG tablet [id number]  Take 1 tablet (10 mg total) by mouth at bedtime. Rollene Almarie LABOR, MD  Active Self  Multiple Vitamins-Minerals (CENTRUM ULTRA WOMENS) TABS [id number]  Take 1 tablet by mouth daily with breakfast. [provider]  Active Self  OXYGEN  [id number]  Inhale 3-5 L/min into the lungs continuous. [provider]  Active Self  Respiratory Therapy Supplies (NEBULIZER/TUBING/MOUTHPIECE) KIT  [id number]  Use with nebulizer Rollene Almarie LABOR, MD  Active   valsartan  (DIOVAN ) 80 MG tablet [id number]  Take 1 tablet (80 mg total) by mouth daily.  Patient taking differently: Take 80 mg by mouth at bedtime.   Rollene Almarie LABOR, MD  Active Self            Assessment/Plan:   Diabetes: - Currently uncontrolled, A1c goal <7.5%. A1c has improved however Dexcom is showing increased BG since decreasing Lantus , while hypoglycemia has improved - Continue Lantus  to 35 units daily due to fasting hypoglycemia - Continue metformin  500 mg BID, Jardiance  25 mg daily, and glipizide  to 5 mg 1 tablet twice daily with food - Start Mounjaro  2.5 mg weekly x 4 weeks, anticipate needing to d/c glipizide  and reduce Lantus  if tolerates Mounjaro  titration - Continue to check BG with Dexcom  Follow Up Plan: 8/12  Darrelyn Drum, PharmD, BCPS, CPP Clinical Pharmacist Practitioner [facility]  at [facility] [phone]

## 2024-03-23 ENCOUNTER — Other Ambulatory Visit (HOSPITAL_COMMUNITY): Payer: Self-pay

## 2024-04-04 DIAGNOSIS — J9611 Chronic respiratory failure with hypoxia: Secondary | ICD-10-CM | POA: Diagnosis not present

## 2024-04-04 DIAGNOSIS — J449 Chronic obstructive pulmonary disease, unspecified: Secondary | ICD-10-CM | POA: Diagnosis not present

## 2024-04-07 ENCOUNTER — Encounter: Payer: Self-pay | Admitting: Podiatry

## 2024-04-07 ENCOUNTER — Ambulatory Visit (INDEPENDENT_AMBULATORY_CARE_PROVIDER_SITE_OTHER): Admitting: Podiatry

## 2024-04-07 DIAGNOSIS — I739 Peripheral vascular disease, unspecified: Secondary | ICD-10-CM | POA: Diagnosis not present

## 2024-04-07 DIAGNOSIS — M79674 Pain in right toe(s): Secondary | ICD-10-CM

## 2024-04-07 DIAGNOSIS — E118 Type 2 diabetes mellitus with unspecified complications: Secondary | ICD-10-CM

## 2024-04-07 DIAGNOSIS — B351 Tinea unguium: Secondary | ICD-10-CM

## 2024-04-07 DIAGNOSIS — M79675 Pain in left toe(s): Secondary | ICD-10-CM | POA: Diagnosis not present

## 2024-04-07 NOTE — Progress Notes (Signed)
 This patient returns to my office for at risk foot care.  This patient requires this care by a professional since this patient will be at risk due to having diabetes and pvd. This patient is unable to cut nails himself since the patient cannot reach his nails.These nails are painful walking and wearing shoes.  This patient presents for at risk foot care today.  General Appearance  Alert, conversant and in no acute stress.  Vascular  Dorsalis pedis and posterior tibial  pulses are  not palpable  due to swelling. bilaterally.  Capillary return is within normal limits  bilaterally. Temperature is within normal limits  bilaterally.  Neurologic  Senn-Weinstein monofilament wire test within normal limits  bilaterally. Muscle power within normal limits bilaterally.  Nails Thick disfigured discolored nails with subungual debris  from hallux to fifth toes bilaterally. No evidence of bacterial infection or drainage bilaterally.  Orthopedic  No limitations of motion  feet .  No crepitus or effusions noted.  No bony pathology or digital deformities noted.  Skin  normotropic skin with no porokeratosis noted bilaterally.  No signs of infections or ulcers noted.     Onychomycosis  Pain in right toes  Pain in left toes  Consent was obtained for treatment procedures.   Mechanical debridement of nails 1-5  bilaterally performed with a nail nipper.  Filed with dremel without incident.    Return office visit    4  months                  Told patient to return for periodic foot care and evaluation due to potential at risk complications.   Cordella Bold DPM

## 2024-04-12 ENCOUNTER — Other Ambulatory Visit

## 2024-04-15 ENCOUNTER — Other Ambulatory Visit: Payer: Self-pay

## 2024-04-15 ENCOUNTER — Other Ambulatory Visit: Payer: Self-pay | Admitting: Internal Medicine

## 2024-04-15 ENCOUNTER — Other Ambulatory Visit (HOSPITAL_COMMUNITY): Payer: Self-pay

## 2024-04-15 DIAGNOSIS — E118 Type 2 diabetes mellitus with unspecified complications: Secondary | ICD-10-CM

## 2024-04-15 MED ORDER — GLIPIZIDE 5 MG PO TABS
5.0000 mg | ORAL_TABLET | Freq: Two times a day (BID) | ORAL | 0 refills | Status: DC
  Filled 2024-04-15: qty 60, 30d supply, fill #0

## 2024-04-16 ENCOUNTER — Other Ambulatory Visit (HOSPITAL_COMMUNITY): Payer: Self-pay

## 2024-04-18 ENCOUNTER — Other Ambulatory Visit: Payer: Self-pay | Admitting: Internal Medicine

## 2024-04-18 DIAGNOSIS — Z1231 Encounter for screening mammogram for malignant neoplasm of breast: Secondary | ICD-10-CM

## 2024-04-21 ENCOUNTER — Other Ambulatory Visit: Payer: Self-pay

## 2024-04-21 ENCOUNTER — Other Ambulatory Visit: Payer: Self-pay | Admitting: Internal Medicine

## 2024-04-21 ENCOUNTER — Other Ambulatory Visit (HOSPITAL_COMMUNITY): Payer: Self-pay

## 2024-04-21 DIAGNOSIS — J9601 Acute respiratory failure with hypoxia: Secondary | ICD-10-CM

## 2024-04-21 DIAGNOSIS — E118 Type 2 diabetes mellitus with unspecified complications: Secondary | ICD-10-CM

## 2024-04-21 MED ORDER — ALBUTEROL SULFATE HFA 108 (90 BASE) MCG/ACT IN AERS
1.0000 | INHALATION_SPRAY | RESPIRATORY_TRACT | 3 refills | Status: AC | PRN
  Filled 2024-04-21: qty 6.7, 17d supply, fill #0
  Filled 2024-06-14: qty 6.7, 17d supply, fill #1

## 2024-04-22 ENCOUNTER — Other Ambulatory Visit (HOSPITAL_COMMUNITY): Payer: Self-pay

## 2024-04-22 MED ORDER — MOUNJARO 2.5 MG/0.5ML ~~LOC~~ SOAJ
2.5000 mg | SUBCUTANEOUS | 0 refills | Status: DC
Start: 2024-04-22 — End: 2024-04-29
  Filled 2024-04-22: qty 2, 28d supply, fill #0

## 2024-04-25 ENCOUNTER — Other Ambulatory Visit: Payer: Self-pay

## 2024-04-26 ENCOUNTER — Other Ambulatory Visit: Payer: Self-pay | Admitting: Internal Medicine

## 2024-04-26 DIAGNOSIS — E118 Type 2 diabetes mellitus with unspecified complications: Secondary | ICD-10-CM

## 2024-04-27 ENCOUNTER — Other Ambulatory Visit: Payer: Self-pay

## 2024-04-27 ENCOUNTER — Other Ambulatory Visit (HOSPITAL_COMMUNITY): Payer: Self-pay

## 2024-04-27 MED ORDER — EMPAGLIFLOZIN 25 MG PO TABS
25.0000 mg | ORAL_TABLET | Freq: Every day | ORAL | 3 refills | Status: AC
  Filled 2024-04-27: qty 90, 90d supply, fill #0
  Filled 2024-08-26 – 2024-08-31 (×4): qty 90, 90d supply, fill #1

## 2024-04-27 MED ORDER — MELOXICAM 15 MG PO TABS
15.0000 mg | ORAL_TABLET | Freq: Every day | ORAL | 0 refills | Status: DC
  Filled 2024-04-27: qty 90, 90d supply, fill #0

## 2024-04-27 MED ORDER — INSUPEN PEN NEEDLES 31G X 5 MM MISC
3 refills | Status: AC
  Filled 2024-04-27: qty 100, 100d supply, fill #0
  Filled 2024-08-26 – 2024-08-31 (×4): qty 100, 100d supply, fill #1

## 2024-04-27 MED ORDER — METFORMIN HCL ER 500 MG PO TB24
500.0000 mg | ORAL_TABLET | Freq: Two times a day (BID) | ORAL | 0 refills | Status: DC
  Filled 2024-04-27: qty 90, 45d supply, fill #0

## 2024-04-28 ENCOUNTER — Other Ambulatory Visit: Payer: Self-pay | Admitting: Internal Medicine

## 2024-04-28 ENCOUNTER — Other Ambulatory Visit (HOSPITAL_COMMUNITY): Payer: Self-pay

## 2024-04-28 ENCOUNTER — Other Ambulatory Visit: Payer: Self-pay

## 2024-04-28 MED ORDER — ATORVASTATIN CALCIUM 80 MG PO TABS
80.0000 mg | ORAL_TABLET | Freq: Every day | ORAL | 0 refills | Status: DC
  Filled 2024-04-28: qty 90, 90d supply, fill #0

## 2024-04-28 MED ORDER — GABAPENTIN 300 MG PO CAPS
300.0000 mg | ORAL_CAPSULE | Freq: Three times a day (TID) | ORAL | 0 refills | Status: DC
  Filled 2024-04-28 – 2024-04-30 (×2): qty 270, 90d supply, fill #0

## 2024-04-29 ENCOUNTER — Other Ambulatory Visit (HOSPITAL_COMMUNITY): Payer: Self-pay

## 2024-04-29 ENCOUNTER — Other Ambulatory Visit: Admitting: Pharmacist

## 2024-04-29 DIAGNOSIS — Z7985 Long-term (current) use of injectable non-insulin antidiabetic drugs: Secondary | ICD-10-CM

## 2024-04-29 DIAGNOSIS — Z7984 Long term (current) use of oral hypoglycemic drugs: Secondary | ICD-10-CM

## 2024-04-29 DIAGNOSIS — E118 Type 2 diabetes mellitus with unspecified complications: Secondary | ICD-10-CM

## 2024-04-29 DIAGNOSIS — Z794 Long term (current) use of insulin: Secondary | ICD-10-CM

## 2024-04-29 MED ORDER — TIRZEPATIDE 5 MG/0.5ML ~~LOC~~ SOAJ
5.0000 mg | SUBCUTANEOUS | 1 refills | Status: DC
  Filled 2024-04-29: qty 2, 28d supply, fill #0
  Filled 2024-05-28: qty 2, 28d supply, fill #1

## 2024-04-29 MED ORDER — LANTUS SOLOSTAR 100 UNIT/ML ~~LOC~~ SOPN
25.0000 [IU] | PEN_INJECTOR | Freq: Every day | SUBCUTANEOUS | 1 refills | Status: AC
  Filled 2024-04-29 – 2024-05-17 (×4): qty 15, 60d supply, fill #0
  Filled 2024-08-26 – 2024-09-20 (×4): qty 15, 60d supply, fill #1

## 2024-04-29 NOTE — Patient Instructions (Signed)
 It was a pleasure speaking with you today!  - Decrease Lantus  to 25 units daily - Continue metformin  500 mg twice daily and Jardiance  25 mg daily - Increase Mounjaro  5 mg weekly and stop glipizide  when starting increased Mounjaro   Feel free to call with any questions or concerns!  Darrelyn Drum, PharmD, BCPS, CPP Clinical Pharmacist Practitioner [facility] [phone]

## 2024-04-29 NOTE — Progress Notes (Signed)
 04/29/2024 Name: Yvonne Todd MRN: [id number] DOB: 02-10-1951  Chief Complaint  Patient presents with   Diabetes   Medication Management    Yvonne Todd is a 73 y.o. year old female who presented for a telephone visit with her sister.   They were referred to the pharmacist by their PCP for assistance in managing diabetes.   Subjective:  Care Team: Primary Care Provider: Rollene Almarie LABOR, MD ; Next Scheduled Visit: none scheduled  Medication Access/Adherence  Current Pharmacy:  DARRYLE LONG - [facility] [address] [place] [address] Phone: [phone] Fax: [phone]  Patient reports affordability concerns with their medications: No  Patient reports access/transportation concerns to their pharmacy: No  Patient reports adherence concerns with their medications:  No    Diabetes:  Current medications: Jardiance  25 mg daily, glipizide  5 mg 1/2 tab twice daily with food, Lantus  35 units daily at bedtime, metformin  XR 500 mg BID, Mounjaro  2.5 mg weekly (started 5 weeks ago) Medications tried in the past: Rybelsus  (pt thinks it was bad stomach upset & broke out in rash)  She reports vomitting 2x the day she started Mounjaro  2.5 mg and since then has had no N/V  Uses Dexcom G7 Receiver 14 day report Average Glucose 171 GMI 7.4% Time in Range 10% very high 30% high 59% in range 1% low 0% very low **BG 60-80 in the AM, down to 40 one time  Previous (prior to Mounjaro ): 14 day report Average Glucose 218 GMI 8.5% Time in Range 35% very high 30% high 35% in range 0% low 0% very low  Objective:  Lab Results  Component Value Date   HGBA1C 7.6 02/26/2024    Lab Results  Component Value Date   CREATININE 0.66 01/13/2024   BUN 19 01/13/2024   NA 140 01/13/2024   K 4.2 01/13/2024   CL 103 01/13/2024   CO2 26 01/13/2024    Lab Results  Component Value Date   CHOL 183 06/30/2023   HDL 33.50 (L) 06/30/2023   LDLCALC 111  (H) 02/18/2021   LDLDIRECT 74.0 06/30/2023   TRIG (H) 06/30/2023    467.0 Triglyceride is over 400; calculations on Lipids are invalid.   CHOLHDL 5 06/30/2023    Medications Reviewed Today     Reviewed by Merceda Lela SAUNDERS, [facility] (Pharmacist) on 04/29/24 at 1629  Med List Status: <None>   Medication Order Taking? Sig Documenting Provider Last Dose Status Informant  albuterol  (PROVENTIL ) (2.5 MG/3ML) 0.083% nebulizer solution [id number]  Inhale 3 mLs (2.5 mg total) by nebulization 3 (three) times daily. Rollene Almarie LABOR, MD  Active   albuterol  (VENTOLIN  HFA) 108 [phone] Base) MCG/ACT inhaler [id number]  Inhale 1-2 puffs into the lungs every 4 (four) hours as needed for wheezing or shortness of breath. Rollene Almarie LABOR, MD  Active   aspirin  EC 81 MG tablet [id number]  Take 1 tablet (81 mg total) by mouth daily. Pearlean Manus, MD  Active Self  atorvastatin  (LIPITOR) 80 MG tablet [id number]  Take 1 tablet (80 mg total) by mouth at bedtime. Rollene Almarie LABOR, MD  Active   blood glucose meter kit and supplies [id number]  Dispense based on patient and insurance preference. Use up to four times daily as directed. (FOR ICD-10 E10.9, E11.9). Rollene Almarie LABOR, MD  Active    Patient not taking:   Discontinued 02/21/20 1415   co-enzyme Q-10 30 MG capsule [id number]  Take 30 mg by mouth daily. [provider]  Active Self  Continuous Glucose Receiver WILMOT G7 [place]) DEVI [id number]  To use with G7 sensors Rollene Almarie LABOR, MD  Active   Continuous Glucose Sensor (DEXCOM G7 [place]) [place] [id number] Yes Apply sensor every 10 days Rollene Almarie LABOR, MD  Active Self  diphenhydrAMINE  (BENADRYL ) 25 MG tablet [id number]  Take 50 mg by mouth at bedtime. [provider]  Active Self  diphenhydramine -acetaminophen  (TYLENOL  PM) 25-500 MG TABS tablet [id number]  Take 2 tablets by mouth at bedtime as needed (for pain or sleep). [provider]  Active Self  empagliflozin   (JARDIANCE ) 25 MG TABS tablet [id number] Yes Take 1 tablet (25 mg total) by mouth daily. Rollene Almarie LABOR, MD  Active   fluticasone  (FLONASE ) 50 MCG/ACT nasal spray [id number]  Place 2 sprays into both nostrils daily. Rollene Almarie LABOR, MD  Active   Fluticasone -Umeclidin-Vilant (TRELEGY ELLIPTA ) 100-62.5-25 MCG/ACT AEPB [id number]  Inhale 1 puff into the lungs daily. Rollene Almarie LABOR, MD  Active   furosemide  (LASIX ) 20 MG tablet [id number]  Take 1-2 tablets (20-40 mg total) by mouth daily.  Patient taking differently: Take 20 mg by mouth See admin instructions. Take 20 mg by mouth in the morning and at 12 CURLY Rollene, Almarie LABOR, MD  Active Self  gabapentin  (NEURONTIN ) 300 MG capsule [id number]  Take 1 capsule (300 mg total) by mouth 3 (three) times daily. Rollene Almarie LABOR, MD  Active   glipiZIDE  (GLUCOTROL ) 5 MG tablet [id number] Yes Take 1 tablet (5 mg total) by mouth 2 (two) times daily before a meal. Rollene Almarie LABOR, MD  Active   insulin  glargine (LANTUS  SOLOSTAR) 100 UNIT/ML Solostar Pen [id number] Yes Inject 35 Units into the skin daily. Rollene Almarie LABOR, MD  Active   Insulin  Pen Needle (INSUPEN PEN NEEDLES) 31G X 5 MM MISC [id number]  Use daily as directed to inject lantus  Rollene Almarie LABOR, MD  Active    Patient not taking:   Discontinued 02/21/20 1415   meloxicam  (MOBIC ) 15 MG tablet [id number]  Take 1 tablet (15 mg total) by mouth daily. Rollene Almarie LABOR, MD  Active   metFORMIN  (GLUCOPHAGE -XR) 500 MG 24 hr tablet [id number] Yes Take 1 tablet (500 mg total) by mouth 2 (two) times daily with a meal. Rollene Almarie LABOR, MD  Active   montelukast  (SINGULAIR ) 10 MG tablet [id number]  Take 1 tablet (10 mg total) by mouth at bedtime. Rollene Almarie LABOR, MD  Active Self  Multiple Vitamins-Minerals (CENTRUM ULTRA WOMENS) TABS [id number]  Take 1 tablet by mouth daily with breakfast. [provider]  Active Self  OXYGEN  [id number]  Inhale 3-5 L/min into the  lungs continuous. [provider]  Active Self  Respiratory Therapy Supplies (NEBULIZER/TUBING/MOUTHPIECE) KIT [id number]  Use with nebulizer Rollene Almarie LABOR, MD  Active   tirzepatide  (MOUNJARO ) 2.5 MG/0.5ML Pen [id number] Yes Inject 2.5 mg into the skin once a week. Geofm Glade PARAS, MD  Active   valsartan  (DIOVAN ) 80 MG tablet [id number]  Take 1 tablet (80 mg total) by mouth daily.  Patient taking differently: Take 80 mg by mouth at bedtime.   Rollene Almarie LABOR, MD  Active Self            Assessment/Plan:   Diabetes: - Currently uncontrolled, A1c goal <7.5%. GMI and average glucose on Dexcom have improved. She is havinh fasting hypoglycemia - Decrease Lantus  to 25 units daily due to fasting hypoglycemia - Continue metformin  500 mg BID, Jardiance  25 mg daily - Increase Mounjaro  5 mg  weekly and stop glipizide  when starting increased Mounjaro  - Continue to check BG with Dexcom  Follow Up Plan: 9/11  Darrelyn Drum, PharmD, BCPS, CPP Clinical Pharmacist Practitioner [facility] [phone]

## 2024-04-30 ENCOUNTER — Other Ambulatory Visit: Payer: Self-pay | Admitting: Internal Medicine

## 2024-04-30 ENCOUNTER — Other Ambulatory Visit (HOSPITAL_COMMUNITY): Payer: Self-pay

## 2024-04-30 DIAGNOSIS — J4489 Other specified chronic obstructive pulmonary disease: Secondary | ICD-10-CM

## 2024-04-30 DIAGNOSIS — J9601 Acute respiratory failure with hypoxia: Secondary | ICD-10-CM

## 2024-05-01 ENCOUNTER — Other Ambulatory Visit: Payer: Self-pay

## 2024-05-03 ENCOUNTER — Other Ambulatory Visit: Payer: Self-pay

## 2024-05-03 ENCOUNTER — Other Ambulatory Visit (HOSPITAL_COMMUNITY): Payer: Self-pay

## 2024-05-03 MED ORDER — VALSARTAN 80 MG PO TABS
80.0000 mg | ORAL_TABLET | Freq: Every day | ORAL | 1 refills | Status: AC
  Filled 2024-05-03: qty 90, 90d supply, fill #0
  Filled 2024-08-26 – 2024-08-31 (×4): qty 90, 90d supply, fill #1

## 2024-05-03 MED ORDER — MONTELUKAST SODIUM 10 MG PO TABS
10.0000 mg | ORAL_TABLET | Freq: Every day | ORAL | 1 refills | Status: AC
  Filled 2024-05-03: qty 90, 90d supply, fill #0
  Filled 2024-07-07 – 2024-08-31 (×5): qty 90, 90d supply, fill #1

## 2024-05-04 ENCOUNTER — Other Ambulatory Visit: Payer: Self-pay

## 2024-05-04 ENCOUNTER — Encounter

## 2024-05-04 DIAGNOSIS — Z1231 Encounter for screening mammogram for malignant neoplasm of breast: Secondary | ICD-10-CM

## 2024-05-05 ENCOUNTER — Ambulatory Visit
Admission: RE | Admit: 2024-05-05 | Discharge: 2024-05-05 | Disposition: A | Discharge status: Home / Self Care | Source: Ambulatory Visit | Attending: Internal Medicine | Admitting: Internal Medicine

## 2024-05-05 DIAGNOSIS — Z1231 Encounter for screening mammogram for malignant neoplasm of breast: Secondary | ICD-10-CM | POA: Diagnosis not present

## 2024-05-05 DIAGNOSIS — J449 Chronic obstructive pulmonary disease, unspecified: Secondary | ICD-10-CM | POA: Diagnosis not present

## 2024-05-11 ENCOUNTER — Ambulatory Visit: Payer: Self-pay | Admitting: Internal Medicine

## 2024-05-11 LAB — HM MAMMOGRAPHY

## 2024-05-12 ENCOUNTER — Other Ambulatory Visit: Admitting: Pharmacist

## 2024-05-12 ENCOUNTER — Other Ambulatory Visit: Payer: Self-pay

## 2024-05-12 ENCOUNTER — Other Ambulatory Visit (HOSPITAL_COMMUNITY): Payer: Self-pay

## 2024-05-12 DIAGNOSIS — E118 Type 2 diabetes mellitus with unspecified complications: Secondary | ICD-10-CM

## 2024-05-12 NOTE — Progress Notes (Signed)
 05/12/2024 Name: Yvonne Todd MRN: [id number] DOB: [birth year]  Chief Complaint  Patient presents with   Diabetes   Medication Management    Yvonne Todd is a 73 y.o. year old female who presented for a telephone visit with her sister.   They were referred to the pharmacist by their PCP for assistance in managing diabetes.   Subjective:  Care Team: Primary Care Provider: Rollene Almarie LABOR, MD ; Next Scheduled Visit: none scheduled  Medication Access/Adherence  Current Pharmacy:  DARRYLE LONG - [facility] [address] [place] [address] Phone: [phone] Fax: [phone]  Patient reports affordability concerns with their medications: No  Patient reports access/transportation concerns to their pharmacy: No  Patient reports adherence concerns with their medications:  No    Diabetes:  Current medications: Jardiance  25 mg daily, Lantus  25 units daily at bedtime, metformin  XR 500 mg BID, Mounjaro  5 mg weekly (started 2 weeks ago) Medications tried in the past: Rybelsus  (pt thinks it was bad stomach upset & broke out in rash), glipizide  5 mg (hypoglycemia), glipizide  2.5 mg (d/c when Mounjaro  increased to avoid hypoglycemia)  She reports some nausea when increased to Mounjaro  5 mg, no vomiting.  Uses Dexcom G7 Receiver 14 day report Average Glucose 188 GMI 7.8% Time in Range 18% very high 32% high 48% in range 1% low 1% very low  Previous (on Mounjaro  2.5 mg): 14 day report Average Glucose 171 GMI 7.4% Time in Range 10% very high 30% high 59% in range 1% low 0% very low **BG 60-80 in the AM, down to 40 one time  Previous (prior to Mounjaro ): 14 day report Average Glucose 218 GMI 8.5% Time in Range 35% very high 30% high 35% in range 0% low 0% very low  Objective:  Lab Results  Component Value Date   HGBA1C 7.6 02/26/2024    Lab Results  Component Value Date   CREATININE 0.66 01/13/2024   BUN 19 01/13/2024    NA 140 01/13/2024   K 4.2 01/13/2024   CL 103 01/13/2024   CO2 26 01/13/2024    Lab Results  Component Value Date   CHOL 183 06/30/2023   HDL 33.50 (L) 06/30/2023   LDLCALC 111 (H) 02/18/2021   LDLDIRECT 74.0 06/30/2023   TRIG (H) 06/30/2023    467.0 Triglyceride is over 400; calculations on Lipids are invalid.   CHOLHDL 5 06/30/2023    Medications Reviewed Today     Reviewed by Merceda Lela SAUNDERS, [facility] (Pharmacist) on 05/12/24 at 2213  Med List Status: <None>   Medication Order Taking? Sig Documenting Provider Last Dose Status Informant  albuterol  (PROVENTIL ) (2.5 MG/3ML) 0.083% nebulizer solution [id number]  Inhale 3 mLs (2.5 mg total) by nebulization 3 (three) times daily. Rollene Almarie LABOR, MD  Active   albuterol  (VENTOLIN  HFA) 108 (90 Base) MCG/ACT inhaler [id number]  Inhale 1-2 puffs into the lungs every 4 (four) hours as needed for wheezing or shortness of breath. Rollene Almarie LABOR, MD  Active   aspirin  EC 81 MG tablet [id number]  Take 1 tablet (81 mg total) by mouth daily. Pearlean Manus, MD  Active Self  atorvastatin  (LIPITOR) 80 MG tablet [id number]  Take 1 tablet (80 mg total) by mouth at bedtime. Rollene Almarie LABOR, MD  Active   blood glucose meter kit and supplies [id number]  Dispense based on patient and insurance preference. Use up to four times daily as directed. (FOR ICD-10 E10.9, E11.9). Rollene Almarie LABOR, MD  Active  Patient not taking:   Discontinued 02/21/20 1415   co-enzyme Q-10 30 MG capsule [id number]  Take 30 mg by mouth daily. [provider]  Active Self  Continuous Glucose Receiver WILMOT G7 [place]) DEVI [id number]  To use with G7 sensors Rollene Almarie LABOR, MD  Active   Continuous Glucose Sensor (DEXCOM G7 [place]) [place] [id number]  Apply sensor every 10 days Rollene Almarie LABOR, MD  Active Self  diphenhydrAMINE  (BENADRYL ) 25 MG tablet [id number]  Take 50 mg by mouth at bedtime. [provider]  Active Self   diphenhydramine -acetaminophen  (TYLENOL  PM) 25-500 MG TABS tablet [id number]  Take 2 tablets by mouth at bedtime as needed (for pain or sleep). [provider]  Active Self  empagliflozin  (JARDIANCE ) 25 MG TABS tablet [id number] Yes Take 1 tablet (25 mg total) by mouth daily. Rollene Almarie LABOR, MD  Active   fluticasone  (FLONASE ) 50 MCG/ACT nasal spray [id number]  Place 2 sprays into both nostrils daily. Rollene Almarie LABOR, MD  Active   Fluticasone -Umeclidin-Vilant (TRELEGY ELLIPTA ) 100-62.5-25 MCG/ACT AEPB [id number]  Inhale 1 puff into the lungs daily. Rollene Almarie LABOR, MD  Active   furosemide  (LASIX ) 20 MG tablet [id number]  Take 1-2 tablets (20-40 mg total) by mouth daily.  Patient taking differently: Take 20 mg by mouth See admin instructions. Take 20 mg by mouth in the morning and at 12 CURLY Rollene, Almarie LABOR, MD  Active Self  gabapentin  (NEURONTIN ) 300 MG capsule [id number]  Take 1 capsule (300 mg total) by mouth 3 (three) times daily. Rollene Almarie LABOR, MD  Active   insulin  glargine (LANTUS  SOLOSTAR) 100 UNIT/ML Solostar Pen [id number] Yes Inject 25 Units into the skin daily. Rollene Almarie LABOR, MD  Active   Insulin  Pen Needle (INSUPEN PEN NEEDLES) 31G X 5 MM MISC [id number]  Use daily as directed to inject lantus  Rollene Almarie LABOR, MD  Active    Patient not taking:   Discontinued 02/21/20 1415   meloxicam  (MOBIC ) 15 MG tablet [id number]  Take 1 tablet (15 mg total) by mouth daily. Rollene Almarie LABOR, MD  Active   metFORMIN  (GLUCOPHAGE -XR) 500 MG 24 hr tablet [id number] Yes Take 1 tablet (500 mg total) by mouth 2 (two) times daily with a meal. Rollene Almarie LABOR, MD  Active   montelukast  (SINGULAIR ) 10 MG tablet [id number]  Take 1 tablet (10 mg total) by mouth at bedtime. Rollene Almarie LABOR, MD  Active   Multiple Vitamins-Minerals (CENTRUM ULTRA WOMENS) TABS [id number]  Take 1 tablet by mouth daily with breakfast. [provider]  Active Self  OXYGEN   [id number]  Inhale 3-5 L/min into the lungs continuous. [provider]  Active Self  Respiratory Therapy Supplies (NEBULIZER/TUBING/MOUTHPIECE) KIT [id number]  Use with nebulizer Rollene Almarie LABOR, MD  Active   tirzepatide  (MOUNJARO ) 5 MG/0.5ML Pen [id number] Yes Inject 5 mg into the skin once a week. Rollene Almarie LABOR, MD  Active   valsartan  (DIOVAN ) 80 MG tablet [id number]  Take 1 tablet (80 mg total) by mouth daily. Rollene Almarie LABOR, MD  Active             Assessment/Plan:   Diabetes: - Currently uncontrolled, A1c goal <7.5%. GMI and average glucose on Dexcom have slightly increased. Still having some hypoglycemia. - Continue Lantus  25 units daily, metformin  500 mg BID, Jardiance  25 mg daily - Continue Mounjaro  5 mg weekly  - Continue to check BG with Dexcom - Will give recent changes more time before considering other medication adjustments  Follow  Up Plan: 9/25  Darrelyn Drum, PharmD, BCPS, CPP Clinical Pharmacist Practitioner [facility] at [facility] [phone]

## 2024-05-17 ENCOUNTER — Other Ambulatory Visit (HOSPITAL_COMMUNITY): Payer: Self-pay

## 2024-05-17 ENCOUNTER — Other Ambulatory Visit: Payer: Self-pay

## 2024-05-17 ENCOUNTER — Telehealth: Payer: Self-pay | Admitting: Internal Medicine

## 2024-05-17 NOTE — Telephone Encounter (Signed)
 Copied from CRM [id number]. Topic: General - Other >> May 16, 2024  3:57 PM [name] wrote: Reason for CRM: Pt called for prescription for Covid, flu, and any others vaccines.  [facility] [address] [place], [place], [place] [address] ph: [phone]

## 2024-05-17 NOTE — Telephone Encounter (Signed)
 Called patietn and informed her that she does not need a prescription for her vaccines including the Covid vaccine

## 2024-05-26 ENCOUNTER — Other Ambulatory Visit: Admitting: Pharmacist

## 2024-05-26 ENCOUNTER — Other Ambulatory Visit (HOSPITAL_COMMUNITY): Payer: Self-pay

## 2024-05-26 ENCOUNTER — Ambulatory Visit: Admitting: Physician Assistant

## 2024-05-26 ENCOUNTER — Ambulatory Visit (HOSPITAL_COMMUNITY)
Admission: RE | Admit: 2024-05-26 | Discharge: 2024-05-26 | Disposition: A | Discharge status: Home / Self Care | Source: Ambulatory Visit | Attending: Physician Assistant | Admitting: Physician Assistant

## 2024-05-26 VITALS — BP 141/87 | HR 86 | Temp 98.5°F | Wt 198.1 lb

## 2024-05-26 DIAGNOSIS — I739 Peripheral vascular disease, unspecified: Secondary | ICD-10-CM | POA: Insufficient documentation

## 2024-05-26 DIAGNOSIS — E118 Type 2 diabetes mellitus with unspecified complications: Secondary | ICD-10-CM

## 2024-05-26 DIAGNOSIS — Z7985 Long-term (current) use of injectable non-insulin antidiabetic drugs: Secondary | ICD-10-CM

## 2024-05-26 DIAGNOSIS — Z7984 Long term (current) use of oral hypoglycemic drugs: Secondary | ICD-10-CM

## 2024-05-26 LAB — VAS US ABI WITH/WO TBI
Left ABI: 0.6
Right ABI: 0.62

## 2024-05-26 MED ORDER — GLIPIZIDE 5 MG PO TABS
2.5000 mg | ORAL_TABLET | Freq: Two times a day (BID) | ORAL | 0 refills | Status: DC
  Filled 2024-05-26 – 2024-05-28 (×2): qty 90, 90d supply, fill #0

## 2024-05-26 NOTE — Patient Instructions (Signed)
 It was a pleasure speaking with you today!  Restart glipizide  5 mg 1/2 tablet twice daily with food. Continue monitoring blood sugars.  Next call will be 10/9.  Feel free to call with any questions or concerns!  Darrelyn Drum, PharmD, BCPS, CPP Clinical Pharmacist Practitioner [facility] at [facility] [phone]

## 2024-05-26 NOTE — Progress Notes (Signed)
 05/26/2024 Name: Yvonne Todd MRN: [id number] DOB: 1950-12-07  Chief Complaint  Patient presents with   Diabetes   Medication Management    Yvonne Todd is a 73 y.o. year old female who presented for a telephone visit with her sister.   They were referred to the pharmacist by their PCP for assistance in managing diabetes.   Subjective:  Care Team: Primary Care Provider: Rollene Almarie LABOR, MD ; Next Scheduled Visit: none scheduled  Medication Access/Adherence  Current Pharmacy:  DARRYLE LONG - [facility] [address] [place] [address] Phone: [phone] Fax: [phone]  Patient reports affordability concerns with their medications: No  Patient reports access/transportation concerns to their pharmacy: No  Patient reports adherence concerns with their medications:  No    Diabetes:  Current medications: Jardiance  25 mg daily, Lantus  25 units daily at bedtime, metformin  XR 500 mg BID, Mounjaro  5 mg weekly (started 4 weeks ago) Medications tried in the past: Rybelsus  (pt thinks it was bad stomach upset & broke out in rash), glipizide  5 mg (hypoglycemia), glipizide  2.5 mg (d/c when Mounjaro  increased to avoid hypoglycemia)  She reported some nausea when increased to Mounjaro  5 mg, no vomiting.  Uses Dexcom G7 Receiver 14 day report Average Glucose 221 GMI 8.6% Time in Range 29% very high 42% high 29% in range 0% low 0% very low  Previous 14 day report (on Mounjaro  5 mg, d/c'd glipizide  2.5 mg) Average Glucose 188 GMI 7.8% Time in Range 18% very high 32% high 48% in range 1% low 1% very low  Previous (on Mounjaro  2.5 mg): 14 day report Average Glucose 171 GMI 7.4% Time in Range 10% very high 30% high 59% in range 1% low 0% very low **BG 60-80 in the AM, down to 40 one time  Unsweet tea, coffee with creamer, no sugar Sandwiches at night, soups, brussel sprouts  Objective:  Lab Results  Component Value Date    HGBA1C 7.6 02/26/2024    Lab Results  Component Value Date   CREATININE 0.66 01/13/2024   BUN 19 01/13/2024   NA 140 01/13/2024   K 4.2 01/13/2024   CL 103 01/13/2024   CO2 26 01/13/2024    Lab Results  Component Value Date   CHOL 183 06/30/2023   HDL 33.50 (L) 06/30/2023   LDLCALC 111 (H) 02/18/2021   LDLDIRECT 74.0 06/30/2023   TRIG (H) 06/30/2023    467.0 Triglyceride is over 400; calculations on Lipids are invalid.   CHOLHDL 5 06/30/2023    Medications Reviewed Today     Reviewed by Merceda Lela SAUNDERS, [facility] (Pharmacist) on 05/26/24 at 1102  Med List Status: <None>   Medication Order Taking? Sig Documenting Provider Last Dose Status Informant  albuterol  (PROVENTIL ) (2.5 MG/3ML) 0.083% nebulizer solution [id number]  Inhale 3 mLs (2.5 mg total) by nebulization 3 (three) times daily. Rollene Almarie LABOR, MD  Active   albuterol  (VENTOLIN  HFA) 108 [phone] Base) MCG/ACT inhaler [id number]  Inhale 1-2 puffs into the lungs every 4 (four) hours as needed for wheezing or shortness of breath. Rollene Almarie LABOR, MD  Active   aspirin  EC 81 MG tablet [id number]  Take 1 tablet (81 mg total) by mouth daily. Pearlean Manus, MD  Active Self  atorvastatin  (LIPITOR) 80 MG tablet [id number]  Take 1 tablet (80 mg total) by mouth at bedtime. Rollene Almarie LABOR, MD  Active   blood glucose meter kit and supplies [id number]  Dispense based on patient and insurance  preference. Use up to four times daily as directed. (FOR ICD-10 E10.9, E11.9). Rollene Almarie LABOR, MD  Active    Patient not taking:   Discontinued 02/21/20 1415   co-enzyme Q-10 30 MG capsule [id number]  Take 30 mg by mouth daily. [provider]  Active Self  Continuous Glucose Receiver WILMOT G7 [place]) DEVI [id number]  To use with G7 sensors Rollene Almarie LABOR, MD  Active   Continuous Glucose Sensor (DEXCOM G7 [place]) [place] [id number] Yes Apply sensor every 10 days Rollene Almarie LABOR, MD  Active Self  diphenhydrAMINE   (BENADRYL ) 25 MG tablet [id number]  Take 50 mg by mouth at bedtime. [provider]  Active Self  diphenhydramine -acetaminophen  (TYLENOL  PM) 25-500 MG TABS tablet [id number]  Take 2 tablets by mouth at bedtime as needed (for pain or sleep). [provider]  Active Self  empagliflozin  (JARDIANCE ) 25 MG TABS tablet [id number] Yes Take 1 tablet (25 mg total) by mouth daily. Rollene Almarie LABOR, MD  Active   fluticasone  (FLONASE ) 50 MCG/ACT nasal spray [id number]  Place 2 sprays into both nostrils daily. Rollene Almarie LABOR, MD  Active   Fluticasone -Umeclidin-Vilant (TRELEGY ELLIPTA ) 100-62.5-25 MCG/ACT AEPB [id number]  Inhale 1 puff into the lungs daily. Rollene Almarie LABOR, MD  Active   furosemide  (LASIX ) 20 MG tablet [id number]  Take 1-2 tablets (20-40 mg total) by mouth daily.  Patient taking differently: Take 20 mg by mouth See admin instructions. Take 20 mg by mouth in the morning and at 12 CURLY Rollene, Almarie LABOR, MD  Active Self  gabapentin  (NEURONTIN ) 300 MG capsule [id number]  Take 1 capsule (300 mg total) by mouth 3 (three) times daily. Rollene Almarie LABOR, MD  Active   insulin  glargine (LANTUS  SOLOSTAR) 100 UNIT/ML Solostar Pen [id number] Yes Inject 25 Units into the skin daily. Rollene Almarie LABOR, MD  Active   Insulin  Pen Needle (INSUPEN PEN NEEDLES) 31G X 5 MM MISC [id number]  Use daily as directed to inject lantus  Rollene Almarie LABOR, MD  Active    Patient not taking:   Discontinued 02/21/20 1415   meloxicam  (MOBIC ) 15 MG tablet [id number]  Take 1 tablet (15 mg total) by mouth daily. Rollene Almarie LABOR, MD  Active   metFORMIN  (GLUCOPHAGE -XR) 500 MG 24 hr tablet [id number] Yes Take 1 tablet (500 mg total) by mouth 2 (two) times daily with a meal. Rollene Almarie LABOR, MD  Active   montelukast  (SINGULAIR ) 10 MG tablet [id number]  Take 1 tablet (10 mg total) by mouth at bedtime. Rollene Almarie LABOR, MD  Active   Multiple Vitamins-Minerals (CENTRUM ULTRA WOMENS)  TABS [id number]  Take 1 tablet by mouth daily with breakfast. [provider]  Active Self  OXYGEN  [id number]  Inhale 3-5 L/min into the lungs continuous. [provider]  Active Self  Respiratory Therapy Supplies (NEBULIZER/TUBING/MOUTHPIECE) KIT [id number]  Use with nebulizer Rollene Almarie LABOR, MD  Active   tirzepatide  (MOUNJARO ) 5 MG/0.5ML Pen [id number] Yes Inject 5 mg into the skin once a week. Rollene Almarie LABOR, MD  Active   valsartan  (DIOVAN ) 80 MG tablet [id number]  Take 1 tablet (80 mg total) by mouth daily. Rollene Almarie LABOR, MD  Active             Assessment/Plan:   Diabetes: - Currently uncontrolled, A1c goal <7.5%. GMI and average glucose on Dexcom have slightly increased. Still having some hypoglycemia. - Continue Lantus  25 units daily, metformin  500 mg BID, Jardiance  25 mg daily - Continue Mounjaro  5 mg weekly  -  Restart glipizide  5 mg 1/2 tablet twice daily with food, BG were lower prior to glipizide  discontinuation - Continue to check BG with Dexcom   Follow Up Plan: 10/9  Darrelyn Drum, PharmD, BCPS, CPP Clinical Pharmacist Practitioner [facility] at [facility] [phone]

## 2024-05-26 NOTE — Progress Notes (Signed)
 Office Note   History of Present Illness   Yvonne Todd is a 73 y.o. (Jan 10, 1951) female who presents for surveillance of PAD and venous insufficiency.  She has no prior history of vascular interventions.  She has a history of stable, bilateral calf claudication.   She returns today for follow-up.  She has no complaints at today's office visit.  She says that her claudication remains about the same.  She says that she can walk for about an hour before her calves cramp up.  This is not limiting to her mobility at all.  Overall her walking is more limited by her COPD.  She remains on 3L [place] at baseline.  Current Outpatient Medications  Medication Sig Dispense Refill   albuterol  (PROVENTIL ) (2.5 MG/3ML) 0.083% nebulizer solution Inhale 3 mLs (2.5 mg total) by nebulization 3 (three) times daily. 75 mL 2   albuterol  (VENTOLIN  HFA) 108 (90 Base) MCG/ACT inhaler Inhale 1-2 puffs into the lungs every 4 (four) hours as needed for wheezing or shortness of breath. 6.7 g 3   aspirin  EC 81 MG tablet Take 1 tablet (81 mg total) by mouth daily. (Patient not taking: Reported on 05/26/2024) 30 tablet 3   atorvastatin  (LIPITOR) 80 MG tablet Take 1 tablet (80 mg total) by mouth at bedtime. 90 tablet 0   blood glucose meter kit and supplies Dispense based on patient and insurance preference. Use up to four times daily as directed. (FOR ICD-10 E10.9, E11.9). 1 each 0   co-enzyme Q-10 30 MG capsule Take 30 mg by mouth daily.     Continuous Glucose Receiver (DEXCOM G7 RECEIVER) DEVI To use with G7 sensors 1 each 0   Continuous Glucose Sensor (DEXCOM G7 SENSOR) MISC Apply sensor every 10 days 3 each 11   diphenhydrAMINE  (BENADRYL ) 25 MG tablet Take 50 mg by mouth at bedtime.     diphenhydramine -acetaminophen  (TYLENOL  PM) 25-500 MG TABS tablet Take 2 tablets by mouth at bedtime as needed (for pain or sleep).     empagliflozin  (JARDIANCE ) 25 MG TABS tablet Take 1 tablet (25 mg total) by mouth daily. 90 tablet 3    fluticasone  (FLONASE ) 50 MCG/ACT nasal spray Place 2 sprays into both nostrils daily. 48 g 2   Fluticasone -Umeclidin-Vilant (TRELEGY ELLIPTA ) 100-62.5-25 MCG/ACT AEPB Inhale 1 puff into the lungs daily. 60 each 3   furosemide  (LASIX ) 20 MG tablet Take 1-2 tablets (20-40 mg total) by mouth daily. (Patient taking differently: Take 20 mg by mouth See admin instructions. Take 20 mg by mouth in the morning and at 12 NOON) 180 tablet 3   gabapentin  (NEURONTIN ) 300 MG capsule Take 1 capsule (300 mg total) by mouth 3 (three) times daily. 270 capsule 0   insulin  glargine (LANTUS  SOLOSTAR) 100 UNIT/ML Solostar Pen Inject 25 Units into the skin daily. 15 mL 1   Insulin  Pen Needle (INSUPEN PEN NEEDLES) 31G X 5 MM MISC Use daily as directed to inject lantus  100 each 3   meloxicam  (MOBIC ) 15 MG tablet Take 1 tablet (15 mg total) by mouth daily. 90 tablet 0   metFORMIN  (GLUCOPHAGE -XR) 500 MG 24 hr tablet Take 1 tablet (500 mg total) by mouth 2 (two) times daily with a meal. 90 tablet 0   montelukast  (SINGULAIR ) 10 MG tablet Take 1 tablet (10 mg total) by mouth at bedtime. 90 tablet 1   Multiple Vitamins-Minerals (CENTRUM ULTRA WOMENS) TABS Take 1 tablet by mouth daily with breakfast.     OXYGEN  Inhale 3-5  L/min into the lungs continuous.     Respiratory Therapy Supplies (NEBULIZER/TUBING/MOUTHPIECE) KIT Use with nebulizer 1 kit 3   tirzepatide  (MOUNJARO ) 5 MG/0.5ML Pen Inject 5 mg into the skin once a week. 2 mL 1   valsartan  (DIOVAN ) 80 MG tablet Take 1 tablet (80 mg total) by mouth daily. 90 tablet 1   No current facility-administered medications for this visit.    REVIEW OF SYSTEMS (negative unless checked):   Cardiac:  []  Chest pain or chest pressure? [x]  Shortness of breath upon activity? []  Shortness of breath when lying flat? []  Irregular heart rhythm?  Vascular:  [x]  Pain in calf, thigh, or hip brought on by walking? []  Pain in feet at night that wakes you up from your sleep? []  Blood clot in  your veins? []  Leg swelling?  Pulmonary:  [x]  Oxygen  at home? []  Productive cough? []  Wheezing?  Neurologic:  []  Sudden weakness in arms or legs? []  Sudden numbness in arms or legs? []  Sudden onset of difficult speaking or slurred speech? []  Temporary loss of vision in one eye? []  Problems with dizziness?  Gastrointestinal:  []  Blood in stool? []  Vomited blood?  Genitourinary:  []  Burning when urinating? []  Blood in urine?  Psychiatric:  []  Major depression  Hematologic:  []  Bleeding problems? []  Problems with blood clotting?  Dermatologic:  []  Rashes or ulcers?  Constitutional:  []  Fever or chills?  Ear/Nose/Throat:  []  Change in hearing? []  Nose bleeds? []  Sore throat?  Musculoskeletal:  []  Back pain? []  Joint pain? []  Muscle pain?   Physical Examination   Vitals:   05/26/24 1351  BP: (!) 141/87  Pulse: 86  Temp: 98.5 F (36.9 C)  TempSrc: Oral  SpO2: 100%  Weight: 198 lb 1.6 oz (89.9 kg)   Body mass index is 36.23 kg/m.  General:  WDWN in NAD; vital signs documented above Gait: Not observed HENT: WNL, normocephalic Pulmonary: normal non-labored breathing, on supplemental oxygen  Cardiac: regular Abdomen: soft, NT, no masses Skin: without rashes Vascular Exam/Pulses: brisk DP doppler signals bilaterally Extremities: without ischemic changes, without gangrene , without cellulitis; without open wounds;  Musculoskeletal: no muscle wasting or atrophy  Neurologic: A&O X 3;  No focal weakness or paresthesias are detected Psychiatric:  The pt has Normal affect.  Non-Invasive Vascular imaging   ABI (05/26/2024) R:  ABI: 0.62 (0.56),  PT: mono DP: mono TBI:  0.42 L:  ABI: 0.6 (0.53),  PT: mono DP: mono TBI: 0.54   Medical Decision Making   Yvonne Todd is a 73 y.o. female who presents for surveillance of PAD  Based on the patient's vascular studies, her ABIs remain stable since her last office visit.  Her right ABI is 0.62 and left  ABI is 0.6 She says that her bilateral lower extremity claudication remains the same.  She says at most she can walk up to an hour before her calves will cramp up.  This is not limiting to her mobility.  She denies any rest pain or tissue loss On exam she has brisk DP Doppler signals bilaterally.  She has no ulcerations. She will continue her aspirin  and statin and follow-up with our office in 1 year with repeat ABIs   Ahmed Holster PA-C [facility] Office: [phone]  Clinic MD: [place]

## 2024-05-27 ENCOUNTER — Other Ambulatory Visit (HOSPITAL_COMMUNITY): Payer: Self-pay

## 2024-05-28 ENCOUNTER — Other Ambulatory Visit (HOSPITAL_COMMUNITY): Payer: Self-pay

## 2024-05-30 ENCOUNTER — Other Ambulatory Visit: Payer: Self-pay

## 2024-06-01 ENCOUNTER — Other Ambulatory Visit: Payer: Self-pay | Admitting: Internal Medicine

## 2024-06-01 DIAGNOSIS — J9601 Acute respiratory failure with hypoxia: Secondary | ICD-10-CM

## 2024-06-02 ENCOUNTER — Other Ambulatory Visit (HOSPITAL_COMMUNITY): Payer: Self-pay

## 2024-06-02 MED ORDER — TRELEGY ELLIPTA 100-62.5-25 MCG/ACT IN AEPB
1.0000 | INHALATION_SPRAY | Freq: Every day | RESPIRATORY_TRACT | 3 refills | Status: AC
  Filled 2024-06-02: qty 60, 30d supply, fill #0
  Filled 2024-07-07: qty 60, 30d supply, fill #1
  Filled 2024-08-26 – 2024-08-31 (×4): qty 60, 30d supply, fill #2

## 2024-06-04 ENCOUNTER — Other Ambulatory Visit: Payer: Self-pay | Admitting: Internal Medicine

## 2024-06-04 DIAGNOSIS — E118 Type 2 diabetes mellitus with unspecified complications: Secondary | ICD-10-CM

## 2024-06-04 DIAGNOSIS — J449 Chronic obstructive pulmonary disease, unspecified: Secondary | ICD-10-CM | POA: Diagnosis not present

## 2024-06-06 ENCOUNTER — Other Ambulatory Visit: Payer: Self-pay

## 2024-06-06 ENCOUNTER — Other Ambulatory Visit (HOSPITAL_COMMUNITY): Payer: Self-pay

## 2024-06-06 MED ORDER — METFORMIN HCL ER 500 MG PO TB24
500.0000 mg | ORAL_TABLET | Freq: Two times a day (BID) | ORAL | 0 refills | Status: DC
  Filled 2024-06-06: qty 90, 45d supply, fill #0

## 2024-06-09 ENCOUNTER — Other Ambulatory Visit: Admitting: Pharmacist

## 2024-06-09 DIAGNOSIS — E118 Type 2 diabetes mellitus with unspecified complications: Secondary | ICD-10-CM

## 2024-06-09 NOTE — Progress Notes (Signed)
 06/09/2024 Name: Yvonne Todd MRN: [id number] DOB: August 13, 1951  Chief Complaint  Patient presents with   Diabetes   Medication Management    Yvonne Todd is a 73 y.o. year old female who presented for a telephone visit with her sister.   They were referred to the pharmacist by their PCP for assistance in managing diabetes.   Subjective:  Care Team: Primary Care Provider: Rollene Almarie LABOR, MD ; Next Scheduled Visit: none scheduled  Medication Access/Adherence  Current Pharmacy:  DARRYLE LONG - [facility] [address] [place] [address] Phone: [phone] Fax: [phone]  Patient reports affordability concerns with their medications: No  Patient reports access/transportation concerns to their pharmacy: No  Patient reports adherence concerns with their medications:  No    Diabetes:  Current medications: Jardiance  25 mg daily, Lantus  25 units daily at bedtime, metformin  XR 500 mg BID, Mounjaro  5 mg weekly (started 4 weeks ago), glipizide  1/2 tablet twice daily  Medications tried in the past: Rybelsus  (pt thinks it was bad stomach upset & broke out in rash), glipizide  5 mg (hypoglycemia), glipizide  2.5 mg (d/c when Mounjaro  increased to avoid hypoglycemia)  She reported some nausea when increased to Mounjaro  5 mg, no vomiting.  Needs BG under 200 to have a dental procedure  119 this morning fasting, 245 now only have had creamer (powdered)  Uses Dexcom G7 Receiver 14 day report Average Glucose 198 GMI 8.0% Time in Range 20% very high 37% high 43% in range 0% low 0% very low  Previous:  (added back glipizide  2.5 mg) 14 day report Average Glucose 221 GMI 8.6% Time in Range 29% very high 42% high 29% in range 0% low 0% very low  Previous 14 day report (on Mounjaro  5 mg, d/c'd glipizide  2.5 mg) Average Glucose 188 GMI 7.8% Time in Range 18% very high 32% high 48% in range 1% low 1% very low  Unsweet tea, coffee with  creamer, no sugar Eggs with toast - blackberries, bananas, oranges, peaches, stawberries Sandwiches at night, soups, brussel sprouts  Objective:  Lab Results  Component Value Date   HGBA1C 7.6 02/26/2024    Lab Results  Component Value Date   CREATININE 0.66 01/13/2024   BUN 19 01/13/2024   NA 140 01/13/2024   K 4.2 01/13/2024   CL 103 01/13/2024   CO2 26 01/13/2024    Lab Results  Component Value Date   CHOL 183 06/30/2023   HDL 33.50 (L) 06/30/2023   LDLCALC 111 (H) 02/18/2021   LDLDIRECT 74.0 06/30/2023   TRIG (H) 06/30/2023    467.0 Triglyceride is over 400; calculations on Lipids are invalid.   CHOLHDL 5 06/30/2023    Medications Reviewed Today     Reviewed by Merceda Lela SAUNDERS, [facility] (Pharmacist) on 06/09/24 at 1658  Med List Status: <None>   Medication Order Taking? Sig Documenting Provider Last Dose Status Informant  albuterol  (PROVENTIL ) (2.5 MG/3ML) 0.083% nebulizer solution [id number]  Inhale 3 mLs (2.5 mg total) by nebulization 3 (three) times daily. Rollene Almarie LABOR, MD  Active   albuterol  (VENTOLIN  HFA) 108 [phone] Base) MCG/ACT inhaler [id number]  Inhale 1-2 puffs into the lungs every 4 (four) hours as needed for wheezing or shortness of breath. Rollene Almarie LABOR, MD  Active   aspirin  EC 81 MG tablet [id number]  Take 1 tablet (81 mg total) by mouth daily.  Patient not taking: Reported on 05/26/2024   Pearlean Manus, MD  Active Self  atorvastatin  (  LIPITOR) 80 MG tablet [id number]  Take 1 tablet (80 mg total) by mouth at bedtime. Rollene Almarie LABOR, MD  Active   blood glucose meter kit and supplies [id number]  Dispense based on patient and insurance preference. Use up to four times daily as directed. (FOR ICD-10 E10.9, E11.9). Rollene Almarie LABOR, MD  Active    Patient not taking:   Discontinued 02/21/20 1415   co-enzyme Q-10 30 MG capsule [id number]  Take 30 mg by mouth daily. [provider]  Active Self  Continuous Glucose Receiver WILMOT  G7 [place]) DEVI [id number]  To use with G7 sensors Rollene Almarie LABOR, MD  Active   Continuous Glucose Sensor (DEXCOM G7 [place]) [place] [id number]  Apply sensor every 10 days Rollene Almarie LABOR, MD  Active Self  diphenhydrAMINE  (BENADRYL ) 25 MG tablet [id number]  Take 50 mg by mouth at bedtime. [provider]  Active Self  diphenhydramine -acetaminophen  (TYLENOL  PM) 25-500 MG TABS tablet [id number]  Take 2 tablets by mouth at bedtime as needed (for pain or sleep). [provider]  Active Self  empagliflozin  (JARDIANCE ) 25 MG TABS tablet [id number] Yes Take 1 tablet (25 mg total) by mouth daily. Rollene Almarie LABOR, MD  Active   fluticasone  (FLONASE ) 50 MCG/ACT nasal spray [id number]  Place 2 sprays into both nostrils daily. Rollene Almarie LABOR, MD  Active   Fluticasone -Umeclidin-Vilant (TRELEGY ELLIPTA ) 100-62.5-25 MCG/ACT AEPB [id number]  Inhale 1 puff into the lungs daily. Rollene Almarie LABOR, MD  Active   furosemide  (LASIX ) 20 MG tablet [id number]  Take 1-2 tablets (20-40 mg total) by mouth daily.  Patient taking differently: Take 20 mg by mouth See admin instructions. Take 20 mg by mouth in the morning and at 12 CURLY Rollene, Almarie LABOR, MD  Active Self  gabapentin  (NEURONTIN ) 300 MG capsule [id number]  Take 1 capsule (300 mg total) by mouth 3 (three) times daily. Rollene Almarie LABOR, MD  Active   glipiZIDE  (GLUCOTROL ) 5 MG tablet [id number] Yes Take 0.5 tablets (2.5 mg total) by mouth 2 (two) times daily before a meal. Rollene Almarie LABOR, MD  Active   insulin  glargine (LANTUS  SOLOSTAR) 100 UNIT/ML Solostar Pen [id number] Yes Inject 25 Units into the skin daily. Rollene Almarie LABOR, MD  Active   Insulin  Pen Needle (INSUPEN PEN NEEDLES) 31G X 5 MM MISC [id number]  Use daily as directed to inject lantus  Rollene Almarie LABOR, MD  Active    Patient not taking:   Discontinued 02/21/20 1415   meloxicam  (MOBIC ) 15 MG tablet [id number]  Take 1 tablet (15 mg total) by mouth  daily. Rollene Almarie LABOR, MD  Active   metFORMIN  (GLUCOPHAGE -XR) 500 MG 24 hr tablet [id number] Yes Take 1 tablet (500 mg total) by mouth 2 (two) times daily with a meal. Rollene Almarie LABOR, MD  Active   montelukast  (SINGULAIR ) 10 MG tablet [id number]  Take 1 tablet (10 mg total) by mouth at bedtime. Rollene Almarie LABOR, MD  Active   Multiple Vitamins-Minerals (CENTRUM ULTRA WOMENS) TABS [id number]  Take 1 tablet by mouth daily with breakfast. [provider]  Active Self  OXYGEN  [id number]  Inhale 3-5 L/min into the lungs continuous. [provider]  Active Self  Respiratory Therapy Supplies (NEBULIZER/TUBING/MOUTHPIECE) KIT [id number]  Use with nebulizer Rollene Almarie LABOR, MD  Active   tirzepatide  (MOUNJARO ) 5 MG/0.5ML Pen [id number] Yes Inject 5 mg into the skin once a week. Rollene Almarie LABOR, MD  Active   valsartan  (DIOVAN ) 80 MG tablet [id number]  Take 1  tablet (80 mg total) by mouth daily. Rollene Almarie LABOR, MD  Active             Assessment/Plan:   Diabetes: - Currently uncontrolled, A1c goal <7.5%. GMI and average glucose on Dexcom have slightly increased. Still having some hypoglycemia. - Continue Lantus  25 units daily, metformin  500 mg BID, Jardiance  25 mg daily - Continue Mounjaro  5 mg weekly  - Increase glipizide  to 5 mg 1 tablet twice daily with food, monitor for hypoglycemia - Continue to check BG with Dexcom   Follow Up Plan: 10/14  Darrelyn Drum, PharmD, BCPS, CPP Clinical Pharmacist Practitioner [place] [facility] [phone]

## 2024-06-13 ENCOUNTER — Other Ambulatory Visit (HOSPITAL_COMMUNITY): Payer: Self-pay

## 2024-06-13 MED ORDER — GLIPIZIDE 5 MG PO TABS
5.0000 mg | ORAL_TABLET | Freq: Two times a day (BID) | ORAL | 0 refills | Status: DC
  Filled 2024-06-13: qty 180, 90d supply, fill #0

## 2024-06-13 NOTE — Patient Instructions (Signed)
 It was a pleasure speaking with you today!  Increase glipizide  to 5 mg 1 tablet twice daily with food  Feel free to call with any questions or concerns!  Darrelyn Drum, PharmD, BCPS, CPP Clinical Pharmacist Practitioner [place] [facility] [phone]

## 2024-06-14 ENCOUNTER — Other Ambulatory Visit (HOSPITAL_COMMUNITY): Payer: Self-pay

## 2024-06-14 ENCOUNTER — Other Ambulatory Visit (INDEPENDENT_AMBULATORY_CARE_PROVIDER_SITE_OTHER): Admitting: Pharmacist

## 2024-06-14 DIAGNOSIS — E118 Type 2 diabetes mellitus with unspecified complications: Secondary | ICD-10-CM

## 2024-06-14 MED ORDER — TIRZEPATIDE 7.5 MG/0.5ML ~~LOC~~ SOAJ
7.5000 mg | SUBCUTANEOUS | 1 refills | Status: AC
  Filled 2024-06-14: qty 2, 28d supply, fill #0
  Filled 2024-08-26 – 2024-08-29 (×3): qty 2, 28d supply, fill #1

## 2024-06-14 MED ORDER — GLIPIZIDE 5 MG PO TABS: 2.5000 mg | ORAL_TABLET | Freq: Two times a day (BID) | ORAL | Status: DC

## 2024-06-14 NOTE — Progress Notes (Signed)
 06/14/2024 Name: Yvonne Todd MRN: [id number] DOB: Nov 20, 1950  Chief Complaint  Patient presents with   Diabetes   Medication Management    Yvonne Todd is a 73 y.o. year old female who presented for a telephone visit with her sister.   They were referred to the pharmacist by their PCP for assistance in managing diabetes.   Subjective:  Care Team: Primary Care Provider: Rollene Almarie LABOR, MD ; Next Scheduled Visit: none scheduled  Medication Access/Adherence  Current Pharmacy:  DARRYLE LONG - [facility] [address] [place] [address] Phone: [phone] Fax: [phone]  Patient reports affordability concerns with their medications: No  Patient reports access/transportation concerns to their pharmacy: No  Patient reports adherence concerns with their medications:  No    Diabetes:  Current medications: Jardiance  25 mg daily, Lantus  25 units daily at bedtime, metformin  XR 500 mg BID, Mounjaro  5 mg weekly (started 4 weeks ago), glipizide  5mg  1 tablet twice daily with food Medications tried in the past: Rybelsus  (pt thinks it was bad stomach upset & broke out in rash), glipizide  5 mg (hypoglycemia), glipizide  2.5 mg (d/c when Mounjaro  increased to avoid hypoglycemia)  Pt reports nausea with glipizide  that resolves after about an hour  Needs BG under 200 to have a dental procedure  Fasting typically 100-130, goes up a lot after eating   Uses Dexcom G7 Receiver 7 day report Average Glucose 206 GMI N/A Time in Range 22% very high 39% high 39% in range 0% low 0% very low  Previous: 14 day report Average Glucose 198 GMI 8.0% Time in Range 20% very high 37% high 43% in range 0% low 0% very low   Unsweet tea, coffee with creamer, no sugar Eggs with toast - blackberries, bananas, oranges, peaches, stawberries Sandwiches at night, soups, brussel sprouts  Objective:  Lab Results  Component Value Date   HGBA1C 7.6 02/26/2024     Lab Results  Component Value Date   CREATININE 0.66 01/13/2024   BUN 19 01/13/2024   NA 140 01/13/2024   K 4.2 01/13/2024   CL 103 01/13/2024   CO2 26 01/13/2024    Lab Results  Component Value Date   CHOL 183 06/30/2023   HDL 33.50 (L) 06/30/2023   LDLCALC 111 (H) 02/18/2021   LDLDIRECT 74.0 06/30/2023   TRIG (H) 06/30/2023    467.0 Triglyceride is over 400; calculations on Lipids are invalid.   CHOLHDL 5 06/30/2023    Medications Reviewed Today     Reviewed by Merceda Lela SAUNDERS, [facility] (Pharmacist) on 06/14/24 at 1625  Med List Status: <None>   Medication Order Taking? Sig Documenting Provider Last Dose Status Informant  albuterol  (PROVENTIL ) (2.5 MG/3ML) 0.083% nebulizer solution [id number]  Inhale 3 mLs (2.5 mg total) by nebulization 3 (three) times daily. Rollene Almarie LABOR, MD  Active   albuterol  (VENTOLIN  HFA) 108 [phone] Base) MCG/ACT inhaler [id number]  Inhale 1-2 puffs into the lungs every 4 (four) hours as needed for wheezing or shortness of breath. Rollene Almarie LABOR, MD  Active   aspirin  EC 81 MG tablet [id number]  Take 1 tablet (81 mg total) by mouth daily.  Patient not taking: Reported on 05/26/2024   Pearlean Manus, MD  Active Self  atorvastatin  (LIPITOR) 80 MG tablet [id number]  Take 1 tablet (80 mg total) by mouth at bedtime. Rollene Almarie LABOR, MD  Active   blood glucose meter kit and supplies [id number]  Dispense based on patient and insurance preference.  Use up to four times daily as directed. (FOR ICD-10 E10.9, E11.9). Rollene Almarie LABOR, MD  Active    Patient not taking:   Discontinued 02/21/20 1415   co-enzyme Q-10 30 MG capsule [id number]  Take 30 mg by mouth daily. [provider]  Active Self  Continuous Glucose Receiver WILMOT G7 [place]) DEVI [id number]  To use with G7 sensors Rollene Almarie LABOR, MD  Active   Continuous Glucose Sensor (DEXCOM G7 [place]) [place] [id number]  Apply sensor every 10 days Rollene Almarie LABOR, MD  Active  Self  diphenhydrAMINE  (BENADRYL ) 25 MG tablet [id number]  Take 50 mg by mouth at bedtime. [provider]  Active Self  diphenhydramine -acetaminophen  (TYLENOL  PM) 25-500 MG TABS tablet [id number]  Take 2 tablets by mouth at bedtime as needed (for pain or sleep). [provider]  Active Self  empagliflozin  (JARDIANCE ) 25 MG TABS tablet [id number] Yes Take 1 tablet (25 mg total) by mouth daily. Rollene Almarie LABOR, MD  Active   fluticasone  (FLONASE ) 50 MCG/ACT nasal spray [id number]  Place 2 sprays into both nostrils daily. Rollene Almarie LABOR, MD  Active   Fluticasone -Umeclidin-Vilant (TRELEGY ELLIPTA ) 100-62.5-25 MCG/ACT AEPB [id number]  Inhale 1 puff into the lungs daily. Rollene Almarie LABOR, MD  Active   furosemide  (LASIX ) 20 MG tablet [id number]  Take 1-2 tablets (20-40 mg total) by mouth daily.  Patient taking differently: Take 20 mg by mouth See admin instructions. Take 20 mg by mouth in the morning and at 12 CURLY Rollene, Almarie LABOR, MD  Active Self  gabapentin  (NEURONTIN ) 300 MG capsule [id number]  Take 1 capsule (300 mg total) by mouth 3 (three) times daily. Rollene Almarie LABOR, MD  Active   glipiZIDE  (GLUCOTROL ) 5 MG tablet [id number] Yes Take 1 tablet (5 mg total) by mouth 2 (two) times daily before a meal. Rollene Almarie LABOR, MD  Active   insulin  glargine (LANTUS  SOLOSTAR) 100 UNIT/ML Solostar Pen [id number] Yes Inject 25 Units into the skin daily. Rollene Almarie LABOR, MD  Active   Insulin  Pen Needle (INSUPEN PEN NEEDLES) 31G X 5 MM MISC [id number]  Use daily as directed to inject lantus  Rollene Almarie LABOR, MD  Active    Patient not taking:   Discontinued 02/21/20 1415   meloxicam  (MOBIC ) 15 MG tablet [id number]  Take 1 tablet (15 mg total) by mouth daily. Rollene Almarie LABOR, MD  Active   metFORMIN  (GLUCOPHAGE -XR) 500 MG 24 hr tablet [id number] Yes Take 1 tablet (500 mg total) by mouth 2 (two) times daily with a meal. Rollene Almarie LABOR, MD  Active    montelukast  (SINGULAIR ) 10 MG tablet [id number]  Take 1 tablet (10 mg total) by mouth at bedtime. Rollene Almarie LABOR, MD  Active   Multiple Vitamins-Minerals (CENTRUM ULTRA WOMENS) TABS [id number]  Take 1 tablet by mouth daily with breakfast. [provider]  Active Self  OXYGEN  [id number]  Inhale 3-5 L/min into the lungs continuous. [provider]  Active Self  Respiratory Therapy Supplies (NEBULIZER/TUBING/MOUTHPIECE) KIT [id number]  Use with nebulizer Rollene Almarie LABOR, MD  Active   tirzepatide  (MOUNJARO ) 5 MG/0.5ML Pen [id number] Yes Inject 5 mg into the skin once a week. Rollene Almarie LABOR, MD  Active   valsartan  (DIOVAN ) 80 MG tablet [id number]  Take 1 tablet (80 mg total) by mouth daily. Rollene Almarie LABOR, MD  Active             Assessment/Plan:   Diabetes: - Currently uncontrolled, A1c goal <7.5%. GMI and average glucose  on Dexcom have slightly increased. No hypoglycemia noted. - Continue Lantus  25 units daily, metformin  500 mg BID, Jardiance  25 mg daily - Increase Mounjaro  to 7.5 mg weekly  - Take glipizide  5 mg 1/2 tablet twice daily with food, monitor for hypoglycemia - Continue to check BG with Dexcom   Follow Up Plan: 10/28  Darrelyn Drum, PharmD, BCPS, CPP Clinical Pharmacist Practitioner [place] [facility] at [facility] [phone]

## 2024-06-14 NOTE — Patient Instructions (Signed)
 It was a pleasure speaking with you today!  Increase mounjaro  to 7.5 mg weekly. Decrease glipizide  to 5 mg 1/2 tablet twice daily with food.  Feel free to call with any questions or concerns!  Darrelyn Drum, PharmD, BCPS, CPP Clinical Pharmacist Practitioner [place] [facility] at [facility] [phone]

## 2024-06-15 ENCOUNTER — Other Ambulatory Visit: Payer: Self-pay

## 2024-06-15 ENCOUNTER — Other Ambulatory Visit (HOSPITAL_COMMUNITY): Payer: Self-pay

## 2024-06-21 ENCOUNTER — Other Ambulatory Visit: Payer: Self-pay | Admitting: Internal Medicine

## 2024-06-21 ENCOUNTER — Other Ambulatory Visit: Payer: Self-pay

## 2024-06-21 ENCOUNTER — Other Ambulatory Visit (HOSPITAL_COMMUNITY): Payer: Self-pay

## 2024-06-21 ENCOUNTER — Other Ambulatory Visit (HOSPITAL_BASED_OUTPATIENT_CLINIC_OR_DEPARTMENT_OTHER): Payer: Self-pay

## 2024-06-21 DIAGNOSIS — E118 Type 2 diabetes mellitus with unspecified complications: Secondary | ICD-10-CM

## 2024-06-21 MED ORDER — DEXCOM G7 RECEIVER DEVI
0 refills | Status: AC
  Filled 2024-06-21: qty 1, 30d supply, fill #0
  Filled 2024-06-21: qty 1, 90d supply, fill #0

## 2024-06-22 ENCOUNTER — Ambulatory Visit

## 2024-06-22 ENCOUNTER — Other Ambulatory Visit: Payer: Self-pay

## 2024-06-22 VITALS — Ht 62.0 in | Wt 198.0 lb

## 2024-06-22 DIAGNOSIS — E785 Hyperlipidemia, unspecified: Secondary | ICD-10-CM | POA: Diagnosis not present

## 2024-06-22 DIAGNOSIS — Z780 Asymptomatic menopausal state: Secondary | ICD-10-CM

## 2024-06-22 DIAGNOSIS — Z87891 Personal history of nicotine dependence: Secondary | ICD-10-CM | POA: Diagnosis not present

## 2024-06-22 DIAGNOSIS — Z0000 Encounter for general adult medical examination without abnormal findings: Secondary | ICD-10-CM | POA: Diagnosis not present

## 2024-06-22 DIAGNOSIS — Z122 Encounter for screening for malignant neoplasm of respiratory organs: Secondary | ICD-10-CM

## 2024-06-22 DIAGNOSIS — E1169 Type 2 diabetes mellitus with other specified complication: Secondary | ICD-10-CM | POA: Diagnosis not present

## 2024-06-22 DIAGNOSIS — Z1211 Encounter for screening for malignant neoplasm of colon: Secondary | ICD-10-CM

## 2024-06-22 DIAGNOSIS — E118 Type 2 diabetes mellitus with unspecified complications: Secondary | ICD-10-CM | POA: Diagnosis not present

## 2024-06-22 DIAGNOSIS — F17210 Nicotine dependence, cigarettes, uncomplicated: Secondary | ICD-10-CM

## 2024-06-22 NOTE — Patient Instructions (Addendum)
 Ms. Crislip,  Thank you for taking the time for your Medicare Wellness Visit. I appreciate your continued commitment to your health goals. Please review the care plan we discussed, and feel free to reach out if I can assist you further.  Medicare recommends these wellness visits once per year to help you and your care team stay ahead of potential health issues. These visits are designed to focus on prevention, allowing your provider to concentrate on managing your acute and chronic conditions during your regular appointments.  Please note that Annual Wellness Visits do not include a physical exam. Some assessments may be limited, especially if the visit was conducted virtually. If needed, we may recommend a separate in-person follow-up with your provider.  Ongoing Care Seeing your primary care provider every 3 to 6 months helps us  monitor your health and provide consistent, personalized care.   Referrals If a referral was made during today's visit and you haven't received any updates within two weeks, please contact the referred provider directly to check on the status.  Recommended Screenings:  Health Maintenance  Topic Date Due   Yearly kidney health urinalysis for diabetes  Never done   DEXA scan (bone density measurement)  Never done   Eye exam for diabetics  07/22/2023   Screening for Lung Cancer  03/08/2024   Flu Shot  04/01/2024   COVID-19 Vaccine (6 - 2025-26 season) 05/02/2024   Complete foot exam   06/21/2024   Colon Cancer Screening  06/22/2025*   Hemoglobin A1C  08/27/2024   Yearly kidney function blood test for diabetes  01/12/2025   Medicare Annual Wellness Visit  06/22/2025   Breast Cancer Screening  05/11/2026   DTaP/Tdap/Td vaccine (2 - Td or Tdap) 12/02/2027   Pneumococcal Vaccine for age over 89  Completed   Hepatitis C Screening  Completed   Meningitis B Vaccine  Aged Out   Zoster (Shingles) Vaccine  Discontinued  *Topic was postponed. The date shown is not the  original due date.       06/22/2024   10:15 AM  Advanced Directives  Does Patient Have a Medical Advance Directive? Yes  Type of Estate agent of [place];Living will  Copy of Healthcare Power of Attorney in Chart? No - copy requested   Advance Care Planning is important because it: Ensures you receive medical care that aligns with your values, goals, and preferences. Provides guidance to your family and loved ones, reducing the emotional burden of decision-making during critical moments.  Vision: Annual vision screenings are recommended for early detection of glaucoma, cataracts, and diabetic retinopathy. These exams can also reveal signs of chronic conditions such as diabetes and high blood pressure.  Dental: Annual dental screenings help detect early signs of oral cancer, gum disease, and other conditions linked to overall health, including heart disease and diabetes.
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
## 2024-06-28 ENCOUNTER — Other Ambulatory Visit (HOSPITAL_COMMUNITY): Payer: Self-pay

## 2024-06-28 ENCOUNTER — Other Ambulatory Visit: Payer: Self-pay

## 2024-06-28 ENCOUNTER — Other Ambulatory Visit: Admitting: Pharmacist

## 2024-06-28 DIAGNOSIS — E118 Type 2 diabetes mellitus with unspecified complications: Secondary | ICD-10-CM

## 2024-06-28 MED ORDER — FAMOTIDINE 40 MG PO TABS
40.0000 mg | ORAL_TABLET | Freq: Every day | ORAL | 0 refills | Status: DC
  Filled 2024-06-28: qty 90, 90d supply, fill #0

## 2024-06-28 NOTE — Progress Notes (Signed)
 06/28/2024 Name: Yvonne Todd MRN: [id number] DOB: 10-01-1950  Chief Complaint  Patient presents with   Diabetes   Medication Management     Yvonne Todd is a 73 y.o. year old female who presented for a telephone visit with her sister.   They were referred to the pharmacist by their PCP for assistance in managing diabetes.   Subjective:  Care Team: Primary Care Provider: Rollene Almarie LABOR, MD ; Next Scheduled Visit: 07/04/24  Medication Access/Adherence  Current Pharmacy:  [facility] - [facility] [address] [place] [address] Phone: [phone] Fax: [phone]  Patient reports affordability concerns with their medications: No  Patient reports access/transportation concerns to their pharmacy: No  Patient reports adherence concerns with their medications:  No    Diabetes:  Current medications: Jardiance  25 mg daily, Lantus  25 units daily at bedtime, metformin  XR 500 mg BID, Mounjaro  7.5 mg weekly (started last week), glipizide  5mg  1/2 tablet twice daily with food Medications tried in the past: Rybelsus  (pt thinks it was bad stomach upset & broke out in rash), glipizide  5 mg (hypoglycemia), glipizide  2.5 mg (d/c when Mounjaro  increased to avoid hypoglycemia)  Pt reports worsened acid reflux - having to sleep upright in recliner  Needs BG under 200 to have a dental procedure   Uses Dexcom G7 Receiver 7 day report Average Glucose 136 GMI n/a  Time in Range 0% very high 8% high 91% in range 1% low 0% very low In the AM fasting have been 62-84  Previous: 7 day report Average Glucose 206 GMI N/A Time in Range 22% very high 39% high 39% in range 0% low 0% very low  *Pt notes she has been eating a lot more veggies and protein. Has been focusing on diet changes to lower BG. Unsweet tea, coffee with creamer, no sugar Eggs with toast - blackberries, bananas, oranges, peaches, stawberries Sandwiches at night, soups, brussel  sprouts  Objective:  Lab Results  Component Value Date   HGBA1C 7.6 02/26/2024    Lab Results  Component Value Date   CREATININE 0.66 01/13/2024   BUN 19 01/13/2024   NA 140 01/13/2024   K 4.2 01/13/2024   CL 103 01/13/2024   CO2 26 01/13/2024    Lab Results  Component Value Date   CHOL 183 06/30/2023   HDL 33.50 (L) 06/30/2023   LDLCALC 111 (H) 02/18/2021   LDLDIRECT 74.0 06/30/2023   TRIG (H) 06/30/2023    467.0 Triglyceride is over 400; calculations on Lipids are invalid.   CHOLHDL 5 06/30/2023    Medications Reviewed Today     Reviewed by Merceda Lela SAUNDERS, [facility] (Pharmacist) on 06/28/24 at 1523  Med List Status: <None>   Medication Order Taking? Sig Documenting Provider Last Dose Status Informant  albuterol  (PROVENTIL ) (2.5 MG/3ML) 0.083% nebulizer solution [id number]  Inhale 3 mLs (2.5 mg total) by nebulization 3 (three) times daily. Rollene Almarie LABOR, MD  Active   albuterol  (VENTOLIN  HFA) 108 [phone] Base) MCG/ACT inhaler [id number]  Inhale 1-2 puffs into the lungs every 4 (four) hours as needed for wheezing or shortness of breath. Rollene Almarie LABOR, MD  Active   aspirin  EC 81 MG tablet [id number]  Take 1 tablet (81 mg total) by mouth daily. Pearlean Manus, MD  Active Self  atorvastatin  (LIPITOR) 80 MG tablet [id number]  Take 1 tablet (80 mg total) by mouth at bedtime. Rollene Almarie LABOR, MD  Active   blood glucose meter kit and supplies  [id number]  Dispense based on patient and insurance preference. Use up to four times daily as directed. (FOR ICD-10 E10.9, E11.9). Rollene Almarie LABOR, MD  Active    Patient not taking:   Discontinued 02/21/20 1415   co-enzyme Q-10 30 MG capsule [id number]  Take 30 mg by mouth daily. [provider]  Active Self  Continuous Glucose Receiver WILMOT G7 [place]) DEVI [id number]  To use with G7 sensors Rollene Almarie LABOR, MD  Active   Continuous Glucose Sensor (DEXCOM G7 [place]) [place] [id number]  Apply sensor every 10  days Rollene Almarie LABOR, MD  Active Self  diphenhydrAMINE  (BENADRYL ) 25 MG tablet [id number]  Take 50 mg by mouth at bedtime. [provider]  Active Self  diphenhydramine -acetaminophen  (TYLENOL  PM) 25-500 MG TABS tablet [id number]  Take 2 tablets by mouth at bedtime as needed (for pain or sleep). [provider]  Active Self  empagliflozin  (JARDIANCE ) 25 MG TABS tablet [id number]  Take 1 tablet (25 mg total) by mouth daily. Rollene Almarie LABOR, MD  Active   famotidine (PEPCID) 40 MG tablet [id number] Yes Take 1 tablet (40 mg total) by mouth daily. Rollene Almarie LABOR, MD  Active   fluticasone  (FLONASE ) 50 MCG/ACT nasal spray [id number]  Place 2 sprays into both nostrils daily. Rollene Almarie LABOR, MD  Active   Fluticasone -Umeclidin-Vilant (TRELEGY ELLIPTA ) 100-62.5-25 MCG/ACT AEPB [id number]  Inhale 1 puff into the lungs daily. Rollene Almarie LABOR, MD  Active   furosemide  (LASIX ) 20 MG tablet [id number]  Take 1-2 tablets (20-40 mg total) by mouth daily.  Patient taking differently: Take 20 mg by mouth See admin instructions. Take 20 mg by mouth in the morning and at 12 CURLY Rollene, Almarie LABOR, MD  Active Self  gabapentin  (NEURONTIN ) 300 MG capsule [id number]  Take 1 capsule (300 mg total) by mouth 3 (three) times daily. Rollene Almarie LABOR, MD  Active   glipiZIDE  (GLUCOTROL ) 5 MG tablet [id number] Yes Take 0.5 tablets (2.5 mg total) by mouth 2 (two) times daily before a meal. Rollene Almarie LABOR, MD  Active   insulin  glargine (LANTUS  SOLOSTAR) 100 UNIT/ML Solostar Pen [id number] Yes Inject 25 Units into the skin daily. Rollene Almarie LABOR, MD  Active   Insulin  Pen Needle (INSUPEN PEN NEEDLES) 31G X 5 MM MISC [id number]  Use daily as directed to inject lantus  Rollene Almarie LABOR, MD  Active    Patient not taking:   Discontinued 02/21/20 1415   meloxicam  (MOBIC ) 15 MG tablet [id number]  Take 1 tablet (15 mg total) by mouth daily. Rollene Almarie LABOR, MD  Active    metFORMIN  (GLUCOPHAGE -XR) 500 MG 24 hr tablet [id number] Yes Take 1 tablet (500 mg total) by mouth 2 (two) times daily with a meal. Rollene Almarie LABOR, MD  Active   montelukast  (SINGULAIR ) 10 MG tablet [id number]  Take 1 tablet (10 mg total) by mouth at bedtime. Rollene Almarie LABOR, MD  Active   Multiple Vitamins-Minerals (CENTRUM ULTRA WOMENS) TABS [id number]  Take 1 tablet by mouth daily with breakfast. [provider]  Active Self  OXYGEN  [id number]  Inhale 3-5 L/min into the lungs continuous. [provider]  Active Self  Respiratory Therapy Supplies (NEBULIZER/TUBING/MOUTHPIECE) KIT [id number]  Use with nebulizer Rollene Almarie LABOR, MD  Active   tirzepatide  (MOUNJARO ) 7.5 MG/0.5ML Pen [id number] Yes Inject 7.5 mg into the skin once a week. Rollene Almarie LABOR, MD  Active   valsartan  (DIOVAN ) 80 MG tablet [id number]  Take 1 tablet (80 mg total) by mouth  daily. Rollene Almarie LABOR, MD  Active             Assessment/Plan:   Diabetes: - Currently controlled, A1c goal <7.5% per Dexcom data. Average BG has decreased significantly since increasing Mounjaro  an pt has made diet changes. - Continue Lantus  25 units daily, metformin  500 mg BID, Jardiance  25 mg daily, glipizide  5 mg 1/2 tablet BID AC - Continue Mounjaro  7.5 mg weekly  - Recommend Pepcid 40 mg daily for acid reflux. Likely is related to Mounjaro  dose increase - may improve over time  - Continue to check BG with Dexcom - Pt will come in office on 11/3 prior to PCP appt to download data report from Dexcom to better know how to adjust medications - may need to reduce basal insulin  due to fasting hypoglycemia   Follow Up Plan: 11/3  Darrelyn Drum, PharmD, BCPS, CPP Clinical Pharmacist Practitioner [facility] [phone]

## 2024-06-28 NOTE — Patient Instructions (Signed)
 It was a pleasure speaking with you today!  Continue current regimen. Bring Dexcom with you on 11/3 so we can review your Dexcom information and make further medication adjustments if needed.  Feel free to call with any questions or concerns!  Darrelyn Drum, PharmD, BCPS, CPP Clinical Pharmacist Practitioner [facility] [phone]

## 2024-07-04 ENCOUNTER — Ambulatory Visit

## 2024-07-04 ENCOUNTER — Ambulatory Visit: Admitting: Internal Medicine

## 2024-07-07 ENCOUNTER — Ambulatory Visit: Admitting: Podiatry

## 2024-07-08 ENCOUNTER — Emergency Department (HOSPITAL_COMMUNITY)

## 2024-07-08 ENCOUNTER — Other Ambulatory Visit: Payer: Self-pay

## 2024-07-08 ENCOUNTER — Other Ambulatory Visit (HOSPITAL_COMMUNITY): Payer: Self-pay

## 2024-07-08 ENCOUNTER — Inpatient Hospital Stay (HOSPITAL_COMMUNITY)

## 2024-07-08 ENCOUNTER — Inpatient Hospital Stay (HOSPITAL_COMMUNITY)
Admission: EM | Admit: 2024-07-08 | Discharge: 2024-07-13 | DRG: 682 | Disposition: A | Discharge status: Skilled Nursing Facility | Attending: Internal Medicine | Admitting: Internal Medicine

## 2024-07-08 DIAGNOSIS — Z6836 Body mass index (BMI) 36.0-36.9, adult: Secondary | ICD-10-CM

## 2024-07-08 DIAGNOSIS — Z8419 Family history of other disorders of kidney and ureter: Secondary | ICD-10-CM

## 2024-07-08 DIAGNOSIS — Z96651 Presence of right artificial knee joint: Secondary | ICD-10-CM | POA: Diagnosis present

## 2024-07-08 DIAGNOSIS — Z7985 Long-term (current) use of injectable non-insulin antidiabetic drugs: Secondary | ICD-10-CM

## 2024-07-08 DIAGNOSIS — Z9981 Dependence on supplemental oxygen: Secondary | ICD-10-CM | POA: Diagnosis not present

## 2024-07-08 DIAGNOSIS — I10 Essential (primary) hypertension: Secondary | ICD-10-CM | POA: Diagnosis present

## 2024-07-08 DIAGNOSIS — E1151 Type 2 diabetes mellitus with diabetic peripheral angiopathy without gangrene: Secondary | ICD-10-CM | POA: Diagnosis present

## 2024-07-08 DIAGNOSIS — G9341 Metabolic encephalopathy: Secondary | ICD-10-CM | POA: Diagnosis present

## 2024-07-08 DIAGNOSIS — E785 Hyperlipidemia, unspecified: Secondary | ICD-10-CM | POA: Diagnosis present

## 2024-07-08 DIAGNOSIS — Z8249 Family history of ischemic heart disease and other diseases of the circulatory system: Secondary | ICD-10-CM

## 2024-07-08 DIAGNOSIS — E118 Type 2 diabetes mellitus with unspecified complications: Secondary | ICD-10-CM | POA: Diagnosis not present

## 2024-07-08 DIAGNOSIS — E870 Hyperosmolality and hypernatremia: Secondary | ICD-10-CM | POA: Diagnosis present

## 2024-07-08 DIAGNOSIS — Z91041 Radiographic dye allergy status: Secondary | ICD-10-CM | POA: Diagnosis not present

## 2024-07-08 DIAGNOSIS — E876 Hypokalemia: Secondary | ICD-10-CM | POA: Diagnosis present

## 2024-07-08 DIAGNOSIS — J439 Emphysema, unspecified: Secondary | ICD-10-CM | POA: Diagnosis present

## 2024-07-08 DIAGNOSIS — Z801 Family history of malignant neoplasm of trachea, bronchus and lung: Secondary | ICD-10-CM

## 2024-07-08 DIAGNOSIS — Z880 Allergy status to penicillin: Secondary | ICD-10-CM

## 2024-07-08 DIAGNOSIS — Z7984 Long term (current) use of oral hypoglycemic drugs: Secondary | ICD-10-CM

## 2024-07-08 DIAGNOSIS — Z79899 Other long term (current) drug therapy: Secondary | ICD-10-CM

## 2024-07-08 DIAGNOSIS — G9340 Encephalopathy, unspecified: Principal | ICD-10-CM | POA: Diagnosis present

## 2024-07-08 DIAGNOSIS — N179 Acute kidney failure, unspecified: Principal | ICD-10-CM | POA: Diagnosis present

## 2024-07-08 DIAGNOSIS — D75839 Thrombocytosis, unspecified: Secondary | ICD-10-CM | POA: Diagnosis present

## 2024-07-08 DIAGNOSIS — Z7951 Long term (current) use of inhaled steroids: Secondary | ICD-10-CM

## 2024-07-08 DIAGNOSIS — E1110 Type 2 diabetes mellitus with ketoacidosis without coma: Secondary | ICD-10-CM | POA: Diagnosis present

## 2024-07-08 DIAGNOSIS — J9611 Chronic respiratory failure with hypoxia: Secondary | ICD-10-CM | POA: Diagnosis present

## 2024-07-08 DIAGNOSIS — R4182 Altered mental status, unspecified: Secondary | ICD-10-CM | POA: Diagnosis present

## 2024-07-08 DIAGNOSIS — Z823 Family history of stroke: Secondary | ICD-10-CM

## 2024-07-08 DIAGNOSIS — Z803 Family history of malignant neoplasm of breast: Secondary | ICD-10-CM

## 2024-07-08 DIAGNOSIS — J4489 Other specified chronic obstructive pulmonary disease: Secondary | ICD-10-CM | POA: Diagnosis present

## 2024-07-08 DIAGNOSIS — E1169 Type 2 diabetes mellitus with other specified complication: Secondary | ICD-10-CM | POA: Diagnosis not present

## 2024-07-08 DIAGNOSIS — Z791 Long term (current) use of non-steroidal anti-inflammatories (NSAID): Secondary | ICD-10-CM

## 2024-07-08 DIAGNOSIS — Z833 Family history of diabetes mellitus: Secondary | ICD-10-CM

## 2024-07-08 DIAGNOSIS — Z87891 Personal history of nicotine dependence: Secondary | ICD-10-CM | POA: Diagnosis not present

## 2024-07-08 DIAGNOSIS — Z91013 Allergy to seafood: Secondary | ICD-10-CM

## 2024-07-08 DIAGNOSIS — Z7982 Long term (current) use of aspirin: Secondary | ICD-10-CM | POA: Diagnosis not present

## 2024-07-08 DIAGNOSIS — Z821 Family history of blindness and visual loss: Secondary | ICD-10-CM

## 2024-07-08 DIAGNOSIS — Z794 Long term (current) use of insulin: Secondary | ICD-10-CM | POA: Diagnosis not present

## 2024-07-08 DIAGNOSIS — Z881 Allergy status to other antibiotic agents status: Secondary | ICD-10-CM

## 2024-07-08 DIAGNOSIS — E66812 Obesity, class 2: Secondary | ICD-10-CM | POA: Diagnosis present

## 2024-07-08 DIAGNOSIS — Z5181 Encounter for therapeutic drug level monitoring: Secondary | ICD-10-CM | POA: Diagnosis not present

## 2024-07-08 DIAGNOSIS — R569 Unspecified convulsions: Secondary | ICD-10-CM | POA: Diagnosis not present

## 2024-07-08 DIAGNOSIS — Z825 Family history of asthma and other chronic lower respiratory diseases: Secondary | ICD-10-CM

## 2024-07-08 LAB — URINALYSIS, ROUTINE W REFLEX MICROSCOPIC
Bacteria, UA: NONE SEEN
Bilirubin Urine: NEGATIVE
Glucose, UA: >=500 mg/dL — AB
Ketones, ur: 5 mg/dL — AB
Leukocytes,Ua: NEGATIVE
Nitrite: NEGATIVE
Protein, ur: 30 mg/dL — AB
Specific Gravity, Urine: 1.022 (ref 1.005–1.030)
pH: 5 (ref 5.0–8.0)

## 2024-07-08 LAB — I-STAT VENOUS BLOOD GAS, ED
Acid-base deficit: 4 mmol/L — ABNORMAL HIGH (ref 0.0–2.0)
Bicarbonate: 21 mmol/L (ref 20.0–28.0)
Calcium, Ion: 1.03 mmol/L — ABNORMAL LOW (ref 1.15–1.40)
HCT: 38 % (ref 36.0–46.0)
Hemoglobin: 12.9 g/dL (ref 12.0–15.0)
O2 Saturation: 98 %
Potassium: 3.3 mmol/L — ABNORMAL LOW (ref 3.5–5.1)
Sodium: 141 mmol/L (ref 135–145)
TCO2: 22 mmol/L (ref 22–32)
pCO2, Ven: 38.7 mmHg — ABNORMAL LOW (ref 44–60)
pH, Ven: 7.343 (ref 7.25–7.43)
pO2, Ven: 115 mmHg — ABNORMAL HIGH (ref 32–45)

## 2024-07-08 LAB — COMPREHENSIVE METABOLIC PANEL WITH GFR
ALT: 21 U/L (ref 0–44)
AST: 19 U/L (ref 15–41)
Albumin: 3.6 g/dL (ref 3.5–5.0)
Alkaline Phosphatase: 85 U/L (ref 38–126)
Anion gap: 19 — ABNORMAL HIGH (ref 5–15)
BUN: 102 mg/dL — ABNORMAL HIGH (ref 8–23)
CO2: 18 mmol/L — ABNORMAL LOW (ref 22–32)
Calcium: 8.5 mg/dL — ABNORMAL LOW (ref 8.9–10.3)
Chloride: 101 mmol/L (ref 98–111)
Creatinine, Ser: 1.55 mg/dL — ABNORMAL HIGH (ref 0.44–1.00)
GFR, Estimated: 35 mL/min — ABNORMAL LOW (ref >60)
Glucose, Bld: 415 mg/dL — ABNORMAL HIGH (ref 70–99)
Potassium: 3.7 mmol/L (ref 3.5–5.1)
Sodium: 138 mmol/L (ref 135–145)
Total Bilirubin: 1 mg/dL (ref 0.0–1.2)
Total Protein: 7.1 g/dL (ref 6.5–8.1)

## 2024-07-08 LAB — I-STAT CHEM 8, ED
BUN: 95 mg/dL — ABNORMAL HIGH (ref 8–23)
Calcium, Ion: 1.05 mmol/L — ABNORMAL LOW (ref 1.15–1.40)
Chloride: 108 mmol/L (ref 98–111)
Creatinine, Ser: 1.3 mg/dL — ABNORMAL HIGH (ref 0.44–1.00)
Glucose, Bld: 398 mg/dL — ABNORMAL HIGH (ref 70–99)
HCT: 41 % (ref 36.0–46.0)
Hemoglobin: 13.9 g/dL (ref 12.0–15.0)
Potassium: 3.4 mmol/L — ABNORMAL LOW (ref 3.5–5.1)
Sodium: 142 mmol/L (ref 135–145)
TCO2: 21 mmol/L — ABNORMAL LOW (ref 22–32)

## 2024-07-08 LAB — CBC WITH DIFFERENTIAL/PLATELET
Abs Immature Granulocytes: 0.03 K/uL (ref 0.00–0.07)
Basophils Absolute: 0 K/uL (ref 0.0–0.1)
Basophils Relative: 0 %
Eosinophils Absolute: 0 K/uL (ref 0.0–0.5)
Eosinophils Relative: 0 %
HCT: 43.4 % (ref 36.0–46.0)
Hemoglobin: 14.7 g/dL (ref 12.0–15.0)
Immature Granulocytes: 0 %
Lymphocytes Relative: 10 %
Lymphs Abs: 1.1 K/uL (ref 0.7–4.0)
MCH: 30.8 pg (ref 26.0–34.0)
MCHC: 33.9 g/dL (ref 30.0–36.0)
MCV: 90.8 fL (ref 80.0–100.0)
Monocytes Absolute: 0.9 K/uL (ref 0.1–1.0)
Monocytes Relative: 9 %
Neutro Abs: 8.3 K/uL — ABNORMAL HIGH (ref 1.7–7.7)
Neutrophils Relative %: 81 %
Platelets: 469 K/uL — ABNORMAL HIGH (ref 150–400)
RBC: 4.78 MIL/uL (ref 3.87–5.11)
RDW: 12.4 % (ref 11.5–15.5)
WBC: 10.4 K/uL (ref 4.0–10.5)
nRBC: 0 % (ref 0.0–0.2)

## 2024-07-08 LAB — CK: Total CK: 78 U/L (ref 38–234)

## 2024-07-08 LAB — CBG MONITORING, ED
Glucose-Capillary: 337 mg/dL — ABNORMAL HIGH (ref 70–99)
Glucose-Capillary: 183 mg/dL — ABNORMAL HIGH (ref 70–99)

## 2024-07-08 LAB — I-STAT CG4 LACTIC ACID, ED
Lactic Acid, Venous: 2 mmol/L (ref 0.5–1.9)
Lactic Acid, Venous: 14.5 mmol/L (ref 0.5–1.9)
Lactic Acid, Venous: 1.1 mmol/L (ref 0.5–1.9)

## 2024-07-08 LAB — BETA-HYDROXYBUTYRIC ACID: Beta-Hydroxybutyric Acid: 2.36 mmol/L — ABNORMAL HIGH (ref 0.05–0.27)

## 2024-07-08 MED ORDER — SODIUM CHLORIDE 0.9 % IV SOLN
2.0000 g | Freq: Two times a day (BID) | INTRAVENOUS | Status: DC
  Administered 2024-07-09 – 2024-07-10 (×3): 2 g via INTRAVENOUS
  Filled 2024-07-08 (×3): qty 20

## 2024-07-08 MED ORDER — ONDANSETRON HCL 4 MG PO TABS: 4.0000 mg | ORAL_TABLET | Freq: Four times a day (QID) | ORAL | Status: DC | PRN

## 2024-07-08 MED ORDER — LACTATED RINGERS IV BOLUS
1000.0000 mL | INTRAVENOUS | Status: AC
  Administered 2024-07-08 (×2): 1000 mL via INTRAVENOUS

## 2024-07-08 MED ORDER — ACETAMINOPHEN 325 MG PO TABS: 650.0000 mg | ORAL_TABLET | Freq: Four times a day (QID) | ORAL | Status: DC | PRN

## 2024-07-08 MED ORDER — VANCOMYCIN HCL 1750 MG/350ML IV SOLN
1750.0000 mg | Freq: Once | INTRAVENOUS | Status: AC
  Administered 2024-07-09: 1750 mg via INTRAVENOUS
  Filled 2024-07-08: qty 350

## 2024-07-08 MED ORDER — PROPOFOL 10 MG/ML IV BOLUS
INTRAVENOUS | Status: DC | PRN
  Administered 2024-07-08 (×2): 20 mg via INTRAVENOUS
  Administered 2024-07-08: 30 mg via INTRAVENOUS

## 2024-07-08 MED ORDER — ENOXAPARIN SODIUM 30 MG/0.3ML IJ SOSY: 30.0000 mg | PREFILLED_SYRINGE | INTRAMUSCULAR | Status: DC

## 2024-07-08 MED ORDER — PROPOFOL 10 MG/ML IV BOLUS
200.0000 mg | Freq: Once | INTRAVENOUS | Status: AC
  Administered 2024-07-08: 30 mg via INTRAVENOUS
  Filled 2024-07-08: qty 20

## 2024-07-08 MED ORDER — ACETAMINOPHEN 650 MG RE SUPP: 650.0000 mg | Freq: Four times a day (QID) | RECTAL | Status: DC | PRN

## 2024-07-08 MED ORDER — INSULIN ASPART 100 UNIT/ML IJ SOLN: 10.0000 [IU] | Freq: Once | INTRAMUSCULAR | Status: DC

## 2024-07-08 MED ORDER — LACTATED RINGERS IV SOLN: INTRAVENOUS | Status: DC

## 2024-07-08 MED ORDER — VANCOMYCIN HCL 750 MG/150ML IV SOLN
750.0000 mg | Freq: Two times a day (BID) | INTRAVENOUS | Status: DC
  Administered 2024-07-09 (×2): 750 mg via INTRAVENOUS
  Filled 2024-07-08 (×3): qty 150

## 2024-07-08 MED ORDER — OXYCODONE-ACETAMINOPHEN 5-325 MG PO TABS
ORAL_TABLET | ORAL | Status: AC
  Filled 2024-07-08: qty 1

## 2024-07-08 MED ORDER — HALOPERIDOL LACTATE 5 MG/ML IJ SOLN: 5.0000 mg | Freq: Once | INTRAMUSCULAR | Status: DC

## 2024-07-08 MED ORDER — LORAZEPAM 2 MG/ML IJ SOLN: 1.0000 mg | Freq: Once | INTRAMUSCULAR | Status: DC

## 2024-07-08 MED ORDER — SODIUM CHLORIDE 0.9 % IV SOLN: 2.0000 g | INTRAVENOUS | Status: DC

## 2024-07-08 MED ORDER — ONDANSETRON HCL 4 MG/2ML IJ SOLN: 4.0000 mg | Freq: Four times a day (QID) | INTRAMUSCULAR | Status: DC | PRN

## 2024-07-08 MED ORDER — SODIUM CHLORIDE 0.9 % IV SOLN
1.0000 g | Freq: Once | INTRAVENOUS | Status: AC
  Administered 2024-07-08: 1 g via INTRAVENOUS
  Filled 2024-07-08: qty 10

## 2024-07-08 MED ORDER — PROPOFOL 10 MG/ML IV BOLUS
INTRAVENOUS | Status: DC | PRN
  Administered 2024-07-08 (×2): 40 mg via INTRAVENOUS

## 2024-07-08 MED ORDER — BISACODYL 5 MG PO TBEC: 5.0000 mg | DELAYED_RELEASE_TABLET | Freq: Every day | ORAL | Status: DC | PRN
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
## 2024-07-08 NOTE — ED Notes (Signed)
 CCMD Called

## 2024-07-08 NOTE — Sedation Documentation (Signed)
 Verbal consent over the phone by Dr. Patt from sister
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
## 2024-07-08 NOTE — ED Notes (Signed)
 Patient transported to CT

## 2024-07-08 NOTE — Progress Notes (Signed)
 Pharmacy Antibiotic Note  Yvonne Todd is a 73 y.o. female admitted on 07/08/2024 with concern for meningitis.  Pharmacy has been consulted for vancomycin dosing.  Plan: Vancomycin 1750 mg IV x 1, then 750 mg IV q 12h (target vancomycin trough 15-20) Monitor renal function, Cx/LP to narrow Vancomycin trough at steady state     Temp (24hrs), Avg:98.4 F (36.9 C), Min:98 F (36.7 C), Max:99.2 F (37.3 C)  Recent Labs  Lab 07/08/24 1533 07/08/24 1608 07/08/24 1814 07/08/24 1847  WBC 10.4  --   --   --   CREATININE 1.55* 1.30*  --   --   LATICACIDVEN  --  2.0* 14.5* 1.1    CrCl cannot be calculated (Unknown ideal weight.).    Allergies  Allergen Reactions   Fish Allergy Rash and Other (See Comments)    Some types of fish with iodine cause rashes   Iodine Rash   Penicillins Rash and Other (See Comments)    Has patient had a PCN reaction causing immediate rash, facial/tongue/throat swelling, SOB or lightheadedness with hypotension: yes Has patient had a PCN reaction causing severe rash involving mucus membranes or skin necrosis: no Has patient had a PCN reaction that required hospitalization: no Has patient had a PCN reaction occurring within the last 10 years: no If all of the above answers are NO, then may proceed with Cephalosporin use.    Tetracyclines & Related Rash    Dorn Poot, PharmD, [facility] Clinical Pharmacist ED Pharmacist Phone # [phone] 07/08/2024 10:59 PM
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
## 2024-07-09 ENCOUNTER — Encounter (HOSPITAL_COMMUNITY): Payer: Self-pay | Admitting: Internal Medicine

## 2024-07-09 ENCOUNTER — Inpatient Hospital Stay (HOSPITAL_COMMUNITY)

## 2024-07-09 DIAGNOSIS — E1169 Type 2 diabetes mellitus with other specified complication: Secondary | ICD-10-CM | POA: Diagnosis not present

## 2024-07-09 DIAGNOSIS — G9340 Encephalopathy, unspecified: Secondary | ICD-10-CM

## 2024-07-09 DIAGNOSIS — N179 Acute kidney failure, unspecified: Secondary | ICD-10-CM | POA: Diagnosis not present

## 2024-07-09 DIAGNOSIS — Z5181 Encounter for therapeutic drug level monitoring: Secondary | ICD-10-CM | POA: Diagnosis not present

## 2024-07-09 LAB — HEMOGLOBIN A1C
Hgb A1c MFr Bld: 6.7 % — ABNORMAL HIGH (ref 4.8–5.6)
Mean Plasma Glucose: 145.59 mg/dL

## 2024-07-09 LAB — COMPREHENSIVE METABOLIC PANEL WITH GFR
ALT: 22 U/L (ref 0–44)
AST: 17 U/L (ref 15–41)
Albumin: 3.3 g/dL — ABNORMAL LOW (ref 3.5–5.0)
Alkaline Phosphatase: 78 U/L (ref 38–126)
Anion gap: 15 (ref 5–15)
BUN: 69 mg/dL — ABNORMAL HIGH (ref 8–23)
CO2: 21 mmol/L — ABNORMAL LOW (ref 22–32)
Calcium: 9.1 mg/dL (ref 8.9–10.3)
Chloride: 104 mmol/L (ref 98–111)
Creatinine, Ser: 1.13 mg/dL — ABNORMAL HIGH (ref 0.44–1.00)
GFR, Estimated: 51 mL/min — ABNORMAL LOW (ref >60)
Glucose, Bld: 257 mg/dL — ABNORMAL HIGH (ref 70–99)
Potassium: 3.3 mmol/L — ABNORMAL LOW (ref 3.5–5.1)
Sodium: 140 mmol/L (ref 135–145)
Total Bilirubin: 0.8 mg/dL (ref 0.0–1.2)
Total Protein: 6.5 g/dL (ref 6.5–8.1)

## 2024-07-09 LAB — CBC
HCT: 39.7 % (ref 36.0–46.0)
Hemoglobin: 13.5 g/dL (ref 12.0–15.0)
MCH: 31 pg (ref 26.0–34.0)
MCHC: 34 g/dL (ref 30.0–36.0)
MCV: 91.1 fL (ref 80.0–100.0)
Platelets: 353 K/uL (ref 150–400)
RBC: 4.36 MIL/uL (ref 3.87–5.11)
RDW: 12.5 % (ref 11.5–15.5)
WBC: 10.7 K/uL — ABNORMAL HIGH (ref 4.0–10.5)
nRBC: 0 % (ref 0.0–0.2)

## 2024-07-09 LAB — MAGNESIUM: Magnesium: 2.7 mg/dL — ABNORMAL HIGH (ref 1.7–2.4)

## 2024-07-09 LAB — BETA-HYDROXYBUTYRIC ACID
Beta-Hydroxybutyric Acid: 1.82 mmol/L — ABNORMAL HIGH (ref 0.05–0.27)
Beta-Hydroxybutyric Acid: 2.33 mmol/L — ABNORMAL HIGH (ref 0.05–0.27)
Beta-Hydroxybutyric Acid: 0.73 mmol/L — ABNORMAL HIGH (ref 0.05–0.27)

## 2024-07-09 LAB — CBG MONITORING, ED
Glucose-Capillary: 266 mg/dL — ABNORMAL HIGH (ref 70–99)
Glucose-Capillary: 235 mg/dL — ABNORMAL HIGH (ref 70–99)
Glucose-Capillary: 303 mg/dL — ABNORMAL HIGH (ref 70–99)

## 2024-07-09 LAB — GLUCOSE, CAPILLARY
Glucose-Capillary: 151 mg/dL — ABNORMAL HIGH (ref 70–99)
Glucose-Capillary: 131 mg/dL — ABNORMAL HIGH (ref 70–99)

## 2024-07-09 LAB — I-STAT CG4 LACTIC ACID, ED: Lactic Acid, Venous: 1.1 mmol/L (ref 0.5–1.9)

## 2024-07-09 MED ORDER — INSULIN GLARGINE-YFGN 100 UNIT/ML ~~LOC~~ SOPN
10.0000 [IU] | PEN_INJECTOR | Freq: Once | SUBCUTANEOUS | Status: DC
  Filled 2024-07-09: qty 3

## 2024-07-09 MED ORDER — INSULIN GLARGINE-YFGN 100 UNIT/ML ~~LOC~~ SOLN
15.0000 [IU] | Freq: Two times a day (BID) | SUBCUTANEOUS | Status: DC
  Administered 2024-07-09 – 2024-07-10 (×3): 15 [IU] via SUBCUTANEOUS
  Filled 2024-07-09 (×5): qty 0.15

## 2024-07-09 MED ORDER — MELATONIN 5 MG PO TABS
5.0000 mg | ORAL_TABLET | Freq: Every day | ORAL | Status: DC
  Administered 2024-07-11 – 2024-07-12 (×2): 5 mg via ORAL
  Filled 2024-07-09 (×3): qty 1

## 2024-07-09 MED ORDER — INSULIN GLARGINE-YFGN 100 UNIT/ML ~~LOC~~ SOLN
10.0000 [IU] | Freq: Every day | SUBCUTANEOUS | Status: DC
  Filled 2024-07-09: qty 0.1

## 2024-07-09 MED ORDER — IRBESARTAN 150 MG PO TABS: 75.0000 mg | ORAL_TABLET | Freq: Every day | ORAL | Status: DC

## 2024-07-09 MED ORDER — ENOXAPARIN SODIUM 40 MG/0.4ML IJ SOSY
40.0000 mg | PREFILLED_SYRINGE | INTRAMUSCULAR | Status: DC
  Administered 2024-07-09: 40 mg via SUBCUTANEOUS
  Filled 2024-07-09: qty 0.4

## 2024-07-09 MED ORDER — LIDOCAINE HCL (PF) 1 % IJ SOLN
5.0000 mL | Freq: Once | INTRAMUSCULAR | Status: AC
  Administered 2024-07-09: 5 mL

## 2024-07-09 MED ORDER — SODIUM CHLORIDE 0.9 % IV SOLN: INTRAVENOUS | Status: DC

## 2024-07-09 MED ORDER — LIDOCAINE 1 % OPTIME INJ - NO CHARGE
5.0000 mL | Freq: Once | INTRAMUSCULAR | Status: AC
  Administered 2024-07-09: 5 mL via INTRADERMAL
  Filled 2024-07-09: qty 6

## 2024-07-09 MED ORDER — INSULIN GLARGINE-YFGN 100 UNIT/ML ~~LOC~~ SOLN
10.0000 [IU] | Freq: Two times a day (BID) | SUBCUTANEOUS | Status: DC
  Administered 2024-07-09: 10 [IU] via SUBCUTANEOUS
  Filled 2024-07-09 (×5): qty 0.1

## 2024-07-09 MED ORDER — INSULIN ASPART 100 UNIT/ML IJ SOLN: 0.0000 [IU] | Freq: Three times a day (TID) | INTRAMUSCULAR | Status: DC

## 2024-07-09 MED ORDER — HALOPERIDOL LACTATE 5 MG/ML IJ SOLN
5.0000 mg | Freq: Four times a day (QID) | INTRAMUSCULAR | Status: DC | PRN
Start: 2024-07-09 — End: 2024-07-13

## 2024-07-09 MED ORDER — INSULIN GLARGINE-YFGN 100 UNIT/ML ~~LOC~~ SOLN
10.0000 [IU] | Freq: Two times a day (BID) | SUBCUTANEOUS | Status: DC
  Filled 2024-07-09: qty 0.1

## 2024-07-09 MED ORDER — INSULIN GLARGINE-YFGN 100 UNIT/ML ~~LOC~~ SOLN
10.0000 [IU] | Freq: Once | SUBCUTANEOUS | Status: AC
  Administered 2024-07-09: 10 [IU] via SUBCUTANEOUS
  Filled 2024-07-09: qty 0.1

## 2024-07-09 MED ORDER — SODIUM CHLORIDE 0.9 % IV SOLN
2.0000 g | Freq: Four times a day (QID) | INTRAVENOUS | Status: DC
  Administered 2024-07-09 – 2024-07-10 (×3): 2 g via INTRAVENOUS
  Filled 2024-07-09 (×4): qty 2000

## 2024-07-09 MED ORDER — INSULIN ASPART 100 UNIT/ML IJ SOLN: 0.0000 [IU] | Freq: Every day | INTRAMUSCULAR | Status: DC

## 2024-07-09 MED ORDER — SODIUM CHLORIDE 0.9 % IV BOLUS
1000.0000 mL | Freq: Once | INTRAVENOUS | Status: AC
  Administered 2024-07-09: 1000 mL via INTRAVENOUS

## 2024-07-09 MED ORDER — POTASSIUM CHLORIDE CRYS ER 20 MEQ PO TBCR
40.0000 meq | EXTENDED_RELEASE_TABLET | Freq: Two times a day (BID) | ORAL | Status: AC
  Administered 2024-07-09: 40 meq via ORAL
  Filled 2024-07-09: qty 2

## 2024-07-09 MED ORDER — DEXTROSE 5 % IV SOLN
10.0000 mg/kg | Freq: Two times a day (BID) | INTRAVENOUS | Status: DC
  Administered 2024-07-09 – 2024-07-10 (×3): 900 mg via INTRAVENOUS
  Filled 2024-07-09 (×5): qty 18

## 2024-07-09 MED ORDER — LORAZEPAM 2 MG/ML IJ SOLN
1.0000 mg | Freq: Once | INTRAMUSCULAR | Status: AC
  Administered 2024-07-09: 1 mg via INTRAVENOUS
  Filled 2024-07-09: qty 1

## 2024-07-09 MED ORDER — INSULIN ASPART 100 UNIT/ML IJ SOLN: 3.0000 [IU] | Freq: Three times a day (TID) | INTRAMUSCULAR | Status: DC

## 2024-07-09 MED ORDER — INSULIN ASPART 100 UNIT/ML IJ SOLN
0.0000 [IU] | INTRAMUSCULAR | Status: DC
  Administered 2024-07-09: 11 [IU] via SUBCUTANEOUS
  Administered 2024-07-09: 3 [IU] via SUBCUTANEOUS
  Administered 2024-07-09: 2 [IU] via SUBCUTANEOUS
  Administered 2024-07-09: 5 [IU] via SUBCUTANEOUS
  Administered 2024-07-10: 3 [IU] via SUBCUTANEOUS
  Administered 2024-07-10: 2 [IU] via SUBCUTANEOUS
  Administered 2024-07-10 – 2024-07-11 (×2): 3 [IU] via SUBCUTANEOUS
  Administered 2024-07-11: 5 [IU] via SUBCUTANEOUS
  Administered 2024-07-11 – 2024-07-12 (×4): 3 [IU] via SUBCUTANEOUS
  Administered 2024-07-12 (×2): 2 [IU] via SUBCUTANEOUS
  Administered 2024-07-13: 5 [IU] via SUBCUTANEOUS
  Administered 2024-07-13: 2 [IU] via SUBCUTANEOUS
  Filled 2024-07-09 (×3): qty 1
  Filled 2024-07-09 (×2): qty 3
  Filled 2024-07-09 (×2): qty 1
  Filled 2024-07-09: qty 3
  Filled 2024-07-09: qty 2
  Filled 2024-07-09 (×2): qty 3
  Filled 2024-07-09: qty 2
  Filled 2024-07-09 (×2): qty 1
  Filled 2024-07-09: qty 3

## 2024-07-09 NOTE — Consult Note (Addendum)
 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 female with T2DM, COPD on 3LNC at [place], Asthma, obesity, who was admitted on 11/7 for altered mental status, found at home in bed in her own bodily fluids/excrement.  On admit, She was found febrile, temp of 104F, LA 2.0 mildly elevated cr of 1.3. wbc of 10.4 and plt at 469. BS elevated at 415. NCHCT unrevealing. CT abd/pelvis - unrevealing.  Infectious work up: ua unremarkable, no pyuria nor bactiuria. Blood cx ngtd at < 24hrs.  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  She was taken to IR for LP but only able to get 0.5 mL, unsuccessful due to agitation.  Past Medical History:  Diagnosis Date   Allergy    Iodine, Penicillin, Tetracycline.  Patient reports reaction of hives, rash, no sob, no wheezing    Arthritis    .Right knee, right hip   Asthma    COPD (chronic obstructive pulmonary disease) (HCC)    Depression    Past year depression not sleeping after fiancee deceased 2013-07-29   Diabetes (HCC)    Hyperlipidemia    Dx 03/2014   Hypertension    Dx 03/2014   Peripheral arterial disease    Pneumonia    hx of recently po antibiotics   Primary localized osteoarthritis of right knee     Allergies:  Allergies  Allergen Reactions   Fish Allergy Rash and Other (See Comments)    Some types of fish with iodine cause rashes   Iodine Rash   Penicillins Rash   Tetracyclines & Related Rash    Current antibiotics:   MEDICATIONS:  enoxaparin  (LOVENOX ) injection  40 mg Subcutaneous Q24H   insulin  aspart   0-15 Units Subcutaneous Q4H   insulin  glargine-yfgn  15 Units Subcutaneous BID   insulin  glargine-yfgn  10 Units Subcutaneous Once   melatonin  5 mg Oral QHS   potassium chloride   40 mEq Oral BID    Social History   Tobacco Use   Smoking status: Former    Current packs/day: 0.00    Average packs/day: 1.3 packs/day for 44.5 years (55.7 ttl pk-yrs)    Types: Cigarettes    Start date: 09/01/1973    Quit date: 03/19/2018    Years since quitting: 6.3   Smokeless tobacco: Never  Vaping Use   Vaping status: Never Used  Substance Use Topics   Alcohol  use: Yes    Alcohol /week: 2.0 standard drinks of alcohol     Types: 1 Cans of beer, 1 Shots of liquor per week    Comment: rarely   Drug use: No    Family History  Problem Relation Age of Onset   Diabetes Mother    Coronary artery disease Mother    Hypertension Mother    Vision loss Mother    Kidney disease Mother    Heart disease Mother        Mother hx of CAD with stent placement   Heart attack Mother    Breast cancer Sister    Cancer Sister 48  breast cancer   Stroke Sister        aneurysm only no stroke   Lung cancer Paternal Uncle        x 2 uncles   Emphysema Paternal Grandfather     Review of Systems -  Unable to obtain due to encephalopathy  OBJECTIVE: Temp:  [97.6 F (36.4 C)-99.2 F (37.3 C)] 97.8 F (36.6 C) (11/08 1220) Pulse Rate:  [81-114] 90 (11/08 1220) Resp:  [18-31] 20 (11/08 1220) BP: (94-151)/(60-137) 130/71 (11/08 1220) SpO2:  [92 %-100 %] 99 % (11/08 1220) Weight:  [89.8 kg] 89.8 kg (11/08 0634) Physical Exam  Constitutional: snoring. appears well-developed and well-nourished. No distress.  HENT: [place]/AT, PERRLA, no scleral icterus Cardiovascular: Normal rate, regular rhythm and normal heart sounds. Exam reveals no gallop and no friction rub.  No murmur heard.  Pulmonary/Chest: Effort normal and breath sounds normal. No respiratory distress.  has no wheezes.  Neck = supple, no nuchal  rigidity Abdominal: Soft. Bowel sounds are normal.  exhibits no distension. There is no tenderness.  Lymphadenopathy: no cervical adenopathy. No axillary adenopathy Neurological:no nuchal rigidity. No pain with moving extremities Skin: Skin is warm and dry. No rash noted. No erythema.    LABS: Results for orders placed or performed during the hospital encounter of 07/08/24 (from the past 48 hours)  Urinalysis, Routine w reflex microscopic -Urine, Catheterized     Status: Abnormal   Collection Time: 07/08/24  3:02 PM  Result Value Ref Range   Color, Urine YELLOW YELLOW   APPearance HAZY (A) CLEAR   Specific Gravity, Urine 1.022 1.005 - 1.030   pH 5.0 5.0 - 8.0   Glucose, UA >=500 (A) NEGATIVE mg/dL   Hgb urine dipstick SMALL (A) NEGATIVE   Bilirubin Urine NEGATIVE NEGATIVE   Ketones, ur 5 (A) NEGATIVE mg/dL   Protein, ur 30 (A) NEGATIVE mg/dL   Nitrite NEGATIVE NEGATIVE   Leukocytes,Ua NEGATIVE NEGATIVE   RBC / HPF 0-5 0 - 5 RBC/hpf   WBC, UA 0-5 0 - 5 WBC/hpf   Bacteria, UA NONE SEEN NONE SEEN   Squamous Epithelial / HPF 0-5 0 - 5 /HPF   Mucus PRESENT    Hyaline Casts, UA PRESENT     Comment: Performed at [facility], [address] [place], [place] [address]  Beta-hydroxybutyric acid     Status: Abnormal   Collection Time: 07/08/24  3:33 PM  Result Value Ref Range   Beta-Hydroxybutyric Acid 2.36 (H) 0.05 - 0.27 mmol/L    Comment: Performed at [facility], [address] [place], [place] [address]  CBC with Differential (PNL)     Status: Abnormal   Collection Time: 07/08/24  3:33 PM  Result Value Ref Range   WBC 10.4 4.0 - 10.5 K/uL   RBC 4.78 3.87 - 5.11 MIL/uL   Hemoglobin 14.7 12.0 - 15.0 g/dL   HCT 56.5 63.9 - 53.9 %   MCV 90.8 80.0 - 100.0 fL   MCH 30.8 26.0 - 34.0 pg   MCHC 33.9 30.0 - 36.0 g/dL   RDW 87.5 88.4 - 84.4 %   Platelets 469 (H) 150 - 400 K/uL   nRBC 0.0 0.0 - 0.2 %   Neutrophils Relative % 81 %   Neutro Abs 8.3 (H) 1.7 - 7.7 K/uL    Lymphocytes Relative 10 %   Lymphs Abs 1.1 0.7 - 4.0 K/uL   Monocytes Relative 9 %   Monocytes Absolute 0.9 0.1 - 1.0  K/uL   Eosinophils Relative 0 %   Eosinophils Absolute 0.0 0.0 - 0.5 K/uL   Basophils Relative 0 %   Basophils Absolute 0.0 0.0 - 0.1 K/uL   Immature Granulocytes 0 %   Abs Immature Granulocytes 0.03 0.00 - 0.07 K/uL    Comment: Performed at [facility], [address], [place], [place] [address]  CK     Status: None   Collection Time: 07/08/24  3:33 PM  Result Value Ref Range   Total CK 78 38 - 234 U/L    Comment: Performed at [facility], [address] [place], [place] [address]  Comprehensive metabolic panel     Status: Abnormal   Collection Time: 07/08/24  3:33 PM  Result Value Ref Range   Sodium 138 135 - 145 mmol/L   Potassium 3.7 3.5 - 5.1 mmol/L   Chloride 101 98 - 111 mmol/L   CO2 18 (L) 22 - 32 mmol/L   Glucose, Bld 415 (H) 70 - 99 mg/dL    Comment: Glucose reference range applies only to samples taken after fasting for at least 8 hours.   BUN 102 (H) 8 - 23 mg/dL   Creatinine, Ser 8.44 (H) 0.44 - 1.00 mg/dL   Calcium  8.5 (L) 8.9 - 10.3 mg/dL   Total Protein 7.1 6.5 - 8.1 g/dL   Albumin  3.6 3.5 - 5.0 g/dL   AST 19 15 - 41 U/L   ALT 21 0 - 44 U/L   Alkaline Phosphatase 85 38 - 126 U/L   Total Bilirubin 1.0 0.0 - 1.2 mg/dL   GFR, Estimated 35 (L) >60 mL/min    Comment: (NOTE) Calculated using the CKD-EPI Creatinine Equation (2021)    Anion gap 19 (H) 5 - 15    Comment: Performed at [facility], [address] [place], [place] [address]  CBG monitoring, ED     Status: Abnormal   Collection Time: 07/08/24  3:42 PM  Result Value Ref Range   Glucose-Capillary 337 (H) 70 - 99 mg/dL    Comment: Glucose reference range applies only to samples taken after fasting for at least 8 hours.  Blood culture (routine x 2)     Status: None (Preliminary result)   Collection Time: 07/08/24  3:48 PM   Specimen: BLOOD RIGHT FOREARM  Result  Value Ref Range   Specimen Description BLOOD RIGHT FOREARM    Special Requests      BOTTLES DRAWN AEROBIC AND ANAEROBIC Blood Culture results may not be optimal due to an inadequate volume of blood received in culture bottles   Culture      NO GROWTH < 24 HOURS Performed at [facility], [address] [place], [place] [address]    Report Status PENDING   I-Stat Venous Blood Gas, ED     Status: Abnormal   Collection Time: 07/08/24  4:07 PM  Result Value Ref Range   pH, Ven 7.343 7.25 - 7.43   pCO2, Ven 38.7 (L) 44 - 60 mmHg   pO2, Ven 115 (H) 32 - 45 mmHg   Bicarbonate 21.0 20.0 - 28.0 mmol/L   TCO2 22 22 - 32 mmol/L   O2 Saturation 98 %   Acid-base deficit 4.0 (H) 0.0 - 2.0 mmol/L   Sodium 141 135 - 145 mmol/L   Potassium 3.3 (L) 3.5 - 5.1 mmol/L   Calcium , Ion 1.03 (L) 1.15 - 1.40 mmol/L   HCT 38.0 36.0 - 46.0 %  Hemoglobin 12.9 12.0 - 15.0 g/dL   Sample type VENOUS   I-Stat Chem 8, ED     Status: Abnormal   Collection Time: 07/08/24  4:08 PM  Result Value Ref Range   Sodium 142 135 - 145 mmol/L   Potassium 3.4 (L) 3.5 - 5.1 mmol/L   Chloride 108 98 - 111 mmol/L   BUN 95 (H) 8 - 23 mg/dL   Creatinine, Ser 8.69 (H) 0.44 - 1.00 mg/dL   Glucose, Bld 601 (H) 70 - 99 mg/dL    Comment: Glucose reference range applies only to samples taken after fasting for at least 8 hours.   Calcium , Ion 1.05 (L) 1.15 - 1.40 mmol/L   TCO2 21 (L) 22 - 32 mmol/L   Hemoglobin 13.9 12.0 - 15.0 g/dL   HCT 58.9 63.9 - 53.9 %  I-Stat Lactic Acid, ED     Status: Abnormal   Collection Time: 07/08/24  4:08 PM  Result Value Ref Range   Lactic Acid, Venous 2.0 (HH) 0.5 - 1.9 mmol/L  Blood culture (routine x 2)     Status: None (Preliminary result)   Collection Time: 07/08/24  5:50 PM   Specimen: BLOOD RIGHT HAND  Result Value Ref Range   Specimen Description BLOOD RIGHT HAND    Special Requests      BOTTLES DRAWN AEROBIC AND ANAEROBIC Blood Culture results may not be optimal due to an  inadequate volume of blood received in culture bottles   Culture      NO GROWTH < 24 HOURS Performed at [facility], [address], [name], [place] [address]    Report Status PENDING   CBG monitoring, ED     Status: Abnormal   Collection Time: 07/08/24  5:55 PM  Result Value Ref Range   Glucose-Capillary 183 (H) 70 - 99 mg/dL    Comment: Glucose reference range applies only to samples taken after fasting for at least 8 hours.  I-Stat Lactic Acid, ED     Status: Abnormal   Collection Time: 07/08/24  6:14 PM  Result Value Ref Range   Lactic Acid, Venous 14.5 (HH) 0.5 - 1.9 mmol/L   Comment NOTIFIED PHYSICIAN   I-Stat CG4 Lactic Acid     Status: None   Collection Time: 07/08/24  6:47 PM  Result Value Ref Range   Lactic Acid, Venous 1.1 0.5 - 1.9 mmol/L  POC CBG, ED     Status: Abnormal   Collection Time: 07/09/24  4:10 AM  Result Value Ref Range   Glucose-Capillary 266 (H) 70 - 99 mg/dL    Comment: Glucose reference range applies only to samples taken after fasting for at least 8 hours.  Beta-hydroxybutyric acid     Status: Abnormal   Collection Time: 07/09/24  4:14 AM  Result Value Ref Range   Beta-Hydroxybutyric Acid 1.82 (H) 0.05 - 0.27 mmol/L    Comment: Performed at [facility], [address], [place], [place] [address]  Magnesium      Status: Abnormal   Collection Time: 07/09/24  4:14 AM  Result Value Ref Range   Magnesium  2.7 (H) 1.7 - 2.4 mg/dL    Comment: Performed at [facility], [address], [place], [place] [address]  CBC     Status: Abnormal   Collection Time: 07/09/24  4:14 AM  Result Value Ref Range   WBC 10.7 (H) 4.0 - 10.5 K/uL   RBC 4.36 3.87 - 5.11 MIL/uL   Hemoglobin  13.5 12.0 - 15.0 g/dL   HCT 60.2 63.9 - 53.9 %   MCV 91.1 80.0 - 100.0 fL   MCH 31.0 26.0 - 34.0 pg   MCHC 34.0 30.0 - 36.0 g/dL   RDW 87.4 88.4 - 84.4 %   Platelets 353 150 - 400 K/uL   nRBC 0.0 0.0 - 0.2 %    Comment: Performed at [facility], [address], [place], [place] [address]  Comprehensive metabolic panel     Status: Abnormal   Collection Time: 07/09/24  4:14 AM  Result Value Ref Range   Sodium 140 135 - 145 mmol/L   Potassium 3.3 (L) 3.5 - 5.1 mmol/L   Chloride 104 98 - 111 mmol/L   CO2 21 (L) 22 - 32 mmol/L   Glucose, Bld 257 (H) 70 - 99 mg/dL    Comment: Glucose reference range applies only to samples taken after fasting for at least 8 hours.   BUN 69 (H) 8 - 23 mg/dL   Creatinine, Ser 8.86 (H) 0.44 - 1.00 mg/dL   Calcium  9.1 8.9 - 10.3 mg/dL   Total Protein 6.5 6.5 - 8.1 g/dL   Albumin  3.3 (L) 3.5 - 5.0 g/dL   AST 17 15 - 41 U/L   ALT 22 0 - 44 U/L   Alkaline Phosphatase 78 38 - 126 U/L   Total Bilirubin 0.8 0.0 - 1.2 mg/dL   GFR, Estimated 51 (L) >60 mL/min    Comment: (NOTE) Calculated using the CKD-EPI Creatinine Equation (2021)    Anion gap 15 5 - 15    Comment: Performed at [facility], [address], [place], [place] [address]  I-Stat CG4 Lactic Acid     Status: None   Collection Time: 07/09/24  4:28 AM  Result Value Ref Range   Lactic Acid, Venous 1.1 0.5 - 1.9 mmol/L  Hemoglobin A1c     Status: Abnormal   Collection Time: 07/09/24  7:44 AM  Result Value Ref Range   Hgb A1c MFr Bld 6.7 (H) 4.8 - 5.6 %    Comment: (NOTE) Diagnosis of Diabetes The following HbA1c ranges recommended by the American Diabetes Association (ADA) may be used as an aid in the diagnosis of diabetes mellitus.  Hemoglobin             Suggested A1C NGSP%              Diagnosis  <5.7                   Non Diabetic  5.7-6.4                Pre-Diabetic  >6.4                   Diabetic  <7.0                   Glycemic control for                       adults with diabetes.     Mean Plasma Glucose 145.59 mg/dL    Comment: Performed at [facility], [address] [place], [place] [address]  Beta-hydroxybutyric acid     Status: Abnormal   Collection Time: 07/09/24  7:53 AM  Result Value Ref Range    Beta-Hydroxybutyric Acid 2.33 (H) 0.05 - 0.27 mmol/L    Comment: Performed at [facility], [address] [place], [place] [address]  CBG monitoring, ED     Status: Abnormal   Collection Time: 07/09/24  8:09 AM  Result Value Ref Range   Glucose-Capillary 235 (H) 70 - 99 mg/dL    Comment: Glucose reference range applies only to samples taken after fasting for at least 8 hours.  CBG monitoring, ED     Status: Abnormal   Collection Time: 07/09/24 11:13 AM  Result Value Ref Range   Glucose-Capillary 303 (H) 70 - 99 mg/dL    Comment: Glucose reference range applies only to samples taken after fasting for at least 8 hours.  Beta-hydroxybutyric acid     Status: Abnormal   Collection Time: 07/09/24 12:42 PM  Result Value Ref Range   Beta-Hydroxybutyric Acid 0.73 (H) 0.05 - 0.27 mmol/L    Comment: Performed at [facility], [address] [place], [place] [address]    MICRO:  IMAGING: CT ABDOMEN PELVIS WO CONTRAST Result Date: 07/08/2024 EXAM: CT ABDOMEN AND PELVIS WITHOUT CONTRAST 07/08/2024 08:12:58 PM TECHNIQUE: CT of the abdomen and pelvis was performed without the administration of intravenous contrast. Multiplanar reformatted images are provided for review. Automated exposure control, iterative reconstruction, and/or weight-based adjustment of the mA/kV was utilized to reduce the radiation dose to as low as reasonably achievable. COMPARISON: None available. CLINICAL HISTORY: Abdominal pain. FINDINGS: LOWER CHEST: Prominent bases are well aerated with emphysematous changes. Scattered small pulmonary nodules are noted particularly in the left lower lobe. The largest of these measures 6 mm best seen on image number 6 of series 5. LIVER: The liver is within normal limits. GALLBLADDER AND BILE DUCTS: Cholelithiasis is noted without complicating factors. No biliary ductal dilatation. SPLEEN: The spleen is unremarkable. PANCREAS: The pancreas is unremarkable. ADRENAL GLANDS: The adrenal  glands are within normal limits. KIDNEYS, URETERS AND BLADDER: The kidneys are well visualized bilaterally. No renal calculi or obstructive changes are seen. The bladder is well distended. GI AND BOWEL: Stomach demonstrates no acute abnormality. Scattered diverticular changes of the colon are noted without diverticulitis. The appendix is within normal limits. The small bowel is unremarkable. PERITONEUM AND RETROPERITONEUM: No free fluid is noted. No free air. VASCULATURE: Aortic calcifications are seen without aneurysmal dilatation. LYMPH NODES: No lymphadenopathy. REPRODUCTIVE ORGANS: Uterus is within normal limits. No adnexal mass is seen. BONES AND SOFT TISSUES: Degenerative changes of the lumbar spine are seen. IMPRESSION: 1. No acute findings in the abdomen or pelvis. 2. Scattered small pulmonary nodules, largest measuring 6 mm in the left lower lobe. Recommend non-contrast chest CT at 36 months, then consider non-contrast chest CT at 1824 months for multiple nodules with most suspicious 6 mm per Fleischner Society Guidelines. 3. Pulmonary emphysema. Recommend consideration for evaluation for a low-dose CT lung cancer screening program for eligible patients aged 31. 4. Cholelithiasis without complicating factors. 5. Scattered diverticular changes of the colon without diverticulitis. Electronically signed by: Oneil Devonshire MD 07/08/2024 08:27 PM EST RP Workstation: [id number]   CT Head Wo Contrast Result Date: 07/08/2024 EXAM: CT HEAD WITHOUT CONTRAST 07/08/2024 05:35:50 PM TECHNIQUE: CT of the head was performed without the administration of intravenous contrast. Automated exposure control, iterative reconstruction, and/or weight based adjustment of the mA/kV was utilized to reduce the radiation dose to as low as reasonably achievable. COMPARISON: None available. CLINICAL HISTORY: Mental status change, unknown cause. FINDINGS: BRAIN AND VENTRICLES: No acute hemorrhage. No evidence of acute infarct. No  hydrocephalus. No extra-axial collection. No mass effect or midline shift. Mild chronic microangiopathic  changes. ORBITS: No acute abnormality. SINUSES: No acute abnormality. SOFT TISSUES AND SKULL: No acute soft tissue abnormality. No skull fracture. Small right mastoid air cell effusion. IMPRESSION: 1. No acute intracranial abnormality. Electronically signed by: Norman Gatlin MD 07/08/2024 06:08 PM EST RP Workstation: [id number]   DG Chest Portable 1 View Result Date: 07/08/2024 EXAM: 1 VIEW(S) XRAY OF THE CHEST 07/08/2024 03:20:00 PM COMPARISON: Chest radiograph 01/13/2024. CLINICAL HISTORY: SOB, sepsis. FINDINGS: LUNGS AND PLEURA: No focal pulmonary opacity. No pulmonary edema. No pleural effusion. No pneumothorax. HEART AND MEDIASTINUM: Aortic atherosclerosis. Stable cardiomediastinal silhouette. BONES AND SOFT TISSUES: Degenerative changes of the bilateral glenohumeral joints. No acute osseous abnormality. IMPRESSION: 1. No acute cardiopulmonary findings. 2. Aortic atherosclerosis. Electronically signed by: Harrietta Sherry MD 07/08/2024 04:25 PM EST RP Workstation: [id number]    HISTORICAL MICRO/IMAGING  Assessment/Plan:  73yo F with T2DM, COPD admitted with fever, altered mental status in the setting of BS in 400s. Blood sugars now under normal range but patient still encephalopathic. 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 - recommend to get brain mri w wo contrast - would add ampicillin in addition to her current regimen - keep iv fluids in the setting of acyclovir to minimize aki - await to see if any cell count obtained from the 0.31mL drawn. May need to reattempt LP tomorrow.  Acute kidney injury - anticipate to improve .please keep maintenance IVF.   Therapeutic drug monitoring/toxicity = will dose adjust vancomycin to her renal function  Hx of penicillin rash documented in 2015 = given concern for listeria as part of her presentation, will still  start penicillin, watch that she tolerates it. If develops rash, then we will stop abtx. If young adult pcn reaction, it is likely that she can tolerate it in her 74s  Keep on droplet precautions.  Montie FURY Luiz MD MPH [facility] [phone]   T2DM = per primary team to keep on insulin  for the time being since she is not eating

## 2024-07-09 NOTE — Progress Notes (Signed)
 Pharmacy Antimicrobial Note  Yvonne Todd is a 73 y.o. 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  Pharmacy has been consulted for Acyclovir dosing.  Plan: Acyclovir 10 mg/kg IV q12h  Continue maintenance fluids   Height: 5' 2 (157.5 cm) Weight: 89.8 kg (197 lb 15.6 oz) IBW/kg (Calculated) : 50.1  Temp (24hrs), Avg:98.3 F (36.8 C), Min:98 F (36.7 C), Max:99.2 F (37.3 C)  Recent Labs  Lab 07/08/24 1533 07/08/24 1608 07/08/24 1814 07/08/24 1847 07/09/24 0414 07/09/24 0428  WBC 10.4  --   --   --  10.7*  --   CREATININE 1.55* 1.30*  --   --  1.13*  --   LATICACIDVEN  --  2.0* 14.5* 1.1  --  1.1    Estimated Creatinine Clearance: 45.5 mL/min (A) (by C-G formula based on SCr of 1.13 mg/dL (H)).    Allergies  Allergen Reactions   Fish Allergy Rash and Other (See Comments)    Some types of fish with iodine cause rashes   Iodine Rash   Penicillins Rash and Other (See Comments)    Has patient had a PCN reaction causing immediate rash, facial/tongue/throat swelling, SOB or lightheadedness with hypotension: yes Has patient had a PCN reaction causing severe rash involving mucus membranes or skin necrosis: no Has patient had a PCN reaction that required hospitalization: no Has patient had a PCN reaction occurring within the last 10 years: no If all of the above answers are NO, then may proceed with Cephalosporin use.    Tetracyclines & Related Rash    Antimicrobials this admission: Vancomycin 11/8  Ceftriaxone  11/8   Microbiology results: 11/8 BCx:   Thank you for allowing pharmacy to be a part of this patient's care.  Mable Lashley M Gregg Holster 07/09/2024 9:06 AM

## 2024-07-09 NOTE — Plan of Care (Signed)
   Problem: Clinical Measurements: Goal: Will remain free from infection Outcome: Progressing Goal: Diagnostic test results will improve Outcome: Progressing

## 2024-07-09 NOTE — Procedures (Signed)
 PROCEDURE SUMMARY:  Successful fluoroscopic guided lumbar puncture. However, patient remained agitated, and LP had to be aborted. Only 0.5 ML of CSF collected.  No immediate complications.  Pt tolerated well.   EBL = none  Please see full dictation in imaging section of Epic for procedure details.

## 2024-07-09 NOTE — Progress Notes (Signed)
 [facility] PROGRESS NOTE    Progress Note  Yvonne Todd  FMW:[id number] DOB: 07/15/1951 DOA: 07/08/2024 PCP: Rollene Almarie LABOR, MD     Brief Narrative:   Yvonne Todd is an 73 y.o. 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  In the ED she was afebrile white blood cell count of 10 blood glucose of 400, lactic acid of 2 and an acute renal failure.  CT of the head showed no acute findings.  CT scan of the abdomen pelvis showed scattered pulmonary nodules in the left lower lobe, emphysema and 6 mm left lower lobe nodule   Assessment/Plan:   Acute metabolic encephalopathy: She has remained afebrile with no leukocytosis.  Chest x-ray showed no infiltrates, CT scan of the abdomen pelvis showed no acute findings.  Did show scattered small pulmonary nodules the largest 6 mm in the left lower lobe some pulmonary emphysema.  LFTs are unremarkable. CT scan of the head showed no acute findings. I am concerned about the cause of her encephalopathy could be possibly DKA plus or minus encephalitis. Blood cultures were sent she was started on IV fluids. The ED attempted an LP which could not be obtained. IR consulted for LP. Was started empirically on acyclovir Vanco and Rocephin .  Possibly DKA type 2 diabetes with complication (HCC)/hyperglycemia: Anion gap of 19 blood glucose greater than 250 and acute kidney injury. Started on IV fluids, will start on long-acting insulin  and sliding scale insulin . After IV fluids her anion gap is improved. Her last hemoglobin A1c was 7.6, her sister relates that she normally takes her diabetes medication.  Acute kidney injury: Baseline creatinine less than 1 likely prerenal azotemia started on IV fluids hold ACE inhibitor and diuretic therapy. Recheck basic metabolic panel in the  morning.  Hypokalemia: Likely due to acidosis which is now corrected, replete orally recheck in the morning.  Thrombocytosis: Likely reactive.  Now resolved.    DVT prophylaxis: lovenox   Family Communication:sister Status is: Inpatient Remains inpatient appropriate because: Acute metabolic encephalopathy    Code Status:     Code Status Orders  (From admission, onward)           Start     Ordered   07/08/24 1953  Full code  Continuous       Question:  By:  Answer:  Consent: discussion documented in EHR   07/08/24 1953           Code Status History     Date Active Date Inactive Code Status Order ID Comments User Context   02/02/2018 1804 02/06/2018 1641 Full Code [id number]  Briana Elgin LABOR, MD Inpatient   10/15/2017 2324 10/17/2017 2033 Full Code [id number]  Alm Maxwell LABOR, MD ED   04/13/2017 2142 04/15/2017 1630 Full Code [id number]  Silvester Ales, MD Inpatient   05/23/2016 1804 05/31/2016 1748 Full Code [id number]  Briana Elgin LABOR, MD Inpatient   06/18/2015 2226 06/23/2015 1901 Full Code [id number]  Juvenal Harlene PENNER, DO Inpatient   07/03/2014 1425 07/07/2014 0009 Full Code [id number]  Donata Robbi JINNY DEVONNA Inpatient   03/19/2014 1828 03/20/2014 1937 Full Code [id number]  Jeannett Liming, DO Inpatient      Advance Directive Documentation    Flowsheet Row Most Recent Value  Type of Advance Directive Healthcare Power of Attorney  Pre-existing out of facility DNR  order (yellow form or pink MOST form) --  MOST Form in Place? --      IV Access:   Peripheral IV   Procedures and diagnostic studies:   CT ABDOMEN PELVIS WO CONTRAST Result Date: 07/08/2024 EXAM: CT ABDOMEN AND PELVIS WITHOUT CONTRAST 07/08/2024 08:12:58 PM TECHNIQUE: CT of the abdomen and pelvis was performed without the administration of intravenous contrast. Multiplanar reformatted images are provided for review. Automated exposure control, iterative reconstruction, and/or weight-based adjustment  of the mA/kV was utilized to reduce the radiation dose to as low as reasonably achievable. COMPARISON: None available. CLINICAL HISTORY: Abdominal pain. FINDINGS: LOWER CHEST: Prominent bases are well aerated with emphysematous changes. Scattered small pulmonary nodules are noted particularly in the left lower lobe. The largest of these measures 6 mm best seen on image number 6 of series 5. LIVER: The liver is within normal limits. GALLBLADDER AND BILE DUCTS: Cholelithiasis is noted without complicating factors. No biliary ductal dilatation. SPLEEN: The spleen is unremarkable. PANCREAS: The pancreas is unremarkable. ADRENAL GLANDS: The adrenal glands are within normal limits. KIDNEYS, URETERS AND BLADDER: The kidneys are well visualized bilaterally. No renal calculi or obstructive changes are seen. The bladder is well distended. GI AND BOWEL: Stomach demonstrates no acute abnormality. Scattered diverticular changes of the colon are noted without diverticulitis. The appendix is within normal limits. The small bowel is unremarkable. PERITONEUM AND RETROPERITONEUM: No free fluid is noted. No free air. VASCULATURE: Aortic calcifications are seen without aneurysmal dilatation. LYMPH NODES: No lymphadenopathy. REPRODUCTIVE ORGANS: Uterus is within normal limits. No adnexal mass is seen. BONES AND SOFT TISSUES: Degenerative changes of the lumbar spine are seen. IMPRESSION: 1. No acute findings in the abdomen or pelvis. 2. Scattered small pulmonary nodules, largest measuring 6 mm in the left lower lobe. Recommend non-contrast chest CT at 36 months, then consider non-contrast chest CT at 1824 months for multiple nodules with most suspicious 6 mm per Fleischner Society Guidelines. 3. Pulmonary emphysema. Recommend consideration for evaluation for a low-dose CT lung cancer screening program for eligible patients aged 91. 4. Cholelithiasis without complicating factors. 5. Scattered diverticular changes of the colon without  diverticulitis. Electronically signed by: Oneil Devonshire MD 07/08/2024 08:27 PM EST RP Workstation: [id number]   CT Head Wo Contrast Result Date: 07/08/2024 EXAM: CT HEAD WITHOUT CONTRAST 07/08/2024 05:35:50 PM TECHNIQUE: CT of the head was performed without the administration of intravenous contrast. Automated exposure control, iterative reconstruction, and/or weight based adjustment of the mA/kV was utilized to reduce the radiation dose to as low as reasonably achievable. COMPARISON: None available. CLINICAL HISTORY: Mental status change, unknown cause. FINDINGS: BRAIN AND VENTRICLES: No acute hemorrhage. No evidence of acute infarct. No hydrocephalus. No extra-axial collection. No mass effect or midline shift. Mild chronic microangiopathic changes. ORBITS: No acute abnormality. SINUSES: No acute abnormality. SOFT TISSUES AND SKULL: No acute soft tissue abnormality. No skull fracture. Small right mastoid air cell effusion. IMPRESSION: 1. No acute intracranial abnormality. Electronically signed by: Norman Gatlin MD 07/08/2024 06:08 PM EST RP Workstation: [id number]   DG Chest Portable 1 View Result Date: 07/08/2024 EXAM: 1 VIEW(S) XRAY OF THE CHEST 07/08/2024 03:20:00 PM COMPARISON: Chest radiograph 01/13/2024. CLINICAL HISTORY: SOB, sepsis. FINDINGS: LUNGS AND PLEURA: No focal pulmonary opacity. No pulmonary edema. No pleural effusion. No pneumothorax. HEART AND MEDIASTINUM: Aortic atherosclerosis. Stable cardiomediastinal silhouette. BONES AND SOFT TISSUES: Degenerative changes of the bilateral glenohumeral joints. No acute osseous abnormality. IMPRESSION: 1. No acute cardiopulmonary findings. 2. Aortic atherosclerosis. Electronically signed by: [employer]  MD 07/08/2024 04:25 PM EST RP Workstation: [id number]     Medical Consultants:   None.   Subjective:    Avel LILLETTE Daring no complaints this morning slow to respond and confused  Objective:    Vitals:   07/08/24 2158 07/09/24 0500  07/09/24 0554 07/09/24 0634  BP: 127/60 103/68    Pulse: (!) 111 93    Resp: 19 20    Temp:   98 F (36.7 C)   TempSrc:   Oral   SpO2: 100% 92%    Weight:    89.8 kg  Height:    5' 2 (1.575 m)   SpO2: 92 %   Intake/Output Summary (Last 24 hours) at 07/09/2024 0735 Last data filed at 07/09/2024 0528 Gross per 24 hour  Intake 1200 ml  Output 300 ml  Net 900 ml   Filed Weights   07/09/24 0634  Weight: 89.8 kg    Exam: General exam: In no acute distress. Respiratory system: Good air movement and clear to auscultation. Cardiovascular system: S1 & S2 heard, RRR. No JVD. Gastrointestinal system: Abdomen is nondistended, soft and nontender.  Central nervous system: Alert and oriented only to person able to follow commands appears nonfocal on physical exam Extremities: No pedal edema. Skin: No rashes, lesions or ulcers Psychiatry: No judgment or insight of medical condition.   Data Reviewed:    Labs: Basic Metabolic Panel: Recent Labs  Lab 07/08/24 1533 07/08/24 1607 07/08/24 1608 07/09/24 0414  NA 138 141 142 140  K 3.7 3.3* 3.4* 3.3*  CL 101  --  108 104  CO2 18*  --   --  21*  GLUCOSE 415*  --  398* 257*  BUN 102*  --  95* 69*  CREATININE 1.55*  --  1.30* 1.13*  CALCIUM  8.5*  --   --  9.1  MG  --   --   --  2.7*   GFR Estimated Creatinine Clearance: 45.5 mL/min (A) (by C-G formula based on SCr of 1.13 mg/dL (H)). Liver Function Tests: Recent Labs  Lab 07/08/24 1533 07/09/24 0414  AST 19 17  ALT 21 22  ALKPHOS 85 78  BILITOT 1.0 0.8  PROT 7.1 6.5  ALBUMIN  3.6 3.3*   No results for input(s): LIPASE, AMYLASE in the last 168 hours. No results for input(s): AMMONIA in the last 168 hours. Coagulation profile No results for input(s): INR, PROTIME in the last 168 hours. COVID-19 Labs  No results for input(s): DDIMER, FERRITIN, LDH, CRP in the last 72 hours.  Lab Results  Component Value Date   SARSCOV2NAA NEGATIVE 01/13/2024     CBC: Recent Labs  Lab 07/08/24 1533 07/08/24 1607 07/08/24 1608 07/09/24 0414  WBC 10.4  --   --  10.7*  NEUTROABS 8.3*  --   --   --   HGB 14.7 12.9 13.9 13.5  HCT 43.4 38.0 41.0 39.7  MCV 90.8  --   --  91.1  PLT 469*  --   --  353   Cardiac Enzymes: Recent Labs  Lab 07/08/24 1533  CKTOTAL 78   BNP (last 3 results) No results for input(s): PROBNP in the last 8760 hours. CBG: Recent Labs  Lab 07/08/24 1542 07/08/24 1755 07/09/24 0410  GLUCAP 337* 183* 266*   D-Dimer: No results for input(s): DDIMER in the last 72 hours. Hgb A1c: No results for input(s): HGBA1C in the last 72 hours. Lipid Profile: No results for input(s): CHOL, HDL, LDLCALC, TRIG, CHOLHDL, LDLDIRECT  in the last 72 hours. Thyroid  function studies: No results for input(s): TSH, T4TOTAL, T3FREE, THYROIDAB in the last 72 hours.  Invalid input(s): FREET3 Anemia work up: No results for input(s): VITAMINB12, FOLATE, FERRITIN, TIBC, IRON, RETICCTPCT in the last 72 hours. Sepsis Labs: Recent Labs  Lab 07/08/24 1533 07/08/24 1608 07/08/24 1814 07/08/24 1847 07/09/24 0414 07/09/24 0428  WBC 10.4  --   --   --  10.7*  --   LATICACIDVEN  --  2.0* 14.5* 1.1  --  1.1   Microbiology No results found for this or any previous visit (from the past 240 hours).   Medications:    enoxaparin  (LOVENOX ) injection  30 mg Subcutaneous Q24H   insulin  aspart  0-9 Units Subcutaneous TID WC   irbesartan  75 mg Oral Daily   Continuous Infusions:  cefTRIAXone  (ROCEPHIN )  IV Stopped (07/09/24 0528)   lactated ringers  Stopped (07/09/24 0528)   vancomycin        LOS: 1 day   Erle Odell Castor  [facility]  07/09/2024, 7:35 AM

## 2024-07-10 ENCOUNTER — Inpatient Hospital Stay (HOSPITAL_COMMUNITY)

## 2024-07-10 DIAGNOSIS — E118 Type 2 diabetes mellitus with unspecified complications: Secondary | ICD-10-CM | POA: Diagnosis not present

## 2024-07-10 DIAGNOSIS — G9340 Encephalopathy, unspecified: Secondary | ICD-10-CM | POA: Diagnosis not present

## 2024-07-10 DIAGNOSIS — J9611 Chronic respiratory failure with hypoxia: Secondary | ICD-10-CM | POA: Diagnosis not present

## 2024-07-10 LAB — CSF CELL COUNT WITH DIFFERENTIAL
Eosinophils, CSF: 1 % (ref 0–1)
Lymphs, CSF: 21 % — ABNORMAL LOW (ref 40–80)
Monocyte-Macrophage-Spinal Fluid: 4 % — ABNORMAL LOW (ref 15–45)
RBC Count, CSF: 22275 /mm3 — ABNORMAL HIGH
Segmented Neutrophils-CSF: 74 % — ABNORMAL HIGH (ref 0–6)
Tube #: 1
WBC, CSF: 18 /mm3 (ref 0–5)

## 2024-07-10 LAB — AMMONIA: Ammonia: 20 umol/L (ref 9–35)

## 2024-07-10 LAB — MENINGITIS/ENCEPHALITIS PANEL (CSF)

## 2024-07-10 LAB — CBC
HCT: 36.9 % (ref 36.0–46.0)
Hemoglobin: 12.3 g/dL (ref 12.0–15.0)
MCH: 30.7 pg (ref 26.0–34.0)
MCHC: 33.3 g/dL (ref 30.0–36.0)
MCV: 92 fL (ref 80.0–100.0)
Platelets: 314 K/uL (ref 150–400)
RBC: 4.01 MIL/uL (ref 3.87–5.11)
RDW: 12.5 % (ref 11.5–15.5)
WBC: 8.6 K/uL (ref 4.0–10.5)
nRBC: 0 % (ref 0.0–0.2)

## 2024-07-10 LAB — GLUCOSE, CAPILLARY
Glucose-Capillary: 111 mg/dL — ABNORMAL HIGH (ref 70–99)
Glucose-Capillary: 113 mg/dL — ABNORMAL HIGH (ref 70–99)
Glucose-Capillary: 115 mg/dL — ABNORMAL HIGH (ref 70–99)
Glucose-Capillary: 128 mg/dL — ABNORMAL HIGH (ref 70–99)
Glucose-Capillary: 166 mg/dL — ABNORMAL HIGH (ref 70–99)
Glucose-Capillary: 170 mg/dL — ABNORMAL HIGH (ref 70–99)

## 2024-07-10 LAB — COMPREHENSIVE METABOLIC PANEL WITH GFR
ALT: 25 U/L (ref 0–44)
AST: 20 U/L (ref 15–41)
Albumin: 2.7 g/dL — ABNORMAL LOW (ref 3.5–5.0)
Alkaline Phosphatase: 68 U/L (ref 38–126)
Anion gap: 14 (ref 5–15)
BUN: 22 mg/dL (ref 8–23)
CO2: 25 mmol/L (ref 22–32)
Calcium: 8.6 mg/dL — ABNORMAL LOW (ref 8.9–10.3)
Chloride: 107 mmol/L (ref 98–111)
Creatinine, Ser: 0.77 mg/dL (ref 0.44–1.00)
GFR, Estimated: >60 mL/min (ref >60)
Glucose, Bld: 139 mg/dL — ABNORMAL HIGH (ref 70–99)
Potassium: 3.1 mmol/L — ABNORMAL LOW (ref 3.5–5.1)
Sodium: 146 mmol/L — ABNORMAL HIGH (ref 135–145)
Total Bilirubin: 0.3 mg/dL (ref 0.0–1.2)
Total Protein: 5.9 g/dL — ABNORMAL LOW (ref 6.5–8.1)

## 2024-07-10 LAB — PROTEIN AND GLUCOSE, CSF
Glucose, CSF: 84 mg/dL — ABNORMAL HIGH (ref 40–70)
Total  Protein, CSF: 72 mg/dL — ABNORMAL HIGH (ref 15–45)

## 2024-07-10 LAB — VARICELLA ZOSTER ANTIBODY, IGG: Varicella IgG: REACTIVE

## 2024-07-10 LAB — VITAMIN B12: Vitamin B-12: 983 pg/mL — ABNORMAL HIGH (ref 180–914)

## 2024-07-10 MED ORDER — LIDOCAINE 1 % OPTIME INJ - NO CHARGE
5.0000 mL | Freq: Once | INTRAMUSCULAR | Status: AC
  Administered 2024-07-10: 5 mL via INTRADERMAL

## 2024-07-10 MED ORDER — DEXTROSE 5 % IV SOLN
10.0000 mg/kg | Freq: Three times a day (TID) | INTRAVENOUS | Status: DC
  Filled 2024-07-10 (×3): qty 18

## 2024-07-10 MED ORDER — REVEFENACIN 175 MCG/3ML IN SOLN
175.0000 ug | Freq: Every day | RESPIRATORY_TRACT | Status: DC
  Administered 2024-07-10 – 2024-07-13 (×4): 175 ug via RESPIRATORY_TRACT
  Filled 2024-07-10 (×4): qty 3

## 2024-07-10 MED ORDER — BUDESONIDE 0.25 MG/2ML IN SUSP
0.2500 mg | Freq: Two times a day (BID) | RESPIRATORY_TRACT | Status: DC
  Administered 2024-07-10 – 2024-07-13 (×7): 0.25 mg via RESPIRATORY_TRACT
  Filled 2024-07-10 (×7): qty 2

## 2024-07-10 MED ORDER — LORAZEPAM 2 MG/ML IJ SOLN
2.0000 mg | Freq: Once | INTRAMUSCULAR | Status: AC | PRN
  Administered 2024-07-10: 2 mg via INTRAVENOUS
  Filled 2024-07-10: qty 1

## 2024-07-10 MED ORDER — DEXTROSE 5 % IV SOLN
10.0000 mg/kg | Freq: Three times a day (TID) | INTRAVENOUS | Status: DC
  Administered 2024-07-10 – 2024-07-11 (×3): 660 mg via INTRAVENOUS
  Filled 2024-07-10 (×4): qty 13.2

## 2024-07-10 MED ORDER — POTASSIUM CHLORIDE 20 MEQ PO PACK
40.0000 meq | PACK | Freq: Once | ORAL | Status: AC
  Administered 2024-07-10: 40 meq via ORAL
  Filled 2024-07-10: qty 2

## 2024-07-10 MED ORDER — ARFORMOTEROL TARTRATE 15 MCG/2ML IN NEBU
15.0000 ug | INHALATION_SOLUTION | Freq: Two times a day (BID) | RESPIRATORY_TRACT | Status: DC
  Administered 2024-07-10 – 2024-07-13 (×7): 15 ug via RESPIRATORY_TRACT
  Filled 2024-07-10 (×7): qty 2

## 2024-07-10 MED ORDER — LORAZEPAM 2 MG/ML IJ SOLN
1.0000 mg | Freq: Once | INTRAMUSCULAR | Status: DC | PRN
  Filled 2024-07-10: qty 1

## 2024-07-10 NOTE — Progress Notes (Signed)
 PROGRESS NOTE        PATIENT DETAILS Name: Yvonne Todd Age: 73 y.o. Sex: female Date of Birth: 1951/07/28 Admit Date: 07/08/2024 Admitting Physician Will Bernard, MD ERE:[id number], Almarie LABOR, MD  Brief Summary: Patient is a 73 y.o.  female with history of COPD on 3 L of oxygen , DM-2, asthma-who was found by her sister with altered at home-she was subsequently brought to the ED and admitted to the hospitalist service.  See below for further details  Significant events: 11/7>> admit to [facility]  Significant studies: 11/7>> CT head: No acute intracranial abnormality 11/7>> CT abdomen/pelvis: No acute findings.  Significant microbiology data: 11/7>> blood culture: No growth 11/8>> CSF culture: Negative 11/8>> CSF meningitis panel: Negative  Procedures: 11/8>> LP under fluoroscopy (aborted due to agitation-only 0.5 cc of CSF obtained)  Consults: None  Subjective: Sleeping when I walked in-easily awoke-answers most of my questions appropriately-follows almost all my commands.  Denies any headache.  Acknowledges that she was just not feeling well for the past several days-poor historian-does not recollect exactly what happened to her at home.  Asking for water-and wants to eat.  Objective: Vitals: Blood pressure 136/64, pulse 83, temperature 97.9 F (36.6 C), temperature source Oral, resp. rate 16, height 5' 2 (1.575 m), weight 89.8 kg, SpO2 96%.   Exam: Gen Exam:Alert awake-not in any distress HEENT:atraumatic, normocephalic Chest: B/L clear to auscultation anteriorly CVS:S1S2 regular Abdomen:soft non tender, non distended Extremities:no edema Neurology: Non focal Skin: no rash  Pertinent Labs/Radiology:    Latest Ref Rng & Units 07/10/2024    7:11 AM 07/09/2024    4:14 AM 07/08/2024    4:08 PM  CBC  WBC 4.0 - 10.5 K/uL 8.6  10.7    Hemoglobin 12.0 - 15.0 g/dL 87.6  86.4  86.0   Hematocrit 36.0 - 46.0 % 36.9  39.7  41.0   Platelets 150 -  400 K/uL 314  353      Lab Results  Component Value Date   NA 146 (H) 07/10/2024   K 3.1 (L) 07/10/2024   CL 107 07/10/2024   CO2 25 07/10/2024      Assessment/Plan: Acute metabolic encephalopathy Unclear etiology-but since encephalitis/meningitis panel negative-suspect this could have been more related to AKI/DKA.   Clinically-she has improved-with empiric antimicrobial therapy-and treatment of underlying DKA/AKI Check MRI brain/EEG Will discuss with infectious disease regarding need for repeat LP/narrowing antimicrobial agents.  DKA Was very mild-resolved with IV fluids and insulin -did not have to start insulin  infusion  AKI Probably related to osmotic diuresis secondary to DKA/probable some hemodynamic mediated injury as well Improving with supportive care  Hypokalemia Replete/recheck  Hypernatremia Mild Currently n.p.o.-Will encourage oral intake once she passes a swallow screen. Repeat electrolytes tomorrow.  DM-2 (A1c 6.7 on 11/8) CBGs better controlled Lantus  12 units twice daily+ SSI   Recent Labs    07/10/24 0020 07/10/24 0327 07/10/24 0754  GLUCAP 166* 113* 128*    COPD with chronic hypoxic respiratory failure-on 2-3L of oxygen  Stable-not in exacerbation Continue to-3 L of oxygen  Continue bronchodilators  HTN BP stable Continue to hold valsartan  for now Resume when able.  PAD Resume antiplatelets when we know she does not need any further LP.  Scattered pulmonary nodules-largest 6 mm left lower lobe Incidental finding on CT imaging Radiology recommending noncontrast CT in 3 to 6 months.  Class 2 Obesity: Estimated body mass index is 36.21 kg/m as calculated from the following:   Height as of this encounter: 5' 2 (1.575 m).   Weight as of this encounter: 89.8 kg.   Code status:   Code Status: Full Code   DVT Prophylaxis: SCD's for now-resume pharmacological prophylaxis once we know she does not need repeat LP.   Family  Communication: None at bedside   Disposition Plan: Status is: Inpatient Remains inpatient appropriate because: Severity of illness   Planned Discharge Destination:Home health   Diet: Diet Order             Diet NPO time specified  Diet effective now                     Antimicrobial agents: Anti-infectives (From admission, onward)    Start     Dose/Rate Route Frequency Ordered Stop   07/09/24 1800  cefTRIAXone  (ROCEPHIN ) 2 g in sodium chloride  0.9 % 100 mL IVPB  Status:  Discontinued        2 g 200 mL/hr over 30 Minutes Intravenous Every 24 hours 07/08/24 2249 07/08/24 2255   07/09/24 1530  ampicillin (OMNIPEN) 2 g in sodium chloride  0.9 % 100 mL IVPB        2 g 300 mL/hr over 20 Minutes Intravenous Every 6 hours 07/09/24 1516     07/09/24 1200  vancomycin (VANCOREADY) IVPB 750 mg/150 mL        750 mg 150 mL/hr over 60 Minutes Intravenous Every 12 hours 07/08/24 2259     07/09/24 1000  acyclovir (ZOVIRAX) 900 mg in dextrose  5 % 250 mL IVPB        10 mg/kg  89.8 kg 268 mL/hr over 60 Minutes Intravenous Every 12 hours 07/09/24 0905     07/09/24 0400  cefTRIAXone  (ROCEPHIN ) 2 g in sodium chloride  0.9 % 100 mL IVPB        2 g 200 mL/hr over 30 Minutes Intravenous Every 12 hours 07/08/24 2255     07/08/24 2300  vancomycin (VANCOREADY) IVPB 1750 mg/350 mL        1,750 mg 175 mL/hr over 120 Minutes Intravenous  Once 07/08/24 2256 07/09/24 0427   07/08/24 1600  cefTRIAXone  (ROCEPHIN ) 1 g in sodium chloride  0.9 % 100 mL IVPB        1 g 200 mL/hr over 30 Minutes Intravenous  Once 07/08/24 1547 07/08/24 1945        MEDICATIONS: Scheduled Meds:  insulin  aspart  0-15 Units Subcutaneous Q4H   insulin  glargine-yfgn  15 Units Subcutaneous BID   melatonin  5 mg Oral QHS   potassium chloride   40 mEq Oral BID   Continuous Infusions:  sodium chloride  100 mL/hr at 07/10/24 0332   acyclovir 900 mg (07/09/24 2122)   ampicillin (OMNIPEN) IV 2 g (07/10/24 0524)   cefTRIAXone   (ROCEPHIN )  IV 2 g (07/10/24 0818)   vancomycin 750 mg (07/09/24 2344)   PRN Meds:.acetaminophen  **OR** acetaminophen , bisacodyl , haloperidol lactate, ondansetron  **OR** ondansetron  (ZOFRAN ) IV   I have personally reviewed following labs and imaging studies  LABORATORY DATA: CBC: Recent Labs  Lab 07/08/24 1533 07/08/24 1607 07/08/24 1608 07/09/24 0414 07/10/24 0711  WBC 10.4  --   --  10.7* 8.6  NEUTROABS 8.3*  --   --   --   --   HGB 14.7 12.9 13.9 13.5 12.3  HCT 43.4 38.0 41.0 39.7 36.9  MCV 90.8  --   --  91.1 92.0  PLT 469*  --   --  353 314    Basic Metabolic Panel: Recent Labs  Lab 07/08/24 1533 07/08/24 1607 07/08/24 1608 07/09/24 0414 07/10/24 0711  NA 138 141 142 140 146*  K 3.7 3.3* 3.4* 3.3* 3.1*  CL 101  --  108 104 107  CO2 18*  --   --  21* 25  GLUCOSE 415*  --  398* 257* 139*  BUN 102*  --  95* 69* 22  CREATININE 1.55*  --  1.30* 1.13* 0.77  CALCIUM  8.5*  --   --  9.1 8.6*  MG  --   --   --  2.7*  --     GFR: Estimated Creatinine Clearance: 64.3 mL/min (by C-G formula based on SCr of 0.77 mg/dL).  Liver Function Tests: Recent Labs  Lab 07/08/24 1533 07/09/24 0414 07/10/24 0711  AST 19 17 20   ALT 21 22 25   ALKPHOS 85 78 68  BILITOT 1.0 0.8 0.3  PROT 7.1 6.5 5.9*  ALBUMIN  3.6 3.3* 2.7*   No results for input(s): LIPASE, AMYLASE in the last 168 hours. Recent Labs  Lab 07/10/24 0711  AMMONIA 20    Coagulation Profile: No results for input(s): INR, PROTIME in the last 168 hours.  Cardiac Enzymes: Recent Labs  Lab 07/08/24 1533  CKTOTAL 78    BNP (last 3 results) No results for input(s): PROBNP in the last 8760 hours.  Lipid Profile: No results for input(s): CHOL, HDL, LDLCALC, TRIG, CHOLHDL, LDLDIRECT in the last 72 hours.  Thyroid  Function Tests: No results for input(s): TSH, T4TOTAL, FREET4, T3FREE, THYROIDAB in the last 72 hours.  Anemia Panel: Recent Labs    07/10/24 0711   VITAMINB12 983*    Urine analysis:    Component Value Date/Time   COLORURINE YELLOW 07/08/2024 1502   APPEARANCEUR HAZY (A) 07/08/2024 1502   LABSPEC 1.022 07/08/2024 1502   PHURINE 5.0 07/08/2024 1502   GLUCOSEU >=500 (A) 07/08/2024 1502   HGBUR SMALL (A) 07/08/2024 1502   BILIRUBINUR NEGATIVE 07/08/2024 1502   KETONESUR 5 (A) 07/08/2024 1502   PROTEINUR 30 (A) 07/08/2024 1502   UROBILINOGEN 0.2 06/23/2014 1222   NITRITE NEGATIVE 07/08/2024 1502   LEUKOCYTESUR NEGATIVE 07/08/2024 1502    Sepsis Labs: Lactic Acid, Venous    Component Value Date/Time   LATICACIDVEN 1.1 07/09/2024 0428    MICROBIOLOGY: Recent Results (from the past 240 hours)  Blood culture (routine x 2)     Status: None (Preliminary result)   Collection Time: 07/08/24  3:48 PM   Specimen: BLOOD RIGHT FOREARM  Result Value Ref Range Status   Specimen Description BLOOD RIGHT FOREARM  Final   Special Requests   Final    BOTTLES DRAWN AEROBIC AND ANAEROBIC Blood Culture results may not be optimal due to an inadequate volume of blood received in culture bottles   Culture   Final    NO GROWTH 2 DAYS Performed at [facility], [address] [place], [place] [address]    Report Status PENDING  Incomplete  Blood culture (routine x 2)     Status: None (Preliminary result)   Collection Time: 07/08/24  5:50 PM   Specimen: BLOOD RIGHT HAND  Result Value Ref Range Status   Specimen Description BLOOD RIGHT HAND  Final   Special Requests   Final    BOTTLES DRAWN AEROBIC AND ANAEROBIC Blood Culture results may not be optimal due to an inadequate volume of blood  received in culture bottles   Culture   Final    NO GROWTH 2 DAYS Performed at [facility], [address] [place], [place] [address]    Report Status PENDING  Incomplete  CSF culture w Gram Stain     Status: None (Preliminary result)   Collection Time: 07/09/24  1:42 PM   Specimen: PATH Cytology CSF; Cerebrospinal Fluid  Result Value  Ref Range Status   Specimen Description CSF  Final   Special Requests NONE  Final   Gram Stain   Final    FEW WBC PRESENT, PREDOMINANTLY MONONUCLEAR NO ORGANISMS SEEN Performed at [facility], [address], [place], [place] [address]    Culture PENDING  Incomplete   Report Status PENDING  Incomplete    RADIOLOGY STUDIES/RESULTS: DG FL GUIDED LUMBAR PUNCTURE Result Date: 07/09/2024 CLINICAL DATA:  73 year old female admitted with altered mental status. IR was requested for lumbar puncture. EXAM: LUMBAR PUNCTURE UNDER FLUOROSCOPY PROCEDURE: Patient received 1 mg Ativan IV prior to procedure, as ordered by patient's admitting physician. An appropriate skin entry site was determined fluoroscopically. Operator donned sterile gloves and mask. Skin site was marked, then prepped with Betadine, draped in usual sterile fashion, and infiltrated locally with 1% lidocaine . A 20 gauge spinal needle advanced into the thecal sac at L4-L5 from a left interlaminar approach. Minimally blood-tinged CSF spontaneously returned, with opening pressure of 12 cm water. 0.5 mL ml CSF were collected. However, the procedure was soon aborted, as patient remained agitated despite Ativan given, which precluded safe completion of lumbar puncture. The patient tolerated the procedure poorly, though there were no discerned complications. FLUOROSCOPY: Radiation Exposure Index (as provided by the fluoroscopic device): 17.00 mGy Kerma IMPRESSION: Technically successful lumbar puncture under fluoroscopy. This exam was performed by Carlin Griffon, PA-C, and was supervised and interpreted by Dr. Wilkie Lent, MD. Electronically Signed   By: Wilkie Lent M.D.   On: 07/09/2024 15:00   CT ABDOMEN PELVIS WO CONTRAST Result Date: 07/08/2024 EXAM: CT ABDOMEN AND PELVIS WITHOUT CONTRAST 07/08/2024 08:12:58 PM TECHNIQUE: CT of the abdomen and pelvis was performed without the administration of intravenous contrast. Multiplanar  reformatted images are provided for review. Automated exposure control, iterative reconstruction, and/or weight-based adjustment of the mA/kV was utilized to reduce the radiation dose to as low as reasonably achievable. COMPARISON: None available. CLINICAL HISTORY: Abdominal pain. FINDINGS: LOWER CHEST: Prominent bases are well aerated with emphysematous changes. Scattered small pulmonary nodules are noted particularly in the left lower lobe. The largest of these measures 6 mm best seen on image number 6 of series 5. LIVER: The liver is within normal limits. GALLBLADDER AND BILE DUCTS: Cholelithiasis is noted without complicating factors. No biliary ductal dilatation. SPLEEN: The spleen is unremarkable. PANCREAS: The pancreas is unremarkable. ADRENAL GLANDS: The adrenal glands are within normal limits. KIDNEYS, URETERS AND BLADDER: The kidneys are well visualized bilaterally. No renal calculi or obstructive changes are seen. The bladder is well distended. GI AND BOWEL: Stomach demonstrates no acute abnormality. Scattered diverticular changes of the colon are noted without diverticulitis. The appendix is within normal limits. The small bowel is unremarkable. PERITONEUM AND RETROPERITONEUM: No free fluid is noted. No free air. VASCULATURE: Aortic calcifications are seen without aneurysmal dilatation. LYMPH NODES: No lymphadenopathy. REPRODUCTIVE ORGANS: Uterus is within normal limits. No adnexal mass is seen. BONES AND SOFT TISSUES: Degenerative changes of the lumbar spine are seen. IMPRESSION: 1. No acute findings in the abdomen or pelvis. 2. Scattered  small pulmonary nodules, largest measuring 6 mm in the left lower lobe. Recommend non-contrast chest CT at 36 months, then consider non-contrast chest CT at 1824 months for multiple nodules with most suspicious 6 mm per Fleischner Society Guidelines. 3. Pulmonary emphysema. Recommend consideration for evaluation for a low-dose CT lung cancer screening program for  eligible patients aged 35. 4. Cholelithiasis without complicating factors. 5. Scattered diverticular changes of the colon without diverticulitis. Electronically signed by: Oneil Devonshire MD 07/08/2024 08:27 PM EST RP Workstation: [id number]   CT Head Wo Contrast Result Date: 07/08/2024 EXAM: CT HEAD WITHOUT CONTRAST 07/08/2024 05:35:50 PM TECHNIQUE: CT of the head was performed without the administration of intravenous contrast. Automated exposure control, iterative reconstruction, and/or weight based adjustment of the mA/kV was utilized to reduce the radiation dose to as low as reasonably achievable. COMPARISON: None available. CLINICAL HISTORY: Mental status change, unknown cause. FINDINGS: BRAIN AND VENTRICLES: No acute hemorrhage. No evidence of acute infarct. No hydrocephalus. No extra-axial collection. No mass effect or midline shift. Mild chronic microangiopathic changes. ORBITS: No acute abnormality. SINUSES: No acute abnormality. SOFT TISSUES AND SKULL: No acute soft tissue abnormality. No skull fracture. Small right mastoid air cell effusion. IMPRESSION: 1. No acute intracranial abnormality. Electronically signed by: Norman Gatlin MD 07/08/2024 06:08 PM EST RP Workstation: [id number]   DG Chest Portable 1 View Result Date: 07/08/2024 EXAM: 1 VIEW(S) XRAY OF THE CHEST 07/08/2024 03:20:00 PM COMPARISON: Chest radiograph 01/13/2024. CLINICAL HISTORY: SOB, sepsis. FINDINGS: LUNGS AND PLEURA: No focal pulmonary opacity. No pulmonary edema. No pleural effusion. No pneumothorax. HEART AND MEDIASTINUM: Aortic atherosclerosis. Stable cardiomediastinal silhouette. BONES AND SOFT TISSUES: Degenerative changes of the bilateral glenohumeral joints. No acute osseous abnormality. IMPRESSION: 1. No acute cardiopulmonary findings. 2. Aortic atherosclerosis. Electronically signed by: Harrietta Sherry MD 07/08/2024 04:25 PM EST RP Workstation: [id number]     LOS: 2 days   Donalda Applebaum, MD  [facility]    To contact the attending provider between 7A-7P or the covering provider during after hours 7P-7A, please log into the web site [email or web address] and access using universal [place] password for that web site. If you do not have the password, please call the hospital operator.  07/10/2024, 9:11 AM

## 2024-07-10 NOTE — Progress Notes (Signed)
 PT Cancellation Note  Patient Details Name: Yvonne Todd MRN: [id number] DOB: 12/21/1950   Cancelled Treatment:    Reason Eval/Treat Not Completed: Fatigue/lethargy limiting ability to participate this afternoon, per RN pt has been lethargic since medications for LP. Pt unable to maintain alertness to participate in evaluation. Will continue to follow and evaluate as able tomorrow.   Izetta Call, PT, DPT   Acute Rehabilitation Department Office [phone] Secure Chat Communication Preferred   [name] 07/10/2024, 4:00 PM

## 2024-07-10 NOTE — Progress Notes (Signed)
 Pt continues to be confused and disoriented this shift, but oriented to self only. However, pt able to explain physical needs.   Pt removed lines/tubes, removed PIV. Pt also had removed dressing to LP site.

## 2024-07-10 NOTE — Progress Notes (Signed)
 EEG complete - results pending. Hair severely matted.

## 2024-07-10 NOTE — Procedures (Signed)
 PROCEDURE SUMMARY:  Successful fluoroscopic guided lumbar puncture at the level of L3-4.  Opening pressure was 12 cm H2O ~6 mL blood-tinged fluid collected and sent for labs.  No immediate complications.  Pt tolerated well.   EBL = none  Please see full dictation in imaging section of Epic for procedure details.   Electronically Signed: Kearra Calkin M Kairen Hallinan, PA-C 07/10/2024, 1:38 PM

## 2024-07-10 NOTE — Plan of Care (Signed)
 ?  Problem: Education: ?Goal: Knowledge of General Education information will improve ?Description: Including pain rating scale, medication(s)/side effects and non-pharmacologic comfort measures ?Outcome: Progressing ?  ?Problem: Health Behavior/Discharge Planning: ?Goal: Ability to manage health-related needs will improve ?Outcome: Progressing ?  ?Problem: Coping: ?Goal: Level of anxiety will decrease ?Outcome: Progressing ?  ?

## 2024-07-11 ENCOUNTER — Ambulatory Visit: Admitting: Podiatry

## 2024-07-11 ENCOUNTER — Inpatient Hospital Stay (HOSPITAL_COMMUNITY)

## 2024-07-11 DIAGNOSIS — J9611 Chronic respiratory failure with hypoxia: Secondary | ICD-10-CM | POA: Diagnosis not present

## 2024-07-11 DIAGNOSIS — R4182 Altered mental status, unspecified: Secondary | ICD-10-CM

## 2024-07-11 DIAGNOSIS — R569 Unspecified convulsions: Secondary | ICD-10-CM

## 2024-07-11 DIAGNOSIS — G9340 Encephalopathy, unspecified: Secondary | ICD-10-CM | POA: Diagnosis not present

## 2024-07-11 DIAGNOSIS — E118 Type 2 diabetes mellitus with unspecified complications: Secondary | ICD-10-CM | POA: Diagnosis not present

## 2024-07-11 LAB — COMPREHENSIVE METABOLIC PANEL WITH GFR
ALT: 20 U/L (ref 0–44)
AST: 17 U/L (ref 15–41)
Albumin: 2.3 g/dL — ABNORMAL LOW (ref 3.5–5.0)
Alkaline Phosphatase: 60 U/L (ref 38–126)
Anion gap: 10 (ref 5–15)
BUN: 12 mg/dL (ref 8–23)
CO2: 27 mmol/L (ref 22–32)
Calcium: 8.2 mg/dL — ABNORMAL LOW (ref 8.9–10.3)
Chloride: 104 mmol/L (ref 98–111)
Creatinine, Ser: 0.58 mg/dL (ref 0.44–1.00)
GFR, Estimated: >60 mL/min (ref >60)
Glucose, Bld: 183 mg/dL — ABNORMAL HIGH (ref 70–99)
Potassium: 3 mmol/L — ABNORMAL LOW (ref 3.5–5.1)
Sodium: 141 mmol/L (ref 135–145)
Total Bilirubin: 0.3 mg/dL (ref 0.0–1.2)
Total Protein: 5.4 g/dL — ABNORMAL LOW (ref 6.5–8.1)

## 2024-07-11 LAB — CBC
HCT: 32.1 % — ABNORMAL LOW (ref 36.0–46.0)
Hemoglobin: 10.7 g/dL — ABNORMAL LOW (ref 12.0–15.0)
MCH: 30.7 pg (ref 26.0–34.0)
MCHC: 33.3 g/dL (ref 30.0–36.0)
MCV: 92 fL (ref 80.0–100.0)
Platelets: 290 K/uL (ref 150–400)
RBC: 3.49 MIL/uL — ABNORMAL LOW (ref 3.87–5.11)
RDW: 12.4 % (ref 11.5–15.5)
WBC: 8.1 K/uL (ref 4.0–10.5)
nRBC: 0 % (ref 0.0–0.2)

## 2024-07-11 LAB — PATHOLOGIST SMEAR REVIEW

## 2024-07-11 LAB — GLUCOSE, CAPILLARY
Glucose-Capillary: 101 mg/dL — ABNORMAL HIGH (ref 70–99)
Glucose-Capillary: 114 mg/dL — ABNORMAL HIGH (ref 70–99)
Glucose-Capillary: 158 mg/dL — ABNORMAL HIGH (ref 70–99)
Glucose-Capillary: 160 mg/dL — ABNORMAL HIGH (ref 70–99)
Glucose-Capillary: 164 mg/dL — ABNORMAL HIGH (ref 70–99)
Glucose-Capillary: 215 mg/dL — ABNORMAL HIGH (ref 70–99)

## 2024-07-11 MED ORDER — POTASSIUM CHLORIDE 20 MEQ PO PACK
40.0000 meq | PACK | Freq: Once | ORAL | Status: AC
  Administered 2024-07-11: 40 meq via ORAL
  Filled 2024-07-11: qty 2

## 2024-07-11 MED ORDER — INSULIN GLARGINE-YFGN 100 UNIT/ML ~~LOC~~ SOLN
25.0000 [IU] | Freq: Every day | SUBCUTANEOUS | Status: DC
  Administered 2024-07-11 – 2024-07-13 (×3): 25 [IU] via SUBCUTANEOUS
  Filled 2024-07-11 (×3): qty 0.25

## 2024-07-11 MED ORDER — GADOBUTROL 1 MMOL/ML IV SOLN
9.0000 mL | Freq: Once | INTRAVENOUS | Status: AC | PRN
  Administered 2024-07-11: 9 mL via INTRAVENOUS

## 2024-07-11 MED ORDER — MONTELUKAST SODIUM 10 MG PO TABS
10.0000 mg | ORAL_TABLET | Freq: Every day | ORAL | Status: DC
  Administered 2024-07-11 – 2024-07-12 (×2): 10 mg via ORAL
  Filled 2024-07-11 (×2): qty 1

## 2024-07-11 MED ORDER — FLUTICASONE PROPIONATE 50 MCG/ACT NA SUSP
2.0000 | Freq: Every day | NASAL | Status: DC
  Administered 2024-07-11 – 2024-07-13 (×3): 2 via NASAL
  Filled 2024-07-11: qty 16

## 2024-07-11 MED ORDER — FAMOTIDINE 20 MG PO TABS
40.0000 mg | ORAL_TABLET | Freq: Every day | ORAL | Status: DC
  Administered 2024-07-11 – 2024-07-13 (×3): 40 mg via ORAL
  Filled 2024-07-11 (×3): qty 2

## 2024-07-11 MED ORDER — ATORVASTATIN CALCIUM 80 MG PO TABS
80.0000 mg | ORAL_TABLET | Freq: Every day | ORAL | Status: DC
  Administered 2024-07-11 – 2024-07-12 (×2): 80 mg via ORAL
  Filled 2024-07-11 (×2): qty 1

## 2024-07-11 NOTE — Progress Notes (Signed)
 OT Cancellation Note  Patient Details Name: Yvonne Todd MRN: [id number] DOB: 30-Apr-1951   Cancelled Treatment:    Reason Eval/Treat Not Completed: Patient at procedure or test/ unavailable. Initiated eval with pt oriented to self; re-oriented for location, situation, and date. Began history taking, however, transport entering room for MRI. Will proceed with eval as schedule allows.   Elma JONETTA Lebron FREDERICK, OTR/L [facility] Acute Rehabilitation Office: [phone]   Elma JONETTA Lebron 07/11/2024, 8:14 AM

## 2024-07-11 NOTE — Procedures (Signed)
 Patient Name: Yvonne Todd  MRN: [id number]  Epilepsy Attending: Arlin MALVA Krebs  Referring Physician/Provider: Raenelle Donalda HERO, MD  Date: 07/11/2024 Duration: 21.22 mins  Patient history: 73 year old female with altered mental status.  EEG to alert for seizure.  Level of alertness: Awake/ lethargic  AEDs during EEG study: None  Technical aspects: This EEG study was done with scalp electrodes positioned according to the 10-20 International system of electrode placement. Electrical activity was reviewed with band pass filter of 1-70Hz , sensitivity of 7 uV/mm, display speed of 54mm/sec with a 60Hz  notched filter applied as appropriate. EEG data were recorded continuously and digitally stored.  Video monitoring was available and reviewed as appropriate.  Description: EEG showed continuous generalized predominantly 5 to 8 Hz theta-alpha activity admixed with intermittent 2 to 3 Hz delta slowing. Hyperventilation and photic stimulation were not performed.    Of note, study was technically difficult due to significant movement and electrode artifact.   ABNORMALITY - Continuous slow, generalized   IMPRESSION: This technically difficult study is suggestive of generalized cerebral dysfunction (encephalopathy). No seizures or epileptiform discharges were seen throughout the recording.   Haywood Meinders O Salaam Battershell

## 2024-07-11 NOTE — Evaluation (Signed)
 Occupational Therapy Evaluation Patient Details Name: Yvonne Todd MRN: [id number] DOB: 05-06-1951 Today's Date: 07/11/2024   History of Present Illness   The pt is a 73 yo female presenting 11/7 with decreased responsiveness, covered in urine/feces, and c/o substernal chest pain. Admitted with metabolic encephalopathy, DKA, and AKI. CT abdomen showed scattered pulmonary nodules. PMH includes: arthritis, HTN, PAD, COPD on 3L O2, DM II, HLD, and obesity (BMI 36).     Clinical Impressions PTA, pt lived alone and reports being independent; however, given lack of orientation, and inconsistent report when pt asked whether she had assist with IADL tasks. Today, pt oriented to self, intermittently lethargic, but able to maintain arousal when engaged in mobility or BADL. Pt needing cues for sequencing and safety during BADL and mobility this session secondary to being easily externally distracted and intermittent lethargy. Pt presents with decreased balance, strength, orientation, cognition, safety, awareness, problem solving. Pt needing mod A +1-2 for transfers this session and min A for short distance functional mobility with RW. Pt additionally needing set-up to max A for BADL at this time. Patient will benefit from continued inpatient follow up therapy, <3 hours/day      If plan is discharge home, recommend the following:   A little help with walking and/or transfers;A lot of help with walking and/or transfers;A lot of help with bathing/dressing/bathroom;Assistance with cooking/housework;Assist for transportation;Help with stairs or ramp for entrance;Direct supervision/assist for medications management;Direct supervision/assist for financial management     Functional Status Assessment   Patient has had a recent decline in their functional status and demonstrates the ability to make significant improvements in function in a reasonable and predictable amount of time.     Equipment  Recommendations   BSC/3in1;Other (comment) (RW)     Recommendations for Other Services   Speech consult (cog)     Precautions/Restrictions   Precautions Precautions: Fall Recall of Precautions/Restrictions: Impaired Precaution/Restrictions Comments: watch HR, on 2-3L O2 at baseline Restrictions Weight Bearing Restrictions Per Provider Order: No     Mobility Bed Mobility Overal bed mobility: Needs Assistance Bed Mobility: Supine to Sit     Supine to sit: Min assist, Used rails, HOB elevated     General bed mobility comments: Pt able to bring her legs off L EOB on her own but needed minA to ascend her trunk to sit up.    Transfers Overall transfer level: Needs assistance Equipment used: Rolling walker (2 wheels) Transfers: Sit to/from Stand Sit to Stand: Mod assist, +2 physical assistance, +2 safety/equipment           General transfer comment: Cues needed to place hands on EOB or on grab bars when in bathroom to push/pull up to stand rather than pull up on RW. ModAx2 to power up to stand the first rep from EOB and modAx1 with +2 for safety the second rep from the commode. Pt needed cues to facilitate anterior weight shift      Balance Overall balance assessment: Needs assistance Sitting-balance support: No upper extremity supported, Feet supported Sitting balance-Leahy Scale: Fair Sitting balance - Comments: supervision sitting statically EOB, able to perform anterior pericare when sitting on commode with CGA for safety   Standing balance support: Bilateral upper extremity supported, During functional activity, Reliant on assistive device for balance Standing balance-Leahy Scale: Poor Standing balance comment: reliant on RW and external physical assistance, posterior lean noted initially  ADL either performed or assessed with clinical judgement   ADL Overall ADL's : Needs assistance/impaired Eating/Feeding: Set  up;Sitting   Grooming: Wash/dry hands;Minimal assistance;Standing   Upper Body Bathing: Set up;Sitting   Lower Body Bathing: Moderate assistance;Sit to/from stand;Sitting/lateral leans   Upper Body Dressing : Set up;Sitting   Lower Body Dressing: Maximal assistance;Sit to/from stand;Sitting/lateral leans   Toilet Transfer: Minimal assistance;Comfort height toilet;Ambulation;Rolling walker (2 wheels);Grab bars   Toileting- Clothing Manipulation and Hygiene: Contact guard assist;Sitting/lateral lean;Minimal assistance       Functional mobility during ADLs: Minimal assistance;Rolling walker (2 wheels)       Vision Patient Visual Report: No change from baseline Vision Assessment?: Vision impaired- to be further tested in functional context Additional Comments: pt able to track L and R. difficulty following multistep commands for more in depth testing. did bump into obstacle in room on L 1x. pt denies changes     Perception         Praxis         Pertinent Vitals/Pain Pain Assessment Pain Assessment: Faces Faces Pain Scale: No hurt Pain Intervention(s): Limited activity within patient's tolerance, Monitored during session     Extremity/Trunk Assessment Upper Extremity Assessment Upper Extremity Assessment: Generalized weakness   Lower Extremity Assessment Lower Extremity Assessment: Defer to PT evaluation   Cervical / Trunk Assessment Cervical / Trunk Assessment: Normal   Communication Communication Communication: No apparent difficulties   Cognition Arousal: Alert, Lethargic Behavior During Therapy: Flat affect Cognition: No family/caregiver present to determine baseline, Cognition impaired   Orientation impairments: Place, Time, Situation (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 is at [facility])) Awareness: Intellectual awareness impaired, Online awareness impaired Memory impairment (select all impairments): Short-term memory, Declarative long-term  memory Attention impairment (select first level of impairment): Sustained attention, Focused attention Executive functioning impairment (select all impairments): Organization, Sequencing, Problem solving, Initiation                   Following commands: Impaired Following commands impaired: Follows one step commands inconsistently, Follows one step commands with increased time     Cueing  General Comments   Cueing Techniques: Verbal cues;Tactile cues  HR 100-110s when ambulating, but would spontaneously jump up to 150s with max of 176 bpm when sitting, MD made aware   Exercises     Shoulder Instructions      Home Living Family/patient expects to be discharged to:: Private residence Living Arrangements: Alone Available Help at Discharge: Family Type of Home: Mobile home Home Access:  (pt unable to provide answer on STE)     Home Layout: One level     Bathroom Shower/Tub: Chief Strategy Officer: Standard     Home Equipment: None   Additional Comments: On 2-3L O2 at baseline per pt; unsure of accuracy of home info as pt does not appear to be a reliable historian at this time      Prior Functioning/Environment Prior Level of Function : Independent/Modified Independent;Patient poor historian/Family not available             Mobility Comments: No AD per pt ADLs Comments: per pt, independent, drives    OT Problem List: Decreased strength;Impaired balance (sitting and/or standing);Decreased activity tolerance;Decreased cognition;Decreased safety awareness;Decreased knowledge of use of DME or AE   OT Treatment/Interventions: Self-care/ADL training;Therapeutic exercise;DME and/or AE instruction;Therapeutic activities;Patient/family education;Balance training      OT Goals(Current goals can be found in the care plan section)   Acute Rehab  OT Goals Patient Stated Goal: get better OT Goal Formulation: With patient Time For Goal Achievement:  07/25/24 Potential to Achieve Goals: Good   OT Frequency:  Min 2X/week    Co-evaluation PT/OT/SLP Co-Evaluation/Treatment: Yes Reason for Co-Treatment: Necessary to address cognition/behavior during functional activity;For patient/therapist safety;To address functional/ADL transfers PT goals addressed during session: Mobility/safety with mobility;Balance;Proper use of DME OT goals addressed during session: ADL's and self-care      AM-PAC OT 6 Clicks Daily Activity     Outcome Measure Help from another person eating meals?: A Little Help from another person taking care of personal grooming?: A Little Help from another person toileting, which includes using toliet, bedpan, or urinal?: A Lot Help from another person bathing (including washing, rinsing, drying)?: A Lot Help from another person to put on and taking off regular upper body clothing?: A Little Help from another person to put on and taking off regular lower body clothing?: A Lot 6 Click Score: 15   End of Session Equipment Utilized During Treatment: Gait belt;Rolling walker (2 wheels) Nurse Communication: Mobility status  Activity Tolerance: Patient tolerated treatment well Patient left: in chair;with call bell/phone within reach;with chair alarm set  OT Visit Diagnosis: Unsteadiness on feet (R26.81);Muscle weakness (generalized) (M62.81);Other symptoms and signs involving cognitive function                Time: 1050-1118 OT Time Calculation (min): 28 min Charges:  OT General Charges $OT Visit: 1 Visit OT Evaluation $OT Eval Moderate Complexity: 1 Mod  Elma JONETTA Lebron FREDERICK, OTR/L [facility] Acute Rehabilitation Office: [phone]   Elma JONETTA Lebron 07/11/2024, 11:59 AM

## 2024-07-11 NOTE — TOC Initial Note (Signed)
 Transition of Care [facility]) - Initial/Assessment Note    Patient Details  Name: Yvonne Todd MRN: [id number] Date of Birth: 01/14/1951  [facility] CM/SW Contact:    Bernardino Dean, LCSWA Phone Number: 07/11/2024, 3:04 PM  Clinical Narrative:                  11/10 3:05 - CSW received SNF consult. Spoke with patient's sister [name] via phone d/t patient being documented as a poor historian. Discussed likely need for SNF placement at discharge per PT/OT recs. [name] in agreement with SNF at time of discharge. Referrals sent. Will present bed offers and Medicare star ratings list when available.   Expected Discharge Plan: Skilled Nursing Facility Barriers to Discharge: Continued Medical Work up, English As A Second Language Teacher, SNF Pending bed offer   Patient Goals and CMS Choice Patient states their goals for this hospitalization and ongoing recovery are:: Spoke with sister, goal is to regain strength at post acute rehab CMS Medicare.gov Compare Post Acute Care list provided to:: Patient Represenative (must comment) Choice offered to / list presented to : Sibling [place] ownership interest in [facility].provided to:: Sibling    Expected Discharge Plan and Services In-house Referral: Clinical Social Work   Post Acute Care Choice: Skilled Nursing Facility Living arrangements for the past 2 months: Mobile Home                                      Prior Living Arrangements/Services Living arrangements for the past 2 months: Mobile Home Lives with:: Self Patient language and need for interpreter reviewed:: Yes Do you feel safe going back to the place where you live?: Yes      Need for Family Participation in Patient Care: Yes (Comment) Care giver support system in place?: Yes (comment) Current home services: DME (o2) Criminal Activity/Legal Involvement Pertinent to Current Situation/Hospitalization: No - Comment as needed  Activities of Daily Living   ADL  Screening (condition at time of admission) Independently performs ADLs?: Yes (appropriate for developmental age) Is the patient deaf or have difficulty hearing?: No Does the patient have difficulty seeing, even when wearing glasses/contacts?: No Does the patient have difficulty concentrating, remembering, or making decisions?: No  Permission Sought/Granted Permission sought to share information with : Facility Medical Sales Representative, Family Supports Permission granted to share information with : No  Share Information with NAME: Loring [name] JONETTA GLENWOOD Sister [phone]  Permission granted to share info w AGENCY: SNF's        Emotional Assessment Appearance:: Appears stated age Attitude/Demeanor/Rapport: Unable to Assess Affect (typically observed): Unable to Assess Orientation: : Oriented to Place, Oriented to Self Alcohol  / Substance Use: Not Applicable Psych Involvement: No (comment)  Admission diagnosis:  Encephalopathy acute [G93.40] Encephalopathy, unspecified type [G93.40] Patient Active Problem List   Diagnosis Date Noted   Encephalopathy acute 07/08/2024   Pain due to onychomycosis of toenails of both feet 09/08/2023   Neuropathy due to type 2 diabetes mellitus (HCC) 09/15/2022   B12 deficiency 03/08/2021   Foot swelling 02/18/2021   PVD (peripheral vascular disease) 02/18/2021   Abnormal findings on diagnostic imaging of lung 03/26/2018   Allergic rhinitis 03/26/2018   Chronic diastolic CHF (congestive heart failure) (HCC) 04/13/2017   Cardiomegaly 04/13/2017   Chronic respiratory failure with hypoxia (HCC) 06/18/2015   Routine general medical examination at a health care facility 02/21/2015   GOLD COPD IV  D 07/06/2014   Essential hypertension 04/27/2014   Traumatic arthritis of right knee 04/27/2014   Former tobacco use 03/20/2014   Hyperlipidemia associated with type 2 diabetes mellitus (HCC) 03/20/2014   Left carotid bruit 03/20/2014   Type 2 diabetes with  complication (HCC) 03/20/2014   PCP:  Rollene Almarie LABOR, MD Pharmacy:   [facility] - [facility] [address] [place] [address] Phone: [phone] Fax: [phone]     Social Drivers of Health (SDOH) Social History: SDOH Screenings   Food Insecurity: No Food Insecurity (07/09/2024)  Housing: Low Risk  (07/09/2024)  Transportation Needs: No Transportation Needs (07/09/2024)  Utilities: Not At Risk (07/09/2024)  Alcohol  Screen: Low Risk  (06/22/2024)  Depression (PHQ2-9): Low Risk  (06/22/2024)  Financial Resource Strain: Low Risk  (06/22/2024)  Physical Activity: Inactive (06/22/2024)  Social Connections: Moderately Isolated (07/09/2024)  Stress: No Stress Concern Present (06/22/2024)  Tobacco Use: Medium Risk (07/09/2024)  Health Literacy: Adequate Health Literacy (06/22/2024)   SDOH Interventions:     Readmission Risk Interventions     No data to display

## 2024-07-11 NOTE — Plan of Care (Signed)

## 2024-07-11 NOTE — Plan of Care (Signed)
   Problem: Skin Integrity: Goal: Risk for impaired skin integrity will decrease Outcome: Progressing

## 2024-07-11 NOTE — Evaluation (Addendum)
 Physical Therapy Evaluation Patient Details Name: Yvonne Todd MRN: [id number] DOB: 1950-10-28 Today's Date: 07/11/2024  History of Present Illness  The pt is a 73 yo female presenting 11/7 with decreased responsiveness, covered in urine/feces, and c/o substernal chest pain. Admitted with metabolic encephalopathy, DKA, and AKI. CT abdomen showed scattered pulmonary nodules. PMH includes: arthritis, HTN, PAD, COPD on 3L O2, DM II, HLD, and obesity (BMI 36).   Clinical Impression  Pt presents with condition above and deficits mentioned below, see PT Problem List. The pt is only oriented to self at this time and is lethargic, but alert when upright and mobilizing. She is an unreliable historian but reports at baseline she is independent without DME and she lives alone in a mobile home. Currently, she displays deficits in balance, strength, power, cognition, and activity tolerance. She is at risk for falls. Pt currently needs minA to transition supine to sit, modAx1-2 to transfer to stand, and minA (+2 for safety) to ambulate with a RW. She is easily distracted and needs cues to remain on task often. At this time, she could benefit from short-term inpatient rehab, < 3 hours/day, to maximize her independence and safety with functional mobility before returning home alone. Will continue to follow acutely.  Of note--HR 100-110s when ambulating, but would spontaneously jump up to 150s with max of 176 bpm when sitting, MD made aware      If plan is discharge home, recommend the following: A lot of help with walking and/or transfers;A lot of help with bathing/dressing/bathroom;Assistance with cooking/housework;Direct supervision/assist for medications management;Direct supervision/assist for financial management;Assist for transportation;Help with stairs or ramp for entrance;Supervision due to cognitive status   Can travel by private vehicle   Yes    Equipment Recommendations Rolling walker (2  wheels);BSC/3in1  Recommendations for Other Services       Functional Status Assessment Patient has had a recent decline in their functional status and demonstrates the ability to make significant improvements in function in a reasonable and predictable amount of time.     Precautions / Restrictions Precautions Precautions: Fall Recall of Precautions/Restrictions: Impaired Precaution/Restrictions Comments: watch HR, on 2-3L O2 at baseline Restrictions Weight Bearing Restrictions Per Provider Order: No      Mobility  Bed Mobility Overal bed mobility: Needs Assistance Bed Mobility: Supine to Sit     Supine to sit: Min assist, Used rails, HOB elevated     General bed mobility comments: Pt able to bring her legs off L EOB on her own but needed minA to ascend her trunk to sit up.    Transfers Overall transfer level: Needs assistance Equipment used: Rolling walker (2 wheels) Transfers: Sit to/from Stand Sit to Stand: Mod assist, +2 physical assistance, +2 safety/equipment           General transfer comment: Cues needed to place hands on EOB or on grab bars when in bathroom to push/pull up to stand rather than pull up on RW. ModAx2 to power up to stand the first rep from EOB and modAx1 with +2 for safety the second rep from the commode. Pt needed cues to facilitate anterior weight shift    Ambulation/Gait Ambulation/Gait assistance: Min assist, +2 safety/equipment Gait Distance (Feet): 20 Feet (x2 bouts of ~10 ft > ~20 ft) Assistive device: Rolling walker (2 wheels) Gait Pattern/deviations: Step-through pattern, Decreased step length - right, Decreased step length - left, Decreased stride length, Trunk flexed, Drifts right/left Gait velocity: reduced Gait velocity interpretation: <1.31 ft/sec, indicative of household  ambulator   General Gait Details: Pt ambulates with slow, small steps and a flexed posture. She drifts laterally often needing minA for balance and to direct  her RW to avoid obstacles or to complete a turn. +2 for safety  Stairs            Wheelchair Mobility     Tilt Bed    Modified Rankin (Stroke Patients Only) Modified Rankin (Stroke Patients Only) Pre-Morbid Rankin Score: No symptoms Modified Rankin: Moderately severe disability     Balance Overall balance assessment: Needs assistance Sitting-balance support: No upper extremity supported, Feet supported Sitting balance-Leahy Scale: Fair Sitting balance - Comments: supervision sitting statically EOB, able to perform anterior pericare when sitting on commode with CGA for safety   Standing balance support: Bilateral upper extremity supported, During functional activity, Reliant on assistive device for balance Standing balance-Leahy Scale: Poor Standing balance comment: reliant on RW and external physical assistance, posterior lean noted initially                             Pertinent Vitals/Pain Pain Assessment Pain Assessment: Faces Faces Pain Scale: No hurt Pain Intervention(s): Monitored during session    Home Living Family/patient expects to be discharged to:: Private residence Living Arrangements: Alone Available Help at Discharge: Family Type of Home: Mobile home Home Access:  (pt unable to provide answer on STE)       Home Layout: One level Home Equipment: None Additional Comments: On 2-3L O2 at baseline per pt; unsure of accuracy of home info as pt does not appear to be a reliable historian at this time    Prior Function Prior Level of Function : Independent/Modified Independent;Patient poor historian/Family not available             Mobility Comments: No AD per pt       Extremity/Trunk Assessment   Upper Extremity Assessment Upper Extremity Assessment: Defer to OT evaluation    Lower Extremity Assessment Lower Extremity Assessment: Generalized weakness (grossly 4/5 symmetrically bil; denied numbness/tingling bil)    Cervical /  Trunk Assessment Cervical / Trunk Assessment: Normal  Communication   Communication Communication: No apparent difficulties    Cognition Arousal: Alert, Lethargic Behavior During Therapy: Flat affect   PT - Cognitive impairments: Orientation, Awareness, Memory, Attention, Initiation, Sequencing, Problem solving, Safety/Judgement   Orientation impairments: Place, Time, Situation                   PT - Cognition Comments: Pt thought it was January 1925, even though able to identify that Thansgiving is coming up. Pt lethargic but alert when upright and mobilizing. Thought she was at [facility]. She follows simple cues inconsistently with extra time. She displays deficits in attention, often getting distracted throughout and needing redirecting often. Following commands: Impaired Following commands impaired: Follows one step commands inconsistently, Follows one step commands with increased time     Cueing Cueing Techniques: Verbal cues, Tactile cues     General Comments General comments (skin integrity, edema, etc.): HR 100-110s when ambulating, but would spontaneously jump up to 150s with max of 176 bpm when sitting, MD made aware    Exercises     Assessment/Plan    PT Assessment Patient needs continued PT services  PT Problem List Decreased strength;Decreased activity tolerance;Decreased mobility;Decreased balance;Decreased cognition;Decreased knowledge of use of DME;Cardiopulmonary status limiting activity       PT Treatment Interventions DME instruction;Gait training;Stair training;Functional mobility  training;Therapeutic activities;Balance training;Therapeutic exercise;Neuromuscular re-education;Cognitive remediation;Patient/family education    PT Goals (Current goals can be found in the Care Plan section)  Acute Rehab PT Goals Patient Stated Goal: to see her sister PT Goal Formulation: With patient Time For Goal Achievement: 07/25/24 Potential to Achieve Goals: Good     Frequency Min 2X/week     Co-evaluation   Reason for Co-Treatment: Necessary to address cognition/behavior during functional activity;For patient/therapist safety;To address functional/ADL transfers PT goals addressed during session: Mobility/safety with mobility;Balance;Proper use of DME OT goals addressed during session: ADL's and self-care       AM-PAC PT 6 Clicks Mobility  Outcome Measure Help needed turning from your back to your side while in a flat bed without using bedrails?: A Little Help needed moving from lying on your back to sitting on the side of a flat bed without using bedrails?: A Little Help needed moving to and from a bed to a chair (including a wheelchair)?: A Little Help needed standing up from a chair using your arms (e.g., wheelchair or bedside chair)?: A Lot Help needed to walk in hospital room?: A Little Help needed climbing 3-5 steps with a railing? : Total 6 Click Score: 15    End of Session Equipment Utilized During Treatment: Gait belt;Oxygen  Activity Tolerance: Patient tolerated treatment well Patient left: in chair;with call bell/phone within reach;with chair alarm set Nurse Communication: Mobility status;Other (comment) (HR - notified MD) PT Visit Diagnosis: Unsteadiness on feet (R26.81);Other abnormalities of gait and mobility (R26.89);Muscle weakness (generalized) (M62.81);Difficulty in walking, not elsewhere classified (R26.2)    Time: 8949-8877 PT Time Calculation (min) (ACUTE ONLY): 32 min   Charges:   PT Evaluation $PT Eval Moderate Complexity: 1 Mod   PT General Charges $$ ACUTE PT VISIT: 1 Visit         Yvonne Todd, PT, DPT Acute Rehabilitation Services  Office: [phone]   Yvonne Todd 07/11/2024, 11:44 AM

## 2024-07-11 NOTE — [place] FL2 (Signed)
 [place]  MEDICAID FL2 LEVEL OF CARE FORM     IDENTIFICATION  Patient Name: Yvonne Todd Birthdate: August 19, 1951 Sex: female Admission Date (Current Location): 07/08/2024  [facility] and [place] Number:  Producer, Television/film/video and Address:  [facility] [place], [address] [place], [place] [address]      Provider Number: [id number]  Attending Physician Name and Address:  Raenelle Donalda HERO, MD  Relative Name and Phone Number:       Current Level of Care: Hospital Recommended Level of Care: Skilled Nursing Facility Prior Approval Number:    Date Approved/Denied:   PASRR Number: [id number]  Discharge Plan: SNF    Current Diagnoses: Patient Active Problem List   Diagnosis Date Noted   Encephalopathy acute 07/08/2024   Pain due to onychomycosis of toenails of both feet 09/08/2023   Neuropathy due to type 2 diabetes mellitus (HCC) 09/15/2022   B12 deficiency 03/08/2021   Foot swelling 02/18/2021   PVD (peripheral vascular disease) 02/18/2021   Abnormal findings on diagnostic imaging of lung 03/26/2018   Allergic rhinitis 03/26/2018   Chronic diastolic CHF (congestive heart failure) (HCC) 04/13/2017   Cardiomegaly 04/13/2017   Chronic respiratory failure with hypoxia (HCC) 06/18/2015   Routine general medical examination at a health care facility 02/21/2015   GOLD COPD IV D 07/06/2014   Essential hypertension 04/27/2014   Traumatic arthritis of right knee 04/27/2014   Former tobacco use 03/20/2014   Hyperlipidemia associated with type 2 diabetes mellitus (HCC) 03/20/2014   Left carotid bruit 03/20/2014   Type 2 diabetes with complication (HCC) 03/20/2014    Orientation RESPIRATION BLADDER Height & Weight     Self  O2 (3L o2 [place]) Incontinent Weight: 197 lb 15.6 oz (89.8 kg) Height:  5' 2 (157.5 cm)  BEHAVIORAL SYMPTOMS/MOOD NEUROLOGICAL BOWEL NUTRITION STATUS      Continent Diet (See D/C Summary)  AMBULATORY STATUS COMMUNICATION OF NEEDS Skin    Limited Assist Verbally Normal                       Personal Care Assistance Level of Assistance  Bathing, Feeding, Dressing Bathing Assistance: Limited assistance Feeding assistance: Limited assistance Dressing Assistance: Limited assistance     Functional Limitations Info  Sight Sight Info: Impaired        SPECIAL CARE FACTORS FREQUENCY  PT (By licensed PT), OT (By licensed OT)     PT Frequency: 5x/week OT Frequency: 5x/week            Contractures Contractures Info: Not present    Additional Factors Info  Allergies, Code Status Code Status Info: Full Allergies Info: Fish Allergy, Iodine, Penicillins, Shellfish Protein-containing Drug Products, Tetracyclines & Related           Current Medications (07/11/2024):  This is the current hospital active medication list Current Facility-Administered Medications  Medication Dose Route Frequency Provider Last Rate Last Admin   acetaminophen  (TYLENOL ) tablet 650 mg  650 mg Oral Q6H PRN Arthea Child, MD       Or   acetaminophen  (TYLENOL ) suppository 650 mg  650 mg Rectal Q6H PRN Arthea Child, MD       arformoterol [facility]) nebulizer solution 15 mcg  15 mcg Nebulization BID Raenelle Donalda HERO, MD   15 mcg at 07/11/24 0735   atorvastatin  (LIPITOR) tablet 80 mg  80 mg Oral QHS Ghimire, Donalda HERO, MD       bisacodyl  (DULCOLAX) EC tablet 5 mg  5  mg Oral Daily PRN Claiborne, Claudia, MD       budesonide  (PULMICORT ) nebulizer solution 0.25 mg  0.25 mg Nebulization BID Raenelle Grew M, MD   0.25 mg at 07/11/24 0735   famotidine (PEPCID) tablet 40 mg  40 mg Oral Daily Raenelle Grew HERO, MD   40 mg at 07/11/24 1215   fluticasone  (FLONASE ) 50 MCG/ACT nasal spray 2 spray  2 spray Each Nare Daily Raenelle Grew HERO, MD   2 spray at 07/11/24 1249   haloperidol lactate (HALDOL) injection 5 mg  5 mg Intravenous Q6H PRN Odell Celinda Balo, MD       insulin  aspart (novoLOG ) injection 0-15 Units  0-15 Units  Subcutaneous Q4H Odell Celinda Balo, MD   3 Units at 07/11/24 1017   insulin  glargine-yfgn (SEMGLEE ) injection 25 Units  25 Units Subcutaneous Daily Raenelle Grew HERO, MD   25 Units at 07/11/24 1017   melatonin tablet 5 mg  5 mg Oral QHS Odell Celinda Balo, MD       montelukast  (SINGULAIR ) tablet 10 mg  10 mg Oral QHS Ghimire, Grew HERO, MD       ondansetron  (ZOFRAN ) tablet 4 mg  4 mg Oral Q6H PRN Arthea Child, MD       Or   ondansetron  (ZOFRAN ) injection 4 mg  4 mg Intravenous Q6H PRN Claiborne, Claudia, MD       revefenacin (YUPELRI) nebulizer solution 175 mcg  175 mcg Nebulization Daily Raenelle Grew HERO, MD   175 mcg at 07/11/24 0735     Discharge Medications: Please see discharge summary for a list of discharge medications.  Relevant Imaging Results:  Relevant Lab Results:   Additional Information SSN [id number]  Dec[place]le [place], [place]

## 2024-07-11 NOTE — Progress Notes (Addendum)
 PROGRESS NOTE        PATIENT DETAILS Name: Yvonne Todd Age: 73 y.o. Sex: female Date of Birth: [birth year] Admit Date: 07/08/2024 Admitting Physician Will Bernard, MD ERE:[email or web address], Almarie LABOR, MD  Brief Summary: Patient is a 73 y.o.  female with history of COPD on 3 L of oxygen , DM-2, asthma-who was found by her sister with altered at home-she was subsequently brought to the ED and admitted to the hospitalist service.  See below for further details  Significant events: 11/7>> admit to [facility]  Significant studies: 11/7>> CT head: No acute intracranial abnormality 11/7>> CT abdomen/pelvis: No acute findings.  Significant microbiology data: 11/7>> blood culture: No growth 11/8>> CSF culture: Negative 11/8>> CSF meningitis panel: Negative 11/9>> CSF WBC-18, RBC 22275, protein 72  Procedures: 11/8>> LP under fluoroscopy (aborted due to agitation-only 0.5 cc of CSF obtained) 11/9>> repeat LP under fluoroscopy-bloody tap.  Consults: None  Subjective: Working with therapy-appears slightly sleepy but awake/alert-answering all questions appropriately.  Denies any nausea, vomiting or diarrhea.  Came back from MRI earlier this morning-results still pending.  Objective: Vitals: Blood pressure 124/64, pulse 91, temperature 97.8 F (36.6 C), temperature source Oral, resp. rate 20, height 5' 2 (1.575 m), weight 89.8 kg, SpO2 98%.   Exam: Gen Exam:Alert awake-not in any distress HEENT:atraumatic, normocephalic Chest: B/L clear to auscultation anteriorly CVS:S1S2 regular Abdomen:soft non tender, non distended Extremities:no edema Neurology: Non focal Skin: no rash  Pertinent Labs/Radiology:    Latest Ref Rng & Units 07/11/2024    4:18 AM 07/10/2024    7:11 AM 07/09/2024    4:14 AM  CBC  WBC 4.0 - 10.5 K/uL 8.1  8.6  10.7   Hemoglobin 12.0 - 15.0 g/dL 89.2  87.6  86.4   Hematocrit 36.0 - 46.0 % 32.1  36.9  39.7   Platelets 150 - 400 K/uL 290  314   353     Lab Results  Component Value Date   NA 141 07/11/2024   K 3.0 (L) 07/11/2024   CL 104 07/11/2024   CO2 27 07/11/2024      Assessment/Plan: Acute metabolic encephalopathy Unclear etiology-but suspect this is mostly metabolic from hyperglycemia/mild DKA/AKI-but due to concern for encephalitis-was placed on broad-spectrum antimicrobial therapy.  S/p LP x 2-CSF studies as above-low suspicion for infection-all antibiotics discontinued-on acyclovir due to some concern that she could have varicella-zoster encephalitis-awaiting MRI brain/CSF varicella IgG. ID following Thankfully clinically improved-awake/alert-answering questions appropriately.    Addendum: MRI findings d/w Dr Emile related to bloody tap on 11/9-recommends CT head in am, d/w Dr Jaun MD-d/c Acyclovir and monitor  DKA Was very mild-resolved with IV fluids and insulin -did not have to start insulin  infusion  AKI Probably related to osmotic diuresis secondary to DKA/probable some hemodynamic mediated injury as well Improving with supportive care  Hypokalemia Replete/recheck.  Hypernatremia Resolved supportive care.  DM-2 (A1c 6.7 on 11/8) CBGs better controlled Change Lantus  to daily dosing Continue SSI/follow/optimize.   Recent Labs    07/10/24 2017 07/11/24 0024 07/11/24 0806  GLUCAP 111* 101* 160*    COPD with chronic hypoxic respiratory failure-on 2-3L of oxygen  Stable-not in exacerbation Continue to-3 L of oxygen  Continue bronchodilators  HTN BP stable Continue to hold valsartan  for now Resume when able.  PAD Unclear if she is taking aspirin . Continue statin.  Scattered pulmonary nodules-largest 6 mm left lower  lobe Incidental finding on CT imaging Radiology recommending noncontrast CT in 3 to 6 months.  Class 2 Obesity: Estimated body mass index is 36.21 kg/m as calculated from the following:   Height as of this encounter: 5' 2 (1.575 m).   Weight as of this  encounter: 89.8 kg.   Code status:   Code Status: Full Code   DVT Prophylaxis: SCD's for now-resume pharmacological prophylaxis once we know she does not need repeat LP.   Family Communication: Brother in law--David [phone] updated over the phone-Sister was unavailable.   Disposition Plan: Status is: Inpatient Remains inpatient appropriate because: Severity of illness   Planned Discharge Destination:Home health   Diet: Diet Order             Diet heart healthy/carb modified Fluid consistency: Thin  Diet effective now                     Antimicrobial agents: Anti-infectives (From admission, onward)    Start     Dose/Rate Route Frequency Ordered Stop   07/10/24 2300  acyclovir (ZOVIRAX) 900 mg in dextrose  5 % 250 mL IVPB  Status:  Discontinued        10 mg/kg  89.8 kg 268 mL/hr over 60 Minutes Intravenous Every 8 hours 07/10/24 2136 07/10/24 2139   07/10/24 2300  acyclovir (ZOVIRAX) 660 mg in dextrose  5 % 100 mL IVPB        10 mg/kg  66 kg (Adjusted) 113.2 mL/hr over 60 Minutes Intravenous Every 8 hours 07/10/24 2139     07/09/24 1800  cefTRIAXone  (ROCEPHIN ) 2 g in sodium chloride  0.9 % 100 mL IVPB  Status:  Discontinued        2 g 200 mL/hr over 30 Minutes Intravenous Every 24 hours 07/08/24 2249 07/08/24 2255   07/09/24 1530  ampicillin (OMNIPEN) 2 g in sodium chloride  0.9 % 100 mL IVPB  Status:  Discontinued        2 g 300 mL/hr over 20 Minutes Intravenous Every 6 hours 07/09/24 1516 07/10/24 0933   07/09/24 1200  vancomycin (VANCOREADY) IVPB 750 mg/150 mL  Status:  Discontinued        750 mg 150 mL/hr over 60 Minutes Intravenous Every 12 hours 07/08/24 2259 07/10/24 0933   07/09/24 1000  acyclovir (ZOVIRAX) 900 mg in dextrose  5 % 250 mL IVPB  Status:  Discontinued        10 mg/kg  89.8 kg 268 mL/hr over 60 Minutes Intravenous Every 12 hours 07/09/24 0905 07/10/24 2136   07/09/24 0400  cefTRIAXone  (ROCEPHIN ) 2 g in sodium chloride  0.9 % 100 mL IVPB   Status:  Discontinued        2 g 200 mL/hr over 30 Minutes Intravenous Every 12 hours 07/08/24 2255 07/10/24 0933   07/08/24 2300  vancomycin (VANCOREADY) IVPB 1750 mg/350 mL        1,750 mg 175 mL/hr over 120 Minutes Intravenous  Once 07/08/24 2256 07/09/24 0427   07/08/24 1600  cefTRIAXone  (ROCEPHIN ) 1 g in sodium chloride  0.9 % 100 mL IVPB        1 g 200 mL/hr over 30 Minutes Intravenous  Once 07/08/24 1547 07/08/24 1945        MEDICATIONS: Scheduled Meds:  arformoterol  15 mcg Nebulization BID   budesonide  (PULMICORT ) nebulizer solution  0.25 mg Nebulization BID   insulin  aspart  0-15 Units Subcutaneous Q4H   insulin  glargine-yfgn  25 Units Subcutaneous Daily   melatonin  5 mg Oral QHS   revefenacin  175 mcg Nebulization Daily   Continuous Infusions:  sodium chloride  100 mL/hr at 07/10/24 0332   acyclovir 660 mg (07/11/24 0528)   PRN Meds:.acetaminophen  **OR** acetaminophen , bisacodyl , haloperidol lactate, LORazepam, ondansetron  **OR** ondansetron  (ZOFRAN ) IV   I have personally reviewed following labs and imaging studies  LABORATORY DATA: CBC: Recent Labs  Lab 07/08/24 1533 07/08/24 1607 07/08/24 1608 07/09/24 0414 07/10/24 0711 07/11/24 0418  WBC 10.4  --   --  10.7* 8.6 8.1  NEUTROABS 8.3*  --   --   --   --   --   HGB 14.7 12.9 13.9 13.5 12.3 10.7*  HCT 43.4 38.0 41.0 39.7 36.9 32.1*  MCV 90.8  --   --  91.1 92.0 92.0  PLT 469*  --   --  353 314 290    Basic Metabolic Panel: Recent Labs  Lab 07/08/24 1533 07/08/24 1607 07/08/24 1608 07/09/24 0414 07/10/24 0711 07/11/24 0418  NA 138 141 142 140 146* 141  K 3.7 3.3* 3.4* 3.3* 3.1* 3.0*  CL 101  --  108 104 107 104  CO2 18*  --   --  21* 25 27  GLUCOSE 415*  --  398* 257* 139* 183*  BUN 102*  --  95* 69* 22 12  CREATININE 1.55*  --  1.30* 1.13* 0.77 0.58  CALCIUM  8.5*  --   --  9.1 8.6* 8.2*  MG  --   --   --  2.7*  --   --     GFR: Estimated Creatinine Clearance: 64.3 mL/min (by C-G formula  based on SCr of 0.58 mg/dL).  Liver Function Tests: Recent Labs  Lab 07/08/24 1533 07/09/24 0414 07/10/24 0711 07/11/24 0418  AST 19 17 20 17   ALT 21 22 25 20   ALKPHOS 85 78 68 60  BILITOT 1.0 0.8 0.3 0.3  PROT 7.1 6.5 5.9* 5.4*  ALBUMIN  3.6 3.3* 2.7* 2.3*   No results for input(s): LIPASE, AMYLASE in the last 168 hours. Recent Labs  Lab 07/10/24 0711  AMMONIA 20    Coagulation Profile: No results for input(s): INR, PROTIME in the last 168 hours.  Cardiac Enzymes: Recent Labs  Lab 07/08/24 1533  CKTOTAL 78    BNP (last 3 results) No results for input(s): PROBNP in the last 8760 hours.  Lipid Profile: No results for input(s): CHOL, HDL, LDLCALC, TRIG, CHOLHDL, LDLDIRECT in the last 72 hours.  Thyroid  Function Tests: No results for input(s): TSH, T4TOTAL, FREET4, T3FREE, THYROIDAB in the last 72 hours.  Anemia Panel: Recent Labs    07/10/24 0711  VITAMINB12 983*    Urine analysis:    Component Value Date/Time   COLORURINE YELLOW 07/08/2024 1502   APPEARANCEUR HAZY (A) 07/08/2024 1502   LABSPEC 1.022 07/08/2024 1502   PHURINE 5.0 07/08/2024 1502   GLUCOSEU >=500 (A) 07/08/2024 1502   HGBUR SMALL (A) 07/08/2024 1502   BILIRUBINUR NEGATIVE 07/08/2024 1502   KETONESUR 5 (A) 07/08/2024 1502   PROTEINUR 30 (A) 07/08/2024 1502   UROBILINOGEN 0.2 06/23/2014 1222   NITRITE NEGATIVE 07/08/2024 1502   LEUKOCYTESUR NEGATIVE 07/08/2024 1502    Sepsis Labs: Lactic Acid, Venous    Component Value Date/Time   LATICACIDVEN 1.1 07/09/2024 0428    MICROBIOLOGY: Recent Results (from the past 240 hours)  Blood culture (routine x 2)     Status: None (Preliminary result)   Collection Time: 07/08/24  3:48 PM   Specimen: BLOOD RIGHT  FOREARM  Result Value Ref Range Status   Specimen Description BLOOD RIGHT FOREARM  Final   Special Requests   Final    BOTTLES DRAWN AEROBIC AND ANAEROBIC Blood Culture results may not be optimal due to  an inadequate volume of blood received in culture bottles   Culture   Final    NO GROWTH 3 DAYS Performed at [facility], [address] [place], [place] [address]    Report Status PENDING  Incomplete  Blood culture (routine x 2)     Status: None (Preliminary result)   Collection Time: 07/08/24  5:50 PM   Specimen: BLOOD RIGHT HAND  Result Value Ref Range Status   Specimen Description BLOOD RIGHT HAND  Final   Special Requests   Final    BOTTLES DRAWN AEROBIC AND ANAEROBIC Blood Culture results may not be optimal due to an inadequate volume of blood received in culture bottles   Culture   Final    NO GROWTH 3 DAYS Performed at [facility], [address] [place], [place] [address]    Report Status PENDING  Incomplete  CSF culture w Gram Stain     Status: None (Preliminary result)   Collection Time: 07/09/24  1:42 PM   Specimen: PATH Cytology CSF; Cerebrospinal Fluid  Result Value Ref Range Status   Specimen Description CSF  Final   Special Requests NONE  Final   Gram Stain   Final    FEW WBC PRESENT, PREDOMINANTLY MONONUCLEAR NO ORGANISMS SEEN    Culture   Final    NO GROWTH 2 DAYS Performed at [facility], [address], [place], [place] [address]    Report Status PENDING  Incomplete    RADIOLOGY STUDIES/RESULTS: DG FL GUIDED LUMBAR PUNCTURE Result Date: 07/10/2024 CLINICAL DATA:  73 year old female currently admitted for acute encephalopathy. Patient underwent lumbar puncture on 07/09/2024; however, was quickly aborted as patient was very agitated during the exam. Request for repeat lumbar puncture. EXAM: LUMBAR PUNCTURE UNDER FLUOROSCOPY PROCEDURE: An appropriate skin entry site was determined fluoroscopically. Operator donned sterile gloves and mask. Skin site was marked, then prepped with Betadine, draped in usual sterile fashion, and infiltrated locally with 1% lidocaine . A 20 gauge spinal needle advanced into the thecal sac at L3-4 from a right  interlaminar approach. Blood-tinged CSF spontaneously returned, with opening pressure of 12 cm water. 6 ml CSF were collected and divided among 3 sterile vials for the requested laboratory studies. The needle was then removed. The patient tolerated the procedure well and there were no complications. FLUOROSCOPY: Radiation Exposure Index (as provided by the fluoroscopic device): 36.9 mGy Kerma IMPRESSION: Technically successful lumbar puncture under fluoroscopy. This exam was performed by [facility] PA-C, and was supervised and interpreted by Dr. VEAR Lent. Electronically Signed   By: Wilkie Lent M.D.   On: 07/10/2024 15:59   DG FL GUIDED LUMBAR PUNCTURE Result Date: 07/09/2024 CLINICAL DATA:  73 year old female admitted with altered mental status. IR was requested for lumbar puncture. EXAM: LUMBAR PUNCTURE UNDER FLUOROSCOPY PROCEDURE: Patient received 1 mg Ativan IV prior to procedure, as ordered by patient's admitting physician. An appropriate skin entry site was determined fluoroscopically. Operator donned sterile gloves and mask. Skin site was marked, then prepped with Betadine, draped in usual sterile fashion, and infiltrated locally with 1% lidocaine . A 20 gauge spinal needle advanced into the thecal sac at L4-L5 from a left interlaminar approach. Minimally blood-tinged CSF spontaneously returned, with opening pressure of  12 cm water. 0.5 mL ml CSF were collected. However, the procedure was soon aborted, as patient remained agitated despite Ativan given, which precluded safe completion of lumbar puncture. The patient tolerated the procedure poorly, though there were no discerned complications. FLUOROSCOPY: Radiation Exposure Index (as provided by the fluoroscopic device): 17.00 mGy Kerma IMPRESSION: Technically successful lumbar puncture under fluoroscopy. This exam was performed by Carlin Griffon, PA-C, and was supervised and interpreted by Dr. Wilkie Lent, MD. Electronically Signed   By:  Wilkie Lent M.D.   On: 07/09/2024 15:00     LOS: 3 days   Donalda Applebaum, MD  [facility]    To contact the attending provider between 7A-7P or the covering provider during after hours 7P-7A, please log into the web site [email or web address] and access using universal [place] password for that web site. If you do not have the password, please call the hospital operator.  07/11/2024, 11:11 AM

## 2024-07-12 ENCOUNTER — Inpatient Hospital Stay (HOSPITAL_COMMUNITY)

## 2024-07-12 ENCOUNTER — Ambulatory Visit

## 2024-07-12 ENCOUNTER — Telehealth: Payer: Self-pay

## 2024-07-12 DIAGNOSIS — J9611 Chronic respiratory failure with hypoxia: Secondary | ICD-10-CM | POA: Diagnosis not present

## 2024-07-12 DIAGNOSIS — E118 Type 2 diabetes mellitus with unspecified complications: Secondary | ICD-10-CM | POA: Diagnosis not present

## 2024-07-12 DIAGNOSIS — G9340 Encephalopathy, unspecified: Secondary | ICD-10-CM | POA: Diagnosis not present

## 2024-07-12 LAB — MAGNESIUM: Magnesium: 2.1 mg/dL (ref 1.7–2.4)

## 2024-07-12 LAB — GLUCOSE, CAPILLARY
Glucose-Capillary: 145 mg/dL — ABNORMAL HIGH (ref 70–99)
Glucose-Capillary: 142 mg/dL — ABNORMAL HIGH (ref 70–99)
Glucose-Capillary: 185 mg/dL — ABNORMAL HIGH (ref 70–99)
Glucose-Capillary: 198 mg/dL — ABNORMAL HIGH (ref 70–99)
Glucose-Capillary: 197 mg/dL — ABNORMAL HIGH (ref 70–99)

## 2024-07-12 LAB — CSF CULTURE W GRAM STAIN: Culture: NO GROWTH

## 2024-07-12 LAB — BASIC METABOLIC PANEL WITH GFR
Anion gap: 12 (ref 5–15)
BUN: 6 mg/dL — ABNORMAL LOW (ref 8–23)
CO2: 30 mmol/L (ref 22–32)
Calcium: 8.4 mg/dL — ABNORMAL LOW (ref 8.9–10.3)
Chloride: 99 mmol/L (ref 98–111)
Creatinine, Ser: 0.62 mg/dL (ref 0.44–1.00)
GFR, Estimated: >60 mL/min (ref >60)
Glucose, Bld: 122 mg/dL — ABNORMAL HIGH (ref 70–99)
Potassium: 3.2 mmol/L — ABNORMAL LOW (ref 3.5–5.1)
Sodium: 141 mmol/L (ref 135–145)

## 2024-07-12 LAB — CYTOLOGY - NON PAP

## 2024-07-12 MED ORDER — POTASSIUM CHLORIDE CRYS ER 20 MEQ PO TBCR
40.0000 meq | EXTENDED_RELEASE_TABLET | Freq: Once | ORAL | Status: AC
  Administered 2024-07-12: 40 meq via ORAL
  Filled 2024-07-12: qty 2

## 2024-07-12 NOTE — Progress Notes (Signed)
 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 Initially started on broad spectrum abtx and antivirals as part of meningitis work up on 11/7. She underwent dry tap, very small volume of 0.68mL CSF drawn on 11/8 that showed negative ME panel. Antibacterials discontinued, patient also having improved mentation. She underwent repeat LP on 11/9- again very low volume CSF, found to have traumatic tap. RBC of 22,275, WBC 18, 74N, 21L, protein 72, and glucose 84. When correcting for RBC, she has negligible WBC in CSF. Made recommendations to stop acyclovir and watch off of antimicrobial/antiviral therapy. Acyclovir discontinued on 11/10. EEG negative for seizure. NCHCT no acute intracranial abn on CT, only chronic small vessell disease  A/P: 73yo with acute metabolic encephalopathy likely from DKA, AKI, and polypharmacy. Recommend to continue to monitor off of antimicrobial/antiviral therapy and continue supportive care.  Will sign offf.  Montie FURY Luiz MD MPH [facility] [phone]

## 2024-07-12 NOTE — Progress Notes (Addendum)
   07/12/24 1133  Mobility  Activity Ambulated with assistance  Level of Assistance Contact guard assist, steadying assist  Assistive Device Front wheel walker  Distance Ambulated (ft) 10 ft  Activity Response Tolerated fair  Mobility Referral Yes  Mobility visit 1 Mobility  Mobility Specialist Start Time (ACUTE ONLY) 1133  Mobility Specialist Stop Time (ACUTE ONLY) 1207  Mobility Specialist Time Calculation (min) (ACUTE ONLY) 34 min   Mobility Specialist: Progress Note  Pre-Mobility:      SpO2 99% 5L During Mobility: SpO2 85% RA , BP 127/64 (82) Post-Mobility:    SpO2 93% 5L, BP 140/ 69 (87)  Pt agreeable to mobility session - received in bed. C/o dizziness Returned to chair with all needs met - call bell within reach. Chair alarm on.   Additional comments: Pt placed on portable O2 - 6L, O2 tank became empty during mobility. Sister present.  Virgle Boards, BS Mobility Specialist Please contact via SecureChat or  Rehab office at [phone].

## 2024-07-12 NOTE — Progress Notes (Signed)
 PROGRESS NOTE        PATIENT DETAILS Name: Yvonne Todd Age: 73 y.o. Sex: female Date of Birth: Jun 28, 1951 Admit Date: 07/08/2024 Admitting Physician Will Bernard, MD ERE:[id number], Almarie LABOR, MD  Brief Summary: Patient is a 73 y.o.  female with history of COPD on 3 L of oxygen , DM-2, asthma-who was found by her sister with altered at home-she was subsequently brought to the ED and admitted to the hospitalist service.  See below for further details  Significant events: 11/7>> admit to [facility]  Significant studies: 11/7>> CT head: No acute intracranial abnormality 11/7>> CT abdomen/pelvis: No acute findings.  Significant microbiology data: 11/7>> blood culture: No growth 11/8>> CSF culture: Negative 11/8>> CSF meningitis panel: Negative 11/9>> CSF WBC-18, RBC 22275, protein 72  Procedures: 11/8>> LP under fluoroscopy (aborted due to agitation-only 0.5 cc of CSF obtained) 11/9>> repeat LP under fluoroscopy-bloody tap.  Consults: None  Subjective: No major issues overnight-awake/alert.  Afebrile.  Objective: Vitals: Blood pressure 134/76, pulse 73, temperature 98 F (36.7 C), temperature source Oral, resp. rate 20, height 5' 2 (1.575 m), weight 89.8 kg, SpO2 96%.   Exam: Gen Exam:Alert awake-not in any distress HEENT:atraumatic, normocephalic Chest: B/L clear to auscultation anteriorly CVS:S1S2 regular Abdomen:soft non tender, non distended Extremities:no edema Neurology: Non focal Skin: no rash  Pertinent Labs/Radiology:    Latest Ref Rng & Units 07/11/2024    4:18 AM 07/10/2024    7:11 AM 07/09/2024    4:14 AM  CBC  WBC 4.0 - 10.5 K/uL 8.1  8.6  10.7   Hemoglobin 12.0 - 15.0 g/dL 89.2  87.6  86.4   Hematocrit 36.0 - 46.0 % 32.1  36.9  39.7   Platelets 150 - 400 K/uL 290  314  353     Lab Results  Component Value Date   NA 141 07/12/2024   K 3.2 (L) 07/12/2024   CL 99 07/12/2024   CO2 30 07/12/2024       Assessment/Plan: Acute metabolic encephalopathy Unclear etiology-but suspect this is mostly metabolic from hyperglycemia/mild DKA/AKI-but due to concern for encephalitis-was placed on broad-spectrum antimicrobial therapy.  S/p LP x 2-CSF studies as above-low suspicion for infection-off all antimicrobials-Acyclovir discontinued on 11/10.  Stable overnight-continue to monitor off antibiotics/antimicrobial agents.      DKA Was very mild-resolved with IV fluids and insulin -did not have to start insulin  infusion  AKI Probably related to osmotic diuresis secondary to DKA/probable some hemodynamic mediated injury as well Improving with supportive care  Hypokalemia Replete/recheck.  Hypernatremia Resolved supportive care.  DM-2 (A1c 6.7 on 11/8) CBGs better controlled Continue Lantus  25 units daily+ SSI Continue SSI/follow/optimize.   Recent Labs    07/11/24 2355 07/12/24 0428 07/12/24 0811  GLUCAP 158* 145* 142*    COPD with chronic hypoxic respiratory failure-on 2-3L of oxygen  Stable-not in exacerbation Continue to-3 L of oxygen  Continue bronchodilators  HTN BP stable Continue to hold valsartan  for now Resume when able.  PAD Unclear if she is taking aspirin . Continue statin.  Scattered pulmonary nodules-largest 6 mm left lower lobe Incidental finding on CT imaging Radiology recommending noncontrast CT in 3 to 6 months.  Class 2 Obesity: Estimated body mass index is 36.21 kg/m as calculated from the following:   Height as of this encounter: 5' 2 (1.575 m).   Weight as of this encounter: 89.8 kg.  Code status:   Code Status: Full Code   DVT Prophylaxis: SCD's for now-resume pharmacological prophylaxis once we know she does not need repeat LP.   Family Communication: Brother in law--David [phone] updated over the phone on 11/10-Sister was unavailable.   Disposition Plan: Status is: Inpatient Remains inpatient appropriate because: Severity of  illness   Planned Discharge Destination: SNF   Diet: Diet Order             Diet heart healthy/carb modified Fluid consistency: Thin  Diet effective now                     Antimicrobial agents: Anti-infectives (From admission, onward)    Start     Dose/Rate Route Frequency Ordered Stop   07/10/24 2300  acyclovir (ZOVIRAX) 900 mg in dextrose  5 % 250 mL IVPB  Status:  Discontinued        10 mg/kg  89.8 kg 268 mL/hr over 60 Minutes Intravenous Every 8 hours 07/10/24 2136 07/10/24 2139   07/10/24 2300  acyclovir (ZOVIRAX) 660 mg in dextrose  5 % 100 mL IVPB  Status:  Discontinued        10 mg/kg  66 kg (Adjusted) 113.2 mL/hr over 60 Minutes Intravenous Every 8 hours 07/10/24 2139 07/11/24 1357   07/09/24 1800  cefTRIAXone  (ROCEPHIN ) 2 g in sodium chloride  0.9 % 100 mL IVPB  Status:  Discontinued        2 g 200 mL/hr over 30 Minutes Intravenous Every 24 hours 07/08/24 2249 07/08/24 2255   07/09/24 1530  ampicillin (OMNIPEN) 2 g in sodium chloride  0.9 % 100 mL IVPB  Status:  Discontinued        2 g 300 mL/hr over 20 Minutes Intravenous Every 6 hours 07/09/24 1516 07/10/24 0933   07/09/24 1200  vancomycin (VANCOREADY) IVPB 750 mg/150 mL  Status:  Discontinued        750 mg 150 mL/hr over 60 Minutes Intravenous Every 12 hours 07/08/24 2259 07/10/24 0933   07/09/24 1000  acyclovir (ZOVIRAX) 900 mg in dextrose  5 % 250 mL IVPB  Status:  Discontinued        10 mg/kg  89.8 kg 268 mL/hr over 60 Minutes Intravenous Every 12 hours 07/09/24 0905 07/10/24 2136   07/09/24 0400  cefTRIAXone  (ROCEPHIN ) 2 g in sodium chloride  0.9 % 100 mL IVPB  Status:  Discontinued        2 g 200 mL/hr over 30 Minutes Intravenous Every 12 hours 07/08/24 2255 07/10/24 0933   07/08/24 2300  vancomycin (VANCOREADY) IVPB 1750 mg/350 mL        1,750 mg 175 mL/hr over 120 Minutes Intravenous  Once 07/08/24 2256 07/09/24 0427   07/08/24 1600  cefTRIAXone  (ROCEPHIN ) 1 g in sodium chloride  0.9 % 100 mL IVPB         1 g 200 mL/hr over 30 Minutes Intravenous  Once 07/08/24 1547 07/08/24 1945        MEDICATIONS: Scheduled Meds:  arformoterol  15 mcg Nebulization BID   atorvastatin   80 mg Oral QHS   budesonide  (PULMICORT ) nebulizer solution  0.25 mg Nebulization BID   famotidine  40 mg Oral Daily   fluticasone   2 spray Each Nare Daily   insulin  aspart  0-15 Units Subcutaneous Q4H   insulin  glargine-yfgn  25 Units Subcutaneous Daily   melatonin  5 mg Oral QHS   montelukast   10 mg Oral QHS   revefenacin  175 mcg Nebulization Daily  Continuous Infusions:   PRN Meds:.acetaminophen  **OR** acetaminophen , bisacodyl , haloperidol lactate, ondansetron  **OR** ondansetron  (ZOFRAN ) IV   I have personally reviewed following labs and imaging studies  LABORATORY DATA: CBC: Recent Labs  Lab 07/08/24 1533 07/08/24 1607 07/08/24 1608 07/09/24 0414 07/10/24 0711 07/11/24 0418  WBC 10.4  --   --  10.7* 8.6 8.1  NEUTROABS 8.3*  --   --   --   --   --   HGB 14.7 12.9 13.9 13.5 12.3 10.7*  HCT 43.4 38.0 41.0 39.7 36.9 32.1*  MCV 90.8  --   --  91.1 92.0 92.0  PLT 469*  --   --  353 314 290    Basic Metabolic Panel: Recent Labs  Lab 07/08/24 1533 07/08/24 1607 07/08/24 1608 07/09/24 0414 07/10/24 0711 07/11/24 0418 07/12/24 0415  NA 138   < > 142 140 146* 141 141  K 3.7   < > 3.4* 3.3* 3.1* 3.0* 3.2*  CL 101  --  108 104 107 104 99  CO2 18*  --   --  21* 25 27 30   GLUCOSE 415*  --  398* 257* 139* 183* 122*  BUN 102*  --  95* 69* 22 12 6*  CREATININE 1.55*  --  1.30* 1.13* 0.77 0.58 0.62  CALCIUM  8.5*  --   --  9.1 8.6* 8.2* 8.4*  MG  --   --   --  2.7*  --   --  2.1   < > = values in this interval not displayed.    GFR: Estimated Creatinine Clearance: 64.3 mL/min (by C-G formula based on SCr of 0.62 mg/dL).  Liver Function Tests: Recent Labs  Lab 07/08/24 1533 07/09/24 0414 07/10/24 0711 07/11/24 0418  AST 19 17 20 17   ALT 21 22 25 20   ALKPHOS 85 78 68 60  BILITOT 1.0 0.8  0.3 0.3  PROT 7.1 6.5 5.9* 5.4*  ALBUMIN  3.6 3.3* 2.7* 2.3*   No results for input(s): LIPASE, AMYLASE in the last 168 hours. Recent Labs  Lab 07/10/24 0711  AMMONIA 20    Coagulation Profile: No results for input(s): INR, PROTIME in the last 168 hours.  Cardiac Enzymes: Recent Labs  Lab 07/08/24 1533  CKTOTAL 78    BNP (last 3 results) No results for input(s): PROBNP in the last 8760 hours.  Lipid Profile: No results for input(s): CHOL, HDL, LDLCALC, TRIG, CHOLHDL, LDLDIRECT in the last 72 hours.  Thyroid  Function Tests: No results for input(s): TSH, T4TOTAL, FREET4, T3FREE, THYROIDAB in the last 72 hours.  Anemia Panel: Recent Labs    07/10/24 0711  VITAMINB12 983*    Urine analysis:    Component Value Date/Time   COLORURINE YELLOW 07/08/2024 1502   APPEARANCEUR HAZY (A) 07/08/2024 1502   LABSPEC 1.022 07/08/2024 1502   PHURINE 5.0 07/08/2024 1502   GLUCOSEU >=500 (A) 07/08/2024 1502   HGBUR SMALL (A) 07/08/2024 1502   BILIRUBINUR NEGATIVE 07/08/2024 1502   KETONESUR 5 (A) 07/08/2024 1502   PROTEINUR 30 (A) 07/08/2024 1502   UROBILINOGEN 0.2 06/23/2014 1222   NITRITE NEGATIVE 07/08/2024 1502   LEUKOCYTESUR NEGATIVE 07/08/2024 1502    Sepsis Labs: Lactic Acid, Venous    Component Value Date/Time   LATICACIDVEN 1.1 07/09/2024 0428    MICROBIOLOGY: Recent Results (from the past 240 hours)  Blood culture (routine x 2)     Status: None (Preliminary result)   Collection Time: 07/08/24  3:48 PM   Specimen: BLOOD RIGHT FOREARM  Result Value Ref Range Status   Specimen Description BLOOD RIGHT FOREARM  Final   Special Requests   Final    BOTTLES DRAWN AEROBIC AND ANAEROBIC Blood Culture results may not be optimal due to an inadequate volume of blood received in culture bottles   Culture   Final    NO GROWTH 4 DAYS Performed at [facility], [address], [place], [place] [address]    Report Status PENDING   Incomplete  Blood culture (routine x 2)     Status: None (Preliminary result)   Collection Time: 07/08/24  5:50 PM   Specimen: BLOOD RIGHT HAND  Result Value Ref Range Status   Specimen Description BLOOD RIGHT HAND  Final   Special Requests   Final    BOTTLES DRAWN AEROBIC AND ANAEROBIC Blood Culture results may not be optimal due to an inadequate volume of blood received in culture bottles   Culture   Final    NO GROWTH 4 DAYS Performed at [facility], [address], [place], [place] [address]    Report Status PENDING  Incomplete  CSF culture w Gram Stain     Status: None   Collection Time: 07/09/24  1:42 PM   Specimen: PATH Cytology CSF; Cerebrospinal Fluid  Result Value Ref Range Status   Specimen Description CSF  Final   Special Requests NONE  Final   Gram Stain   Final    FEW WBC PRESENT, PREDOMINANTLY MONONUCLEAR NO ORGANISMS SEEN    Culture   Final    NO GROWTH 3 DAYS Performed at [facility], [address] [place], [place] [address]    Report Status 07/12/2024 FINAL  Final    RADIOLOGY STUDIES/RESULTS: CT HEAD WO CONTRAST ( ) Result Date: 07/12/2024 EXAM: CT HEAD WITHOUT CONTRAST 07/12/2024 03:55:43 AM TECHNIQUE: CT of the head was performed without the administration of intravenous contrast. Automated exposure control, iterative reconstruction, and/or weight based adjustment of the mA/kV was utilized to reduce the radiation dose to as low as reasonably achievable. COMPARISON: MRI Brain 07/11/2024 and Head CT 07/08/2024. CLINICAL HISTORY: 73 year old female with mental status change. MRI Brain 07/11/2024 showed trace intraventricular hemorrhage or debris, follow-up CT. FINDINGS: BRAIN AND VENTRICLES: No acute hemorrhage. No evidence of acute infarct. No hydrocephalus. No extra-axial collection. No mass effect or midline shift. No ventriculomegaly. Stable CT appearance of the ventricles, no discrete blood or abnormal density identified layering in the atria  and occipital horns by CT, no transependymal edema. Stable gray-white differentiation with patchy bilateral white matter hypodensity and basal ganglia heterogeneity compatible with chronic small vessel disease and concordant with MRI appearance. No suspicious intracranial vascular hyperdensity. Mild calcified atherosclerosis at the skull base. ORBITS: No acute abnormality. SINUSES: Mild right mastoid effusion/opacification is stable recently. Right tympanic cavity remains clear. Left middle ear, left mastoids, and paranasal sinuses remain well aerated. SOFT TISSUES AND SKULL: No acute soft tissue abnormality. No skull fracture. IMPRESSION: 1. No IVH or acute intracranial abnormality by CT; stable non-contrast CT appearance of the brain since 07/08/2024 with chronic small vessel disease. Electronically signed by: Helayne Hurst MD 07/12/2024 04:22 AM EST RP Workstation: [id number]   EEG adult Result Date: 07/11/2024 Shelton Arlin KIDD, MD     07/11/2024  5:19 PM Patient Name: Yvonne Todd MRN: [id number] Epilepsy Attending: Arlin KIDD Shelton Referring Physician/Provider: Raenelle Donalda HERO, MD Date: 07/11/2024 Duration: 21.22 mins Patient history: 73 year old female with altered mental status.  EEG to alert for seizure.  Level of alertness: Awake/ lethargic AEDs during EEG study: None Technical aspects: This EEG study was done with scalp electrodes positioned according to the 10-20 International system of electrode placement. Electrical activity was reviewed with band pass filter of 1-70Hz , sensitivity of 7 uV/mm, display speed of 30mm/sec with a 60Hz  notched filter applied as appropriate. EEG data were recorded continuously and digitally stored.  Video monitoring was available and reviewed as appropriate. Description: EEG showed continuous generalized predominantly 5 to 8 Hz theta-alpha activity admixed with intermittent 2 to 3 Hz delta slowing. Hyperventilation and photic stimulation were not performed.  Of note,  study was technically difficult due to significant movement and electrode artifact.  ABNORMALITY - Continuous slow, generalized  IMPRESSION: This technically difficult study is suggestive of generalized cerebral dysfunction (encephalopathy). No seizures or epileptiform discharges were seen throughout the recording.  Arlin MALVA Krebs   MR BRAIN W WO CONTRAST Result Date: 07/11/2024 EXAM: MRI BRAIN WITH AND WITHOUT CONTRAST 07/11/2024 09:31:14 AM TECHNIQUE: Multiplanar multisequence MRI of the head/brain was performed with and without the administration of intravenous contrast. CONTRAST: 9 mL of Gadavist. COMPARISON: CT head 07/08/2024. CLINICAL HISTORY: Mental status change, unknown cause. FINDINGS: Exam quality is significantly degraded by motion artifact. BRAIN AND VENTRICLES: There are small foci of hypointensity on susceptibility-weighted images within the atria and occipital horns of the bilateral lateral ventricles with suggestion of trace fluid level within the right occipital horn (series 7, image 11). Ventricular caliber is otherwise within normal limits for the patient's stated age . No acute infarction or definite abnormal intracranial enhancement. No mass effect or midline shift. The sella is unremarkable. Normal flow voids. There is T2/FLAIR hyperintensity within the periventricular and subcortical cerebral white matter, as well as within the pons, likely on the basis of chronic microvascular ischemia. Chronic lacunar infarction anterior right thalamus. ORBITS: Bilateral lens replacement. SINUSES: No acute abnormality. BONES AND SOFT TISSUES: Normal bone marrow signal and enhancement. No acute soft tissue abnormality. Mild right mastoid effusion. IMPRESSION: 1. Significantly motion-degraded exam. 2. Small/ trace layering dependent foci within the lateral ventricles may represent trace intraventricular hemorrhage, possibly related to recent procedures. Differential diagnosis includes infection  (meningitis/ ventriculitis) though is considered less likely in the absence of restricted diffusion or ependymal enhancement. 3. Within the above limitation, no acute infarction, mass effect, or abnormal intracranial enhancement. Electronically signed by: prentice spade 07/11/2024 12:24 PM EST RP Workstation: [id number]   DG FL GUIDED LUMBAR PUNCTURE Result Date: 07/10/2024 CLINICAL DATA:  73 year old female currently admitted for acute encephalopathy. Patient underwent lumbar puncture on 07/09/2024; however, was quickly aborted as patient was very agitated during the exam. Request for repeat lumbar puncture. EXAM: LUMBAR PUNCTURE UNDER FLUOROSCOPY PROCEDURE: An appropriate skin entry site was determined fluoroscopically. Operator donned sterile gloves and mask. Skin site was marked, then prepped with Betadine, draped in usual sterile fashion, and infiltrated locally with 1% lidocaine . A 20 gauge spinal needle advanced into the thecal sac at L3-4 from a right interlaminar approach. Blood-tinged CSF spontaneously returned, with opening pressure of 12 cm water. 6 ml CSF were collected and divided among 3 sterile vials for the requested laboratory studies. The needle was then removed. The patient tolerated the procedure well and there were no complications. FLUOROSCOPY: Radiation Exposure Index (as provided by the fluoroscopic device): 36.9 mGy Kerma IMPRESSION: Technically successful lumbar puncture under fluoroscopy. This exam was performed by [facility] PA-C, and was supervised and interpreted by Dr. VEAR Lent. Electronically Signed   By: Wilkie  Karalee M.D.   On: 07/10/2024 15:59     LOS: 4 days   Donalda Applebaum, MD  [facility]    To contact the attending provider between 7A-7P or the covering provider during after hours 7P-7A, please log into the web site [email or web address] and access using universal [place] password for that web site. If you do not have the password, please call  the hospital operator.  07/12/2024, 10:32 AM

## 2024-07-12 NOTE — Plan of Care (Signed)
   Problem: Nutritional: Goal: Maintenance of adequate nutrition will improve Outcome: Progressing   Problem: Skin Integrity: Goal: Risk for impaired skin integrity will decrease Outcome: Progressing   Problem: Tissue Perfusion: Goal: Adequacy of tissue perfusion will improve Outcome: Progressing

## 2024-07-12 NOTE — TOC Progression Note (Signed)
 Transition of Care [facility]) - Progression Note    Patient Details  Name: Yvonne Todd MRN: [id number] Date of Birth: 1951/07/20  Transition of Care [facility]) CM/SW Contact  Bernardino Dean, [place] Phone Number: 07/12/2024, 2:44 PM  Clinical Narrative:    SNF referrals launched. Reviewed accepting facilities with patient and sister [name] at bedside. Preferences are 1) [facility], 2) [facility], 3) [facility]. [facility] with tentative ability to accept tmrw. Auth obtained via [facility], #[id number] Effective 11/12 - 11/14. Anticipate discharge tomorrow to SNF.     Expected Discharge Plan: Skilled Nursing Facility Barriers to Discharge: Continued Medical Work up               Expected Discharge Plan and Services In-house Referral: Clinical Social Work   Post Acute Care Choice: Skilled Nursing Facility Living arrangements for the past 2 months: Mobile Home                                       Social Drivers of Health (SDOH) Interventions SDOH Screenings   Food Insecurity: No Food Insecurity (07/09/2024)  Housing: Low Risk  (07/09/2024)  Transportation Needs: No Transportation Needs (07/09/2024)  Utilities: Not At Risk (07/09/2024)  Alcohol  Screen: Low Risk  (06/22/2024)  Depression (PHQ2-9): Low Risk  (06/22/2024)  Financial Resource Strain: Low Risk  (06/22/2024)  Physical Activity: Inactive (06/22/2024)  Social Connections: Moderately Isolated (07/09/2024)  Stress: No Stress Concern Present (06/22/2024)  Tobacco Use: Medium Risk (07/09/2024)  Health Literacy: Adequate Health Literacy (06/22/2024)    Readmission Risk Interventions     No data to display

## 2024-07-12 NOTE — Telephone Encounter (Signed)
 Copied from CRM [id number]. Topic: General - Other >> Jul 11, 2024  4:12 PM Dedra B wrote: Reason for CRM: Pt sister, [name] Eke, wanted to let Dr. Rollene know that she missed her appt because she was admitted to the hospital. She said that pt's blood glucose was over 500 when admitted but has since come down.  Pt is still out of it, and the doctors can't figure out what's wrong. They did a spinal tap to rule out meningitis. She said feel free to call her.

## 2024-07-12 NOTE — Plan of Care (Signed)
   Problem: Education: Goal: Ability to describe self-care measures that may prevent or decrease complications (Diabetes Survival Skills Education) will improve Outcome: Progressing Goal: Individualized Educational Video(s) Outcome: Progressing

## 2024-07-12 NOTE — Plan of Care (Signed)
   Problem: Coping: Goal: Ability to adjust to condition or change in health will improve Outcome: Progressing   Problem: Fluid Volume: Goal: Ability to maintain a balanced intake and output will improve Outcome: Progressing

## 2024-07-12 NOTE — Care Management Important Message (Signed)
 Important Message  Patient Details  Name: Yvonne Todd MRN: [id number] Date of Birth: [birth year]   Important Message Given:  Yes - Medicare IM     Claretta Deed 07/12/2024, 2:20 PM

## 2024-07-13 DIAGNOSIS — G9340 Encephalopathy, unspecified: Secondary | ICD-10-CM | POA: Diagnosis not present

## 2024-07-13 DIAGNOSIS — J9611 Chronic respiratory failure with hypoxia: Secondary | ICD-10-CM | POA: Diagnosis not present

## 2024-07-13 DIAGNOSIS — E118 Type 2 diabetes mellitus with unspecified complications: Secondary | ICD-10-CM | POA: Diagnosis not present

## 2024-07-13 LAB — CBC
HCT: 32.3 % — ABNORMAL LOW (ref 36.0–46.0)
Hemoglobin: 10.8 g/dL — ABNORMAL LOW (ref 12.0–15.0)
MCH: 31.1 pg (ref 26.0–34.0)
MCHC: 33.4 g/dL (ref 30.0–36.0)
MCV: 93.1 fL (ref 80.0–100.0)
Platelets: 301 K/uL (ref 150–400)
RBC: 3.47 MIL/uL — ABNORMAL LOW (ref 3.87–5.11)
RDW: 12.2 % (ref 11.5–15.5)
WBC: 6.4 K/uL (ref 4.0–10.5)
nRBC: 0 % (ref 0.0–0.2)

## 2024-07-13 LAB — CULTURE, BLOOD (ROUTINE X 2)
Culture: NO GROWTH
Culture: NO GROWTH

## 2024-07-13 LAB — GLUCOSE, CAPILLARY
Glucose-Capillary: 115 mg/dL — ABNORMAL HIGH (ref 70–99)
Glucose-Capillary: 150 mg/dL — ABNORMAL HIGH (ref 70–99)
Glucose-Capillary: 209 mg/dL — ABNORMAL HIGH (ref 70–99)

## 2024-07-13 LAB — BASIC METABOLIC PANEL WITH GFR
Anion gap: 12 (ref 5–15)
BUN: 7 mg/dL — ABNORMAL LOW (ref 8–23)
CO2: 31 mmol/L (ref 22–32)
Calcium: 8.7 mg/dL — ABNORMAL LOW (ref 8.9–10.3)
Chloride: 102 mmol/L (ref 98–111)
Creatinine, Ser: 0.56 mg/dL (ref 0.44–1.00)
GFR, Estimated: >60 mL/min (ref >60)
Glucose, Bld: 101 mg/dL — ABNORMAL HIGH (ref 70–99)
Potassium: 3.4 mmol/L — ABNORMAL LOW (ref 3.5–5.1)
Sodium: 145 mmol/L (ref 135–145)

## 2024-07-13 LAB — MAGNESIUM: Magnesium: 2.1 mg/dL (ref 1.7–2.4)

## 2024-07-13 MED ORDER — NOVOLOG FLEXPEN 100 UNIT/ML ~~LOC~~ SOPN: PEN_INJECTOR | SUBCUTANEOUS | Status: DC

## 2024-07-13 MED ORDER — POTASSIUM CHLORIDE CRYS ER 20 MEQ PO TBCR
40.0000 meq | EXTENDED_RELEASE_TABLET | Freq: Once | ORAL | Status: AC
  Administered 2024-07-13: 40 meq via ORAL
  Filled 2024-07-13: qty 2

## 2024-07-13 NOTE — TOC Progression Note (Signed)
 Transition of Care [facility]) - Progression Note    Patient Details  Name: Yvonne Todd MRN: [id number] Date of Birth: 1951/02/25  Transition of Care [facility]) CM/SW Contact  Bernardino Dean, [place] Phone Number: 07/13/2024, 10:13 AM  Clinical Narrative:    [facility] is able to accept the patient today. [facility] has been approved. Spoke with patient's sister [name] regarding the discharge plan, she is aware of the plan and agreeable to it.    Expected Discharge Plan: Skilled Nursing Facility Barriers to Discharge: Continued Medical Work up               Expected Discharge Plan and Services In-house Referral: Clinical Social Work   Post Acute Care Choice: Skilled Nursing Facility Living arrangements for the past 2 months: Mobile Home Expected Discharge Date: 07/13/24                                     Social Drivers of Health (SDOH) Interventions SDOH Screenings   Food Insecurity: No Food Insecurity (07/09/2024)  Housing: Low Risk  (07/09/2024)  Transportation Needs: No Transportation Needs (07/09/2024)  Utilities: Not At Risk (07/09/2024)  Alcohol  Screen: Low Risk  (06/22/2024)  Depression (PHQ2-9): Low Risk  (06/22/2024)  Financial Resource Strain: Low Risk  (06/22/2024)  Physical Activity: Inactive (06/22/2024)  Social Connections: Moderately Isolated (07/09/2024)  Stress: No Stress Concern Present (06/22/2024)  Tobacco Use: Medium Risk (07/09/2024)  Health Literacy: Adequate Health Literacy (06/22/2024)    Readmission Risk Interventions     No data to display

## 2024-07-13 NOTE — Discharge Summary (Signed)
 PATIENT DETAILS Name: Yvonne Todd Age: 73 y.o. Sex: female Date of Birth: 1951-02-07 MRN: [id number]. Admitting Physician: Will Bernard, MD ERE:Rmjtqnmi, Almarie LABOR, MD  Admit Date: 07/08/2024 Discharge date: 07/13/2024  Recommendations for Outpatient Follow-up:  Follow up with PCP in 1-2 weeks Please obtain CMP/CBC in one week  Admitted From:  Home  Disposition: Skilled nursing facility   Discharge Condition: good  CODE STATUS:   Code Status: Full Code   Diet recommendation:  Diet Order             Diet - low sodium heart healthy           Diet Carb Modified           Diet heart healthy/carb modified Fluid consistency: Thin  Diet effective now                    Brief Summary: Patient is a 73 y.o.  female with history of COPD on 3 L of oxygen , DM-2, asthma-who was found by her sister with altered at home-she was subsequently brought to the ED and admitted to the hospitalist service.  See below for further details   Significant events: 11/7>> admit to [facility]   Significant studies: 11/7>> CT head: No acute intracranial abnormality 11/7>> CT abdomen/pelvis: No acute findings.   Significant microbiology data: 11/7>> blood culture: No growth 11/8>> CSF culture: Negative 11/8>> CSF meningitis panel: Negative 11/9>> CSF WBC-18, RBC 22275, protein 72   Procedures: 11/8>> LP under fluoroscopy (aborted due to agitation-only 0.5 cc of CSF obtained) 11/9>> repeat LP under fluoroscopy-bloody tap.   Consults: None  Brief Hospital Course: Acute metabolic encephalopathy Unclear etiology-but suspect this is mostly metabolic from hyperglycemia/mild DKA/AKI-but due to concern for encephalitis-was placed on broad-spectrum antimicrobial therapy.  S/p LP x 2-CSF studies as above-low suspicion for infection-off all antimicrobials-Acyclovir discontinued on 11/10.  Stable x 48 hours off all antimicrobial therapy-mentation continues to improve-completely awake and  alert this morning.  Stable to go to SNF today.   DKA Was very mild-resolved with IV fluids and insulin -did not have to start insulin  infusion   AKI Probably related to osmotic diuresis secondary to DKA/probable some hemodynamic mediated injury as well Improving with supportive care   Hypokalemia Repleted   Hypernatremia Resolved supportive care.   DM-2 (A1c 6.7 on 11/8) CBGs better controlled Continue Lantus  25 units daily+ SSI Continue SSI/follow/optimize.  COPD with chronic hypoxic respiratory failure-on 2-3L of oxygen  Stable-not in exacerbation Continue to-3 L of oxygen  Continue bronchodilators   HTN BP stable Continue to hold valsartan  for now Resume when able.   PAD Unclear if she is taking aspirin . Continue statin.   Scattered pulmonary nodules-largest 6 mm left lower lobe Incidental finding on CT imaging Radiology recommending noncontrast CT in 3 to 6 months.   Class 2 Obesity: Estimated body mass index is 36.21 kg/m as calculated from the following:   Height as of this encounter: 5' 2 (1.575 m).   Weight as of this encounter: 89.8 kg.   Discharge Diagnoses:  Principal Problem:   Encephalopathy acute Active Problems:   Type 2 diabetes with complication (HCC)   Chronic respiratory failure with hypoxia [facility])   Discharge Instructions:  Activity:  As tolerated with Full fall precautions use walker/cane & assistance as needed  Discharge Instructions     Call MD for:  difficulty breathing, headache or visual disturbances   Complete by: As directed    Call MD for:  persistant nausea and  vomiting   Complete by: As directed    Diet - low sodium heart healthy   Complete by: As directed    Diet Carb Modified   Complete by: As directed    Discharge instructions   Complete by: As directed    Follow with Primary MD  Rollene Almarie LABOR, MD in 1-2 weeks  Please get a complete blood count and chemistry panel checked by your Primary MD at your next  visit, and again as instructed by your Primary MD.  Get Medicines reviewed and adjusted: Please take all your medications with you for your next visit with your Primary MD  Laboratory/radiological data: Please request your Primary MD to go over all hospital tests and procedure/radiological results at the follow up, please ask your Primary MD to get all Hospital records sent to his/her office.  In some cases, they will be blood work, cultures and biopsy results pending at the time of your discharge. Please request that your primary care M.D. follows up on these results.  Also Note the following: If you experience worsening of your admission symptoms, develop shortness of breath, life threatening emergency, suicidal or homicidal thoughts you must seek medical attention immediately by calling 911 or calling your MD immediately  if symptoms less severe.  You must read complete instructions/literature along with all the possible adverse reactions/side effects for all the Medicines you take and that have been prescribed to you. Take any new Medicines after you have completely understood and accpet all the possible adverse reactions/side effects.   Do not drive when taking Pain medications or sleeping medications (Benzodaizepines)  Do not take more than prescribed Pain, Sleep and Anxiety Medications. It is not advisable to combine anxiety,sleep and pain medications without talking with your primary care practitioner  Special Instructions: If you have smoked or chewed Tobacco  in the last 2 yrs please stop smoking, stop any regular Alcohol   and or any Recreational drug use.  Wear Seat belts while driving.  Please note: You were cared for by a hospitalist during your hospital stay. Once you are discharged, your primary care physician will handle any further medical issues. Please note that NO REFILLS for any discharge medications will be authorized once you are discharged, as it is imperative that you  return to your primary care physician (or establish a relationship with a primary care physician if you do not have one) for your post hospital discharge needs so that they can reassess your need for medications and monitor your lab values.   CBGs before meals and at bedtime   Increase activity slowly   Complete by: As directed       Allergies as of 07/13/2024       Reactions   Fish Allergy Rash, Other (See Comments)   Some types of fish with iodine cause rashes   Iodine Rash, Other (See Comments)   Some types of fish with iodine cause rashes   Penicillins Rash   She has tolerated 3 doses of ampicillin 2gm IV without difficulty on 07/10/24   Shellfish Protein-containing Drug Products Rash, Other (See Comments)   Some types of fish with iodine cause rashes   Tetracyclines & Related Rash        Medication List     STOP taking these medications    diphenhydrAMINE  25 MG tablet Commonly known as: BENADRYL        TAKE these medications    albuterol  (2.5 MG/3ML) 0.083% nebulizer solution Commonly known as: PROVENTIL  Inhale  3 mLs (2.5 mg total) by nebulization 3 (three) times daily. What changed:  when to take this reasons to take this   albuterol  108 (90 Base) MCG/ACT inhaler Commonly known as: VENTOLIN  HFA Inhale 1-2 puffs into the lungs every 4 (four) hours as needed for wheezing or shortness of breath. What changed: Another medication with the same name was changed. Make sure you understand how and when to take each.   aspirin  EC 81 MG tablet Take 1 tablet (81 mg total) by mouth daily.   atorvastatin  80 MG tablet Commonly known as: LIPITOR Take 1 tablet (80 mg total) by mouth at bedtime.   Centrum Ultra Womens Tabs Take 1 tablet by mouth daily with breakfast.   co-enzyme Q-10 30 MG capsule Take 30 mg by mouth daily.   Dexcom G7 Receiver Devi To use with G7 sensors   Dexcom G7 Sensor Misc Apply sensor every 10 days   diphenhydramine -acetaminophen  25-500 MG  Tabs tablet Commonly known as: TYLENOL  PM Take 2 tablets by mouth at bedtime as needed (for pain or sleep).   famotidine 40 MG tablet Commonly known as: PEPCID Take 1 tablet (40 mg total) by mouth daily.   fluticasone  50 MCG/ACT nasal spray Commonly known as: FLONASE  Place 2 sprays into both nostrils daily. What changed:  when to take this reasons to take this   furosemide  20 MG tablet Commonly known as: LASIX  Take 1-2 tablets (20-40 mg total) by mouth daily. What changed:  how much to take when to take this additional instructions   gabapentin  300 MG capsule Commonly known as: NEURONTIN  Take 1 capsule (300 mg total) by mouth 3 (three) times daily.   glipiZIDE  5 MG tablet Commonly known as: GLUCOTROL  Take 0.5 tablets (2.5 mg total) by mouth 2 (two) times daily before a meal.   Insupen Pen Needles 31G X 5 MM Misc Generic drug: Insulin  Pen Needle Use daily as directed to inject lantus    Jardiance  25 MG Tabs tablet Generic drug: empagliflozin  Take 1 tablet (25 mg total) by mouth daily.   Lantus  SoloStar 100 UNIT/ML Solostar Pen Generic drug: insulin  glargine Inject 25 Units into the skin daily.   meloxicam  15 MG tablet Commonly known as: MOBIC  Take 1 tablet (15 mg total) by mouth daily.   metFORMIN  500 MG 24 hr tablet Commonly known as: GLUCOPHAGE -XR Take 1 tablet (500 mg total) by mouth 2 (two) times daily with a meal.   montelukast  10 MG tablet Commonly known as: SINGULAIR  Take 1 tablet (10 mg total) by mouth at bedtime.   Mounjaro  7.5 MG/0.5ML Pen Generic drug: tirzepatide  Inject 7.5 mg into the skin once a week. What changed: when to take this   Nebulizer/Tubing/Mouthpiece Kit Use with nebulizer   NovoLOG  FlexPen 100 UNIT/ML FlexPen Generic drug: insulin  aspart 0-9 Units, Subcutaneous, 3 times daily with meals CBG < 70: Implement Hypoglycemia measures CBG 70 - 120: 0 units CBG 121 - 150: 1 unit CBG 151 - 200: 2 units CBG 201 - 250: 3 units CBG 251 -  300: 5 units CBG 301 - 350: 7 units CBG 351 - 400: 9 units CBG > 400: call MD   OXYGEN  Inhale 3-5 L/min into the lungs continuous.   Trelegy Ellipta  100-62.5-25 MCG/ACT Aepb Generic drug: Fluticasone -Umeclidin-Vilant Inhale 1 puff into the lungs daily.   valsartan  80 MG tablet Commonly known as: DIOVAN  Take 1 tablet (80 mg total) by mouth daily.        Follow-up Information  Rollene Almarie LABOR, MD. Schedule an appointment as soon as possible for a visit in 1 week(s).   Specialty: Internal Medicine Contact information: [address] [place] [address] [phone]                Allergies  Allergen Reactions   Fish Allergy Rash and Other (See Comments)    Some types of fish with iodine cause rashes   Iodine Rash and Other (See Comments)    Some types of fish with iodine cause rashes   Penicillins Rash    She has tolerated 3 doses of ampicillin 2gm IV without difficulty on 07/10/24   Shellfish Protein-Containing Drug Products Rash and Other (See Comments)    Some types of fish with iodine cause rashes   Tetracyclines & Related Rash     Other Procedures/Studies: CT HEAD WO CONTRAST ( ) Result Date: 07/12/2024 EXAM: CT HEAD WITHOUT CONTRAST 07/12/2024 03:55:43 AM TECHNIQUE: CT of the head was performed without the administration of intravenous contrast. Automated exposure control, iterative reconstruction, and/or weight based adjustment of the mA/kV was utilized to reduce the radiation dose to as low as reasonably achievable. COMPARISON: MRI Brain 07/11/2024 and Head CT 07/08/2024. CLINICAL HISTORY: 73 year old female with mental status change. MRI Brain 07/11/2024 showed trace intraventricular hemorrhage or debris, follow-up CT. FINDINGS: BRAIN AND VENTRICLES: No acute hemorrhage. No evidence of acute infarct. No hydrocephalus. No extra-axial collection. No mass effect or midline shift. No ventriculomegaly. Stable CT appearance of the ventricles, no  discrete blood or abnormal density identified layering in the atria and occipital horns by CT, no transependymal edema. Stable gray-white differentiation with patchy bilateral white matter hypodensity and basal ganglia heterogeneity compatible with chronic small vessel disease and concordant with MRI appearance. No suspicious intracranial vascular hyperdensity. Mild calcified atherosclerosis at the skull base. ORBITS: No acute abnormality. SINUSES: Mild right mastoid effusion/opacification is stable recently. Right tympanic cavity remains clear. Left middle ear, left mastoids, and paranasal sinuses remain well aerated. SOFT TISSUES AND SKULL: No acute soft tissue abnormality. No skull fracture. IMPRESSION: 1. No IVH or acute intracranial abnormality by CT; stable non-contrast CT appearance of the brain since 07/08/2024 with chronic small vessel disease. Electronically signed by: Helayne Hurst MD 07/12/2024 04:22 AM EST RP Workstation: [id number]   EEG adult Result Date: 07/11/2024 Shelton Arlin KIDD, MD     07/11/2024  5:19 PM Patient Name: Yvonne Todd MRN: [id number] Epilepsy Attending: Arlin KIDD Shelton Referring Physician/Provider: Raenelle Donalda HERO, MD Date: 07/11/2024 Duration: 21.22 mins Patient history: 73 year old female with altered mental status.  EEG to alert for seizure. Level of alertness: Awake/ lethargic AEDs during EEG study: None Technical aspects: This EEG study was done with scalp electrodes positioned according to the 10-20 International system of electrode placement. Electrical activity was reviewed with band pass filter of 1-70Hz , sensitivity of 7 uV/mm, display speed of 39mm/sec with a 60Hz  notched filter applied as appropriate. EEG data were recorded continuously and digitally stored.  Video monitoring was available and reviewed as appropriate. Description: EEG showed continuous generalized predominantly 5 to 8 Hz theta-alpha activity admixed with intermittent 2 to 3 Hz delta slowing.  Hyperventilation and photic stimulation were not performed.  Of note, study was technically difficult due to significant movement and electrode artifact.  ABNORMALITY - Continuous slow, generalized  IMPRESSION: This technically difficult study is suggestive of generalized cerebral dysfunction (encephalopathy). No seizures or epileptiform discharges were seen throughout the recording.  Priyanka O Yadav   MR BRAIN W WO  CONTRAST Result Date: 07/11/2024 EXAM: MRI BRAIN WITH AND WITHOUT CONTRAST 07/11/2024 09:31:14 AM TECHNIQUE: Multiplanar multisequence MRI of the head/brain was performed with and without the administration of intravenous contrast. CONTRAST: 9 mL of Gadavist. COMPARISON: CT head 07/08/2024. CLINICAL HISTORY: Mental status change, unknown cause. FINDINGS: Exam quality is significantly degraded by motion artifact. BRAIN AND VENTRICLES: There are small foci of hypointensity on susceptibility-weighted images within the atria and occipital horns of the bilateral lateral ventricles with suggestion of trace fluid level within the right occipital horn (series 7, image 11). Ventricular caliber is otherwise within normal limits for the patient's stated age . No acute infarction or definite abnormal intracranial enhancement. No mass effect or midline shift. The sella is unremarkable. Normal flow voids. There is T2/FLAIR hyperintensity within the periventricular and subcortical cerebral white matter, as well as within the pons, likely on the basis of chronic microvascular ischemia. Chronic lacunar infarction anterior right thalamus. ORBITS: Bilateral lens replacement. SINUSES: No acute abnormality. BONES AND SOFT TISSUES: Normal bone marrow signal and enhancement. No acute soft tissue abnormality. Mild right mastoid effusion. IMPRESSION: 1. Significantly motion-degraded exam. 2. Small/ trace layering dependent foci within the lateral ventricles may represent trace intraventricular hemorrhage, possibly related  to recent procedures. Differential diagnosis includes infection (meningitis/ ventriculitis) though is considered less likely in the absence of restricted diffusion or ependymal enhancement. 3. Within the above limitation, no acute infarction, mass effect, or abnormal intracranial enhancement. Electronically signed by: prentice spade 07/11/2024 12:24 PM EST RP Workstation: [id number]   DG FL GUIDED LUMBAR PUNCTURE Result Date: 07/10/2024 CLINICAL DATA:  73 year old female currently admitted for acute encephalopathy. Patient underwent lumbar puncture on 07/09/2024; however, was quickly aborted as patient was very agitated during the exam. Request for repeat lumbar puncture. EXAM: LUMBAR PUNCTURE UNDER FLUOROSCOPY PROCEDURE: An appropriate skin entry site was determined fluoroscopically. Operator donned sterile gloves and mask. Skin site was marked, then prepped with Betadine, draped in usual sterile fashion, and infiltrated locally with 1% lidocaine . A 20 gauge spinal needle advanced into the thecal sac at L3-4 from a right interlaminar approach. Blood-tinged CSF spontaneously returned, with opening pressure of 12 cm water. 6 ml CSF were collected and divided among 3 sterile vials for the requested laboratory studies. The needle was then removed. The patient tolerated the procedure well and there were no complications. FLUOROSCOPY: Radiation Exposure Index (as provided by the fluoroscopic device): 36.9 mGy Kerma IMPRESSION: Technically successful lumbar puncture under fluoroscopy. This exam was performed by [facility] PA-C, and was supervised and interpreted by Dr. VEAR Lent. Electronically Signed   By: Wilkie Lent M.D.   On: 07/10/2024 15:59   DG FL GUIDED LUMBAR PUNCTURE Result Date: 07/09/2024 CLINICAL DATA:  73 year old female admitted with altered mental status. IR was requested for lumbar puncture. EXAM: LUMBAR PUNCTURE UNDER FLUOROSCOPY PROCEDURE: Patient received 1 mg Ativan IV prior to  procedure, as ordered by patient's admitting physician. An appropriate skin entry site was determined fluoroscopically. Operator donned sterile gloves and mask. Skin site was marked, then prepped with Betadine, draped in usual sterile fashion, and infiltrated locally with 1% lidocaine . A 20 gauge spinal needle advanced into the thecal sac at L4-L5 from a left interlaminar approach. Minimally blood-tinged CSF spontaneously returned, with opening pressure of 12 cm water. 0.5 mL ml CSF were collected. However, the procedure was soon aborted, as patient remained agitated despite Ativan given, which precluded safe completion of lumbar puncture. The patient tolerated the procedure poorly, though there were no discerned complications.  FLUOROSCOPY: Radiation Exposure Index (as provided by the fluoroscopic device): 17.00 mGy Kerma IMPRESSION: Technically successful lumbar puncture under fluoroscopy. This exam was performed by Carlin Griffon, PA-C, and was supervised and interpreted by Dr. Wilkie Lent, MD. Electronically Signed   By: Wilkie Lent M.D.   On: 07/09/2024 15:00   CT ABDOMEN PELVIS WO CONTRAST Result Date: 07/08/2024 EXAM: CT ABDOMEN AND PELVIS WITHOUT CONTRAST 07/08/2024 08:12:58 PM TECHNIQUE: CT of the abdomen and pelvis was performed without the administration of intravenous contrast. Multiplanar reformatted images are provided for review. Automated exposure control, iterative reconstruction, and/or weight-based adjustment of the mA/kV was utilized to reduce the radiation dose to as low as reasonably achievable. COMPARISON: None available. CLINICAL HISTORY: Abdominal pain. FINDINGS: LOWER CHEST: Prominent bases are well aerated with emphysematous changes. Scattered small pulmonary nodules are noted particularly in the left lower lobe. The largest of these measures 6 mm best seen on image number 6 of series 5. LIVER: The liver is within normal limits. GALLBLADDER AND BILE DUCTS: Cholelithiasis is  noted without complicating factors. No biliary ductal dilatation. SPLEEN: The spleen is unremarkable. PANCREAS: The pancreas is unremarkable. ADRENAL GLANDS: The adrenal glands are within normal limits. KIDNEYS, URETERS AND BLADDER: The kidneys are well visualized bilaterally. No renal calculi or obstructive changes are seen. The bladder is well distended. GI AND BOWEL: Stomach demonstrates no acute abnormality. Scattered diverticular changes of the colon are noted without diverticulitis. The appendix is within normal limits. The small bowel is unremarkable. PERITONEUM AND RETROPERITONEUM: No free fluid is noted. No free air. VASCULATURE: Aortic calcifications are seen without aneurysmal dilatation. LYMPH NODES: No lymphadenopathy. REPRODUCTIVE ORGANS: Uterus is within normal limits. No adnexal mass is seen. BONES AND SOFT TISSUES: Degenerative changes of the lumbar spine are seen. IMPRESSION: 1. No acute findings in the abdomen or pelvis. 2. Scattered small pulmonary nodules, largest measuring 6 mm in the left lower lobe. Recommend non-contrast chest CT at 36 months, then consider non-contrast chest CT at 1824 months for multiple nodules with most suspicious 6 mm per Fleischner Society Guidelines. 3. Pulmonary emphysema. Recommend consideration for evaluation for a low-dose CT lung cancer screening program for eligible patients aged 19. 4. Cholelithiasis without complicating factors. 5. Scattered diverticular changes of the colon without diverticulitis. Electronically signed by: Oneil Devonshire MD 07/08/2024 08:27 PM EST RP Workstation: [id number]   CT Head Wo Contrast Result Date: 07/08/2024 EXAM: CT HEAD WITHOUT CONTRAST 07/08/2024 05:35:50 PM TECHNIQUE: CT of the head was performed without the administration of intravenous contrast. Automated exposure control, iterative reconstruction, and/or weight based adjustment of the mA/kV was utilized to reduce the radiation dose to as low as reasonably achievable.  COMPARISON: None available. CLINICAL HISTORY: Mental status change, unknown cause. FINDINGS: BRAIN AND VENTRICLES: No acute hemorrhage. No evidence of acute infarct. No hydrocephalus. No extra-axial collection. No mass effect or midline shift. Mild chronic microangiopathic changes. ORBITS: No acute abnormality. SINUSES: No acute abnormality. SOFT TISSUES AND SKULL: No acute soft tissue abnormality. No skull fracture. Small right mastoid air cell effusion. IMPRESSION: 1. No acute intracranial abnormality. Electronically signed by: Norman Gatlin MD 07/08/2024 06:08 PM EST RP Workstation: [id number]   DG Chest Portable 1 View Result Date: 07/08/2024 EXAM: 1 VIEW(S) XRAY OF THE CHEST 07/08/2024 03:20:00 PM COMPARISON: Chest radiograph 01/13/2024. CLINICAL HISTORY: SOB, sepsis. FINDINGS: LUNGS AND PLEURA: No focal pulmonary opacity. No pulmonary edema. No pleural effusion. No pneumothorax. HEART AND MEDIASTINUM: Aortic atherosclerosis. Stable cardiomediastinal silhouette. BONES AND SOFT TISSUES: Degenerative changes of the  bilateral glenohumeral joints. No acute osseous abnormality. IMPRESSION: 1. No acute cardiopulmonary findings. 2. Aortic atherosclerosis. Electronically signed by: Harrietta Sherry MD 07/08/2024 04:25 PM EST RP Workstation: [id number]     TODAY-DAY OF DISCHARGE:  Subjective:   Yvonne Todd today has no headache,no chest abdominal pain,no new weakness tingling or numbness, feels much better wants to go home today.   Objective:   Blood pressure (!) 166/62, pulse 68, temperature 98 F (36.7 C), temperature source Oral, resp. rate 14, height 5' 2 (1.575 m), weight 89.8 kg, SpO2 94%.  Intake/Output Summary (Last 24 hours) at 07/13/2024 0902 Last data filed at 07/13/2024 0530 Gross per 24 hour  Intake --  Output 900 ml  Net -900 ml   Filed Weights   07/09/24 0634  Weight: 89.8 kg    Exam: Awake Alert, Oriented *3, No new F.N deficits, Normal affect [place].AT,PERRAL Supple Neck,No  JVD, No cervical lymphadenopathy appriciated.  Symmetrical Chest wall movement, Good air movement bilaterally, CTAB RRR,No Gallops,Rubs or new Murmurs, No Parasternal Heave +ve B.Sounds, Abd Soft, Non tender, No organomegaly appriciated, No rebound -guarding or rigidity. No Cyanosis, Clubbing or edema, No new Rash or bruise   PERTINENT RADIOLOGIC STUDIES: CT HEAD WO CONTRAST ( ) Result Date: 07/12/2024 EXAM: CT HEAD WITHOUT CONTRAST 07/12/2024 03:55:43 AM TECHNIQUE: CT of the head was performed without the administration of intravenous contrast. Automated exposure control, iterative reconstruction, and/or weight based adjustment of the mA/kV was utilized to reduce the radiation dose to as low as reasonably achievable. COMPARISON: MRI Brain 07/11/2024 and Head CT 07/08/2024. CLINICAL HISTORY: 73 year old female with mental status change. MRI Brain 07/11/2024 showed trace intraventricular hemorrhage or debris, follow-up CT. FINDINGS: BRAIN AND VENTRICLES: No acute hemorrhage. No evidence of acute infarct. No hydrocephalus. No extra-axial collection. No mass effect or midline shift. No ventriculomegaly. Stable CT appearance of the ventricles, no discrete blood or abnormal density identified layering in the atria and occipital horns by CT, no transependymal edema. Stable gray-white differentiation with patchy bilateral white matter hypodensity and basal ganglia heterogeneity compatible with chronic small vessel disease and concordant with MRI appearance. No suspicious intracranial vascular hyperdensity. Mild calcified atherosclerosis at the skull base. ORBITS: No acute abnormality. SINUSES: Mild right mastoid effusion/opacification is stable recently. Right tympanic cavity remains clear. Left middle ear, left mastoids, and paranasal sinuses remain well aerated. SOFT TISSUES AND SKULL: No acute soft tissue abnormality. No skull fracture. IMPRESSION: 1. No IVH or acute intracranial abnormality by CT; stable  non-contrast CT appearance of the brain since 07/08/2024 with chronic small vessel disease. Electronically signed by: Helayne Hurst MD 07/12/2024 04:22 AM EST RP Workstation: [id number]   EEG adult Result Date: 07/11/2024 [place] Arlin KIDD, MD     07/11/2024  5:19 PM Patient Name: Yvonne Todd MRN: [id number] Epilepsy Attending: Arlin KIDD Shelton Referring Physician/Provider: Raenelle Donalda HERO, MD Date: 07/11/2024 Duration: 21.22 mins Patient history: 73 year old female with altered mental status.  EEG to alert for seizure. Level of alertness: Awake/ lethargic AEDs during EEG study: None Technical aspects: This EEG study was done with scalp electrodes positioned according to the 10-20 International system of electrode placement. Electrical activity was reviewed with band pass filter of 1-70Hz , sensitivity of 7 uV/mm, display speed of 91mm/sec with a 60Hz  notched filter applied as appropriate. EEG data were recorded continuously and digitally stored.  Video monitoring was available and reviewed as appropriate. Description: EEG showed continuous generalized predominantly 5 to 8 Hz theta-alpha activity admixed with intermittent 2 to 3 Hz delta  slowing. Hyperventilation and photic stimulation were not performed.  Of note, study was technically difficult due to significant movement and electrode artifact.  ABNORMALITY - Continuous slow, generalized  IMPRESSION: This technically difficult study is suggestive of generalized cerebral dysfunction (encephalopathy). No seizures or epileptiform discharges were seen throughout the recording.  Arlin MALVA Krebs   MR BRAIN W WO CONTRAST Result Date: 07/11/2024 EXAM: MRI BRAIN WITH AND WITHOUT CONTRAST 07/11/2024 09:31:14 AM TECHNIQUE: Multiplanar multisequence MRI of the head/brain was performed with and without the administration of intravenous contrast. CONTRAST: 9 mL of Gadavist. COMPARISON: CT head 07/08/2024. CLINICAL HISTORY: Mental status change, unknown cause.  FINDINGS: Exam quality is significantly degraded by motion artifact. BRAIN AND VENTRICLES: There are small foci of hypointensity on susceptibility-weighted images within the atria and occipital horns of the bilateral lateral ventricles with suggestion of trace fluid level within the right occipital horn (series 7, image 11). Ventricular caliber is otherwise within normal limits for the patient's stated age . No acute infarction or definite abnormal intracranial enhancement. No mass effect or midline shift. The sella is unremarkable. Normal flow voids. There is T2/FLAIR hyperintensity within the periventricular and subcortical cerebral white matter, as well as within the pons, likely on the basis of chronic microvascular ischemia. Chronic lacunar infarction anterior right thalamus. ORBITS: Bilateral lens replacement. SINUSES: No acute abnormality. BONES AND SOFT TISSUES: Normal bone marrow signal and enhancement. No acute soft tissue abnormality. Mild right mastoid effusion. IMPRESSION: 1. Significantly motion-degraded exam. 2. Small/ trace layering dependent foci within the lateral ventricles may represent trace intraventricular hemorrhage, possibly related to recent procedures. Differential diagnosis includes infection (meningitis/ ventriculitis) though is considered less likely in the absence of restricted diffusion or ependymal enhancement. 3. Within the above limitation, no acute infarction, mass effect, or abnormal intracranial enhancement. Electronically signed by: prentice bybordi 07/11/2024 12:24 PM EST RP Workstation: [id number]     PERTINENT LAB RESULTS: CBC: Recent Labs    07/11/24 0418 07/13/24 0333  WBC 8.1 6.4  HGB 10.7* 10.8*  HCT 32.1* 32.3*  PLT 290 301   CMET CMP     Component Value Date/Time   NA 145 07/13/2024 0333   K 3.4 (L) 07/13/2024 0333   CL 102 07/13/2024 0333   CO2 31 07/13/2024 0333   GLUCOSE 101 (H) 07/13/2024 0333   BUN 7 (L) 07/13/2024 0333   CREATININE 0.56  07/13/2024 0333   CALCIUM  8.7 (L) 07/13/2024 0333   PROT 5.4 (L) 07/11/2024 0418   ALBUMIN  2.3 (L) 07/11/2024 0418   AST 17 07/11/2024 0418   ALT 20 07/11/2024 0418   ALKPHOS 60 07/11/2024 0418   BILITOT 0.3 07/11/2024 0418   GFR 49.36 (L) 06/30/2023 1642   GFRNONAA >60 07/13/2024 0333    GFR Estimated Creatinine Clearance: 64.3 mL/min (by C-G formula based on SCr of 0.56 mg/dL). No results for input(s): LIPASE, AMYLASE in the last 72 hours. No results for input(s): CKTOTAL, CKMB, CKMBINDEX, TROPONINI in the last 72 hours. Invalid input(s): POCBNP No results for input(s): DDIMER in the last 72 hours. No results for input(s): HGBA1C in the last 72 hours. No results for input(s): CHOL, HDL, LDLCALC, TRIG, CHOLHDL, LDLDIRECT in the last 72 hours. No results for input(s): TSH, T4TOTAL, T3FREE, THYROIDAB in the last 72 hours.  Invalid input(s): FREET3 No results for input(s): VITAMINB12, FOLATE, FERRITIN, TIBC, IRON, RETICCTPCT in the last 72 hours. Coags: No results for input(s): INR in the last 72 hours.  Invalid input(s): PT Microbiology: Recent Results (from the  past 240 hours)  Blood culture (routine x 2)     Status: None   Collection Time: 07/08/24  3:48 PM   Specimen: BLOOD RIGHT FOREARM  Result Value Ref Range Status   Specimen Description BLOOD RIGHT FOREARM  Final   Special Requests   Final    BOTTLES DRAWN AEROBIC AND ANAEROBIC Blood Culture results may not be optimal due to an inadequate volume of blood received in culture bottles   Culture   Final    NO GROWTH 5 DAYS Performed at [facility], [address] [place], [place] [address]    Report Status 07/13/2024 FINAL  Final  Blood culture (routine x 2)     Status: None   Collection Time: 07/08/24  5:50 PM   Specimen: BLOOD RIGHT HAND  Result Value Ref Range Status   Specimen Description BLOOD RIGHT HAND  Final   Special Requests   Final    BOTTLES  DRAWN AEROBIC AND ANAEROBIC Blood Culture results may not be optimal due to an inadequate volume of blood received in culture bottles   Culture   Final    NO GROWTH 5 DAYS Performed at [facility], [address] [place], [place] [address]    Report Status 07/13/2024 FINAL  Final  CSF culture w Gram Stain     Status: None   Collection Time: 07/09/24  1:42 PM   Specimen: PATH Cytology CSF; Cerebrospinal Fluid  Result Value Ref Range Status   Specimen Description CSF  Final   Special Requests NONE  Final   Gram Stain   Final    FEW WBC PRESENT, PREDOMINANTLY MONONUCLEAR NO ORGANISMS SEEN    Culture   Final    NO GROWTH 3 DAYS Performed at [facility], [address] [place], [place] [address]    Report Status 07/12/2024 FINAL  Final    FURTHER DISCHARGE INSTRUCTIONS:  Get Medicines reviewed and adjusted: Please take all your medications with you for your next visit with your Primary MD  Laboratory/radiological data: Please request your Primary MD to go over all hospital tests and procedure/radiological results at the follow up, please ask your Primary MD to get all Hospital records sent to his/her office.  In some cases, they will be blood work, cultures and biopsy results pending at the time of your discharge. Please request that your primary care M.D. goes through all the records of your hospital data and follows up on these results.  Also Note the following: If you experience worsening of your admission symptoms, develop shortness of breath, life threatening emergency, suicidal or homicidal thoughts you must seek medical attention immediately by calling 911 or calling your MD immediately  if symptoms less severe.  You must read complete instructions/literature along with all the possible adverse reactions/side effects for all the Medicines you take and that have been prescribed to you. Take any new Medicines after you have completely understood and accpet all the  possible adverse reactions/side effects.   Do not drive when taking Pain medications or sleeping medications (Benzodaizepines)  Do not take more than prescribed Pain, Sleep and Anxiety Medications. It is not advisable to combine anxiety,sleep and pain medications without talking with your primary care practitioner  Special Instructions: If you have smoked or chewed Tobacco  in the last 2 yrs please stop smoking, stop any regular Alcohol   and or any Recreational drug use.  Wear Seat belts while driving.  Please note: You were cared  for by a hospitalist during your hospital stay. Once you are discharged, your primary care physician will handle any further medical issues. Please note that NO REFILLS for any discharge medications will be authorized once you are discharged, as it is imperative that you return to your primary care physician (or establish a relationship with a primary care physician if you do not have one) for your post hospital discharge needs so that they can reassess your need for medications and monitor your lab values.  Total Time spent coordinating discharge including counseling, education and face to face time equals greater than 30 minutes.  Signed: Navpreet Szczygiel 07/13/2024 9:02 AM

## 2024-07-13 NOTE — Progress Notes (Signed)
 Called report to [place] RN with [facility]

## 2024-07-13 NOTE — TOC Transition Note (Signed)
 Transition of Care [facility]) - Discharge Note   Patient Details  Name: Yvonne Todd MRN: [id number] Date of Birth: 1951/03/12  Transition of [facility]) CM/SW Contact:  Inocente GORMAN Kindle, LCSW Phone Number: 07/13/2024, 10:18 AM   Clinical Narrative:    Patient will DC to: [facility] Anticipated DC date: 07/13/24 Family notified: Sister, [name] Transport by: [place]   Per MD patient ready for DC to [facility]. RN to call report prior to discharge [phone] room 505). RN, patient, patient's family, and facility notified of DC. Discharge Summary and FL2 sent to facility. DC packet on chart. Ambulance transport requested for patient.   CSW will sign off for now as social work intervention is no longer needed. Please consult us  again if new needs arise.     Final next level of care: Skilled Nursing Facility Barriers to Discharge: Barriers Resolved   Patient Goals and CMS Choice Patient states their goals for this hospitalization and ongoing recovery are:: Spoke with sister, goal is to regain strength at post acute rehab CMS Medicare.gov Compare Post Acute Care list provided to:: Patient Represenative (must comment) Choice offered to / list presented to : Sibling [place] ownership interest in [facility].provided to:: Sibling    Discharge Placement   Existing PASRR number confirmed : 07/13/24          Patient chooses bed at: [facility] Patient to be transferred to facility by: [facility] Name of family member notified: Sister Patient and family notified of of transfer: 07/13/24  Discharge Plan and Services Additional resources added to the After Visit Summary for   In-house Referral: Clinical Social Work   Post Acute Care Choice: Skilled Nursing Facility                               Social Drivers of Health (SDOH) Interventions SDOH Screenings   Food Insecurity: No Food Insecurity (07/09/2024)  Housing: Low Risk  (07/09/2024)  Transportation Needs: No  Transportation Needs (07/09/2024)  Utilities: Not At Risk (07/09/2024)  Alcohol  Screen: Low Risk  (06/22/2024)  Depression (PHQ2-9): Low Risk  (06/22/2024)  Financial Resource Strain: Low Risk  (06/22/2024)  Physical Activity: Inactive (06/22/2024)  Social Connections: Moderately Isolated (07/09/2024)  Stress: No Stress Concern Present (06/22/2024)  Tobacco Use: Medium Risk (07/09/2024)  Health Literacy: Adequate Health Literacy (06/22/2024)     Readmission Risk Interventions     No data to display

## 2024-07-18 ENCOUNTER — Ambulatory Visit: Payer: Self-pay | Admitting: Internal Medicine

## 2024-07-19 ENCOUNTER — Other Ambulatory Visit (HOSPITAL_COMMUNITY): Payer: Self-pay

## 2024-07-19 ENCOUNTER — Other Ambulatory Visit: Payer: Self-pay

## 2024-07-25 ENCOUNTER — Other Ambulatory Visit

## 2024-08-01 LAB — MISC LABCORP TEST (SEND OUT): Labcorp test code: 139835

## 2024-08-04 ENCOUNTER — Other Ambulatory Visit: Payer: Self-pay

## 2024-08-04 ENCOUNTER — Other Ambulatory Visit (HOSPITAL_COMMUNITY): Payer: Self-pay

## 2024-08-04 ENCOUNTER — Other Ambulatory Visit (HOSPITAL_BASED_OUTPATIENT_CLINIC_OR_DEPARTMENT_OTHER): Payer: Self-pay

## 2024-08-04 MED ORDER — FUROSEMIDE 20 MG PO TABS
20.0000 mg | ORAL_TABLET | Freq: Every day | ORAL | 0 refills | Status: DC
  Filled 2024-08-04: qty 30, 30d supply, fill #0

## 2024-08-04 MED ORDER — TRELEGY ELLIPTA 100-62.5-25 MCG/ACT IN AEPB
1.0000 | INHALATION_SPRAY | Freq: Every day | RESPIRATORY_TRACT | 0 refills | Status: DC
  Filled 2024-08-04: qty 60, 30d supply, fill #0

## 2024-08-04 MED ORDER — ATORVASTATIN CALCIUM 80 MG PO TABS
80.0000 mg | ORAL_TABLET | Freq: Every day | ORAL | 0 refills | Status: DC
  Filled 2024-08-04: qty 30, 30d supply, fill #0

## 2024-08-04 MED ORDER — ASPIRIN 81 MG PO TBEC
81.0000 mg | DELAYED_RELEASE_TABLET | Freq: Every day | ORAL | 0 refills | Status: AC
  Filled 2024-08-04: qty 30, 30d supply, fill #0

## 2024-08-04 MED ORDER — MELOXICAM 15 MG PO TABS
15.0000 mg | ORAL_TABLET | Freq: Every day | ORAL | 0 refills | Status: DC
  Filled 2024-08-04: qty 30, 30d supply, fill #0

## 2024-08-04 MED ORDER — JARDIANCE 25 MG PO TABS
25.0000 mg | ORAL_TABLET | Freq: Every day | ORAL | 0 refills | Status: DC
  Filled 2024-08-04: qty 30, 30d supply, fill #0

## 2024-08-04 MED ORDER — ALBUTEROL SULFATE HFA 108 (90 BASE) MCG/ACT IN AERS
INHALATION_SPRAY | RESPIRATORY_TRACT | 0 refills | Status: DC
  Filled 2024-08-04: qty 6.7, 17d supply, fill #0

## 2024-08-04 MED ORDER — LANTUS SOLOSTAR 100 UNIT/ML ~~LOC~~ SOPN
25.0000 [IU] | PEN_INJECTOR | Freq: Every day | SUBCUTANEOUS | 0 refills | Status: DC
  Filled 2024-08-04: qty 15, 60d supply, fill #0

## 2024-08-04 MED ORDER — MOUNJARO 7.5 MG/0.5ML ~~LOC~~ SOAJ
7.5000 mg | SUBCUTANEOUS | 0 refills | Status: DC
  Filled 2024-08-04: qty 2, 28d supply, fill #0

## 2024-08-04 MED ORDER — VALSARTAN 80 MG PO TABS
80.0000 mg | ORAL_TABLET | Freq: Every day | ORAL | 0 refills | Status: DC
  Filled 2024-08-04: qty 30, 30d supply, fill #0

## 2024-08-04 MED ORDER — MONTELUKAST SODIUM 10 MG PO TABS
10.0000 mg | ORAL_TABLET | Freq: Every day | ORAL | 0 refills | Status: DC
  Filled 2024-08-04: qty 30, 30d supply, fill #0

## 2024-08-04 MED ORDER — GABAPENTIN 300 MG PO CAPS
300.0000 mg | ORAL_CAPSULE | Freq: Three times a day (TID) | ORAL | 0 refills | Status: DC
  Filled 2024-08-04: qty 90, 30d supply, fill #0

## 2024-08-04 MED ORDER — GLIPIZIDE 5 MG PO TABS
ORAL_TABLET | Freq: Two times a day (BID) | ORAL | 0 refills | Status: DC
  Filled 2024-08-04: qty 60, 30d supply, fill #0
  Filled 2024-08-06: qty 30, 30d supply, fill #0

## 2024-08-04 MED ORDER — METFORMIN HCL 500 MG PO TABS
500.0000 mg | ORAL_TABLET | Freq: Two times a day (BID) | ORAL | 0 refills | Status: DC
  Filled 2024-08-04: qty 60, 30d supply, fill #0

## 2024-08-04 MED ORDER — ALBUTEROL SULFATE (2.5 MG/3ML) 0.083% IN NEBU
3.0000 mL | INHALATION_SOLUTION | Freq: Three times a day (TID) | RESPIRATORY_TRACT | 0 refills | Status: DC
  Filled 2024-08-04: qty 270, 30d supply, fill #0

## 2024-08-05 ENCOUNTER — Other Ambulatory Visit: Payer: Self-pay

## 2024-08-05 ENCOUNTER — Telehealth: Payer: Self-pay

## 2024-08-05 ENCOUNTER — Other Ambulatory Visit: Payer: Self-pay | Admitting: Internal Medicine

## 2024-08-05 ENCOUNTER — Other Ambulatory Visit (HOSPITAL_COMMUNITY): Payer: Self-pay

## 2024-08-05 NOTE — Transitions of Care (Post Inpatient/ED Visit) (Signed)
 08/05/2024  Name: Yvonne Todd MRN: [id number] DOB: [birth year]  Today's TOC FU Call Status: Today's TOC FU Call Status:: Successful TOC FU Call Completed TOC FU Call Complete Date: 08/05/24  Patient's Name and Date of Birth confirmed. Name, DOB  Transition Care Management Follow-up Telephone Call Date of Discharge: 08/04/24 Discharge Facility: Other [facility]) Name of Other (Non[facility]) Discharge Facility: [facility] Type of Discharge: Inpatient Admission Primary Inpatient Discharge Diagnosis:: hypoxia How have you been since you were released from the hospital?: Better Any questions or concerns?: No  Items Reviewed: Did you receive and understand the discharge instructions provided?: Yes Medications obtained,verified, and reconciled?: Yes (Medications Reviewed) Any new allergies since your discharge?: No Dietary orders reviewed?: Yes Do you have support at home?: Yes People in Home [RPT]: sibling(s)  Medications Reviewed Today: Medications Reviewed Today     Reviewed by Emmitt Pan, LPN (Licensed Practical Nurse) on 08/05/24 at 0945  Med List Status: <None>   Medication Order Taking? Sig Documenting Provider Last Dose Status Informant  albuterol  (PROVENTIL ) (2.5 MG/3ML) 0.083% nebulizer solution [id number] Yes Inhale 3 mLs (2.5 mg total) by nebulization 3 (three) times daily.  Patient taking differently: Take 2.5 mg by nebulization 3 (three) times daily as needed for wheezing or shortness of breath.   Rollene Almarie LABOR, MD  Active Family Member  albuterol  (PROVENTIL ) (2.5 MG/3ML) 0.083% nebulizer solution [id number] Yes Inhale 3 ml using nebulizer three times a day   Active   albuterol  (VENTOLIN  HFA) 108 (90 Base) MCG/ACT inhaler [id number] Yes Inhale 1-2 puffs into the lungs every 4 (four) hours as needed for wheezing or shortness of breath. Rollene Almarie LABOR, MD  Active Family Member  albuterol  (VENTOLIN  HFA) 108 [phone] Base) MCG/ACT inhaler [id number] Yes  Inhale 2 puffs into the lungs every four hours as needed for wheezing and shortness of breath.   Active   aspirin  EC (ASPIRIN  81) 81 MG tablet [id number] Yes Take 1 tablet by mouth once a day   Active   aspirin  EC 81 MG tablet [id number]  Take 1 tablet (81 mg total) by mouth daily.  Patient not taking: Reported on 08/05/2024   Pearlean Manus, MD  Active Family Member  atorvastatin  (LIPITOR) 80 MG tablet [id number] Yes Take 1 tablet (80 mg total) by mouth at bedtime. Rollene Almarie LABOR, MD  Active Family Member  atorvastatin  (LIPITOR) 80 MG tablet [id number] Yes Take 1 tablet by mouth every night at bedtime   Active    Patient not taking:   Discontinued 02/21/20 1415   co-enzyme Q-10 30 MG capsule [id number] Yes Take 30 mg by mouth daily. [provider]  Active Family Member  Continuous Glucose Receiver WILMOT G7 [place]) [place] [id number] Yes To use with G7 sensors Rollene Almarie LABOR, MD  Active   Continuous Glucose Sensor (DEXCOM G7 [place]) [place] [id number] Yes Apply sensor every 10 days Rollene Almarie LABOR, MD  Active Multiple Informants  diphenhydramine -acetaminophen  (TYLENOL  PM) 25-500 MG TABS tablet [id number] Yes Take 2 tablets by mouth at bedtime as needed (for pain or sleep). [provider]  Active Family Member  empagliflozin  (JARDIANCE ) 25 MG TABS tablet [id number] Yes Take 1 tablet (25 mg total) by mouth daily. Rollene Almarie LABOR, MD  Active Family Member  empagliflozin  (JARDIANCE ) 25 MG TABS tablet [id number] Yes Take 1 tablet by mouth once a day   Active   famotidine  (PEPCID ) 40 MG tablet [id number] Yes Take 1 tablet (40 mg total) by mouth daily. Rollene Almarie LABOR, MD  Active   fluticasone  (FLONASE ) 50 MCG/ACT nasal spray [id number] Yes Place 2 sprays into both nostrils daily.  Patient taking differently: Place 2 sprays into both nostrils daily as needed for allergies or rhinitis.   Rollene Almarie LABOR, MD  Active Family Member  Fluticasone -Umeclidin-Vilant  (TRELEGY ELLIPTA ) 100-62.5-25 MCG/ACT AEPB [id number] Yes Inhale 1 puff into the lungs daily. Rollene Almarie LABOR, MD  Active Family Member  Fluticasone -Umeclidin-Vilant (TRELEGY ELLIPTA ) 100-62.5-25 MCG/ACT AEPB [id number] Yes Inhale 1 puff as directed once a day   Active   furosemide  (LASIX ) 20 MG tablet [id number] Yes Take 1-2 tablets (20-40 mg total) by mouth daily.  Patient taking differently: Take 20 mg by mouth See admin instructions. Take 20 mg by mouth in the morning and at 12 CURLY Rollene, Almarie LABOR, MD  Active Family Member  furosemide  (LASIX ) 20 MG tablet [id number] Yes Take 1 tablet by mouth once a day   Active   gabapentin  (NEURONTIN ) 300 MG capsule [id number] Yes Take 1 capsule (300 mg total) by mouth 3 (three) times daily. Rollene Almarie LABOR, MD  Active Family Member  gabapentin  (NEURONTIN ) 300 MG capsule [id number] Yes Take 1 capsule (300 mg total) by mouth 3 (three) times daily.   Active   glipiZIDE  (GLUCOTROL ) 5 MG tablet [id number] Yes Take 0.5 tablets (2.5 mg total) by mouth 2 (two) times daily before a meal. Rollene Almarie LABOR, MD  Active   glipiZIDE  (GLUCOTROL ) 5 MG tablet [id number] Yes Take 1/2 tablet by mouth 2 (two) times daily.   Active   insulin  aspart (NOVOLOG  FLEXPEN) 100 UNIT/ML FlexPen [id number] Yes 0-9 Units, Subcutaneous, 3 times daily with meals CBG < 70: Implement Hypoglycemia measures CBG 70 - 120: 0 units CBG 121 - 150: 1 unit CBG 151 - 200: 2 units CBG 201 - 250: 3 units CBG 251 - 300: 5 units CBG 301 - 350: 7 units CBG 351 - 400: 9 units CBG > 400: call MD Raenelle Donalda HERO, MD  Active   insulin  glargine (LANTUS  SOLOSTAR) 100 UNIT/ML Solostar Pen [id number] Yes Inject 25 Units into the skin daily. Rollene Almarie LABOR, MD  Active Family Member  insulin  glargine (LANTUS  SOLOSTAR) 100 UNIT/ML Solostar Pen [id number] Yes Inject 25 unit subcutaneously once a day   Active   Insulin  Pen Needle (INSUPEN PEN NEEDLES) 31G X 5 MM MISC [id number] Yes Use daily as  directed to inject lantus  Rollene Almarie LABOR, MD  Active    Patient not taking:   Discontinued 02/21/20 1415   meloxicam  (MOBIC ) 15 MG tablet [id number] Yes Take 1 tablet (15 mg total) by mouth daily. Rollene Almarie LABOR, MD  Active Family Member  meloxicam  (MOBIC ) 15 MG tablet [id number] Yes Take 1 tablet by mouth once a day   Active   metFORMIN  (GLUCOPHAGE ) 500 MG tablet [id number] Yes Take 1 tablet by mouth twice a day   Active   metFORMIN  (GLUCOPHAGE -XR) 500 MG 24 hr tablet [id number] Yes Take 1 tablet (500 mg total) by mouth 2 (two) times daily with a meal. Rollene Almarie LABOR, MD  Active Family Member  montelukast  (SINGULAIR ) 10 MG tablet [id number] Yes Take 1 tablet (10 mg total) by mouth at bedtime. Rollene Almarie LABOR, MD  Active Family Member  montelukast  (SINGULAIR ) 10 MG tablet [id number] Yes Take 1 tablet by mouth once a day   Active   Multiple Vitamins-Minerals (CENTRUM ULTRA WOMENS) TABS [id number] Yes Take 1 tablet by mouth daily with breakfast. [provider]  Active Family Member  OXYGEN  [id number] Yes Inhale 3-5 L/min into the lungs continuous. [provider]  Active Multiple Informants  Respiratory Therapy Supplies (NEBULIZER/TUBING/MOUTHPIECE) KIT [id number] Yes Use with nebulizer Rollene Almarie LABOR, MD  Active   tirzepatide  (MOUNJARO ) 7.5 MG/0.5ML Pen [id number] Yes Inject 7.5 mg into the skin once a week.  Patient taking differently: Inject 7.5 mg into the skin every Wednesday.   Rollene Almarie LABOR, MD  Active Multiple Informants  tirzepatide  (MOUNJARO ) 7.5 MG/0.5ML Pen [id number] Yes Inject 7.5 mg into the skin once a week.   Active   valsartan  (DIOVAN ) 80 MG tablet [id number] Yes Take 1 tablet (80 mg total) by mouth daily. Rollene Almarie LABOR, MD  Active Family Member  valsartan  (DIOVAN ) 80 MG tablet [id number] Yes Take 1 tablet by mouth once a day   Active             Home Care and Equipment/Supplies: Were Home Health Services Ordered?:  Yes Name of Home Health Agency:: unknown Has Agency set up a time to come to your home?: No Any new equipment or medical supplies ordered?: Yes Name of Medical supply agency?: unknown Were you able to get the equipment/medical supplies?: No Do you have any questions related to the use of the equipment/supplies?: No  Functional Questionnaire: Do you need assistance with bathing/showering or dressing?: No Do you need assistance with meal preparation?: No Do you need assistance with eating?: No Do you have difficulty maintaining continence: No Do you need assistance with getting out of bed/getting out of a chair/moving?: No Do you have difficulty managing or taking your medications?: No  Follow up appointments reviewed: PCP Follow-up appointment confirmed?: Yes Date of PCP follow-up appointment?: 08/09/24 Follow-up Provider: [facility] Follow-up appointment confirmed?: NA Do you need transportation to your follow-up appointment?: No Do you understand care options if your condition(s) worsen?: Yes-patient verbalized understanding    SIGNATURE Julian Lemmings, LPN [facility] Nurse Health Advisor Direct Dial [phone]

## 2024-08-06 ENCOUNTER — Other Ambulatory Visit (HOSPITAL_COMMUNITY): Payer: Self-pay

## 2024-08-08 ENCOUNTER — Other Ambulatory Visit (HOSPITAL_COMMUNITY): Payer: Self-pay

## 2024-08-09 ENCOUNTER — Inpatient Hospital Stay: Admitting: Internal Medicine

## 2024-08-09 ENCOUNTER — Ambulatory Visit: Payer: Self-pay

## 2024-08-09 NOTE — Telephone Encounter (Signed)
 FYI Only or Action Required?: Action required by provider: clinical question for provider and update on patient condition. Please call pt to f/u on 2 episodes of BG in the 60's for unknown reason. BG 124 now.  Patient was last seen in primary care on 02/26/2024 by Rollene Almarie LABOR, MD.  Called Nurse Triage reporting Blood Sugar Problem.  Symptoms began several days ago.  Interventions attempted: Prescription medications: Lantus .  Symptoms are: stable.  Triage Disposition: Call PCP Within 24 Hours  Patient/caregiver understands and will follow disposition?: Yes   Copied from CRM #[id number]. Topic: Clinical - Red Word Triage >> Aug 09, 2024  2:32 PM Brittany M wrote: Red Word that prompted transfer to Nurse Triage: Low blood sugar today reading at 60- ate a piece of chocolate and went up to 130. Reason for Disposition  [1] Blood glucose 70 mg/dL (3.9 mmol/L) or below OR symptomatic, now improved with Care Advice AND [2] cause unknown  Answer Assessment - Initial Assessment Questions Pt reports yesterday and today BG via CGM was in the 60's and was feeling shaky. Yesterday BG 64, ate a piece of chocolate, came up to 137. Pt took 25 units lantus  insulin  after that in the morning. Normally takes at night but took yesterday morning to bring BG down. This morning BG 60, ate bowl of turkey soup, BG increased to 113. BG currently 124, feels fine. Speaking in clear full coherent sentences. Takes Lantus  25 mg SQ at bedtime, Monjauro 7.5 mg weekly SQ injection, Glipizide  2.5 mg bid, metformin  500 mg bid and jardiance  25 mg daily. Has taken all meds for a while and has not missed or taken extra doses. Sending message to office to f/u with pt. Reviewed low BG instructions with pt and when to call EMS, verbalized clear understanding.  1. SYMPTOMS: What symptoms are you concerned about?     2x episode of blood glucose in the 60's yesterday and today  2. ONSET:  When did the symptoms start?      Yesterday  3. BLOOD GLUCOSE: What is your blood glucose level?      124 now  4. USUAL RANGE: What is your blood glucose level usually? (e.g., usual fasting morning value, usual evening value)     110-140's  5. TYPE 1 or 2:  Do you know what type of diabetes you have?  (e.g., Type 1, Type 2, Gestational; doesn't know)      Type 2  6. INSULIN : Do you take insulin ? What type of insulin (s) do you use? What is the mode of delivery? (syringe, pen; injection or pump) When did you last give yourself an insulin  dose? (i.e., time or hours/minutes ago) How much did you give? (i.e., how many units)     Lantus , 25 mg at bedtime, Monjauro 7.5 mg weekly SQ injection.   7. DIABETES PILLS: Do you take any pills for your diabetes? If Yes, ask: What is the name of the medicine(s) that you take for high blood sugar?     Glipizide  2.5 mg bid and metformin  500 mg bid and jardiance  25 mg daily  8. OTHER SYMPTOMS: Do you have any symptoms? (e.g., fever, frequent urination, difficulty breathing, vomiting)     Denies  9. LOW BLOOD GLUCOSE TREATMENT: What have you done so far to treat the low blood glucose level?     Had turkey soup this morning which brought BG up  10. FOOD: When did you last eat or drink?  This morning at 6 am  11. ALONE: Are you alone right now or is someone with you?        Alone. Sister is 10-15 minutes away.  Protocols used: Diabetes - Low Blood Sugar-A-AH

## 2024-08-16 ENCOUNTER — Telehealth: Payer: Self-pay

## 2024-08-16 NOTE — Telephone Encounter (Signed)
 Copied from CRM #[id number]. Topic: Clinical - Home Health Verbal Orders >> Aug 16, 2024  2:09 PM Laymon HERO wrote: Caller/Agency: [facility] Callback Number: [phone] Service Requested: Physical Therapy Frequency: 2x3 ; 1x3  Any new concerns about the patient? No

## 2024-08-16 NOTE — Telephone Encounter (Signed)
 Copied from CRM #[id number]. Topic: Clinical - Home Health Verbal Orders >> Aug 16, 2024  4:01 PM Pinkey ORN wrote: Caller/Agency: [facility]  Callback Number: [phone] (Secure Line)  Service Requested: Occupational Therapy Frequency: 1 x week for 1 week, hold for 1 week, 1 x week for 4 weeks  Any new concerns about the patient? No

## 2024-08-16 NOTE — Telephone Encounter (Signed)
 It sounds like both these episodes happened after taking insulin  in morning rather than typical evening. Has this recurred since resuming her normal lantus  in the evening? If so we will have her reduce to 23 units lantus .

## 2024-08-17 NOTE — Telephone Encounter (Signed)
 Pt stated she always has taken lantus  in the evening. Stated she takes Mounjaro  in the am once a week on Wednesday, Pt was advised by home health nurse to take insulin  around 9-10 pm. She has not had the low dips in BS since she tried this. Please advise if thre are any further recommendations.

## 2024-08-17 NOTE — Telephone Encounter (Unsigned)
 Copied from CRM #[id number]. Topic: General - Other >> Aug 17, 2024  8:40 AM Eva FALCON wrote: Reason for CRM: Marko, PT with [facility] was calling to check on status of Verbal Orders from yesterday. Looks like its just pending provider approval. Requesting call back [phone].

## 2024-08-17 NOTE — Telephone Encounter (Unsigned)
 Copied from CRM #[id number]. Topic: Clinical - Home Health Verbal Orders >> Aug 16, 2024  4:01 PM Pinkey ORN wrote: Caller/Agency: [facility]  Callback Number: [phone] (Secure Line)  Service Requested: Occupational Therapy Frequency: 1 x week for 1 week, hold for 1 week, 1 x week for 4 weeks  Any new concerns about the patient? No >> Aug 17, 2024 11:40 AM Deleta RAMAN wrote: Calling for an update regarding the request please contact michelle at [phone]. Aware of call back time

## 2024-08-18 NOTE — Telephone Encounter (Signed)
 Message sent to Dr. Rollene, awaiting response.

## 2024-08-19 ENCOUNTER — Telehealth: Payer: Self-pay

## 2024-08-19 NOTE — Telephone Encounter (Signed)
 Copied from CRM #[id number]. Topic: Clinical - Home Health Verbal Orders >> Aug 19, 2024  3:19 PM Kevelyn M wrote: Marko with [facility] calling because she is trying to obtain verbal orders for this patient but has been unsuccessful. Her counterparts have been calling all week to get a VO. Patient has not been seen this week. She needs to know something.  Call back: [phone]

## 2024-08-19 NOTE — Telephone Encounter (Signed)
 Ok to just monitor for now let us  know if more lows

## 2024-08-19 NOTE — Telephone Encounter (Signed)
 Would need visit within 30 days of start of service. I do not know that date so unknown if verbals can be given.

## 2024-08-22 NOTE — Telephone Encounter (Signed)
 Previous message has been left on VM concerning this matter.

## 2024-08-22 NOTE — Telephone Encounter (Signed)
 Pt stated that she continues to have moments of low BS. Pt has appt to see Dr. Rollene on 12/30. Advised pt to continue to monitor and if BS consistently drop to call office for an earlier appt. Pt verbalized understanding.

## 2024-08-22 NOTE — Telephone Encounter (Signed)
LVM to call office to discuss

## 2024-08-26 ENCOUNTER — Other Ambulatory Visit (HOSPITAL_COMMUNITY): Payer: Self-pay

## 2024-08-26 ENCOUNTER — Other Ambulatory Visit: Payer: Self-pay | Admitting: Internal Medicine

## 2024-08-26 DIAGNOSIS — E118 Type 2 diabetes mellitus with unspecified complications: Secondary | ICD-10-CM

## 2024-08-26 MED ORDER — METFORMIN HCL ER 500 MG PO TB24
500.0000 mg | ORAL_TABLET | Freq: Two times a day (BID) | ORAL | 0 refills | Status: AC
  Filled 2024-08-26 – 2024-08-31 (×4): qty 90, 45d supply, fill #0

## 2024-08-26 MED ORDER — ATORVASTATIN CALCIUM 80 MG PO TABS
80.0000 mg | ORAL_TABLET | Freq: Every day | ORAL | 0 refills | Status: AC
  Filled 2024-08-26 – 2024-08-31 (×4): qty 90, 90d supply, fill #0

## 2024-08-26 MED ORDER — MELOXICAM 15 MG PO TABS
15.0000 mg | ORAL_TABLET | Freq: Every day | ORAL | 0 refills | Status: AC
  Filled 2024-08-26 – 2024-08-31 (×4): qty 90, 90d supply, fill #0

## 2024-08-26 MED ORDER — GABAPENTIN 300 MG PO CAPS
300.0000 mg | ORAL_CAPSULE | Freq: Three times a day (TID) | ORAL | 0 refills | Status: AC
  Filled 2024-08-26 – 2024-09-02 (×4): qty 270, 90d supply, fill #0

## 2024-08-26 MED ORDER — FUROSEMIDE 20 MG PO TABS
20.0000 mg | ORAL_TABLET | Freq: Every day | ORAL | 3 refills | Status: AC
  Filled 2024-08-26 – 2024-08-31 (×4): qty 180, 90d supply, fill #0

## 2024-08-27 MED ORDER — GLIPIZIDE 5 MG PO TABS
2.5000 mg | ORAL_TABLET | Freq: Two times a day (BID) | ORAL | 0 refills | Status: DC
  Filled 2024-08-27: qty 30, fill #0

## 2024-08-29 ENCOUNTER — Other Ambulatory Visit: Payer: Self-pay

## 2024-08-29 ENCOUNTER — Other Ambulatory Visit (HOSPITAL_COMMUNITY): Payer: Self-pay

## 2024-08-30 ENCOUNTER — Ambulatory Visit (INDEPENDENT_AMBULATORY_CARE_PROVIDER_SITE_OTHER): Admitting: Internal Medicine

## 2024-08-30 ENCOUNTER — Other Ambulatory Visit (HOSPITAL_COMMUNITY): Payer: Self-pay

## 2024-08-30 ENCOUNTER — Encounter: Payer: Self-pay | Admitting: Internal Medicine

## 2024-08-30 ENCOUNTER — Other Ambulatory Visit: Payer: Self-pay

## 2024-08-30 VITALS — BP 132/62 | HR 72 | Temp 97.7°F | Ht 62.0 in | Wt 184.4 lb

## 2024-08-30 DIAGNOSIS — G9340 Encephalopathy, unspecified: Secondary | ICD-10-CM

## 2024-08-30 DIAGNOSIS — E11649 Type 2 diabetes mellitus with hypoglycemia without coma: Secondary | ICD-10-CM | POA: Diagnosis not present

## 2024-08-30 DIAGNOSIS — I5032 Chronic diastolic (congestive) heart failure: Secondary | ICD-10-CM | POA: Diagnosis not present

## 2024-08-30 DIAGNOSIS — E118 Type 2 diabetes mellitus with unspecified complications: Secondary | ICD-10-CM

## 2024-08-30 MED ORDER — GLIPIZIDE 5 MG PO TABS
2.5000 mg | ORAL_TABLET | Freq: Every day | ORAL | 1 refills | Status: AC
  Filled 2024-08-30 – 2024-09-14 (×2): qty 45, 90d supply, fill #0

## 2024-08-30 NOTE — Patient Instructions (Signed)
 We will reduce the glipizide  to 1/2 pill once a day.

## 2024-08-30 NOTE — Assessment & Plan Note (Signed)
 Morning hypoglycemia is likely due to the insulin  regimen and glipizide  use without meals. A1c is 6.7, indicating good glucose control. Continue Lantus  at 25 units nightly. Reduce glipizide  to half a tablet once daily with the largest meal. Monitor blood glucose levels, especially in the mornings. Consider reducing Lantus  to 24 units if morning hypoglycemia persists. Follow up 3 months.

## 2024-08-30 NOTE — Progress Notes (Signed)
 "  Subjective:   Patient ID: Yvonne Todd, female    DOB: 16-Aug-1951, 73 y.o.   MRN: [id number]  Discussed the use of AI scribe software for clinical note transcription with the patient, who gave verbal consent to proceed.  History of Present Illness Yvonne Todd is a 73 year old female with diabetes who presents for follow-up after hospitalization for encephalopathy and viral infection. She is accompanied by her sister, [name].  She was hospitalized for a week followed by a month in rehabilitation. During her hospitalization, she was diagnosed with encephalopathy, and a viral infection was later found in her spinal fluid. The diagnosis of encephalopathy was not communicated to her during her hospital stay, and she only became aware of it through her medical records. The viral infection was identified after she left the hospital, and it was not bacterial meningitis. She was treated with antibiotics during her hospital stay as a precaution.  Her blood sugar levels, previously well-controlled, became erratic during her illness. She experienced near unconsciousness at home, leading to her hospitalization. Her A1c was 6.7 in the hospital, and she continues to experience morning hypoglycemia with blood sugar levels as low as 48 to 54 mg/dL. She is currently on Lantus  at night and has a history of using mealtime insulin  during her hospital stay. She uses a continuous glucose monitor, which has been helpful despite some sensor malfunctions.  Her current medications include Jardiance  and glipizide , which she takes as half a pill twice a day. She sometimes does not take glipizide  with meals, which may contribute to her low blood sugar episodes.   She has lost 15 pounds since the summer, which she attributes to her recent illness and stay at the rehab facility.  PMH, [facility], social history reviewed and updated  Medication discharge reconciliation was done during visit and list updated as appropriate.  Review  of Systems  Constitutional:  Positive for appetite change and fatigue.  HENT: Negative.    Eyes: Negative.   Respiratory:  Negative for cough, chest tightness and shortness of breath.   Cardiovascular:  Negative for chest pain, palpitations and leg swelling.  Gastrointestinal:  Negative for abdominal distention, abdominal pain, constipation, diarrhea, nausea and vomiting.  Musculoskeletal: Negative.   Skin: Negative.   Neurological:  Positive for weakness.  Psychiatric/Behavioral: Negative.      Objective:  Physical Exam Constitutional:      Appearance: She is well-developed.  HENT:     Head: Normocephalic and atraumatic.  Cardiovascular:     Rate and Rhythm: Normal rate and regular rhythm.  Pulmonary:     Effort: Pulmonary effort is normal. No respiratory distress.     Breath sounds: Normal breath sounds. No wheezing or rales.     Comments: Oxygen  in place via nasal canula Abdominal:     General: Bowel sounds are normal. There is no distension.     Palpations: Abdomen is soft.     Tenderness: There is no abdominal tenderness.  Musculoskeletal:     Cervical back: Normal range of motion.  Skin:    General: Skin is warm and dry.  Neurological:     Mental Status: She is alert and oriented to person, place, and time.     Coordination: Coordination abnormal.     Comments: Rolator for ambulation     Vitals:   08/30/24 1037  BP: 132/62  Pulse: 72  Temp: 97.7 F (36.5 C)  TempSrc: Oral  SpO2: 97%  Weight: 184 lb 6.4 oz (  83.6 kg)  Height: 5' 2 (1.575 m)    Assessment and Plan Assessment & Plan Type 2 diabetes mellitus with hypoglycemia   Morning hypoglycemia is likely due to the insulin  regimen and glipizide  use without meals. A1c is 6.7, indicating good glucose control. Continue Lantus  at 25 units nightly. Reduce glipizide  to half a tablet once daily with the largest meal. Monitor blood glucose levels, especially in the mornings. Consider reducing Lantus  to 24 units if  morning hypoglycemia persists.  Encephalopathy Resolving with some residual weakness and using rollator successfully. She did have positive herpes zoster in csf which resulted after hospital stay and resolution of significant symptoms.    "

## 2024-08-30 NOTE — Assessment & Plan Note (Signed)
 Overall resolving and we talked about slow course with recovery of viral encephalitis.

## 2024-08-30 NOTE — Telephone Encounter (Signed)
 Ok for [employer] as long as start of service date is 11/31/25 or later.

## 2024-08-31 ENCOUNTER — Other Ambulatory Visit: Payer: Self-pay

## 2024-09-02 ENCOUNTER — Other Ambulatory Visit: Payer: Self-pay

## 2024-09-02 ENCOUNTER — Other Ambulatory Visit (HOSPITAL_COMMUNITY): Payer: Self-pay

## 2024-09-02 ENCOUNTER — Telehealth: Payer: Self-pay

## 2024-09-02 NOTE — Telephone Encounter (Signed)
 Message left for Yvonne Todd to call office.

## 2024-09-02 NOTE — Telephone Encounter (Signed)
 Copied from CRM #[id number]. Topic: Clinical - Home Health Verbal Orders >> Aug 31, 2024  1:23 PM Tinnie BROCKS wrote: Caller/Agency: monique, pt with [facility] Callback Number: [phone] Service Requested: Physical Therapy Frequency: 1 week 1 secondary to holiday Any new concerns about the patient? No

## 2024-09-02 NOTE — Telephone Encounter (Signed)
 Spoke with [employer]. Verbals were received.

## 2024-09-06 ENCOUNTER — Other Ambulatory Visit (HOSPITAL_COMMUNITY): Payer: Self-pay

## 2024-09-13 ENCOUNTER — Ambulatory Visit: Payer: Self-pay

## 2024-09-13 NOTE — Telephone Encounter (Signed)
 FYI Only or Action Required?: Action required by provider: clinical question for provider.  Patient was last seen in primary care on 08/30/2024 by Rollene Almarie LABOR, MD.  Called Nurse Triage reporting Medication Problem.  Symptoms began several days ago.  Interventions attempted: Nothing.  Symptoms are: stable.  Triage Disposition: Call PCP When Office is Open  Patient/caregiver understands and will follow disposition?: Yes  Reason for Disposition  [1] Caller has NON-URGENT medicine question about med that PCP prescribed AND [2] triager unable to answer question  Answer Assessment - Initial Assessment Questions 1. NAME of MEDICINE: What medicine(s) are you calling about?     Tirzepatide    2. QUESTION: What is your question? (e.g., double dose of medicine, side effect)     Patient states she had a low blood sugar reading last Thursday and would like to confirm she is ok to take her next dose tomorrow. She has not had any further issues and reports no current symptoms.   3. PRESCRIBER: Who prescribed the medicine? Reason: if prescribed by specialist, call should be referred to that group.     Almarie Rollene  4. SYMPTOMS: Do you have any symptoms? If Yes, ask: What symptoms are you having?  How bad are the symptoms (e.g., mild, moderate, severe)     Denies any symptoms  5. PREGNANCY:  Is there any chance that you are pregnant? When was your last menstrual period?     NA  Protocols used: Medication Question Call-A-AH  Reason for Triage: Patient has a question about tirzepatide  (MOUNJARO ) 7.5 MG/0.5ML Pen. Patient has had really low blood sugar. Just happened one time. It's never happened before. Her blood sugar was 48 was last Thursday. She is due to take mounjaro  tomorrow and is afraid to take it.   Call back # [phone]

## 2024-09-14 ENCOUNTER — Other Ambulatory Visit: Payer: Self-pay

## 2024-09-14 ENCOUNTER — Other Ambulatory Visit (HOSPITAL_BASED_OUTPATIENT_CLINIC_OR_DEPARTMENT_OTHER): Payer: Self-pay

## 2024-09-14 NOTE — Telephone Encounter (Signed)
 Pt is aware of instructions per Dr. Rollene. Will notify if she has any further low blood sugars.

## 2024-09-14 NOTE — Telephone Encounter (Signed)
 Ok to reduce lantus  to 22 units daily and wait extra 2 days for next mounjaro  shot then okay to take the shot. Let us  know if any further low sugars.

## 2024-09-18 ENCOUNTER — Other Ambulatory Visit (HOSPITAL_COMMUNITY): Payer: Self-pay

## 2024-09-19 ENCOUNTER — Telehealth: Payer: Self-pay

## 2024-09-19 NOTE — Telephone Encounter (Signed)
 Copied from CRM [id number]. Topic: Clinical - Home Health Verbal Orders >> Sep 19, 2024  2:44 PM Sasha M wrote: Caller/Agency: Monique/[facility] Callback Number: [phone], please call for verbal confirmation Service Requested: Physical Therapy Frequency: Extend for once a week for 3 more weeks Any new concerns about the patient? No

## 2024-09-20 ENCOUNTER — Other Ambulatory Visit: Payer: Self-pay | Admitting: Internal Medicine

## 2024-09-20 ENCOUNTER — Other Ambulatory Visit (HOSPITAL_COMMUNITY): Payer: Self-pay

## 2024-09-20 MED ORDER — FAMOTIDINE 40 MG PO TABS
40.0000 mg | ORAL_TABLET | Freq: Every day | ORAL | 0 refills | Status: AC
  Filled 2024-09-20: qty 90, 90d supply, fill #0

## 2024-09-20 NOTE — Telephone Encounter (Signed)
 Ok for [employer]

## 2024-09-21 ENCOUNTER — Other Ambulatory Visit: Payer: Self-pay

## 2024-09-22 NOTE — Telephone Encounter (Unsigned)
 Copied from CRM [id number]. Topic: General - Other >> Sep 21, 2024  4:51 PM Joesph NOVAK wrote: Reason for CRM: [facility] is calling for verbals, confirmed pcp ok'd

## 2024-09-23 ENCOUNTER — Other Ambulatory Visit (HOSPITAL_COMMUNITY): Payer: Self-pay

## 2024-09-30 ENCOUNTER — Other Ambulatory Visit (HOSPITAL_COMMUNITY): Payer: Self-pay

## 2024-09-30 MED ORDER — ONDANSETRON 8 MG PO TBDP
8.0000 mg | ORAL_TABLET | Freq: Three times a day (TID) | ORAL | 0 refills | Status: AC | PRN
  Filled 2024-09-30 – 2024-10-01 (×2): qty 12, 4d supply, fill #0

## 2024-10-01 ENCOUNTER — Other Ambulatory Visit (HOSPITAL_COMMUNITY): Payer: Self-pay

## 2024-10-01 ENCOUNTER — Encounter (HOSPITAL_COMMUNITY): Payer: Self-pay

## 2024-10-03 ENCOUNTER — Other Ambulatory Visit: Payer: Self-pay

## 2024-10-03 ENCOUNTER — Other Ambulatory Visit (HOSPITAL_COMMUNITY): Payer: Self-pay

## 2024-10-04 ENCOUNTER — Other Ambulatory Visit (HOSPITAL_COMMUNITY): Payer: Self-pay

## 2024-11-28 ENCOUNTER — Ambulatory Visit: Admitting: Internal Medicine

## 2025-07-05 ENCOUNTER — Ambulatory Visit

## 2025-07-05 ENCOUNTER — Encounter: Admitting: Internal Medicine
